# Patient Record
Sex: Female | Born: 1945 | Race: White | Hispanic: No | Smoking: Former smoker
Health system: Southern US, Community
[De-identification: ages and names within clinical notes are randomized; demographics above are authoritative.]

## PROBLEM LIST (undated history)

## (undated) DIAGNOSIS — R112 Nausea with vomiting, unspecified: Secondary | ICD-10-CM

## (undated) DIAGNOSIS — K5790 Diverticulosis of intestine, part unspecified, without perforation or abscess without bleeding: Secondary | ICD-10-CM

## (undated) DIAGNOSIS — W19XXXA Unspecified fall, initial encounter: Secondary | ICD-10-CM

## (undated) DIAGNOSIS — F32A Depression, unspecified: Secondary | ICD-10-CM

## (undated) DIAGNOSIS — N281 Cyst of kidney, acquired: Secondary | ICD-10-CM

## (undated) DIAGNOSIS — R911 Solitary pulmonary nodule: Secondary | ICD-10-CM

## (undated) DIAGNOSIS — R0600 Dyspnea, unspecified: Secondary | ICD-10-CM

## (undated) DIAGNOSIS — R296 Repeated falls: Secondary | ICD-10-CM

## (undated) DIAGNOSIS — Z8719 Personal history of other diseases of the digestive system: Secondary | ICD-10-CM

## (undated) DIAGNOSIS — M5137 Other intervertebral disc degeneration, lumbosacral region: Secondary | ICD-10-CM

## (undated) DIAGNOSIS — Z87442 Personal history of urinary calculi: Secondary | ICD-10-CM

## (undated) DIAGNOSIS — K219 Gastro-esophageal reflux disease without esophagitis: Secondary | ICD-10-CM

## (undated) DIAGNOSIS — I4719 Other supraventricular tachycardia: Secondary | ICD-10-CM

## (undated) DIAGNOSIS — G4733 Obstructive sleep apnea (adult) (pediatric): Secondary | ICD-10-CM

## (undated) DIAGNOSIS — M51379 Other intervertebral disc degeneration, lumbosacral region without mention of lumbar back pain or lower extremity pain: Secondary | ICD-10-CM

## (undated) DIAGNOSIS — I82409 Acute embolism and thrombosis of unspecified deep veins of unspecified lower extremity: Secondary | ICD-10-CM

## (undated) DIAGNOSIS — Z87412 Personal history of vulvar dysplasia: Secondary | ICD-10-CM

## (undated) DIAGNOSIS — Z8619 Personal history of other infectious and parasitic diseases: Secondary | ICD-10-CM

## (undated) DIAGNOSIS — C801 Malignant (primary) neoplasm, unspecified: Secondary | ICD-10-CM

## (undated) DIAGNOSIS — E559 Vitamin D deficiency, unspecified: Secondary | ICD-10-CM

## (undated) DIAGNOSIS — F329 Major depressive disorder, single episode, unspecified: Secondary | ICD-10-CM

## (undated) DIAGNOSIS — Z9889 Other specified postprocedural states: Secondary | ICD-10-CM

## (undated) DIAGNOSIS — I499 Cardiac arrhythmia, unspecified: Secondary | ICD-10-CM

## (undated) DIAGNOSIS — R569 Unspecified convulsions: Secondary | ICD-10-CM

## (undated) DIAGNOSIS — Z8669 Personal history of other diseases of the nervous system and sense organs: Secondary | ICD-10-CM

## (undated) DIAGNOSIS — I471 Supraventricular tachycardia: Secondary | ICD-10-CM

## (undated) DIAGNOSIS — K589 Irritable bowel syndrome without diarrhea: Secondary | ICD-10-CM

## (undated) DIAGNOSIS — M1990 Unspecified osteoarthritis, unspecified site: Secondary | ICD-10-CM

## (undated) DIAGNOSIS — Z8711 Personal history of peptic ulcer disease: Secondary | ICD-10-CM

## (undated) DIAGNOSIS — Q851 Tuberous sclerosis: Secondary | ICD-10-CM

## (undated) DIAGNOSIS — I10 Essential (primary) hypertension: Secondary | ICD-10-CM

## (undated) DIAGNOSIS — I491 Atrial premature depolarization: Secondary | ICD-10-CM

## (undated) HISTORY — DX: Malignant (primary) neoplasm, unspecified: C80.1

## (undated) HISTORY — DX: Personal history of urinary calculi: Z87.442

## (undated) HISTORY — DX: Supraventricular tachycardia: I47.1

## (undated) HISTORY — DX: Tuberous sclerosis: Q85.1

## (undated) HISTORY — PX: UPPER GASTROINTESTINAL ENDOSCOPY: SHX188

## (undated) HISTORY — DX: Other supraventricular tachycardia: I47.19

## (undated) HISTORY — PX: DILATION AND CURETTAGE OF UTERUS: SHX78

## (undated) HISTORY — DX: Gastro-esophageal reflux disease without esophagitis: K21.9

## (undated) HISTORY — PX: HEMORRHOID SURGERY: SHX153

## (undated) HISTORY — PX: COLONOSCOPY: SHX174

## (undated) HISTORY — DX: Diverticulosis of intestine, part unspecified, without perforation or abscess without bleeding: K57.90

## (undated) HISTORY — PX: CATARACT EXTRACTION W/ INTRAOCULAR LENS  IMPLANT, BILATERAL: SHX1307

## (undated) HISTORY — DX: Atrial premature depolarization: I49.1

## (undated) HISTORY — PX: GYNECOLOGIC CRYOSURGERY: SHX857

## (undated) HISTORY — PX: AUGMENTATION MAMMAPLASTY: SUR837

## (undated) HISTORY — DX: Unspecified osteoarthritis, unspecified site: M19.90

---
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
## 1973-06-03 HISTORY — PX: VAGINAL HYSTERECTOMY: SUR661

## 1983-06-04 HISTORY — PX: LUMBAR DISC SURGERY: SHX700

## 1989-06-03 HISTORY — PX: BLADDER SURGERY: SHX569

## 1997-10-17 ENCOUNTER — Other Ambulatory Visit: Admission: RE | Admit: 1997-10-17 | Discharge: 1997-10-17 | Payer: Self-pay | Admitting: Gynecology

## 1997-11-02 ENCOUNTER — Other Ambulatory Visit: Admission: RE | Admit: 1997-11-02 | Discharge: 1997-11-02 | Payer: Self-pay | Admitting: Gynecology

## 1997-11-02 ENCOUNTER — Emergency Department (HOSPITAL_COMMUNITY): Admission: EM | Admit: 1997-11-02 | Discharge: 1997-11-02 | Payer: Self-pay | Admitting: Emergency Medicine

## 1997-11-08 ENCOUNTER — Ambulatory Visit (HOSPITAL_COMMUNITY): Admission: RE | Admit: 1997-11-08 | Discharge: 1997-11-08 | Payer: Self-pay | Admitting: Internal Medicine

## 1997-12-22 ENCOUNTER — Emergency Department (HOSPITAL_COMMUNITY): Admission: EM | Admit: 1997-12-22 | Discharge: 1997-12-22 | Payer: Self-pay | Admitting: Emergency Medicine

## 1998-04-18 ENCOUNTER — Ambulatory Visit (HOSPITAL_COMMUNITY): Admission: RE | Admit: 1998-04-18 | Discharge: 1998-04-18 | Payer: Self-pay

## 1998-09-15 ENCOUNTER — Encounter: Payer: Self-pay | Admitting: Gynecology

## 1998-09-15 ENCOUNTER — Ambulatory Visit (HOSPITAL_COMMUNITY): Admission: RE | Admit: 1998-09-15 | Discharge: 1998-09-15 | Payer: Self-pay | Admitting: Gynecology

## 1998-10-02 ENCOUNTER — Encounter: Payer: Self-pay | Admitting: Gynecology

## 1998-10-02 ENCOUNTER — Ambulatory Visit (HOSPITAL_COMMUNITY): Admission: RE | Admit: 1998-10-02 | Discharge: 1998-10-02 | Payer: Self-pay | Admitting: Gynecology

## 1999-07-09 ENCOUNTER — Encounter: Payer: Self-pay | Admitting: Emergency Medicine

## 1999-07-09 ENCOUNTER — Inpatient Hospital Stay (HOSPITAL_COMMUNITY): Admission: EM | Admit: 1999-07-09 | Discharge: 1999-07-10 | Payer: Self-pay | Admitting: Emergency Medicine

## 1999-07-10 ENCOUNTER — Encounter (HOSPITAL_BASED_OUTPATIENT_CLINIC_OR_DEPARTMENT_OTHER): Payer: Self-pay | Admitting: Internal Medicine

## 1999-10-15 ENCOUNTER — Ambulatory Visit (HOSPITAL_COMMUNITY): Admission: RE | Admit: 1999-10-15 | Discharge: 1999-10-15 | Payer: Self-pay | Admitting: Internal Medicine

## 1999-10-15 ENCOUNTER — Encounter (HOSPITAL_BASED_OUTPATIENT_CLINIC_OR_DEPARTMENT_OTHER): Payer: Self-pay | Admitting: Internal Medicine

## 2000-12-30 ENCOUNTER — Ambulatory Visit (HOSPITAL_COMMUNITY): Admission: RE | Admit: 2000-12-30 | Discharge: 2000-12-30 | Payer: Self-pay | Admitting: Gynecology

## 2000-12-30 ENCOUNTER — Encounter: Payer: Self-pay | Admitting: Gynecology

## 2001-04-22 ENCOUNTER — Ambulatory Visit (HOSPITAL_COMMUNITY): Admission: RE | Admit: 2001-04-22 | Discharge: 2001-04-22 | Payer: Self-pay | Admitting: Neurology

## 2001-04-22 ENCOUNTER — Encounter: Payer: Self-pay | Admitting: Neurology

## 2001-06-17 ENCOUNTER — Ambulatory Visit (HOSPITAL_COMMUNITY): Admission: RE | Admit: 2001-06-17 | Discharge: 2001-06-17 | Payer: Self-pay | Admitting: Cardiology

## 2001-06-17 ENCOUNTER — Encounter: Payer: Self-pay | Admitting: Cardiology

## 2001-06-26 ENCOUNTER — Ambulatory Visit (HOSPITAL_COMMUNITY): Admission: RE | Admit: 2001-06-26 | Discharge: 2001-06-26 | Payer: Self-pay | Admitting: Cardiology

## 2001-06-26 HISTORY — PX: CARDIAC CATHETERIZATION: SHX172

## 2001-08-13 ENCOUNTER — Other Ambulatory Visit: Admission: RE | Admit: 2001-08-13 | Discharge: 2001-08-13 | Payer: Self-pay | Admitting: Gynecology

## 2001-11-03 ENCOUNTER — Encounter: Payer: Self-pay | Admitting: Internal Medicine

## 2001-11-03 ENCOUNTER — Encounter: Admission: RE | Admit: 2001-11-03 | Discharge: 2001-11-03 | Payer: Self-pay | Admitting: Internal Medicine

## 2002-09-27 ENCOUNTER — Other Ambulatory Visit: Admission: RE | Admit: 2002-09-27 | Discharge: 2002-09-27 | Payer: Self-pay | Admitting: Internal Medicine

## 2003-05-11 ENCOUNTER — Encounter: Admission: RE | Admit: 2003-05-11 | Discharge: 2003-05-11 | Payer: Self-pay | Admitting: Internal Medicine

## 2003-07-19 ENCOUNTER — Ambulatory Visit (HOSPITAL_COMMUNITY): Admission: RE | Admit: 2003-07-19 | Discharge: 2003-07-19 | Payer: Self-pay | Admitting: Gastroenterology

## 2003-08-18 ENCOUNTER — Encounter: Admission: RE | Admit: 2003-08-18 | Discharge: 2003-08-18 | Payer: Self-pay | Admitting: Gastroenterology

## 2003-09-02 ENCOUNTER — Encounter: Admission: RE | Admit: 2003-09-02 | Discharge: 2003-09-02 | Payer: Self-pay | Admitting: Gastroenterology

## 2003-09-05 ENCOUNTER — Encounter (INDEPENDENT_AMBULATORY_CARE_PROVIDER_SITE_OTHER): Payer: Self-pay | Admitting: *Deleted

## 2003-09-05 ENCOUNTER — Ambulatory Visit (HOSPITAL_COMMUNITY): Admission: RE | Admit: 2003-09-05 | Discharge: 2003-09-05 | Payer: Self-pay | Admitting: Gastroenterology

## 2003-11-07 ENCOUNTER — Encounter (INDEPENDENT_AMBULATORY_CARE_PROVIDER_SITE_OTHER): Payer: Self-pay | Admitting: *Deleted

## 2003-11-07 ENCOUNTER — Ambulatory Visit (HOSPITAL_COMMUNITY): Admission: RE | Admit: 2003-11-07 | Discharge: 2003-11-07 | Payer: Self-pay | Admitting: Gastroenterology

## 2003-11-18 ENCOUNTER — Observation Stay (HOSPITAL_COMMUNITY): Admission: RE | Admit: 2003-11-18 | Discharge: 2003-11-19 | Payer: Self-pay | Admitting: Surgery

## 2003-11-18 ENCOUNTER — Encounter (INDEPENDENT_AMBULATORY_CARE_PROVIDER_SITE_OTHER): Payer: Self-pay | Admitting: Specialist

## 2003-11-18 HISTORY — PX: CHOLECYSTECTOMY: SHX55

## 2003-12-13 ENCOUNTER — Other Ambulatory Visit: Admission: RE | Admit: 2003-12-13 | Discharge: 2003-12-13 | Payer: Self-pay | Admitting: Gynecology

## 2003-12-18 ENCOUNTER — Inpatient Hospital Stay (HOSPITAL_COMMUNITY): Admission: EM | Admit: 2003-12-18 | Discharge: 2003-12-21 | Payer: Self-pay | Admitting: Emergency Medicine

## 2003-12-18 HISTORY — PX: OTHER SURGICAL HISTORY: SHX169

## 2004-07-24 ENCOUNTER — Encounter: Admission: RE | Admit: 2004-07-24 | Discharge: 2004-07-24 | Payer: Self-pay | Admitting: Gynecology

## 2007-08-25 ENCOUNTER — Ambulatory Visit: Payer: Self-pay | Admitting: Cardiovascular Disease

## 2007-09-07 ENCOUNTER — Ambulatory Visit: Payer: Self-pay

## 2007-09-07 ENCOUNTER — Ambulatory Visit: Payer: Self-pay | Admitting: Internal Medicine

## 2007-09-07 ENCOUNTER — Encounter: Payer: Self-pay | Admitting: Cardiovascular Disease

## 2008-05-16 ENCOUNTER — Ambulatory Visit: Payer: Self-pay | Admitting: Gynecology

## 2008-05-16 ENCOUNTER — Encounter: Payer: Self-pay | Admitting: Gynecology

## 2008-05-16 ENCOUNTER — Other Ambulatory Visit: Admission: RE | Admit: 2008-05-16 | Discharge: 2008-05-16 | Payer: Self-pay | Admitting: Gynecology

## 2008-05-25 ENCOUNTER — Ambulatory Visit: Payer: Self-pay | Admitting: Gynecology

## 2008-06-07 ENCOUNTER — Ambulatory Visit: Payer: Self-pay | Admitting: Gynecology

## 2008-08-25 ENCOUNTER — Ambulatory Visit: Payer: Self-pay | Admitting: Gynecology

## 2008-10-28 ENCOUNTER — Emergency Department (HOSPITAL_COMMUNITY): Admission: EM | Admit: 2008-10-28 | Discharge: 2008-10-28 | Payer: Self-pay | Admitting: Emergency Medicine

## 2009-03-02 ENCOUNTER — Ambulatory Visit: Payer: Self-pay | Admitting: Gynecology

## 2009-03-07 ENCOUNTER — Ambulatory Visit: Payer: Self-pay | Admitting: Gynecology

## 2009-03-13 ENCOUNTER — Ambulatory Visit: Payer: Self-pay | Admitting: Gynecology

## 2009-03-17 ENCOUNTER — Ambulatory Visit: Payer: Self-pay | Admitting: Gynecology

## 2009-03-30 ENCOUNTER — Ambulatory Visit: Payer: Self-pay | Admitting: Gynecology

## 2009-03-30 ENCOUNTER — Other Ambulatory Visit: Admission: RE | Admit: 2009-03-30 | Discharge: 2009-03-30 | Payer: Self-pay | Admitting: Gynecology

## 2009-03-30 ENCOUNTER — Encounter: Payer: Self-pay | Admitting: Gynecology

## 2009-04-05 ENCOUNTER — Ambulatory Visit: Payer: Self-pay | Admitting: Gynecology

## 2009-04-06 ENCOUNTER — Encounter: Payer: Self-pay | Admitting: Gynecology

## 2009-04-06 ENCOUNTER — Ambulatory Visit (HOSPITAL_BASED_OUTPATIENT_CLINIC_OR_DEPARTMENT_OTHER): Admission: RE | Admit: 2009-04-06 | Discharge: 2009-04-06 | Payer: Self-pay | Admitting: Gynecology

## 2009-04-06 ENCOUNTER — Ambulatory Visit: Payer: Self-pay | Admitting: Gynecology

## 2009-04-06 HISTORY — PX: OTHER SURGICAL HISTORY: SHX169

## 2009-04-13 ENCOUNTER — Ambulatory Visit: Payer: Self-pay | Admitting: Gynecology

## 2009-05-15 ENCOUNTER — Ambulatory Visit: Payer: Self-pay | Admitting: Gynecology

## 2009-05-23 ENCOUNTER — Ambulatory Visit: Payer: Self-pay | Admitting: Gynecology

## 2009-06-12 ENCOUNTER — Ambulatory Visit: Payer: Self-pay | Admitting: Gynecology

## 2009-08-23 ENCOUNTER — Ambulatory Visit: Payer: Self-pay | Admitting: Gynecology

## 2009-08-30 ENCOUNTER — Ambulatory Visit: Payer: Self-pay | Admitting: Gynecology

## 2009-12-12 ENCOUNTER — Ambulatory Visit: Payer: Self-pay | Admitting: Gynecology

## 2009-12-21 ENCOUNTER — Ambulatory Visit: Payer: Self-pay | Admitting: Gynecology

## 2010-03-16 ENCOUNTER — Other Ambulatory Visit: Admission: RE | Admit: 2010-03-16 | Discharge: 2010-03-16 | Payer: Self-pay | Admitting: Gynecology

## 2010-03-16 ENCOUNTER — Ambulatory Visit: Payer: Self-pay | Admitting: Gynecology

## 2010-05-23 ENCOUNTER — Ambulatory Visit: Payer: Self-pay | Admitting: Gynecology

## 2010-07-05 ENCOUNTER — Ambulatory Visit (INDEPENDENT_AMBULATORY_CARE_PROVIDER_SITE_OTHER): Payer: Self-pay | Admitting: Gynecology

## 2010-07-05 DIAGNOSIS — R5383 Other fatigue: Secondary | ICD-10-CM

## 2010-07-05 DIAGNOSIS — N766 Ulceration of vulva: Secondary | ICD-10-CM

## 2010-07-05 DIAGNOSIS — R5381 Other malaise: Secondary | ICD-10-CM

## 2010-09-05 LAB — GLUCOSE, CAPILLARY: Glucose-Capillary: 123 mg/dL — ABNORMAL HIGH (ref 70–99)

## 2010-09-11 LAB — CBC
MCHC: 34.6 g/dL (ref 30.0–36.0)
Platelets: 191 10*3/uL (ref 150–400)
WBC: 7.9 10*3/uL (ref 4.0–10.5)

## 2010-09-11 LAB — DIFFERENTIAL
Basophils Relative: 0 % (ref 0–1)
Eosinophils Absolute: 0.3 10*3/uL (ref 0.0–0.7)
Lymphocytes Relative: 24 % (ref 12–46)
Lymphs Abs: 1.9 10*3/uL (ref 0.7–4.0)
Neutro Abs: 5.1 10*3/uL (ref 1.7–7.7)
Neutrophils Relative %: 65 % (ref 43–77)

## 2010-09-11 LAB — COMPREHENSIVE METABOLIC PANEL
ALT: 19 U/L (ref 0–35)
Albumin: 3.5 g/dL (ref 3.5–5.2)
Glucose, Bld: 91 mg/dL (ref 70–99)
Potassium: 4.4 mEq/L (ref 3.5–5.1)
Total Protein: 6.4 g/dL (ref 6.0–8.3)

## 2010-09-11 LAB — ACETAMINOPHEN LEVEL: Acetaminophen (Tylenol), Serum: 36.6 ug/mL — ABNORMAL HIGH (ref 10–30)

## 2010-09-11 LAB — POCT CARDIAC MARKERS

## 2010-10-16 NOTE — Assessment & Plan Note (Signed)
[facility]                            CARDIOLOGY OFFICE NOTE   NAME:[name], [name]                     MRN:          [id number]  DATE:08/25/2007                            DOB:          1945-07-25    Ms. [name] is a pleasant 65 year old patient referred by Dr. [name]  for chest pain and dyspnea.   The patient has had increasing chest pain and dyspnea over the last  month or so. She was recently seen in the ER. She ruled out for  myocardial infarction and had a normal chest x-ray.   Her pain is atypical.  It is constant.  It is a sharp pain near the  subxiphoid process. It can radiate around to the left breast. Her  dyspnea seems functional.  There has been no pleuritic component.  No  history of DVT, no pneumonia or fever.   She is concerned about her diagnosis of tuberosclerosis and possible  heart failure in the family.  She has not had any previous cardiac  problems or fluid overload. There has been no palpitations.   She feels that both her chest pain and dyspnea have been progressive.  There is nothing she can do to make them better.   There is no associated diaphoresis, syncope or palpitations.   The patient's review of systems is otherwise negative.   PAST MEDICAL HISTORY:  Remarkable for tuberosclerosis.  This seems to  have primarily involved external lesions, particularly in the hands.  Hysterectomy, bladder resection, lower back surgery in 1985.   She has some lower extremity edema and is on diuretics.   The patient's past medical history is otherwise remarkable for a history  of reflux, seasonal allergies and some chronic fatigue.   Mother is alive at age 19.  Father died at age 93 in a hunting accident.   The patient is allergic to SULFA. Her only medications include  alprazolam 0.05 mg 3 times a day and triamterene hydrochlorothiazide  37.5/25.   The patient is married with two children.  She had previously smoked  but  has not done so since 1998.  She thinks two cups of coffee a day.  She  does some walking but is fairly sedentary.  She does not drink.   PHYSICAL EXAMINATION:  Remarkable for a somewhat anxious white female in  no distress.  Her blood pressure is 130/80, pulse is 63 and regular, respiratory rate  14, afebrile.  HEENT:  Unremarkable.  Carotids are normal without bruit, no  lymphadenopathy, thyromegaly or JVP elevation.  LUNGS:  Clear, good diaphragmatic motion.  No wheezing.  S1, S2 with normal heart sounds. PMI normal.  ABDOMEN:  Protuberant.  There is mild left upper quadrant pain. No  rebound, no bruit, no AAA, no hepatosplenomegaly or hepatojugular  reflux. Distal pulses are intact with trace edema.  NEUROLOGIC:  Nonfocal.  SKIN:  Warm and dry.   EKG is normal.   IMPRESSION:  1. Atypical chest pain. Low likelihood of cardiac disease.  The      patient has had a previous right malleolar fracture. Will follow up  with an adenosine Myoview.  2. Epigastric type pain.  The patient has tuberosclerosis. I think it      may be important to do a chest and abdominal CT to make sure there      is no internal lesions that could be causing some of her pain.      Since her pain seems to spread from the epigastric area to the      chest, we will do both a noncontrast chest and abdominal CT.  3. Lower extremity edema currently stable, low-salt diet.  Continue      current dose of triamterene/hydrochlorothiazide.  4. Anxiety.  Continue alprazolam t.i.d. per Dr. [name].  5. Dyspnea appears functional. Lung exam is clear.  Chest x-ray in the      ER was normal.  CT the chest will rule out other interstitial lung      problems. A 2-D echocardiogram to assess for RV and LV function and      rule out pulmonary hypertension.   So long as the patient's echo and Myoview are normal, she will followup  with Dr. [name] for further follow-up of her tuberosclerosis.     Noralyn Pick. Eden Emms,  MD, [facility]  Electronically Signed    PCN/MedQ  DD: 08/25/2007  DT: 08/25/2007  Job #: [id number]   cc:   [name]. [name], M.D.

## 2010-10-19 NOTE — Cardiovascular Report (Signed)
[place]  Patient:    [name], [name] Visit Number: [id number] MRN: [id number]          Service Type: CAT Location: [place] 2856 01 Attending Physician:  Norman Clay Dictated by:   Darden Palmer., M.D. Proc. Date: 06/26/01 Admit Date:  06/26/2001   CC:         [name]. [name], M.D.   Cardiac Catheterization  HISTORY: The patient is a 65 year old female, who has had severe chest discomfort and exertional dyspnea with significant cardiac anxiety. She has a previous history of poor exercise tolerance and an abnormal treadmill test.  COMMENTS ABOUT PROCEDURE: The patient tolerated the procedure well without complications. Following the procedure, she had good hemostasis and peripheral pulses noted.  HEMODYNAMIC DATA: Aorta post contrast 127/67, LV post contrast 127/11-16.  ANGIOGRAPHIC DATA:  LEFT VENTRICULOGRAM: The left ventriculogram performed in the 30-degree RAO projection.  The aortic valve was normal.  The mitral valve was normal.  The left ventricle appears normal in size.  Estimated ejection fraction was 65-70%. Coronary arteries arise and distribute normally. There is no significant calcification noted.  Left main coronary artery: Normal.  Left anterior descending: Normal and gives rise to two diagonal and septal perforators and extends to the apex.  Intermediate branch: There is a moderate to large size vessel extending to the apex and appears normal.  Circumflex coronary artery: Dominant vessel giving rise to two major marginal vessels and ends in a posterior descending and posterolateral branch and is normal.  Right coronary artery: This is a small nondominant vessel supplying mainly right ventricular branches and appears normal.  IMPRESSION: 1. Normal left ventricular function with normal left ventricular end-diastolic    pressure. 2. Normal coronary arteries with a left dominant  circulation. Dictated by:   Darden Palmer., M.D. Attending Physician:  Norman Clay DD:  06/26/01 TD:  06/26/01 Job: [id number]

## 2010-10-19 NOTE — Op Note (Signed)
NAME:  [name], [name]                        ACCOUNT NO.:  [id number]   MEDICAL RECORD NO.:  [id number]                   PATIENT TYPE:  AMB   LOCATION:  DAY                                  FACILITY:  [facility]   PHYSICIAN:  Abigail Miyamoto, M.D.              DATE OF BIRTH:  [birth year]   DATE OF PROCEDURE:  11/18/2003  DATE OF DISCHARGE:                                 OPERATIVE REPORT   PREOPERATIVE DIAGNOSES:  Biliary dyskinesia.   POSTOPERATIVE DIAGNOSES:  Biliary dyskinesia.   PROCEDURE:  Laparoscopic cholecystectomy with intraoperative cholangiogram.   SURGEON:  Abigail Miyamoto, M.D.   ASSISTANT:  Sharlet Salina T. Hoxworth, M.D.   ANESTHESIA:  General endotracheal anesthesia.   ESTIMATED BLOOD LOSS:  Minimal.   FINDINGS:  The patient was found to have a normal cholangiogram. The  gallbladder was chronically scarred in appearance.   DESCRIPTION OF PROCEDURE:  The patient was brought to the operating room,  identified as [name]. She was placed supine on the operating table  and general anesthesia was induced. Her abdomen was then prepped and draped  in the usual sterile fashion.  Using a #15 blade, a small transverse  incision was made below the umbilicus through a previous scar.  The incision  was carried down through the fascia which was then opened with the scalpel,  a hemostat was then used to pass through the peritoneal cavity. A #0 Vicryl  pursestring suture was then placed around the fascial opening.  The Hasson  port was placed through the opening and insufflation of the abdomen was  begun. A 12 mm port was then placed in the patient's epigastrium and two 5  mm ports were placed in the patient's right flank under direct vision. The  gallbladder was then identified and retracted above the liver bed. Several  adhesions to the gallbladder were then taken down bluntly. The cystic duct  and cystic artery were then easily dissected out. A critical window was  achieved around the cystic duct. The artery was clipped twice proximally,  once distally and transected. The duct was then clipped once distally and  partly opened with the laparoscopic scissors. A cholangiocatheter was  inserted under direct vision in the right upper quadrant. The  cholangiocatheter was passed through this and placed into the opening in the  cystic duct. A cholangiogram was then performed under direct fluoroscopy.  Contrast was seen to flow easily into the common bile duct, the entire  biliary system and duodenum without evidence of obstruction or abnormality.  The Cholangiocath was then completely removed. The cystic duct was clipped  three times proximally and completely transected.  The gallbladder was then  slowly dissected free from the liver bed with the electrocautery. Once it  was free from the liver bed, it was removed through the incision at the  umbilicus. The liver bed was gain examined and hemostasis felt to be  achieved. The #0  Vicryl at the umbilicus was then tied in place closing the  fascia defect after the abdomen was irrigated with saline. All ports were  removed under direct vision and the abdomen was deflated.  All incisions  were anesthetized with 0.25% Marcaine and closed with  4-0 Monocryl subcuticular sutures. Steri-Strips, gauze and tape were then  applied. The patient tolerated the procedure well. All  sponge, needle and  instrument counts were correct at the end of the procedure. The patient was  then extubated in the operating room and taken in stable condition to the  recovery room.                                               Abigail Miyamoto, M.D.    DB/MEDQ  D:  11/18/2003  T:  11/18/2003  Job:  [id number]   cc:   Anselmo Rod, M.D.  [address]  [address]  [place]  [place] [address]  Fax: [phone]

## 2010-10-19 NOTE — Op Note (Signed)
NAME:  [name], [name]                        ACCOUNT NO.:  [id number]   MEDICAL RECORD NO.:  [id number]                   PATIENT TYPE:  AMB   LOCATION:  ENDO                                 FACILITY:  [facility]   PHYSICIAN:  Anselmo Rod, M.D.               DATE OF BIRTH:  12/17/1945   DATE OF PROCEDURE:  09/05/2003  DATE OF DISCHARGE:                                 OPERATIVE REPORT   PROCEDURE PERFORMED:  Esophagogastroduodenoscopy.   ENDOSCOPIST:  Charna Elizabeth, M.D.   INSTRUMENT USED:  Olympus video panendoscope.   INDICATIONS FOR PROCEDURE:  The patient is a 65 year old white female with a  history of tubular sclerosis with severe abdominal pain, nausea and  vomiting.  Rule out peptic ulcer disease, esophagitis, gastritis, etc.   PREPROCEDURE PREPARATION:  Informed consent was procured from the patient.  The patient was fasted for eight hours prior to the procedure.   PREPROCEDURE PHYSICAL:  The patient had stable vital signs.  Neck supple,  chest clear to auscultation.  S1, S2 regular.  Abdomen soft with normal  bowel sounds.   DESCRIPTION OF PROCEDURE:  The patient was placed in the left lateral  decubitus position and sedated with Demerol and Versed.  Once the patient  was adequately sedated and maintained on low-flow oxygen and continuous  cardiac monitoring, the Olympus video panendoscope was advanced through the  mouth piece over the tongue into the esophagus under direct vision.  The  entire esophagus appeared normal with no evidence of ring, stricture,  masses, esophagitis or Barrett's mucosa.  The scope was then advanced to the  stomach.  The entire gastric mucosa appeared normal.  Some nodules were seen  in the duodenal bulb.  These may represent lymphoid hyperplasia; however,  they did not seem typical of lymphoid hyperplasia and therefore biopsies  were done for pathology.  Small bowel distal to the bulb up to 60 cm  appeared normal.  There was no outlet  obstruction.  The patient tolerated  the procedure well without complication.  Retroflexion in the high cardia  revealed no abnormalities, no ulcers, erosions, masses or polyps were seen.   IMPRESSION:  Small nodular lesion in duodenal bulb biopsied for pathology.  Otherwise normal esophagogastroduodenoscopy.   RECOMMENDATIONS:  1. Await pathology results.  2. Outpatient followup in the next two weeks for further recommendations.                                               Anselmo Rod, M.D.    JNM/MEDQ  D:  09/05/2003  T:  09/06/2003  Job:  [id number]   cc:   [name]. [name], M.D.  [address]  [place]  [place] [address]  Fax: [phone]

## 2010-10-19 NOTE — Consult Note (Signed)
[place]  Patient:    [name]                   MRN: [id number] Proc. Date: 07/09/99 Adm. Date:  [id number] Disc. Date: [id number] Attending:  Garlan Fillers CC:         Dossie Arbour, M.D., Medicine                          Consultation Report  Thank you for asking me to see this 65 year old female for evaluation of chest discomfort.  She is a very difficult historian.  She has a long-standing history of fibromyalgia and anxiety, some depression.  She has had episodic chest discomfort over the years.  In 1991, she had chest discomfort that she was taken to the emergency room at [facility] with.  It was associated with vomiting and then severe diffuse anterior chest pain.  She later had some chest discomfort, which was progressive over the years and in 1998, had a treadmill test, for which she only went 4 minutes and had a millimeter of rapid ______ in ST segment depression. he was seen by Dr. Nicki Guadalajara and underwent cardiac catheterization at [facility] with findings of normal coronary arteries and normal left ventricular function.  She takes Vicodin for pain that she describes as fibromyalgia.  She as been obese and gets little in the way of exercise.  She states that she has had episodic dyspnea for a couple of days now.  She developed right arm pain, which hurt the entirety of her right arm throughout the evening last night.  She went to work this morning complaining of some vague right arm pain and while eating lunch, had the onset of anterior substernal chest discomfort that she describes as an elephant sitting on her chest associated with moderate nausea.  She went home and was brought here by her husband and was given a nitroglycerin, which she said relieved the pain.   EKG was normal during chest pain.  She complains of vague right arm at this time.  PAST MEDICAL HISTORY:  Medical:  No history of  hypertension or diabetes.  She thinks her cholesterol is low.  PAST SURGICAL HISTORY: 1. Hysterectomy. 2. Hemorrhoidectomy. 3. Back surgery x 2. 4. Bladder reconstruction.  ALLERGIES:  SULFA.  CURRENT MEDICATIONS: 1. Effexor. 2. Vicodin. 3. Some sort of a sleeping pill. 4. Prevacid p.r.n.  FAMILY HISTORY:  Her father died of suicide.  Mother reportedly had an myocardial infarction at age 35.  She has a history of heart disease on her mothers side of the family.  SOCIAL HISTORY:  She currently works in Clinical biochemist at [employer].  This is her  second marriage.  She has been married for 10 years to her current husband, who has had an amputation because of diabetes and smoking.  She smoked remotely in the past, but quit greater than five years ago.  Does not use alcohol to excess.  REVIEW OF SYSTEMS:  Obese for many years.  She has gained significant amounts of weight.  No ENT problems.  She complains of occasional dysphagia.  She has been  told that she has not had an ulcer in the past by Dr. Virginia Rochester.  She has occasional urinary incontinence.  She had epilepsy previously.  She has pain involving her  feet and her arms for which she will take Vicodin, which has been attributed to  fibromyalgia.  PHYSICAL EXAMINATION:  On examination, she is a pale, obese female who is currently in no acute distress.  VITAL SIGNS:  Blood pressure currently 130/80, pulse 70.  Skin:  Warm and dry.  ENT:  Normal. Pharynx is negative.  Neck:  Supple without masses.  No thyromegaly or carotid bruits.  Lungs:  Clear to auscultation and percussion.  Cardiovascular:  Normal S1, S2, no S3, no S4.  Abdomen:  Soft and nontender.  Extremities:  Femoral pulses are 2+ and pedal pulses are present and are 2+. There is no edema noted.  12-lead ECG is within normal limits.  Chest x-ray is normal.  Lab data shows normal PT and PTT.  Chemistry panel shows a sodium 137, potassium 4.1, glucose  118. CPK was normal with a normal MB and troponin was 0.04.   Hemoglobin 14.3, hematocrit 41.  IMPRESSION: 1. Prolonged episode of chest discomfort associated with nausea in a patient who had a normal cath two years ago.  Her EKG was normal during pain also.  I think it is unlikely that this was an ischemic event, although she was relieved with nitroglycerin.  Possibilities could include coronary spasm, but the fact that the EKG did not show ST elevation is against this.  I also think that esophageal spasm could be considered a possibility or possibly gallbladder disease.  2. Fibromyalgia.  3. Anxiety and depression.  4. Obesity.  5. History of tuberous sclerosis.  RECOMMENDATIONS:  At the present time, I do not know that she really needs further cardiac workup unless you would like to rule her out for an myocardial infarction with enzymes.  I would consider placing her on acid suppressive therapy and getting a GI evaluation on her. Consider chronic Prevacid or Prilosec therapy.  I appreciate seeing her with you. DD:  07/09/99 TD:  07/09/99 Job: [id number]

## 2010-10-19 NOTE — Op Note (Signed)
NAME:  [name], [name]                        ACCOUNT NO.:  [id number]   MEDICAL RECORD NO.:  [id number]                   PATIENT TYPE:  AMB   LOCATION:  ENDO                                 FACILITY:  [facility]   PHYSICIAN:  Anselmo Rod, M.D.               DATE OF BIRTH:  Aug 30, 1945   DATE OF PROCEDURE:  11/07/2003  DATE OF DISCHARGE:                                 OPERATIVE REPORT   PROCEDURE:  Screening colonoscopy.   ENDOSCOPIST:  Charna Elizabeth, M.D.   INSTRUMENT USED:  Olympus video colonoscope.   INDICATIONS FOR PROCEDURE:  65 year old white female with a history of  rectal bleeding and Paget's disease undergoing screening colonoscopy to rule  out colonic polyps, masses, etc.   PREPROCEDURE PREPARATION:  Informed consent was obtained from the patient.  The patient was fasted for eight hours prior to the procedure and prepped  with a bottle of magnesium citrate and a gallon of GoLYTELY the night prior  to the procedure.   PREPROCEDURE PHYSICAL:  Patient with stable vital signs.  Neck supple.  Chest clear to auscultation.  S1 and S2 regular.  Abdomen soft with normal  bowel sounds.   DESCRIPTION OF PROCEDURE:  The patient was placed in the left lateral  decubitus position, sedated with 60 mg of Demerol and 6 mg Versed in slow  incremental doses.  Once the patient was adequately sedated, maintained on  low flow oxygen and continuous cardiac monitoring, the Olympus video  colonoscope was advanced into the rectum to the cecum.  There were scattered  diverticula throughout the colon.  No masses or polyps were seen.  Small  internal hemorrhoids were appreciated on retroflexion of the rectum.  There  was some residual stool, small lesions could have been missed.   IMPRESSION:  1. Scattered diverticulosis.  2. Small internal hemorrhoids.  3. No masses or polyps seen.  4. Residual stool in the colon, small lesions could have been missed.   RECOMMENDATIONS:  1. Continue a  high fiber diet with liberal fluid intake.  2. Repeat colonoscopy in the next five years unless the patient develops any     abnormal symptoms in the interim.  3. Outpatient follow up in the next two weeks for further recommendations.                                              Anselmo Rod, M.D.   JNM/MEDQ  D:  11/07/2003  T:  11/07/2003  Job:  [id number]   cc:   Ivery Quale, M.D.
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
## 2010-10-19 NOTE — H&P (Signed)
[place]. [facility]  Patient:    [name]                   MRN: [id number] Adm. Date:  [id number] Disc. Date: [id number] Attending:  Garlan Fillers CC:         Darden Palmer., M.D.             Sabino Gasser, M.D.             Juan H. Lily Peer, M.D.             Dr. Johnell Comings, [place], [place]                         History and Physical  CHIEF COMPLAINT:  Chest pressure "like an elephant on my chest."  HISTORY OF PRESENT ILLNESS:  The patient is a 65 year old white female who is well known to me.  Last night she began to have mild shortness of breath, associated  with right shoulder pain.  Today while eating lunch she developed fairly rapid onset of substernal chest pain associated with nausea, but no vomiting.  The intensity of the chest pain was up to 9-1/2 out of 10 and it was associated with weakness and shortness of breath.  A nitroglycerin sublingual tablet given in the emergency room was associated with a decrease in this pain.  The pain lasted a total of several hours.  Currently she is comfortable and at her baseline.  PAST MEDICAL HISTORY:  Extensive and includes fibromyalgia, chronic fatigue, multiple arthralgias, irritable bowel syndrome, peptic ulcer disease with gastroesophageal reflux disease, atypical chest pain with an October 1998 cardiac catheterization showing normal coronary arteries and normal left ventricular systolic function, weight gain of approximately 100 pounds in the past 2 years,  with an April 2000 TSH level of 1.48 and 24-hour urine free cortisol levels within normal limits, chronic insomnia, depression, tuberous sclerosis with a history f childhood epilepsy.  CURRENT MEDICATIONS:  1. Prevacid 1 tab p.o. q.d.  2. Vicodin p.r.n.  3. Effexor XR 75 mg p.o. in a.m.  4. Ambien 10 mg p.o. q.h.s. p.r.n. sleep.  PAST SURGICAL HISTORY:  Significant for 1975 total vaginal hysterectomy; 1981, hemorrhoids; 1985,  bilateral breast implants (saline); 1984, lumbar spine diskectomy; 1992, bladder reconstruction.  ALLERGIES:  SULFA DRUGS.  FAMILY HISTORY:  Father died at age 81 of suicide associated with a chronic alcoholism.  Mother is age 73 years and has osteoarthritis, had coronary artery  disease in her early 42s, and has hypertension.  She has a half sister who has mental retardation.  There is no family history of colon cancer or breast cancer. There is a family history of diabetes mellitus in a grandfather and premature coronary artery disease in her mother.  SOCIAL HISTORY:  She has been married for 10 years and has two children (a son ge 30 years and a son age 55 years).  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  She was a cigarette smoker until 1995 and she admits to a small amount of alcohol consumption.  REVIEW OF SYSTEMS:  Significant for gradual weight gain of approximately 100 pounds in the last two years and chronic fatigue with chronic neck pain.  She denies recent fever.  Has had occasional headaches.  She denies constipation or diarrhea, but has had a few episodes of a small amount of bright red blood in  her stools.  She has had no recent seizures.  PHYSICAL EXAMINATION:  VITAL SIGNS:  Blood pressure 146/70, pulse 76, respiratory rate 20, temperature  97.9.  GENERAL:  She is an overweight white female in no apparent distress who is alert and oriented x 4.  HEENT:  Pupils are equal, round, and reactive to light and accommodation. Extraocular movements are intact.  Fundi were benign.  NECK:  Supple with a full range of motion.  There was no jugular venous distention. No carotid bruit.  CHEST:  Clear to auscultation.  HEART:  Regular rate and rhythm, S1 and S2, without murmur, gallop, or rub.  ABDOMEN:  Normal bowel sounds with no hepatosplenomegaly or tenderness.  EXTREMITIES:  Without cyanosis, clubbing, or  edema.  NEUROLOGIC:  She was alert and oriented x 4.  Cranial nerves II-XII are intact.  Sensory exam was within normal limits.  Motor strength was 5/5 throughout. Cerebellar testing was intact.  Gait was not assessed.  LABORATORY STUDIES:  White blood cell count 4.8, hemoglobin 14.3, hematocrit 41, platelet count 194.  Serum sodium 137, potassium 4.1, chloride 105, CO2 29, BUN 8, creatinine 0.7, glucose 118, CPK 71, CPK-MB 1.4.  EKG showed:  1. Normal sinus rhythm.  2. Low precordial R wave amplitude.  A chest x-ray (AP) showed no acute cardiopulmonary disease.  IMPRESSION:  1. Chest pain.  This is somewhat atypical for coronary artery disease.  It is ore     likely secondary to cholelithiasis or gastroesophageal reflux disease or     esophageal spasm.  I doubt that she has has Prinzmentals angina given her     recent normal cardiac catheterization.  2. Weight gain with an extensive work-up which was unremarkable.  It is likely     that the weight gain is of exogenous etiology.  3. Hematochezia likely secondary to internal hemorrhoids.  I doubt that she has a     rectal carcinoma.  An air contrast barium enema had been planned as an     outpatient to be followed by a flexible sigmoidoscopy.  PLAN:  1. Rule out myocardial infarction by serial cardiac isoenzymes while monitoring     her on a telemetry ward.  A low dose beta blocker treatment will be added to     her regimen.  2. Right upper quadrant ultrasound to rule out possible cholelithiasis.  3. Consider barium swallow as an outpatient.  I appreciate the evaluate today by Dr. Donnie Aho. DD:  07/09/99 TD:  07/09/99 Job: [id number]

## 2010-10-19 NOTE — Op Note (Signed)
NAME:  [name], [name]                        ACCOUNT NO.:  [id number]   MEDICAL RECORD NO.:  [id number]                   PATIENT TYPE:  INP   LOCATION:  0470                                 FACILITY:  [facility]   PHYSICIAN:  Kerrin Champagne, M.D.                DATE OF BIRTH:  August 28, 1945   DATE OF PROCEDURE:  12/18/2003  DATE OF DISCHARGE:                                 OPERATIVE REPORT   PREOPERATIVE DIAGNOSIS:  Right closed bimalleolar ankle fracture.   POSTOPERATIVE DIAGNOSIS:  Right closed bimalleolar ankle fracture.   PROCEDURE:  Open reduction and internal fixation of right bimalleolar ankle  fracture with a 3.5 lag screw and six-hole, one-third 3.5 semitubular plate  to the lateral malleolus and a single 4.0 lag screw with washer to the  medial malleolus.   SURGEON:  Kerrin Champagne, M.D.   ASSISTANT:  Maud Deed, P.A.-C.   ANESTHESIA:  Camie Patience, Hezzie Bump. Rose, M.D.   ESTIMATED BLOOD LOSS:  75 mL.   DRAINS:  Foley to straight drain.   BRIEF CLINICAL HISTORY:  The patient is a 65 year old female with past  history of right ankle fracture treated by Ollen Gross, M.D., several  years ago with long-leg cast.  This patient reports that today she was in  her back yard doing some yard work when she slipped, twisting her right  ankle and sustaining a right ankle injury.  X-rays demonstrated a right  bimalleolar ankle fracture with lateral displacement about 4-5 mm, widening  of the mortise evident.  She has a large thigh and quite conical, felt that  closed reduction would not be of much benefit here as a long-leg cast would  not be of much benefit.  The patient is brought to the operating room to  undergo open reduction and internal fixation of the right bimalleolar ankle  fracture.   INTRAOPERATIVE FINDINGS:  The patient had a fracture of the medial malleolus  at a level just below the plafond, allowed only for fixation with a single  4.0 lag screw with washer.  The  lateral malleolus fracture was an oblique  spiral fracture just at the level of the plafond extending proximally and  was fixed well with lag screw and semitubular plate.   DESCRIPTION OF PROCEDURE:  After adequate general anesthesia, a bump under  the right buttock to allow for internal rotation of the right leg, a  tourniquet about the right thigh, standard preoperative antibiotics of  Ancef, standard preop of the right lower extremity, toes to upper thigh with  Duraprep solution, draped in the usual manner.  The right foot wrapped with  two gloves.  The patient then had exsanguination of the right lower  extremity with Esmarch bandage, tourniquet inflated to 300 mmHg.  During the  procedure the tourniquet held fairly well, did have some degree of venous  bleeding laterally and medially.  The incision over the lateral  malleolus in  line with the fibula, superficial portion of the fibula, approximately 12-14  cm in length through the skin and subcutaneous layers down to bone over the  superficial fibula.  The incision over the medial malleolus a curvilinear  incision, flap base proximal and posterior, through the skin and subcu  layers, preserving the saphenous vein and nerve over the anterior aspect of  the incision.  The incision carried through the periosteum and the fracture  site identified, periosteum debrided from the edges of the fracture site.  The joint space opened and irrigated with copious amounts of irrigant  solution, removing any bony or cartilage debris.  Following this, then  attention then turned to the lateral malleolus, where the left foot and leg  internally rotated, seen to reduce the fracture quite nicely.  A reducing  tenaculum was used to hold the fracture site reduced and a metatarsal  retractor placed over the anterior medial aspect of the fibula so that a 3.5  drill bit could be used to drill the anterior cortex of the proximal  fracture fragment oblique at  approximately half the angulation between a  perpendicular to the fracture site and a transverse drill.  This crossed  across the fracture site, capturing both proximal and distal fracture  fragments in an AP plane.  Measured for, actually the 3.5 drill bit was  removed after drilling the superficial cortex.  A top hat sleeve was placed  and a 2.5 drill bit used to drill the deep cortex.  With this, then a depth  gauge was used to measure the depth and the appropriate size screw then  placed, lagging the fracture site in good position and alignment.  A six-  hole one-third semitubular plate fitted snugly against the lateral aspect of  the cortex, providing a slight spring to the distal part of the fibula and  reduction of the ankle mortise.  This was placed such that the midportion of  the six-hole plate was over the fracture site.  Drill holes placed  proximally and three cortical screws used to fix the proximal portion of the  plate to the proximal fragment.  The most distal screw hole was filled with  a posterior to anterior-placed fully-threaded cancellous screw and then the  second to the most distal and third to most distal screw holes were filled  with cortical screws, obtaining excellent purchase on the distal fracture  fragment.  Attention then turned to the medial malleolus, where the fracture  site was reduced and held nicely with a reducing tenaculum, intraoperative C-  arm fluoroscopy used to demonstrate well the plate laterally and screws in  good position, alignment, and reduction of the medial malleolus fracture  fragment.  A division of the deltoid in line with its fibers using  electrocautery so that the tip and distal aspect of the medial malleolus  identified.  A 2.5 drill bit then used to drill initial drill hole into the  distal fragment, crossing the proximal fracture fragment parallel to the  joint surface.  This was extended obliquely from anterior to posterior,  from distal to proximal.  Measured for depth, a 30 mm screw was chosen, tapping  performed using the 4.0 tap, and then a partially-threaded 4.0 cancellous  screw was placed with a washer, as the patient's bone was felt to be  slightly soft.  This fixed the medial malleolus fracture site in anatomic  position and alignment in the AP and lateral oblique planes.  There was no  room left anteriorly or posteriorly for either pin or further screw fixation  so that it is felt that the patient had complete internal fixation.  Permanent C-arm images in AP, lateral, oblique planes were obtained.  Irrigation performed of the medial and lateral incisions.  Lateral incision  closed in the deep area with interrupted 0 Vicryl sutures, more superficial  layers with interrupted 2-0 Vicryl suture, and the skin closed with small  stainless steel staples.  The medial malleolus incision was closed with  irrigation and then the periosteum approximated with interrupted 2-0 Vicryl  sutures, the subcutaneous layers approximated with interrupted 2-0 Vicryl  suture, and the skin closed with stainless steel staples.  Adaptic, 4 x 4's,  ABD pads fixed to the skin with sterile Webril.  A well-padded posterior  short-leg splint was applied, the ankle at 90 degrees.  The tourniquet was  released at the end of the case prior to closure as the patient was showing  swelling and was felt to have some amount of venous tourniquet effect.  Total tourniquet time was 46 minutes.  The patient was then reactivated,  extubated, and returned to the recovery room in satisfactory condition.  All  instrument and sponge counts were correct.                                               Kerrin Champagne, M.D.    Myra Rude  D:  12/18/2003  T:  12/19/2003  Job:  [id number]

## 2010-10-19 NOTE — Discharge Summary (Signed)
NAME:  [name], [name]                        ACCOUNT NO.:  [id number]   MEDICAL RECORD NO.:  [id number]                   PATIENT TYPE:  INP   LOCATION:  0470                                 FACILITY:  [facility]   PHYSICIAN:  Kerrin Champagne, M.D.                DATE OF BIRTH:  June 06, 1945   DATE OF ADMISSION:  12/18/2003  DATE OF DISCHARGE:  12/21/2003                                 DISCHARGE SUMMARY   ADMISSION DIAGNOSES:  1.  Right displaced bimalleolar ankle fracture.  2.  Fibromyalgia.  3.  History of seizure disorder.  4.  Gastroesophageal reflux disease.  5.  History of diverticulitis.  6.  History of kidney stones.  7.  Hypoglycemia.   DISCHARGE DIAGNOSES:  1.  Right displaced bimalleolar ankle fracture.  2.  Fibromyalgia.  3.  History of seizure disorder.  4.  Gastroesophageal reflux disease.  5.  History of diverticulitis.  6.  History of kidney stones.  7.  Hypoglycemia.  8.  Mild posthemorrhagic anemia.   PROCEDURE:  On December 18, 2003, patient underwent open reduction/internal  fixation of right bimalleolar ankle fracture by Dr. Otelia Sergeant, assisted by  Maud Deed, PA-C, under general anesthesia.   CONSULTATIONS:  None.   BRIEF HISTORY:  A 65 year old white female who slipped in her yard,  sustaining a right ankle injury.  She was brought to the emergency room at  [facility], and x-rays demonstrated a right bimalleolar ankle  fracture with lateral displacement and widening of the mortis.  It was felt  that she would require surgical intervention and was brought to the  operating room for the procedure stated above.   BRIEF HOSPITAL COURSE:  Patient tolerated the procedure without  complications.  Postoperatively, she did require morphine for pain control  and utilized a PCA pump.  She was gradually weaned to p.o. analgesics.  Patient was noted to have neurovascular and motor function intact of the  right lower extremity postoperatively.  The patient had  some difficulty with  beginning ambulation, and her Foley catheter remained intact for an  additional 24 hours.  On the second postoperative day, she continued to have  reports of over-sedation and some dizziness with physical therapy.  The PCA  pump was discontinued, and oral analgesics were utilized, which helped with  this to some degree.  She was still somewhat slow to progress with  ambulation.  It was felt that she would need some assistance at home;  therefore, arrangements were made for her to return to her family in  [place], [place] with home-health physical therapy there.  Durable  medical equipment needs were also made available.  Patient had some  tenderness along the rib cage in the right posterior side.  X-rays were  negative for chest x-ray.   Patient was afebrile, and vital signs were stable on December 21, 2003.  She was  ambulating slowly but  able to tolerate, utilizing a walker and did ambulate  as much as 90 feet with assistance by the physical therapist.  She was able  to be discharged on December 21, 2003 in stable condition.   PERTINENT LABORATORY VALUES:  Admission labs included CBC with hemoglobin  12.8, hematocrit 37.4.  Postoperatively, hemoglobin 12.1, hematocrit 35.1.  Coagulation studies on admission were within normal limits with the  exception of PTT of 23.  Chemistries studies on admission within normal  limits.  Postoperatively, potassium dropped to 2.8; however, returned to 3.4  with addition of supplementation to IV fluids.   EKG on admission showed normal EKG, normal sinus rhythm, with no significant  changes since last tracing, confirmed by Dr. Eden Emms.   Preop x-rays of the right ankle showed bimalleolar fracture and subluxation  of the ankle.   Chest x-ray on admission showed no active lung disease.  Postoperative x-ray  of the right ankle showed good appearance following open reduction/internal  fixation.   PLAN:  Patient was discharged to  her home.  Prescriptions were given for  Vicodin ES 1-2 every 4-6 hours as needed for pain, Robaxin 1 every 8 hours  as needed for spasm.  She was advised on strict nonweightbearing to the  right lower extremity and will utilize crutches or walker.  She is advised  to keep her splint clean and dry at all times.  She will resume her regular  diet.  Home health care will assist with physical therapy as well as  occupational therapy.  She will follow up with Dr. Otelia Sergeant two weeks from the  date of her surgery and was advised to call and arrange the appointment.  All questions were encouraged and answered at discharge.     Wende Neighbors, P.A.                    Kerrin Champagne, M.D.    SMV/MEDQ  D:  02/02/2004  T:  02/03/2004  Job:  [id number]

## 2010-11-21 ENCOUNTER — Ambulatory Visit (INDEPENDENT_AMBULATORY_CARE_PROVIDER_SITE_OTHER): Payer: Self-pay

## 2010-11-21 DIAGNOSIS — R197 Diarrhea, unspecified: Secondary | ICD-10-CM

## 2010-12-07 ENCOUNTER — Encounter: Payer: Self-pay | Admitting: Gastroenterology

## 2010-12-10 ENCOUNTER — Ambulatory Visit (AMBULATORY_SURGERY_CENTER): Payer: Self-pay | Admitting: Gastroenterology

## 2010-12-10 ENCOUNTER — Encounter: Payer: Self-pay | Admitting: Gastroenterology

## 2010-12-10 VITALS — BP 128/49 | HR 63 | Temp 98.0°F | Resp 23 | Ht 62.0 in | Wt 203.0 lb

## 2010-12-10 DIAGNOSIS — D126 Benign neoplasm of colon, unspecified: Secondary | ICD-10-CM

## 2010-12-10 DIAGNOSIS — K5730 Diverticulosis of large intestine without perforation or abscess without bleeding: Secondary | ICD-10-CM

## 2010-12-10 DIAGNOSIS — K621 Rectal polyp: Secondary | ICD-10-CM

## 2010-12-10 DIAGNOSIS — K589 Irritable bowel syndrome without diarrhea: Secondary | ICD-10-CM

## 2010-12-10 MED ORDER — SODIUM CHLORIDE 0.9 % IV SOLN: 500.0000 mL | INTRAVENOUS | Status: DC

## 2010-12-10 NOTE — Patient Instructions (Addendum)
Diverticulosis Diverticulosis is a common condition that develops when small pouches (diverticula) form in the wall of the colon. The risk of diverticulosis increases with age. It happens more often in people who eat a low-fiber diet. Most individuals with diverticulosis have no symptoms. Those individuals with symptoms usually experience belly (abdominal) pain, constipation, or loose stools (diarrhea). HOME CARE INSTRUCTIONS  Increase the amount of fiber in your diet as directed by your caregiver or dietician. This may reduce symptoms of diverticulosis.   Your caregiver may recommend taking a dietary fiber supplement.   Drink at least 6 to 8 glasses of water each day to prevent constipation.   Try not to strain when you have a bowel movement.   Your caregiver may recommend avoiding nuts and seeds to prevent complications, although this is still an uncertain benefit.   Only take over-the-counter or prescription medicines for pain, discomfort, or fever as directed by your caregiver.  FOODS HAVING HIGH FIBER CONTENT INCLUDE:  Fruits. Apple, peach, pear, tangerine, raisins, prunes.   Vegetables. Brussels sprouts, asparagus, broccoli, cabbage, carrot, cauliflower, romaine lettuce, spinach, summer squash, tomato, winter squash, zucchini.   Starchy Vegetables. Baked beans, kidney beans, lima beans, split peas, lentils, potatoes (with skin).   Grains. Whole wheat bread, brown rice, bran flake cereal, plain oatmeal, white rice, shredded wheat, bran muffins.  SEEK IMMEDIATE MEDICAL CARE IF:  You develop increasing pain or severe bloating.   You have an oral temperature above 100, not controlled by medicine.   You develop vomiting or bowel movements that are bloody or black.  Document Released: 02/15/2004 Document Re-Released: 11/07/2009 [facility] Patient Information 2011 [place], [place]  .Polyps, Colon  A polyp is extra tissue that grows inside your body. Colon polyps grow in the large  intestine. The large intestine, also called the colon, is part of your digestive system. It is a long, hollow tube at the end of your digestive tract where your body makes and stores stool. Most polyps are not dangerous. They are benign. This means they are not cancerous. But over time, some types of polyps can turn into cancer. Polyps that are smaller than a pea are usually not harmful. But larger polyps could someday become or may already be cancerous. To be safe, doctors remove all polyps and test them.  WHO GETS POLYPS? Anyone can get polyps, but certain people are more likely than others. You may have a greater chance of getting polyps if:  You are over 50.   You have had polyps before.   Someone in your family has had polyps.   Someone in your family has had cancer of the large intestine.   Find out if someone in your family has had polyps. You may also be more likely to get polyps if you:   Eat a lot of fatty foods   Smoke   Drink alcohol   Do not exercise  Eat too much  SYMPTOMS Most small polyps do not cause symptoms. People often do not know they have one until their caregiver finds it during a regular checkup or while testing them for something else. Some people do have symptoms like these:  Bleeding from the anus. You might notice blood on your underwear or on toilet paper after you have had a bowel movement.   Constipation or diarrhea that lasts more than a week.   Blood in the stool. Blood can make stool look black or it can show up as red streaks in the stool.  If  you have any of these symptoms, see your caregiver. HOW DOES THE DOCTOR TEST FOR POLYPS? The doctor can use four tests to check for polyps:  Digital rectal exam. The caregiver wears gloves and checks your rectum (the last part of the large intestine) to see if it feels normal. This test would find polyps only in the rectum. Your caregiver may need to do one of the other tests listed below to find polyps  higher up in the intestine.   Barium enema. The caregiver puts a liquid called barium into your rectum before taking x-rays of your large intestine. Barium makes your intestine look white in the pictures. Polyps are dark, so they are easy to see.   Sigmoidoscopy. With this test, the caregiver can see inside your large intestine. A thin flexible tube is placed into your rectum. The device is called a sigmoidoscope, which has a light and a tiny video camera in it. The caregiver uses the sigmoidoscope to look at the last third of your large intestine.   Colonoscopy. This test is like sigmoidoscopy, but the caregiver looks at all of the large intestine. It usually requires sedation. This is the most common method for finding and removing polyps.  TREATMENT  The caregiver will remove the polyp during sigmoidoscopy or colonoscopy. The polyp is then tested for cancer.   If you have had polyps, your caregiver may want you to get tested regularly in the future.  PREVENTION There is not one sure way to prevent polyps. You might be able to lower your risk of getting them if you:  Eat more fruits and vegetables and less fatty food.   Do not smoke.   Avoid alcohol.   Exercise every day.   Lose weight if you are overweight.   Eating more calcium and folate can also lower your risk of getting polyps. Some foods that are rich in calcium are milk, cheese, and broccoli. Some foods that are rich in folate are chickpeas, kidney beans, and spinach.   Aspirin might help prevent polyps. Studies are under way.  Document Released: 02/14/2004 Document Re-Released: 11/07/2009 [facility] Patient Information 2011 [place], [place].   FOLLOW DISCHARGE INSTRUCTIONS (BLUE & GREEN ) SHEET   INFORMATION ON POLYPS DIVERTICULOSIS AND HIGH FIBER DIET GIVEN.

## 2010-12-10 NOTE — Progress Notes (Signed)
IBS STUDY PATIENT

## 2010-12-11 ENCOUNTER — Telehealth: Payer: Self-pay | Admitting: *Deleted

## 2010-12-11 NOTE — Telephone Encounter (Signed)
No ID on voice mail.   No message left. 

## 2010-12-13 ENCOUNTER — Ambulatory Visit (INDEPENDENT_AMBULATORY_CARE_PROVIDER_SITE_OTHER): Payer: Self-pay

## 2010-12-13 DIAGNOSIS — R197 Diarrhea, unspecified: Secondary | ICD-10-CM

## 2010-12-25 ENCOUNTER — Ambulatory Visit: Payer: Self-pay

## 2011-01-08 ENCOUNTER — Ambulatory Visit: Payer: Self-pay

## 2011-01-08 ENCOUNTER — Ambulatory Visit (INDEPENDENT_AMBULATORY_CARE_PROVIDER_SITE_OTHER): Payer: Self-pay

## 2011-01-08 DIAGNOSIS — R197 Diarrhea, unspecified: Secondary | ICD-10-CM

## 2011-01-09 ENCOUNTER — Ambulatory Visit: Payer: Self-pay

## 2011-01-11 ENCOUNTER — Ambulatory Visit: Payer: Self-pay

## 2011-02-05 ENCOUNTER — Ambulatory Visit: Payer: Self-pay

## 2011-03-06 ENCOUNTER — Ambulatory Visit (INDEPENDENT_AMBULATORY_CARE_PROVIDER_SITE_OTHER): Payer: Self-pay

## 2011-03-06 ENCOUNTER — Ambulatory Visit: Payer: Self-pay

## 2011-03-06 DIAGNOSIS — R197 Diarrhea, unspecified: Secondary | ICD-10-CM

## 2011-05-21 ENCOUNTER — Ambulatory Visit: Payer: Self-pay

## 2011-06-05 ENCOUNTER — Ambulatory Visit: Payer: Self-pay

## 2011-06-12 ENCOUNTER — Ambulatory Visit: Payer: Self-pay

## 2011-06-18 ENCOUNTER — Ambulatory Visit: Payer: Self-pay

## 2011-10-08 ENCOUNTER — Ambulatory Visit: Payer: Self-pay

## 2012-03-25 ENCOUNTER — Encounter: Payer: Self-pay | Admitting: Obstetrics and Gynecology

## 2012-03-30 ENCOUNTER — Encounter: Payer: Self-pay | Admitting: Obstetrics and Gynecology

## 2012-07-06 DIAGNOSIS — M25569 Pain in unspecified knee: Secondary | ICD-10-CM | POA: Diagnosis not present

## 2012-07-06 DIAGNOSIS — R29898 Other symptoms and signs involving the musculoskeletal system: Secondary | ICD-10-CM | POA: Diagnosis not present

## 2012-07-06 DIAGNOSIS — M25559 Pain in unspecified hip: Secondary | ICD-10-CM | POA: Diagnosis not present

## 2012-07-06 DIAGNOSIS — I10 Essential (primary) hypertension: Secondary | ICD-10-CM | POA: Diagnosis not present

## 2012-07-13 DIAGNOSIS — M25569 Pain in unspecified knee: Secondary | ICD-10-CM | POA: Diagnosis not present

## 2012-07-13 DIAGNOSIS — M1710 Unilateral primary osteoarthritis, unspecified knee: Secondary | ICD-10-CM | POA: Diagnosis not present

## 2012-07-14 ENCOUNTER — Encounter: Payer: Self-pay | Admitting: Gynecology

## 2012-07-14 ENCOUNTER — Ambulatory Visit (INDEPENDENT_AMBULATORY_CARE_PROVIDER_SITE_OTHER): Payer: Medicare Other | Admitting: Gynecology

## 2012-07-14 ENCOUNTER — Other Ambulatory Visit: Payer: Self-pay | Admitting: Gynecology

## 2012-07-14 ENCOUNTER — Other Ambulatory Visit (HOSPITAL_COMMUNITY)
Admission: RE | Admit: 2012-07-14 | Discharge: 2012-07-14 | Disposition: A | Discharge status: Home / Self Care | Payer: Medicare Other | Source: Ambulatory Visit | Attending: Gynecology | Admitting: Gynecology

## 2012-07-14 VITALS — BP 140/88 | Ht 61.5 in | Wt 201.0 lb

## 2012-07-14 DIAGNOSIS — Z1272 Encounter for screening for malignant neoplasm of vagina: Secondary | ICD-10-CM | POA: Diagnosis not present

## 2012-07-14 DIAGNOSIS — Z1151 Encounter for screening for human papillomavirus (HPV): Secondary | ICD-10-CM | POA: Insufficient documentation

## 2012-07-14 DIAGNOSIS — Q851 Tuberous sclerosis: Secondary | ICD-10-CM | POA: Diagnosis not present

## 2012-07-14 DIAGNOSIS — E663 Overweight: Secondary | ICD-10-CM | POA: Diagnosis not present

## 2012-07-14 DIAGNOSIS — B029 Zoster without complications: Secondary | ICD-10-CM | POA: Diagnosis not present

## 2012-07-14 DIAGNOSIS — A630 Anogenital (venereal) warts: Secondary | ICD-10-CM | POA: Insufficient documentation

## 2012-07-14 DIAGNOSIS — N951 Menopausal and female climacteric states: Secondary | ICD-10-CM

## 2012-07-14 DIAGNOSIS — Z9882 Breast implant status: Secondary | ICD-10-CM

## 2012-07-14 DIAGNOSIS — E559 Vitamin D deficiency, unspecified: Secondary | ICD-10-CM | POA: Insufficient documentation

## 2012-07-14 DIAGNOSIS — Z1231 Encounter for screening mammogram for malignant neoplasm of breast: Secondary | ICD-10-CM

## 2012-07-14 DIAGNOSIS — Z124 Encounter for screening for malignant neoplasm of cervix: Secondary | ICD-10-CM | POA: Insufficient documentation

## 2012-07-14 DIAGNOSIS — Z8639 Personal history of other endocrine, nutritional and metabolic disease: Secondary | ICD-10-CM

## 2012-07-14 MED ORDER — VALACYCLOVIR HCL 1 G PO TABS: ORAL_TABLET | ORAL | Status: DC

## 2012-07-14 NOTE — Patient Instructions (Addendum)
Shingles Shingles is caused by the same virus that causes chickenpox (varicella zoster virus or VZV). Shingles often occurs many years or decades after having chickenpox. That is why it is more common in adults older than 50 years. The virus reactivates and breaks out as an infection in a nerve root. SYMPTOMS   The initial feeling (sensations) may be pain. This pain is usually described as:  Burning.  Stabbing.  Throbbing.  Tingling in the nerve root.  A red rash will follow in a couple days. The rash may occur in any area of the body and is usually on one side (unilateral) of the body in a band or belt-like pattern. The rash usually starts out as very small blisters (vesicles). They will dry up after 7 to 10 days. This is not usually a significant problem except for the pain it causes.  Long-lasting (chronic) pain is more likely in an elderly person. It can last months to years. This condition is called postherpetic neuralgia. Shingles can be an extremely severe infection in someone with AIDS, a weakened immune system, or with forms of leukemia. It can also be severe if you are taking transplant medicines or other medicines that weaken the immune system. TREATMENT  Your caregiver will often treat you with:  Antiviral drugs.  Anti-inflammatory drugs.  Pain medicines. Bed rest is very important in preventing the pain associated with herpes zoster (postherpetic neuralgia). Application of heat in the form of a hot water bottle or electric heating pad or gentle pressure with the hand is recommended to help with the pain or discomfort. PREVENTION  A varicella zoster vaccine is available to help protect against the virus. The Food and Drug Administration approved the varicella zoster vaccine for individuals 12 years of age and older. HOME CARE INSTRUCTIONS   Cool compresses to the area of rash may be helpful.  Only take over-the-counter or prescription medicines for pain, discomfort, or  fever as directed by your caregiver.  Avoid contact with:  Babies.  Pregnant women.  Children with eczema.  Elderly people with transplants.  People with chronic illnesses, such as leukemia and AIDS.  If the area involved is on your face, you may receive a referral for follow-up to a specialist. It is very important to keep all follow-up appointments. This will help avoid eye complications, chronic pain, or disability. SEEK IMMEDIATE MEDICAL CARE IF:   You develop any pain (headache) in the area of the face or eye. This must be followed carefully by your caregiver or ophthalmologist. An infection in part of your eye (cornea) can be very serious. It could lead to blindness.  You do not have pain relief from prescribed medicines.  Your redness or swelling spreads.  The area involved becomes very swollen and painful.  You have a fever.  You notice any red or painful lines extending away from the affected area toward your heart (lymphangitis).  Your condition is worsening or has changed. Document Released: 05/20/2005 Document Revised: 08/12/2011 Document Reviewed: 04/24/2009 [facility] Patient Information 2013 [place], [place].  Tetanus, Diphtheria, Pertussis (Tdap) Vaccine What You Need to Know WHY GET VACCINATED? Tetanus, diphtheria and pertussis can be very serious diseases, even for adolescents and adults. Tdap vaccine can protect [place] from these diseases. TETANUS (Lockjaw) causes painful muscle tightening and stiffness, usually all over the body.  It can lead to tightening of muscles in the head and neck so you can't open your mouth, swallow, or sometimes even breathe. Tetanus kills about 1 out  of 5 people who are infected. DIPHTHERIA can cause a thick coating to form in the back of the throat.  It can lead to breathing problems, paralysis, heart failure, and death. PERTUSSIS (Whooping Cough) causes severe coughing spells, which can cause difficulty breathing, vomiting and  disturbed sleep.  It can also lead to weight loss, incontinence, and rib fractures. Up to 2 in 100 adolescents and 5 in 100 adults with pertussis are hospitalized or have complications, which could include pneumonia and death. These diseases are caused by bacteria. Diphtheria and pertussis are spread from person to person through coughing or sneezing. Tetanus enters the body through cuts, scratches, or wounds. Before vaccines, the [place] saw as many as 200,000 cases a year of diphtheria and pertussis, and hundreds of cases of tetanus. Since vaccination began, tetanus and diphtheria have dropped by about 99% and pertussis by about 80%. TDAP VACCINE Tdap vaccine can protect adolescents and adults from tetanus, diphtheria, and pertussis. One dose of Tdap is routinely given at age 51 or 61. People who did not get Tdap at that age should get it as soon as possible. Tdap is especially important for health care professionals and anyone having close contact with a baby younger than 12 months. Pregnant women should get a dose of Tdap during every pregnancy, to protect the newborn from pertussis. Infants are most at risk for severe, life-threatening complications from pertussis. A similar vaccine, called Td, protects from tetanus and diphtheria, but not pertussis. A Td booster should be given every 10 years. Tdap may be given as one of these boosters if you have not already gotten a dose. Tdap may also be given after a severe cut or burn to prevent tetanus infection. Your doctor can give you more information. Tdap may safely be given at the same time as other vaccines. SOME PEOPLE SHOULD NOT GET THIS VACCINE  If you ever had a life-threatening allergic reaction after a dose of any tetanus, diphtheria, or pertussis containing vaccine, OR if you have a severe allergy to any part of this vaccine, you should not get Tdap. Tell your doctor if you have any severe allergies.  If you had a coma, or long or  multiple seizures within 7 days after a childhood dose of DTP or DTaP, you should not get Tdap, unless a cause other than the vaccine was found. You can still get Td.  Talk to your doctor if you:  have epilepsy or another nervous system problem,  had severe pain or swelling after any vaccine containing diphtheria, tetanus or pertussis,  ever had Guillain-Barr Syndrome (GBS),  aren't feeling well on the day the shot is scheduled. RISKS OF A VACCINE REACTION With any medicine, including vaccines, there is a chance of side effects. These are usually mild and go away on their own, but serious reactions are also possible. Brief fainting spells can follow a vaccination, leading to injuries from falling. Sitting or lying down for about 15 minutes can help prevent these. Tell your doctor if you feel dizzy or light-headed, or have vision changes or ringing in the ears. Mild problems following Tdap (Did not interfere with activities)  Pain where the shot was given (about 3 in 4 adolescents or 2 in 3 adults)  Redness or swelling where the shot was given (about 1 person in 5)  Mild fever of at least 100.62F (up to about 1 in 25 adolescents or 1 in 100 adults)  Headache (about 3 or 4 people in 10)  Tiredness (about 1 person in 3 or 4)  Nausea, vomiting, diarrhea, stomach ache (up to 1 in 4 adolescents or 1 in 10 adults)  Chills, body aches, sore joints, rash, swollen glands (uncommon) Moderate problems following Tdap (Interfered with activities, but did not require medical attention)  Pain where the shot was given (about 1 in 5 adolescents or 1 in 100 adults)  Redness or swelling where the shot was given (up to about 1 in 16 adolescents or 1 in 25 adults)  Fever over 102F (about 1 in 100 adolescents or 1 in 250 adults)  Headache (about 3 in 20 adolescents or 1 in 10 adults)  Nausea, vomiting, diarrhea, stomach ache (up to 1 or 3 people in 100)  Swelling of the entire arm where the shot  was given (up to about 3 in 100). Severe problems following Tdap (Unable to perform usual activities, required medical attention)  Swelling, severe pain, bleeding and redness in the arm where the shot was given (rare). A severe allergic reaction could occur after any vaccine (estimated less than 1 in a million doses). WHAT IF THERE IS A SERIOUS REACTION? What should I look for?  Look for anything that concerns you, such as signs of a severe allergic reaction, very high fever, or behavior changes. Signs of a severe allergic reaction can include hives, swelling of the face and throat, difficulty breathing, a fast heartbeat, dizziness, and weakness. These would start a few minutes to a few hours after the vaccination. What should I do?  If you think it is a severe allergic reaction or other emergency that can't wait, call 9-1-1 or get the person to the nearest hospital. Otherwise, call your doctor.  Afterward, the reaction should be reported to the "Vaccine Adverse Event Reporting System" (VAERS). Your doctor might file this report, or you can do it yourself through the VAERS web site at www.vaers.LAgents.no, or by calling 1-(618) 245-7764. VAERS is only for reporting reactions. They do not give medical advice.  THE NATIONAL VACCINE INJURY COMPENSATION PROGRAM The National Vaccine Injury Compensation Program (VICP) is a federal program that was created to compensate people who may have been injured by certain vaccines. Persons who believe they may have been injured by a vaccine can learn about the program and about filing a claim by calling 1-(351) 073-2622 or visiting the VICP website at SpiritualWord.at. HOW CAN I LEARN MORE?  Ask your doctor.  Call your local or state health department.  Contact the Centers for Disease Control and Prevention (CDC):  Call (289) 408-7033 or visit CDC's website at PicCapture.uy CDC Tdap Vaccine VIS (10/10/11) Document Released: 11/19/2011  Document Reviewed: 11/19/2011 [facility] Patient Information 2013 [place], [place].  Genital Warts Genital warts are a sexually transmitted infection. They may appear as small bumps on the tissues of the genital area. CAUSES  Genital warts are caused by a virus called human papillomavirus (HPV). HPV is the most common sexually transmitted disease (STD) and infection of the sex organs. This infection is spread by having unprotected sex with an infected person. It can be spread by vaginal, anal, and oral sex. Many people do not know they are infected. They may be infected for years without problems. However, even if they do not have problems, they can unknowingly pass the infection to their sexual partners. SYMPTOMS   Itching and irritation in the genital area.  Warts that bleed.  Painful sexual intercourse. DIAGNOSIS  Warts are usually recognized with the naked eye on the vagina, vulva, perineum, anus,  and rectum. Certain tests can also diagnose genital warts, such as:  A Pap test.  A tissue sample (biopsy) exam.  Colposcopy. A magnifying tool is used to examine the vagina and cervix. The HPV cells will change color when certain solutions are used. TREATMENT  Warts can be removed by:  Applying certain chemicals, such as cantharidin or podophyllin.  Liquid nitrogen freezing (cryotherapy).  Immunotherapy with candida or trichophyton injections.  Laser treatment.  Burning with an electrified probe (electrocautery).  Interferon injections.  Surgery. PREVENTION  HPV vaccination can help prevent HPV infections that cause genital warts and that cause cancer of the cervix. It is recommended that the vaccination be given to people between the ages 84 to 62 years old. The vaccine might not work as well or might not work at all if you already have HPV. It should not be given to pregnant women. HOME CARE INSTRUCTIONS   It is important to follow your caregiver's instructions. The warts will  not go away without treatment. Repeat treatments are often needed to get rid of warts. Even after it appears that the warts are gone, the normal tissue underneath often remains infected.  Do not try to treat genital warts with medicine used to treat hand warts. This type of medicine is strong and can burn the skin in the genital area, causing more damage.  Tell your past and current sexual partner(s) that you have genital warts. They may be infected also and need treatment.  Avoid sexual contact while being treated.  Do not touch or scratch the warts. The infection may spread to other parts of your body.  Women with genital warts should have a cervical cancer check (Pap test) at least once a year. This type of cancer is slow-growing and can be cured if found early. Chances of developing cervical cancer are increased with HPV.  Inform your obstetrician about your warts in the event of pregnancy. This virus can be passed to the baby's respiratory tract. Discuss this with your caregiver.  Use a condom during sexual intercourse. Following treatment, the use of condoms will help prevent reinfection.  Ask your caregiver about using over-the-counter anti-itch creams. SEEK MEDICAL CARE IF:   Your treated skin becomes red, swollen, or painful.  You have a fever.  You feel generally ill.  You feel little lumps in and around your genital area.  You are bleeding or have painful sexual intercourse. MAKE SURE YOU:   Understand these instructions.  Will watch your condition.  Will get help right away if you are not doing well or get worse. Document Released: 05/17/2000 Document Revised: 08/12/2011 Document Reviewed: 11/26/2010 [facility] Patient Information 2013 [place], [place].

## 2012-07-14 NOTE — Progress Notes (Signed)
Patient ID: [name], female   DOB: 10/31/1945, 67 y.o.   MRN: [id number] History:    67 y.o.  Who presented to the office today with several complaints. Patient has not been seen the office since 10/16/2009. Patient is postmenopausal with a history of a transvaginal hysterectomy back in 10/16/1973 for benign entity. 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 Her mother was also diagnosed and died with tuberous sclerosis  10/17/11 benign colon polyps Past history vitamin D deficiency  Last bone density study over 10 years ago last mammogram greater than 5 years ago. Patient's primary physician which she has not seen and quite some time is Dr. Ivery Quale.  Patient was complaining today of a shingles outbreak near her perirectal and buttock area. She was also complaining of outbreak of condyloma in her labia (left) and right groin area  Past medical history,surgical history, family history and social history were all reviewed and documented in the EPIC chart.  Gynecologic History No LMP recorded. Patient has had a hysterectomy. Contraception:  Last Pap: 10/16/09. Results were: normal Last mammogram: greater than 5 years ago. Results were: normal  Obstetric History OB History   Grav Para Term Preterm Abortions TAB SAB Ect Mult Living                   ROS: A ROS was performed and pertinent positives and negatives are included in the history.  GENERAL: No fevers or chills. HEENT: No change in vision, no earache, sore throat or sinus congestion. NECK: No pain or stiffness. CARDIOVASCULAR: No chest pain or pressure. No palpitations. PULMONARY: No shortness of breath, cough or wheeze. GASTROINTESTINAL: No abdominal pain, nausea, vomiting or diarrhea, melena or bright red  blood per rectum. GENITOURINARY: No urinary frequency, urgency, hesitancy or dysuria. MUSCULOSKELETAL: joint pains, no back pain, no recent trauma. DERMATOLOGIC: shingles outbreak perirectal area ENDOCRINE: No polyuria, polydipsia, no heat or cold intolerance. No recent change in weight. HEMATOLOGICAL: No anemia or easy bruising or bleeding. NEUROLOGIC: No headache, seizures, numbness, tingling or weakness. PSYCHIATRIC: No depression, no loss of interest in normal activity or change in sleep pattern.     Exam: chaperone present  BP 140/88  Ht 5' 1.5" (1.562 m)  Wt 201 lb (91.173 kg)  BMI 37.37 kg/m2  Body mass index is 37.37 kg/(m^2).  General appearance : Well developed well nourished female. No acute distress HEENT: Neck supple, trachea midline, no carotid bruits, no thyroidmegaly Lungs: Clear to auscultation, no rhonchi or wheezes, or rib retractions  Heart: Regular rate and rhythm, no murmurs or gallops Breast:Examined in sitting and supine position were symmetrical in appearance, no palpable masses or tenderness,  no skin retraction, no nipple inversion, no nipple discharge, no skin discoloration, no axillary or supraclavicular lymphadenopathy Abdomen: no palpable masses or tenderness, no rebound or guarding Extremities: no edema or skin discoloration or tenderness  Pelvic:  Bartholin, Urethra, Skene Glands: Within normal limits  Physical Exam  Genitourinary:                   Vagina: No gross lesions or discharge  Cervix: absent  Uterus Absent  Adnexa  Without masses or tenderness  Anus and perineum  normal   Rectovaginal  normal sphincter tone without palpated masses or tenderness, perirectal  herpetic like lesions red tender and dry typical of herpes zoster             Hemoccult cards provided     Assessment/Plan:  67 y.o. female with active outbreak of shingles. She will be placed on Valtrex 1 g by mouth 3 times a day for 7 days. She was given a requisition to  schedule her mammogram. She will also be scheduled for bone density study here in the office in a few weeks. 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 She was reminded do her monthly self breast examination. I have given her the name of one of my colleagues at [facility] neurology department for her to followup for tuberous sclerosis. She was also reminded for her to make an appointment to see Dr. Ivery Quale her internist for medical evaluation and lab testing.

## 2012-07-21 DIAGNOSIS — M25569 Pain in unspecified knee: Secondary | ICD-10-CM | POA: Diagnosis not present

## 2012-07-28 ENCOUNTER — Other Ambulatory Visit: Payer: Self-pay | Admitting: Gynecology

## 2012-07-28 DIAGNOSIS — Z780 Asymptomatic menopausal state: Secondary | ICD-10-CM

## 2012-08-10 ENCOUNTER — Ambulatory Visit: Payer: Medicare Other | Admitting: Gynecology

## 2012-08-10 ENCOUNTER — Ambulatory Visit (INDEPENDENT_AMBULATORY_CARE_PROVIDER_SITE_OTHER): Payer: Medicare Other | Admitting: Gynecology

## 2012-08-10 ENCOUNTER — Encounter: Payer: Self-pay | Admitting: Gynecology

## 2012-08-10 VITALS — BP 130/84

## 2012-08-10 DIAGNOSIS — N899 Noninflammatory disorder of vagina, unspecified: Secondary | ICD-10-CM | POA: Diagnosis not present

## 2012-08-10 DIAGNOSIS — N760 Acute vaginitis: Secondary | ICD-10-CM | POA: Diagnosis not present

## 2012-08-10 DIAGNOSIS — N9089 Other specified noninflammatory disorders of vulva and perineum: Secondary | ICD-10-CM | POA: Diagnosis not present

## 2012-08-10 DIAGNOSIS — N893 Dysplasia of vagina, unspecified: Secondary | ICD-10-CM | POA: Diagnosis not present

## 2012-08-10 DIAGNOSIS — N906 Unspecified hypertrophy of vulva: Secondary | ICD-10-CM | POA: Diagnosis not present

## 2012-08-10 DIAGNOSIS — N898 Other specified noninflammatory disorders of vagina: Secondary | ICD-10-CM

## 2012-08-10 DIAGNOSIS — N890 Mild vaginal dysplasia: Secondary | ICD-10-CM | POA: Insufficient documentation

## 2012-08-10 LAB — WET PREP FOR TRICH, YEAST, CLUE
Clue Cells Wet Prep HPF POC: NONE SEEN
WBC, Wet Prep HPF POC: NONE SEEN
Yeast Wet Prep HPF POC: NONE SEEN

## 2012-08-10 MED ORDER — OXYCODONE-ACETAMINOPHEN 5-500 MG PO CAPS: 1.0000 | ORAL_CAPSULE | ORAL | Status: DC | PRN

## 2012-08-10 NOTE — Patient Instructions (Addendum)
Will call you with results within 1 week with plan of action.

## 2012-08-10 NOTE — Progress Notes (Addendum)
Patient ID: [name], female   DOB: 07/07/1945, 67 y.o.   MRN: [id number]  Patient is a 67 year old today to present to the office for colposcopic evaluation and biopsy as a result of the following noted at time of her recent gynecological exam:  Right groin raised lesion fleshy in appearance  Leukoplakic lesion noted between the left labia minora in the left labia majora inferior portion  Leukoplakic raised area left labia majora medial side lower half  Leukoplakia raised area left labia majora lateral side lower half  Patient's Pap smear from office visit February 11 as follows: LOW GRADE SQUAMOUS INTRAEPITHELIAL LESION: VAIN-1/ HPV (LSIL). 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 Acetic acid was applied to the external genitalia in the right groin area. The areas outlined above were biopsied with a keypunch biopsy after 1% lidocaine was infiltrated at the base of each lesion. Silver nitrate was used for hemostasis. The specimens were properly identified and marked and submitted for histological evaluation. A detail colposcopic evaluation of the vagina and vaginal cuff was undertaken in the acetowhite area that was noted at the center of the vaginal cuff was biopsy with a Kevorkian forcep. This tissue was also submitted for histological evaluation. Monsel solution was used for hemostasis.  Assessment/plan: Vain 1 of the vaginal cuff on Pap smear. Also it appears that patient may have recurrence of vulvar condylomas. We'll wait for the results of the above biopsies and plan on surgical treatment such as with laser in an outpatient setting.patient  stated she had a brownish discharge in her panties for a few days it appears that she had a slight tear at the area of the fourchette as a result of her vaginal atrophy. The wet prep was otherwise negative today. We'll discuss later by applying vaginal estrogen to help with her atrophy once the biopsy comes back.

## 2012-08-11 ENCOUNTER — Ambulatory Visit
Admission: RE | Admit: 2012-08-11 | Discharge: 2012-08-11 | Disposition: A | Discharge status: Home / Self Care | Payer: Medicare Other | Source: Ambulatory Visit | Attending: Gynecology | Admitting: Gynecology

## 2012-08-11 ENCOUNTER — Ambulatory Visit (INDEPENDENT_AMBULATORY_CARE_PROVIDER_SITE_OTHER): Payer: Medicare Other

## 2012-08-11 DIAGNOSIS — Z1231 Encounter for screening mammogram for malignant neoplasm of breast: Secondary | ICD-10-CM

## 2012-08-11 DIAGNOSIS — M949 Disorder of cartilage, unspecified: Secondary | ICD-10-CM | POA: Diagnosis not present

## 2012-08-11 DIAGNOSIS — M899 Disorder of bone, unspecified: Secondary | ICD-10-CM

## 2012-08-11 DIAGNOSIS — Z780 Asymptomatic menopausal state: Secondary | ICD-10-CM

## 2012-08-11 DIAGNOSIS — M8580 Other specified disorders of bone density and structure, unspecified site: Secondary | ICD-10-CM

## 2012-08-11 DIAGNOSIS — Z9882 Breast implant status: Secondary | ICD-10-CM

## 2012-08-17 ENCOUNTER — Other Ambulatory Visit: Payer: Self-pay | Admitting: Anesthesiology

## 2012-08-17 ENCOUNTER — Ambulatory Visit: Payer: Self-pay | Admitting: Gynecology

## 2012-08-17 ENCOUNTER — Encounter: Payer: Self-pay | Admitting: Gynecology

## 2012-08-17 VITALS — BP 128/74

## 2012-08-17 DIAGNOSIS — A630 Anogenital (venereal) warts: Secondary | ICD-10-CM

## 2012-08-17 DIAGNOSIS — E559 Vitamin D deficiency, unspecified: Secondary | ICD-10-CM | POA: Diagnosis not present

## 2012-08-17 DIAGNOSIS — M8580 Other specified disorders of bone density and structure, unspecified site: Secondary | ICD-10-CM

## 2012-08-17 DIAGNOSIS — R635 Abnormal weight gain: Secondary | ICD-10-CM | POA: Diagnosis not present

## 2012-08-17 DIAGNOSIS — N893 Dysplasia of vagina, unspecified: Secondary | ICD-10-CM

## 2012-08-17 MED ORDER — HYDROCORTISONE VALERATE 0.2 % EX CREA: TOPICAL_CREAM | Freq: Two times a day (BID) | CUTANEOUS | Status: DC

## 2012-08-17 MED ORDER — IBANDRONATE SODIUM 150 MG PO TABS: 150.0000 mg | ORAL_TABLET | ORAL | Status: DC

## 2012-08-17 MED ORDER — OXYCODONE-ACETAMINOPHEN 5-500 MG PO CAPS: 1.0000 | ORAL_CAPSULE | ORAL | Status: DC | PRN

## 2012-08-17 NOTE — Progress Notes (Signed)
[name] is an 67 y.o. female. For preoperative consultation and to discuss the results of her recent vaginal and vulvar biopsies. Patient was seen for her annual gynecological examination 07/14/2012 and had not been seen the office for 3 years.Patient is postmenopausal with a history of a transvaginal hysterectomy back in 1973-10-14 for benign entity. 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 Her mother was also diagnosed and died with tuberous sclerosis  Oct 15, 2011 benign colon polyps  Past history vitamin D deficiency  Last bone density study over 10 years ago last mammogram greater than 5 years ago. Patient's primary physician which she has not seen and quite some time is Dr. Ivery Quale.  Patient patient several weeks ago had shingles outbreak near her perirectal and buttock area. She had also complained of outbreak of condyloma in her labia (left) and right groin area. Her evaluation on February 11 demonstrated the following:  Pap smear 07/14/2012:LOW GRADE SQUAMOUS INTRAEPITHELIAL LESION: VAIN-1/ HPV (LSIL).  Pelvic:  Bartholin, Urethra, Skene Glands: Within normal limits  Physical Exam  Genitourinary:     Her pathology report demonstrated the following:  1. Vagina, biopsy, cuff - INFLAMMATION AND SLIGHT SQUAMOUS ATYPIA. - SEE MICROSCOPIC DESCRIPTION 2. Labium, biopsy, left, crease between majora/minora - MILD SQUAMOUS HYPERPLASIA AND HYPERKERATOSIS. - SEE MICROSCOPIC DESCRIPTION 3. Labium, biopsy, right majora medial - MILD SQUAMOUS HYPERPLASIA AND HYPERKERATOSIS. - SEE MICROSCOPIC DESCRIPTION 4. Labium, biopsy, left majora lateral - MILD SQUAMOUS HYPERPLASIA AND HYPERKERATOSIS. - SEE MICROSCOPIC DESCRIPTION 5. Skin , right groin - SQUAMOUS  HYPERPLASIA AND HYPERKERATOSIS. - SEE MICROSCOPIC DESCRIPTION Microscopic Comment 1. There is squamous mucosa with inflammation and associated squamous atypia including rare koilocytes. Much of the squamous atypia has features consistent with inflammatory and reactive atypia. Multiple additional levels are examined and the findings are similar. A p16 (HPV) stain is performed and there are rare cells positive in the superficial epithelium which may represent focal human papillomavirus cytopathic effect. No fungi are identified with PAS stain. 2. -5. There is slight, variable squamous hyperplasia associated with hyperkeratosis and there are occasional foci with koilocytic change. 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 There are no dysplastic changes and there is no evidence of malignancy. (JDP:kh 08-13-12) Jimmy Picket M  Pertinent Gynecological History: Menses: post-menopausal Bleeding: Postmenopausal no bleeding Contraception: post menopausal status DES exposure: denies Blood transfusions: none Sexually transmitted diseases: condyloma acuminatum Previous GYN Procedures: Wide local excision of the fourchette for VIN III  Last mammogram: Normal Date: 2012/10/14 Last pap: See above Date: see above OB History: G0, P0   Menstrual History: Menarche age: 89  No LMP recorded. Patient has had a hysterectomy.    Past Medical History  Diagnosis Date  . Arthritis   . Ulcer   . Diverticulosis   . Hiatal hernia   . Allergy     SEASONAL  . Cancer     VULVA CACER  . Cataract     BILATERAL  . GERD (gastroesophageal reflux disease)   . Osteoporosis   . Tuberous sclerosis     Past Surgical History  Procedure Laterality Date  . Abdominal hysterectomy    . Cataracts      BILATERAL  . Dilation and curettage of uterus    .  Back surgery    . Bladder surgery    . Colonoscopy    . Polypectomy     . Upper gastrointestinal endoscopy    . Cholecystectomy      Family History  Problem Relation Age of Onset  . Heart disease Mother   . Tuberous sclerosis Mother     Social History:  reports that she has quit smoking. She has never used smokeless tobacco. She reports that she does not drink alcohol or use illicit drugs.  Allergies:  Allergies  Allergen Reactions  . Sulfa Antibiotics      (Not in a hospital admission)  REVIEW OF SYSTEMS: A ROS was performed and pertinent positives and negatives are included in the history.  GENERAL: No fevers or chills. HEENT: No change in vision, no earache, sore throat or sinus congestion. NECK: No pain or stiffness. CARDIOVASCULAR: No chest pain or pressure. No palpitations. PULMONARY: No shortness of breath, cough or wheeze. GASTROINTESTINAL: No abdominal pain, nausea, vomiting or diarrhea, melena or bright red blood per rectum. GENITOURINARY: No urinary frequency, urgency, hesitancy or dysuria. MUSCULOSKELETAL: No joint or muscle pain, no back pain, no recent trauma. DERMATOLOGIC: No rash, no itching, no lesions. ENDOCRINE: No polyuria, polydipsia, no heat or cold intolerance. No recent change in weight. HEMATOLOGICAL: No anemia or easy bruising or bleeding. NEUROLOGIC: No headache, seizures, numbness, tingling or weakness. PSYCHIATRIC: No depression, no loss of interest in normal activity or change in sleep pattern.     Blood pressure 128/74.  Physical Exam:  HEENT:unremarkable Neck:Supple, midline, no thyroid megaly, no carotid bruits Lungs:  Clear to auscultation no rhonchi's or wheezes Heart:Regular rate and rhythm, no murmurs or gallops Breast Exam:examined sitting supine position were symmetrical in appearance no skin discoloration or palpable masses or tenderness Abdomen:soft nontender no rebound or guarding Pelvic:BUS as described above Vagina:as described above Cervix:absent Uterus:absent Adnexa:no tenderness or  masses Extremities: No cords, no edema Rectal:unremarkable   Assessment/Plan: Patient with squamous atypia the vaginal cuff and mild hyperplasia of several areas of the external genitalia with areas suspicious for condyloma acuminatum. Patient would like to treat this as an outpatient via laser. The risk benefits and pros and cons were discussed as follows:                        Patient was counseled as to the risk of surgery to include the following:  1. Infection (prohylactic antibiotics will be administered)  2. DVT/Pulmonary Embolism (prophylactic pneumo compression stockings will be used)  3.Trauma to internal organs requiring additional surgical procedure to repair any injury to     Internal organs requiring perhaps additional hospitalization days.  4.Hemmorhage requiring transfusion and blood products which carry risks such as  anaphylactic reaction, hepatitis and AIDS  Patient had received literature information on the procedure scheduled and all her questions were answered and fully accepts all risk.  [facility] HMD5:34 PMTD@  [employer] [facility] 08/17/2012, 5:21 PM

## 2012-08-17 NOTE — Patient Instructions (Addendum)
Osteoporosis Throughout your life, your body breaks down old bone and replaces it with new bone. As you get older, your body does not replace bone as quickly as it breaks it down. By the age of 30 years, most people begin to gradually lose bone because of the imbalance between bone loss and replacement. Some people lose more bone than others. Bone loss beyond a specified normal degree is considered osteoporosis.  Osteoporosis affects the strength and durability of your bones. The inside of the ends of your bones and your flat bones, like the bones of your pelvis, look like honeycomb, filled with tiny open spaces. As bone loss occurs, your bones become less dense. This means that the open spaces inside your bones become bigger and the walls between these spaces become thinner. This makes your bones weaker. Bones of a person with osteoporosis can become so weak that they can break (fracture) during minor accidents, such as a simple fall. CAUSES  The following factors have been associated with the development of osteoporosis:  Smoking.  Drinking more than 2 alcoholic drinks several days per week.  Long-term use of certain medicines:  Corticosteroids.  Chemotherapy medicines.  Thyroid medicines.  Antiepileptic medicines.  Gonadal hormone suppression medicine.  Immunosuppression medicine.  Being underweight.  Lack of physical activity.  Lack of exposure to the sun. This can lead to vitamin D deficiency.  Certain medical conditions:  Certain inflammatory bowel diseases, such as Crohn's disease and ulcerative colitis.  Diabetes.  Hyperthyroidism.  Hyperparathyroidism. RISK FACTORS Anyone can develop osteoporosis. However, the following factors can increase your risk of developing osteoporosis:  Gender Women are at higher risk than men.  Age Being older than 50 years increases your risk.  Ethnicity White and Asian people have an increased risk.  Weight Being extremely  underweight can increase your risk of osteoporosis.  Family history of osteoporosis Having a family member who has developed osteoporosis can increase your risk. SYMPTOMS  Usually, people with osteoporosis have no symptoms.  DIAGNOSIS  Signs during a physical exam that may prompt your caregiver to suspect osteoporosis include:  Decreased height. This is usually caused by the compression of the bones that form your spine (vertebrae) because they have weakened and become fractured.  A curving or rounding of the upper back (kyphosis). To confirm signs of osteoporosis, your caregiver may request a procedure that uses 2 low-dose X-ray beams with different levels of energy to measure your bone mineral density (dual-energy X-ray absorptiometry [DXA]). Also, your caregiver may check your level of vitamin D. TREATMENT  The goal of osteoporosis treatment is to strengthen bones in order to decrease the risk of bone fractures. There are different types of medicines available to help achieve this goal. Some of these medicines work by slowing the processes of bone loss. Some medicines work by increasing bone density. Treatment also involves making sure that your levels of calcium and vitamin D are adequate. PREVENTION  There are things you can do to help prevent osteoporosis. Adequate intake of calcium and vitamin D can help you achieve optimal bone mineral density. Regular exercise can also help, especially resistance and high-impact activities. If you smoke, quitting smoking is an important part of osteoporosis prevention. MAKE SURE YOU:  Understand these instructions.  Will watch your condition.  Will get help right away if you are not doing well or get worse. Document Released: 02/27/2005 Document Revised: 08/12/2011 Document Reviewed: 05/04/2011 [facility] Patient Information 2013 [place], [place].  Ibandronate monthly tablets What is  this medicine? IBANDRONATE (i BAN droh nate) slows calcium loss  from bones. It is used to treat osteoporosis in women past the age of menopause. This medicine may be used for other purposes; ask your health care provider or pharmacist if you have questions. What should I tell my health care provider before I take this medicine? They need to know if you have any of these conditions: -dental disease -esophageal, stomach, or intestine problems, like acid reflux or GERD -kidney disease -low blood calcium -low vitamin D -problems sitting or standing for 60 minutes -trouble swallowing -an unusual or allergic reaction to ibandronate, other medicines, foods, dyes, or preservatives -pregnant or trying to get pregnant -breast-feeding How should I use this medicine? You must take this medicine exactly as directed or you will lower the amount of medicine you absorb into your body or you may cause yourself harm. Take your dose by mouth first thing in the morning, after you are up for the day. Do not eat or drink anything before you take this medicine. Swallow the tablet with a full glass (6 to 8 ounces) of plain water. Do not take this medicine with any other drink. Do not chew or crush the tablet. After taking this medicine, do not eat breakfast, drink, or take any other medicines or vitamins for at least 1 hour. Stand or sit up for at least 1 hour after taking this medicine. Do not lie down. Take this medicine on the same day every month. Do not take your medicine more often than directed. Talk to your pediatrician regarding the use of this medicine in children. Special care may be needed. Overdosage: If you think you have taken too much of this medicine contact a poison control center or emergency room at once. NOTE: This medicine is only for you. Do not share this medicine with others. What if I miss a dose? If you miss a dose and your next dose is more than 7 days away then take the missed dose on the next morning you remember. Then take your next dose on your  regular day of the month. If your next dose is due within the next 7 days then skip the missed dose. Do not take 2 tablets within 1 week of each other. Do not take double or extra doses. What may interact with this medicine? -aluminum hydroxide -antacids -aspirin -calcium supplements -drugs for inflammation like ibuprofen, naproxen, and others -iron supplements -magnesium supplements -vitamins with minerals This list may not describe all possible interactions. Give your health care provider a list of all the medicines, herbs, non-prescription drugs, or dietary supplements you use. Also tell them if you smoke, drink alcohol, or use illegal drugs. Some items may interact with your medicine. What should I watch for while using this medicine? Visit your doctor or health care professional for regular check ups. It may be some time before you see the benefit from this medicine. Do not stop taking your medicine unless your doctor tells you to. Your doctor may order blood tests and other tests to see how you are doing. You should make sure that you get enough calcium and vitamin D while you are taking this medicine. Discuss the foods you eat and the vitamins you take with your health care professional. Some people who take this medicine have severe bone, joint, and/or muscle pain. This medicine may also increase your risk for a broken thigh bone. Tell your doctor right away if you have pain in your upper  leg or groin. Tell your doctor if you have any pain that does not go away or that gets worse. What side effects may I notice from receiving this medicine? Side effects that you should report to your doctor or health care professional as soon as possible: -allergic reactions such as skin rash or itching, hives, swelling of the face, lips, throat or tongue -black or tarry stools -change in vision -chest pain -heartburn or stomach pain -jaw pain, especially after dental work -redness, blistering,  peeling, or loosening of the skin, including inside the mouth -trouble or pain when swallowing Side effects that usually do not require medical attention (report to your doctor or health care professional if they continue or are bothersome): -bone, muscle or joint pain -changes in taste -diarrhea or constipation -headache -nausea or vomiting -stomach gas or fullness This list may not describe all possible side effects. Call your doctor for medical advice about side effects. You may report side effects to FDA at 1-800-FDA-1088. Where should I keep my medicine? Keep out of the reach of children. Store at room temperature between 15 and 30 degrees C (59 and 86 degrees F). Throw away any unused medication after the expiration date. NOTE: This sheet is a summary. It may not cover all possible information. If you have questions about this medicine, talk to your doctor, pharmacist, or health care provider.  2013, [employer]. (11/16/2010 9:03:09 AM)

## 2012-08-18 LAB — HEMOGLOBIN A1C: Hgb A1c MFr Bld: 5.1 % (ref <5.7)

## 2012-08-19 ENCOUNTER — Other Ambulatory Visit: Payer: Self-pay | Admitting: Gynecology

## 2012-08-19 DIAGNOSIS — E559 Vitamin D deficiency, unspecified: Secondary | ICD-10-CM

## 2012-08-19 LAB — VITAMIN D 25 HYDROXY (VIT D DEFICIENCY, FRACTURES): Vit D, 25-Hydroxy: 20 ng/mL — ABNORMAL LOW (ref 30–89)

## 2012-08-19 LAB — PTH, INTACT AND CALCIUM: Calcium, Total (PTH): 9.3 mg/dL (ref 8.4–10.5)

## 2012-08-19 MED ORDER — VITAMIN D (ERGOCALCIFEROL) 1.25 MG (50000 UNIT) PO CAPS: ORAL_CAPSULE | ORAL | Status: DC

## 2012-08-20 ENCOUNTER — Other Ambulatory Visit: Payer: Self-pay | Admitting: Gynecology

## 2012-08-20 DIAGNOSIS — E559 Vitamin D deficiency, unspecified: Secondary | ICD-10-CM

## 2012-08-26 ENCOUNTER — Encounter (HOSPITAL_BASED_OUTPATIENT_CLINIC_OR_DEPARTMENT_OTHER): Payer: Self-pay | Admitting: *Deleted

## 2012-08-26 ENCOUNTER — Telehealth: Payer: Self-pay

## 2012-08-26 NOTE — Telephone Encounter (Signed)
Patient calling to ask if any pre-op appt for surgery at [facility].  I told her usually pre-op labs,etc are handled the morning of surgery.  I explained that occasionally based on health history that anesthesia may feel the need for additional labs, EKG or CXray, etc. That may need to be done prior to day of surgery. I told her the nurse from surgery center would call and let her know this if that was the case.

## 2012-08-27 ENCOUNTER — Encounter (HOSPITAL_BASED_OUTPATIENT_CLINIC_OR_DEPARTMENT_OTHER): Payer: Self-pay | Admitting: *Deleted

## 2012-08-28 ENCOUNTER — Encounter (HOSPITAL_BASED_OUTPATIENT_CLINIC_OR_DEPARTMENT_OTHER): Payer: Self-pay | Admitting: *Deleted

## 2012-08-28 NOTE — Progress Notes (Signed)
NPO AFTER MN. ARRIVES AT 0615. PT COMING FOR LAB WORK ON Monday , 08-31-2012 AT 1400. WILL TAKE DEXILANT AM OF SURG W/ SIP OF WATER.

## 2012-08-31 DIAGNOSIS — Z882 Allergy status to sulfonamides status: Secondary | ICD-10-CM | POA: Diagnosis not present

## 2012-08-31 DIAGNOSIS — M810 Age-related osteoporosis without current pathological fracture: Secondary | ICD-10-CM | POA: Diagnosis not present

## 2012-08-31 DIAGNOSIS — N898 Other specified noninflammatory disorders of vagina: Secondary | ICD-10-CM | POA: Diagnosis not present

## 2012-08-31 DIAGNOSIS — N904 Leukoplakia of vulva: Secondary | ICD-10-CM | POA: Diagnosis not present

## 2012-08-31 DIAGNOSIS — K5730 Diverticulosis of large intestine without perforation or abscess without bleeding: Secondary | ICD-10-CM | POA: Diagnosis not present

## 2012-08-31 DIAGNOSIS — Z90710 Acquired absence of both cervix and uterus: Secondary | ICD-10-CM | POA: Diagnosis not present

## 2012-08-31 DIAGNOSIS — K219 Gastro-esophageal reflux disease without esophagitis: Secondary | ICD-10-CM | POA: Diagnosis not present

## 2012-08-31 DIAGNOSIS — R6889 Other general symptoms and signs: Secondary | ICD-10-CM | POA: Diagnosis not present

## 2012-08-31 DIAGNOSIS — K6289 Other specified diseases of anus and rectum: Secondary | ICD-10-CM | POA: Diagnosis not present

## 2012-08-31 DIAGNOSIS — H04129 Dry eye syndrome of unspecified lacrimal gland: Secondary | ICD-10-CM | POA: Diagnosis not present

## 2012-08-31 DIAGNOSIS — Q851 Tuberous sclerosis: Secondary | ICD-10-CM | POA: Diagnosis not present

## 2012-08-31 DIAGNOSIS — M129 Arthropathy, unspecified: Secondary | ICD-10-CM | POA: Diagnosis not present

## 2012-08-31 DIAGNOSIS — N760 Acute vaginitis: Secondary | ICD-10-CM | POA: Diagnosis not present

## 2012-08-31 DIAGNOSIS — K449 Diaphragmatic hernia without obstruction or gangrene: Secondary | ICD-10-CM | POA: Diagnosis not present

## 2012-08-31 DIAGNOSIS — N9089 Other specified noninflammatory disorders of vulva and perineum: Secondary | ICD-10-CM | POA: Diagnosis not present

## 2012-08-31 DIAGNOSIS — Z87891 Personal history of nicotine dependence: Secondary | ICD-10-CM | POA: Diagnosis not present

## 2012-08-31 LAB — CBC
MCH: 31.8 pg (ref 26.0–34.0)
MCHC: 35 g/dL (ref 30.0–36.0)
Platelets: 208 10*3/uL (ref 150–400)

## 2012-08-31 NOTE — Anesthesia Preprocedure Evaluation (Addendum)
Anesthesia Evaluation  Patient identified by MRN, date of birth, ID band Patient awake    Reviewed: Allergy & Precautions, H&P , NPO status , Patient's Chart, lab work & pertinent test results  Airway Mallampati: II TM Distance: >3 FB Neck ROM: Full    Dental  (+) Teeth Intact, Chipped and Dental Advisory Given,    Pulmonary neg pulmonary ROS, sleep apnea ,  breath sounds clear to auscultation  Pulmonary exam normal       Cardiovascular negative cardio ROS  Rhythm:Regular Rate:Normal     Neuro/Psych Seizures -, Well Controlled,  negative psych ROS   GI/Hepatic Neg liver ROS, hiatal hernia, GERD-  Medicated,  Endo/Other  negative endocrine ROS  Renal/GU negative Renal ROS  negative genitourinary   Musculoskeletal negative musculoskeletal ROS (+)   Abdominal   Peds  Hematology negative hematology ROS (+)   Anesthesia Other Findings   Reproductive/Obstetrics                          Anesthesia Physical Anesthesia Plan  ASA: II  Anesthesia Plan: General   Post-op Pain Management:    Induction: Intravenous  Airway Management Planned: LMA  Additional Equipment:   Intra-op Plan:   Post-operative Plan: Extubation in OR  Informed Consent: I have reviewed the patients History and Physical, chart, labs and discussed the procedure including the risks, benefits and alternatives for the proposed anesthesia with the patient or authorized representative who has indicated his/her understanding and acceptance.   Dental advisory given  Plan Discussed with: CRNA  Anesthesia Plan Comments:         Anesthesia Quick Evaluation

## 2012-09-01 ENCOUNTER — Ambulatory Visit (HOSPITAL_BASED_OUTPATIENT_CLINIC_OR_DEPARTMENT_OTHER): Payer: Medicare Other | Admitting: Anesthesiology

## 2012-09-01 ENCOUNTER — Encounter (HOSPITAL_BASED_OUTPATIENT_CLINIC_OR_DEPARTMENT_OTHER): Payer: Self-pay | Admitting: *Deleted

## 2012-09-01 ENCOUNTER — Ambulatory Visit (HOSPITAL_BASED_OUTPATIENT_CLINIC_OR_DEPARTMENT_OTHER)
Admission: RE | Admit: 2012-09-01 | Discharge: 2012-09-01 | Disposition: A | Discharge status: Home / Self Care | Payer: Medicare Other | Source: Ambulatory Visit | Attending: Gynecology | Admitting: Gynecology

## 2012-09-01 ENCOUNTER — Encounter (HOSPITAL_BASED_OUTPATIENT_CLINIC_OR_DEPARTMENT_OTHER): Payer: Self-pay | Admitting: Anesthesiology

## 2012-09-01 ENCOUNTER — Encounter (HOSPITAL_BASED_OUTPATIENT_CLINIC_OR_DEPARTMENT_OTHER)
Admission: RE | Disposition: A | Discharge status: Home / Self Care | Payer: Self-pay | Source: Ambulatory Visit | Attending: Gynecology

## 2012-09-01 DIAGNOSIS — N760 Acute vaginitis: Secondary | ICD-10-CM | POA: Insufficient documentation

## 2012-09-01 DIAGNOSIS — Z87891 Personal history of nicotine dependence: Secondary | ICD-10-CM | POA: Insufficient documentation

## 2012-09-01 DIAGNOSIS — A630 Anogenital (venereal) warts: Secondary | ICD-10-CM

## 2012-09-01 DIAGNOSIS — K449 Diaphragmatic hernia without obstruction or gangrene: Secondary | ICD-10-CM | POA: Insufficient documentation

## 2012-09-01 DIAGNOSIS — Z9889 Other specified postprocedural states: Secondary | ICD-10-CM

## 2012-09-01 DIAGNOSIS — D280 Benign neoplasm of vulva: Secondary | ICD-10-CM

## 2012-09-01 DIAGNOSIS — N72 Inflammatory disease of cervix uteri: Secondary | ICD-10-CM | POA: Diagnosis not present

## 2012-09-01 DIAGNOSIS — R6889 Other general symptoms and signs: Secondary | ICD-10-CM | POA: Insufficient documentation

## 2012-09-01 DIAGNOSIS — Z882 Allergy status to sulfonamides status: Secondary | ICD-10-CM | POA: Insufficient documentation

## 2012-09-01 DIAGNOSIS — Q851 Tuberous sclerosis: Secondary | ICD-10-CM | POA: Insufficient documentation

## 2012-09-01 DIAGNOSIS — N893 Dysplasia of vagina, unspecified: Secondary | ICD-10-CM | POA: Diagnosis not present

## 2012-09-01 DIAGNOSIS — D128 Benign neoplasm of rectum: Secondary | ICD-10-CM | POA: Diagnosis not present

## 2012-09-01 DIAGNOSIS — N898 Other specified noninflammatory disorders of vagina: Secondary | ICD-10-CM | POA: Insufficient documentation

## 2012-09-01 DIAGNOSIS — W57XXXA Bitten or stung by nonvenomous insect and other nonvenomous arthropods, initial encounter: Secondary | ICD-10-CM | POA: Diagnosis not present

## 2012-09-01 DIAGNOSIS — D129 Benign neoplasm of anus and anal canal: Secondary | ICD-10-CM | POA: Diagnosis not present

## 2012-09-01 DIAGNOSIS — Z90710 Acquired absence of both cervix and uterus: Secondary | ICD-10-CM | POA: Insufficient documentation

## 2012-09-01 DIAGNOSIS — K219 Gastro-esophageal reflux disease without esophagitis: Secondary | ICD-10-CM | POA: Insufficient documentation

## 2012-09-01 DIAGNOSIS — M810 Age-related osteoporosis without current pathological fracture: Secondary | ICD-10-CM | POA: Insufficient documentation

## 2012-09-01 DIAGNOSIS — N904 Leukoplakia of vulva: Secondary | ICD-10-CM | POA: Diagnosis not present

## 2012-09-01 DIAGNOSIS — D281 Benign neoplasm of vagina: Secondary | ICD-10-CM | POA: Diagnosis not present

## 2012-09-01 DIAGNOSIS — K6289 Other specified diseases of anus and rectum: Secondary | ICD-10-CM | POA: Diagnosis not present

## 2012-09-01 DIAGNOSIS — M129 Arthropathy, unspecified: Secondary | ICD-10-CM | POA: Insufficient documentation

## 2012-09-01 DIAGNOSIS — N9089 Other specified noninflammatory disorders of vulva and perineum: Secondary | ICD-10-CM | POA: Insufficient documentation

## 2012-09-01 DIAGNOSIS — N7689 Other specified inflammation of vagina and vulva: Secondary | ICD-10-CM | POA: Diagnosis not present

## 2012-09-01 DIAGNOSIS — K5730 Diverticulosis of large intestine without perforation or abscess without bleeding: Secondary | ICD-10-CM | POA: Insufficient documentation

## 2012-09-01 HISTORY — PX: CO2 LASER APPLICATION: SHX5778

## 2012-09-01 HISTORY — DX: Personal history of other diseases of the nervous system and sense organs: Z86.69

## 2012-09-01 HISTORY — DX: Solitary pulmonary nodule: R91.1

## 2012-09-01 HISTORY — DX: Irritable bowel syndrome, unspecified: K58.9

## 2012-09-01 HISTORY — DX: Personal history of other diseases of the digestive system: Z87.19

## 2012-09-01 HISTORY — DX: Personal history of other infectious and parasitic diseases: Z86.19

## 2012-09-01 HISTORY — DX: Personal history of peptic ulcer disease: Z87.11

## 2012-09-01 LAB — URINALYSIS, ROUTINE W REFLEX MICROSCOPIC
Ketones, ur: NEGATIVE mg/dL
Nitrite: NEGATIVE
Protein, ur: NEGATIVE mg/dL

## 2012-09-01 SURGERY — CO2 LASER APPLICATION
Anesthesia: General | Site: Vagina | Wound class: Clean Contaminated

## 2012-09-01 MED ORDER — FENTANYL CITRATE 0.05 MG/ML IJ SOLN
INTRAMUSCULAR | Status: DC | PRN
  Administered 2012-09-01: 50 ug via INTRAVENOUS
  Administered 2012-09-01 (×5): 25 ug via INTRAVENOUS
  Administered 2012-09-01: 50 ug via INTRAVENOUS
  Administered 2012-09-01 (×3): 25 ug via INTRAVENOUS

## 2012-09-01 MED ORDER — DEXAMETHASONE SODIUM PHOSPHATE 4 MG/ML IJ SOLN
INTRAMUSCULAR | Status: DC | PRN
  Administered 2012-09-01: 10 mg via INTRAVENOUS

## 2012-09-01 MED ORDER — MIDAZOLAM HCL 5 MG/5ML IJ SOLN
INTRAMUSCULAR | Status: DC | PRN
  Administered 2012-09-01: 2 mg via INTRAVENOUS

## 2012-09-01 MED ORDER — PROMETHAZINE HCL 25 MG/ML IJ SOLN
6.2500 mg | INTRAMUSCULAR | Status: DC | PRN
  Filled 2012-09-01: qty 1

## 2012-09-01 MED ORDER — METOCLOPRAMIDE HCL 10 MG PO TABS: 10.0000 mg | ORAL_TABLET | Freq: Three times a day (TID) | ORAL | Status: DC

## 2012-09-01 MED ORDER — FENTANYL CITRATE 0.05 MG/ML IJ SOLN
25.0000 ug | INTRAMUSCULAR | Status: DC | PRN
  Filled 2012-09-01: qty 1

## 2012-09-01 MED ORDER — LACTATED RINGERS IV SOLN
INTRAVENOUS | Status: DC
  Administered 2012-09-01: 07:00:00 via INTRAVENOUS
  Filled 2012-09-01: qty 1000

## 2012-09-01 MED ORDER — LIDOCAINE HCL (CARDIAC) 20 MG/ML IV SOLN
INTRAVENOUS | Status: DC | PRN
  Administered 2012-09-01: 100 mg via INTRAVENOUS

## 2012-09-01 MED ORDER — ONDANSETRON HCL 4 MG/2ML IJ SOLN
INTRAMUSCULAR | Status: DC | PRN
  Administered 2012-09-01: 4 mg via INTRAVENOUS

## 2012-09-01 MED ORDER — ESTRADIOL 0.1 MG/GM VA CREA
TOPICAL_CREAM | VAGINAL | Status: DC | PRN
  Administered 2012-09-01: 1 via VAGINAL

## 2012-09-01 MED ORDER — LACTATED RINGERS IV SOLN
INTRAVENOUS | Status: DC | PRN
  Administered 2012-09-01 (×2): via INTRAVENOUS

## 2012-09-01 MED ORDER — PROPOFOL 10 MG/ML IV BOLUS
INTRAVENOUS | Status: DC | PRN
  Administered 2012-09-01: 200 mg via INTRAVENOUS

## 2012-09-01 MED ORDER — FERRIC SUBSULFATE SOLN
Status: DC | PRN
  Administered 2012-09-01: 1 via TOPICAL

## 2012-09-01 MED ORDER — ACETIC ACID 5 % SOLN
Status: DC | PRN
  Administered 2012-09-01: 1 via TOPICAL

## 2012-09-01 MED ORDER — KETOROLAC TROMETHAMINE 30 MG/ML IJ SOLN
INTRAMUSCULAR | Status: DC | PRN
  Administered 2012-09-01: 30 mg via INTRAVENOUS

## 2012-09-01 MED ORDER — STERILE WATER FOR IRRIGATION IR SOLN
Status: DC | PRN
  Administered 2012-09-01: 1000 mL

## 2012-09-01 MED ORDER — LACTATED RINGERS IV SOLN
INTRAVENOUS | Status: DC
  Filled 2012-09-01: qty 1000

## 2012-09-01 MED ORDER — LIDOCAINE HCL 2 % EX GEL: CUTANEOUS | Status: DC | PRN

## 2012-09-01 SURGICAL SUPPLY — 49 items
APPLICATOR COTTON TIP 6IN STRL (MISCELLANEOUS) ×2 IMPLANT
BLADE SURG 15 STRL LF DISP TIS (BLADE) ×1 IMPLANT
BLADE SURG 15 STRL SS (BLADE) ×2
CANISTER SUCTION 1200CC (MISCELLANEOUS) IMPLANT
CANISTER SUCTION 2500CC (MISCELLANEOUS) ×1 IMPLANT
CATH ROBINSON RED A/P 14FR (CATHETERS) IMPLANT
CLOTH BEACON ORANGE TIMEOUT ST (SAFETY) ×2 IMPLANT
COVER TABLE BACK 60X90 (DRAPES) ×2 IMPLANT
DEPRESSOR TONGUE BLADE STERILE (MISCELLANEOUS) ×2 IMPLANT
DRAPE LG THREE QUARTER DISP (DRAPES) ×2 IMPLANT
DRAPE UNDERBUTTOCKS STRL (DRAPE) ×2 IMPLANT
ELECT BALL LEEP 3MM BLK (ELECTRODE) ×1 IMPLANT
ELECT NDL TIP 2.8 STRL (NEEDLE) IMPLANT
ELECT NEEDLE TIP 2.8 STRL (NEEDLE) ×2 IMPLANT
ELECT REM PT RETURN 9FT ADLT (ELECTROSURGICAL) ×2
ELECTRODE REM PT RTRN 9FT ADLT (ELECTROSURGICAL) ×1 IMPLANT
GLOVE BIO SURGEON STRL SZ 6.5 (GLOVE) ×1 IMPLANT
GLOVE BIO SURGEON STRL SZ7 (GLOVE) ×2 IMPLANT
GLOVE ECLIPSE 6.5 STRL STRAW (GLOVE) ×1 IMPLANT
GLOVE ECLIPSE 7.5 STRL STRAW (GLOVE) ×2 IMPLANT
GLOVE INDICATOR 8.0 STRL GRN (GLOVE) ×2 IMPLANT
GOWN PREVENTION PLUS LG XLONG (DISPOSABLE) ×2 IMPLANT
GOWN STRL REIN XL XLG (GOWN DISPOSABLE) ×2 IMPLANT
LEGGING LITHOTOMY PAIR STRL (DRAPES) ×2 IMPLANT
NDL SAFETY ECLIPSE 18X1.5 (NEEDLE) IMPLANT
NEEDLE HYPO 18GX1.5 SHARP (NEEDLE) ×2
NS IRRIG 500ML POUR BTL (IV SOLUTION) IMPLANT
PACK BASIN DAY SURGERY FS (CUSTOM PROCEDURE TRAY) ×2 IMPLANT
PAD OB MATERNITY 4.3X12.25 (PERSONAL CARE ITEMS) ×2 IMPLANT
PAD PREP 24X48 CUFFED NSTRL (MISCELLANEOUS) ×2 IMPLANT
PENCIL BUTTON HOLSTER BLD 10FT (ELECTRODE) ×2 IMPLANT
SCOPETTES 8  STERILE (MISCELLANEOUS) ×5
SCOPETTES 8 STERILE (MISCELLANEOUS) IMPLANT
SUT VIC AB 2-0 PS2 27 (SUTURE) ×2 IMPLANT
SUT VIC AB 3-0 CT1 27 (SUTURE)
SUT VIC AB 3-0 CT1 27XBRD (SUTURE) IMPLANT
SUT VIC AB 3-0 FS2 27 (SUTURE) ×2 IMPLANT
SUT VIC AB 3-0 X1 27 (SUTURE) IMPLANT
SWAB CULTURE LIQ STUART DBL (MISCELLANEOUS) IMPLANT
SYR TB 1ML LL NO SAFETY (SYRINGE) IMPLANT
SYRINGE IRR TOOMEY STRL 70CC (SYRINGE) IMPLANT
TOWEL OR 17X24 6PK STRL BLUE (TOWEL DISPOSABLE) ×4 IMPLANT
TRAY DSU PREP LF (CUSTOM PROCEDURE TRAY) ×2 IMPLANT
TUBE ANAEROBIC SPECIMEN COL (MISCELLANEOUS) IMPLANT
TUBE CONNECTING 12X1/4 (SUCTIONS) ×1 IMPLANT
VACUUM HOSE 7/8X10 W/ WAND (MISCELLANEOUS) ×1 IMPLANT
VACUUM HOSE/TUBING 7/8INX6FT (MISCELLANEOUS) ×2 IMPLANT
WATER STERILE IRR 500ML POUR (IV SOLUTION) ×2 IMPLANT
YANKAUER SUCT BULB TIP NO VENT (SUCTIONS) IMPLANT

## 2012-09-01 NOTE — H&P (View-Only) (Signed)
[name] is an 67 y.o. female. For preoperative consultation and to discuss the results of her recent vaginal and vulvar biopsies. Patient was seen for her annual gynecological examination 07/14/2012 and had not been seen the office for 3 years.Patient is postmenopausal with a history of a transvaginal hysterectomy back in 1975 for benign entity. 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 Her mother was also diagnosed and died with tuberous sclerosis  2013 benign colon polyps  Past history vitamin D deficiency  Last bone density study over 10 years ago last mammogram greater than 5 years ago. Patient's primary physician which she has not seen and quite some time is Dr. Daniel Patterson.  Patient patient several weeks ago had shingles outbreak near her perirectal and buttock area. She had also complained of outbreak of condyloma in her labia (left) and right groin area. Her evaluation on February 11 demonstrated the following:  Pap smear 07/14/2012:LOW GRADE SQUAMOUS INTRAEPITHELIAL LESION: VAIN-1/ HPV (LSIL).  Pelvic:  Bartholin, Urethra, Skene Glands: Within normal limits  Physical Exam  Genitourinary:     Her pathology report demonstrated the following:  1. Vagina, biopsy, cuff - INFLAMMATION AND SLIGHT SQUAMOUS ATYPIA. - SEE MICROSCOPIC DESCRIPTION 2. Labium, biopsy, left, crease between majora/minora - MILD SQUAMOUS HYPERPLASIA AND HYPERKERATOSIS. - SEE MICROSCOPIC DESCRIPTION 3. Labium, biopsy, right majora medial - MILD SQUAMOUS HYPERPLASIA AND HYPERKERATOSIS. - SEE MICROSCOPIC DESCRIPTION 4. Labium, biopsy, left majora lateral - MILD SQUAMOUS HYPERPLASIA AND HYPERKERATOSIS. - SEE MICROSCOPIC DESCRIPTION 5. Skin , right groin - SQUAMOUS  HYPERPLASIA AND HYPERKERATOSIS. - SEE MICROSCOPIC DESCRIPTION Microscopic Comment 1. There is squamous mucosa with inflammation and associated squamous atypia including rare koilocytes. Much of the squamous atypia has features consistent with inflammatory and reactive atypia. Multiple additional levels are examined and the findings are similar. A p16 (HPV) stain is performed and there are rare cells positive in the superficial epithelium which may represent focal human papillomavirus cytopathic effect. No fungi are identified with PAS stain. 2. -5. There is slight, variable squamous hyperplasia associated with hyperkeratosis and there are occasional foci with koilocytic change. 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 There are no dysplastic changes and there is no evidence of malignancy. 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 Patient has had a hysterectomy.    Past Medical History  Diagnosis Date  . Arthritis   . Ulcer   . Diverticulosis   . Hiatal hernia   . Allergy     SEASONAL  . Cancer     VULVA CACER  . Cataract     BILATERAL  . GERD (gastroesophageal reflux disease)   . Osteoporosis   . Tuberous sclerosis     Past Surgical History  Procedure Laterality Date  . Abdominal hysterectomy    . Cataracts      BILATERAL  . Dilation and curettage of uterus    .   Back surgery    . Bladder surgery    . Colonoscopy    . Polypectomy     . Upper gastrointestinal endoscopy    . Cholecystectomy      Family History  Problem Relation Age of Onset  . Heart disease Mother   . Tuberous sclerosis Mother     Social History:  reports that she has quit smoking. She has never used smokeless tobacco. She reports that she does not drink alcohol or use illicit drugs.  Allergies:  Allergies  Allergen Reactions  . Sulfa Antibiotics      (Not in a hospital admission)  REVIEW OF SYSTEMS: A ROS was performed and pertinent positives and negatives are included in the history.  GENERAL: No fevers or chills. HEENT: No change in vision, no earache, sore throat or sinus congestion. NECK: No pain or stiffness. CARDIOVASCULAR: No chest pain or pressure. No palpitations. PULMONARY: No shortness of breath, cough or wheeze. GASTROINTESTINAL: No abdominal pain, nausea, vomiting or diarrhea, melena or bright red blood per rectum. GENITOURINARY: No urinary frequency, urgency, hesitancy or dysuria. MUSCULOSKELETAL: No joint or muscle pain, no back pain, no recent trauma. DERMATOLOGIC: No rash, no itching, no lesions. ENDOCRINE: No polyuria, polydipsia, no heat or cold intolerance. No recent change in weight. HEMATOLOGICAL: No anemia or easy bruising or bleeding. NEUROLOGIC: No headache, seizures, numbness, tingling or weakness. PSYCHIATRIC: No depression, no loss of interest in normal activity or change in sleep pattern.     Blood pressure 128/74.  Physical Exam:  HEENT:unremarkable Neck:Supple, midline, no thyroid megaly, no carotid bruits Lungs:  Clear to auscultation no rhonchi's or wheezes Heart:Regular rate and rhythm, no murmurs or gallops Breast Exam:examined sitting supine position were symmetrical in appearance no skin discoloration or palpable masses or tenderness Abdomen:soft nontender no rebound or guarding Pelvic:BUS as described above Vagina:as described above Cervix:absent Uterus:absent Adnexa:no tenderness or  masses Extremities: No cords, no edema Rectal:unremarkable   Assessment/Plan: Patient with squamous atypia the vaginal cuff and mild hyperplasia of several areas of the external genitalia with areas suspicious for condyloma acuminatum. Patient would like to treat this as an outpatient via laser. The risk benefits and pros and cons were discussed as follows:                        Patient was counseled as to the risk of surgery to include the following:  1. Infection (prohylactic antibiotics will be administered)  2. DVT/Pulmonary Embolism (prophylactic pneumo compression stockings will be used)  3.Trauma to internal organs requiring additional surgical procedure to repair any injury to     Internal organs requiring perhaps additional hospitalization days.  4.Hemmorhage requiring transfusion and blood products which carry risks such as  anaphylactic reaction, hepatitis and AIDS  Patient had received literature information on the procedure scheduled and all her questions were answered and fully accepts all risk.  Dereona Kolodny HMD5:34 PMTD@  Arya Boxley H 08/17/2012, 5:21 PM  

## 2012-09-01 NOTE — Transfer of Care (Signed)
Immediate Anesthesia Transfer of Care Note  Patient: [name]  Procedure(s) Performed: Procedure(s) (LRB): CO2 LASER APPLICATION (N/A)  Patient Location: PACU  Anesthesia Type: General  Level of Consciousness: awake, sedated, patient cooperative and responds to stimulation  Airway & Oxygen Therapy: Patient Spontanous Breathing and Patient connected to face mask oxygen  Post-op Assessment: Report given to PACU RN, Post -op Vital signs reviewed and stable and Patient moving all extremities  Post vital signs: Reviewed and stable  Complications: No apparent anesthesia complications

## 2012-09-01 NOTE — Anesthesia Postprocedure Evaluation (Signed)
Anesthesia Post Note  Patient: [name]  Procedure(s) Performed: Procedure(s) (LRB): CO2 LASER APPLICATION (N/A)  Anesthesia type: General  Patient location: PACU  Post pain: Pain level controlled  Post assessment: Post-op Vital signs reviewed  Last Vitals:  Filed Vitals:   09/01/12 0915  BP: 126/105  Pulse: 86  Temp:   Resp: 13    Post vital signs: Reviewed  Level of consciousness: sedated  Complications: No apparent anesthesia complications

## 2012-09-01 NOTE — Op Note (Signed)
09/01/2012  8:50 AM  PATIENT:  [name]  67 y.o. female  PRE-OPERATIVE DIAGNOSIS:  vaginal lesions, vulvar lesions  1. Vagina, biopsy, cuff - INFLAMMATION AND SLIGHT SQUAMOUS ATYPIA. - SEE MICROSCOPIC DESCRIPTION 2. Labium, biopsy, left, crease between majora/minora - MILD SQUAMOUS HYPERPLASIA AND HYPERKERATOSIS. - SEE MICROSCOPIC DESCRIPTION 3. Labium, biopsy, left  medial - MILD SQUAMOUS HYPERPLASIA AND HYPERKERATOSIS. - SEE MICROSCOPIC DESCRIPTION 4. Labium, biopsy, left majora lateral - MILD SQUAMOUS HYPERPLASIA AND HYPERKERATOSIS. - SEE MICROSCOPIC DESCRIPTION 5. Skin , right groin - SQUAMOUS HYPERPLASIA AND HYPERKERATOSIS. - SEE MICROSCOPIC DESCRIPTION Microscopic Comment 1. There is squamous mucosa with inflammation and associated squamous atypia including rare koilocytes. Much of the squamous atypia has features consistent with inflammatory and reactive atypia. Multiple additional levels are examined and the findings are similar. A p16 (HPV) stain is performed and there are rare cells positive in the superficial epithelium which may represent focal human papillomavirus cytopathic effect. No fungi are identified with PAS stain. 2. -5. There is slight, variable squamous hyperplasia associated with hyperkeratosis and there are occasional foci with koilocytic change. 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 There are no dysplastic changes and there is no evidence of malignancy  POST-OPERATIVE DIAGNOSIS:  vaginal cuff and vulvar hyperplasia changes highly suspicious for condyloma acuminatum. A right medial thigh tic was removed during prep. Perirectal leukoplakic lesion.  Perirectal leukoplakic area noted on colposcopic exam under general anesthesia  PROCEDURE: CO2 laser ablation of perirectal, left labia majora and minora and vaginal cuff and right groin hyperplastic  lesions and condyloma acuminatum. CO2 LASER APPLICATION  SURGEON:  Surgeon(s): Ok Edwards, MD  ANESTHESIA:   general  FINDINGS:Procedure(s): Slightly red erythematous lesion right vaginal cuff area biopsy obtained. Perirectal leukoplakic area at the 7:00 position noted on colposcopic exam was biopsied. CO2 laser ablation of right groin condylomatous lesion as well as ablation of lower pole of left labia majora and minora as described above. CO2 laser ablation of vaginal cuff as well as perirectal lesion noted after biopsy.   DESCRIPTION OF OPERATION: The patient was taken to the operating room where she underwent successful general endotracheal anesthesia. Timeout was accomplished identifying the patient in the appropriate procedure to be undertaken. Patient had just voided before being taken to the operating room. Her legs were then placed in high lithotomy position wet drapes were placed around the external genitalia. A titanium coated speculum was introduced into the vaginal vault a systematic inspection of the vagina demonstrated a slightly raised area at 3:00 position of the vaginal cuff (absent cervix with prior hysterectomy). A Kevorkian biopsy instrument was utilized to obtain a biopsy from this area that had previously been sampled. But a bigger sample was obtained this time. The CO2 laser attached to the colposcope was brought into view and was set at 6 W continuous mode and the vaginal cuff area was laser ablated to a depth of 3-4 mm. The rest of the vaginal tube did not demonstrate any lesions. Attention was then placed to the perirectal region whereby at the 7:00 position with the leukoplakic area which was biopsied as well and the area was laser ablated with the CO2 laser set at 80 W continuous mode to a depth of 3-4 mm. Attention was then placed the previously biopsied inferior portion of the Left labia majora and minora these areas were marked with a marking pen and the areas  previously biopsied were laser ablated with  the CO2 laser set at 8 W continuous mode for definite 3-4 mm. The rest of the external genitalia did not demonstrate any other lesions with the exception of the exophytic lesion the right groin area that appears pathology report at condylomatous changes. This lesion was ablated with the CO2 laser at 8 W continuous mode in a circumferential manner to a depth of 3-4 mm. Since patient is allergic to sulfa Silvadene was not place but instead Estrace cream was placed in the vaginal vault as well as the external genitalia and perirectal region there were ablated. The patient was extubated and transferred to recovery room stable vital signs no blood loss.  ESTIMATED BLOOD LOSS: none  Intake/Output Summary (Last 24 hours) at 09/01/12 0850 Last data filed at 09/01/12 0756  Gross per 24 hour  Intake   1000 ml  Output      0 ml  Net   1000 ml     BLOOD ADMINISTERED:none   LOCAL MEDICATIONS USED:  NONE  SPECIMEN:  Source of Specimen:  Perirectal biopsy 7:00 position. Vaginal cuff biopsy 3:00 position. Right medial thigh tic found during prepping.  DISPOSITION OF SPECIMEN:  PATHOLOGY  COUNTS:  YES  PLAN OF CARE: Transfer to PACU  [facility] HMD8:50 AMTD@

## 2012-09-01 NOTE — Interval H&P Note (Signed)
History and Physical Interval Note:  09/01/2012 7:17 AM  [name]  has presented today for surgery, with the diagnosis of vaginal lesions, vulvar lesions  The various methods of treatment have been discussed with the patient and family. After consideration of risks, benefits and other options for treatment, the patient has consented to  Procedure(s) with comments: CO2 LASER APPLICATION (N/A) - CPT 57065  CO2 laser of vaginal and vulvar lesions. as a surgical intervention .  The patient's history has been reviewed, patient examined, no change in status, stable for surgery.  I have reviewed the patient's chart and labs.  Questions were answered to the patient's satisfaction.     Ok Edwards

## 2012-09-01 NOTE — Interval H&P Note (Signed)
History and Physical Interval Note:  09/01/2012 7:16 AM  [name]  has presented today for surgery, with the diagnosis of vaginal lesions, vulvar lesions  The various methods of treatment have been discussed with the patient and family. After consideration of risks, benefits and other options for treatment, the patient has consented to  Procedure(s) with comments: CO2 LASER APPLICATION (N/A) - CPT 57065  CO2 laser of vaginal and vulvar lesions. as a surgical intervention .  The patient's history has been reviewed, patient examined, no change in status, stable for surgery.  I have reviewed the patient's chart and labs.  Questions were answered to the patient's satisfaction.     Ok Edwards

## 2012-09-01 NOTE — Anesthesia Procedure Notes (Signed)
Procedure Name: LMA Insertion Date/Time: 09/01/2012 7:41 AM Performed by: Jessica Priest Pre-anesthesia Checklist: Patient identified, Emergency Drugs available, Suction available and Patient being monitored Patient Re-evaluated:Patient Re-evaluated prior to inductionOxygen Delivery Method: Circle System Utilized Preoxygenation: Pre-oxygenation with 100% oxygen Intubation Type: IV induction Ventilation: Mask ventilation without difficulty LMA: LMA with gastric port inserted LMA Size: 4.0 Number of attempts: 1 Placement Confirmation: positive ETCO2 Tube secured with: Tape Dental Injury: Teeth and Oropharynx as per pre-operative assessment

## 2012-09-02 ENCOUNTER — Other Ambulatory Visit: Payer: Self-pay | Admitting: Gynecology

## 2012-09-02 ENCOUNTER — Other Ambulatory Visit: Payer: Self-pay

## 2012-09-02 ENCOUNTER — Encounter (HOSPITAL_BASED_OUTPATIENT_CLINIC_OR_DEPARTMENT_OTHER): Payer: Self-pay | Admitting: Gynecology

## 2012-09-02 ENCOUNTER — Telehealth: Payer: Self-pay

## 2012-09-02 LAB — URINE CULTURE

## 2012-09-02 MED ORDER — AZITHROMYCIN 250 MG PO TABS: ORAL_TABLET | ORAL | Status: DC

## 2012-09-02 MED ORDER — HYDROCHLOROTHIAZIDE 12.5 MG PO CAPS: 12.5000 mg | ORAL_CAPSULE | Freq: Every day | ORAL | Status: DC

## 2012-09-02 NOTE — Telephone Encounter (Signed)
Patient informed of all this. I had e-scribed her Rx to the [facility] on file but she asked me to send it to [facility].  I cancelled it at [facility] and phoned it in to new pharmacy. She will call in a week with her BP log.

## 2012-09-02 NOTE — Telephone Encounter (Signed)
Please call in HCTZ 12.5 MG and tell her that I want her to take one daily. Tell her to maintain a log of her BP's daily and send it to me in one week after having started the BP medicine.Stay on the medicine. Remind her to eat a banana daily

## 2012-09-02 NOTE — Telephone Encounter (Signed)
Patient said he BP was elevated yesterday in the OR and you commented on it.  She checked it at home throughout the day yesterday and it average 155/81.  She checked it this morning and it is 166/87.  She wanted me to check with you and see what you recommend. She thought you might want to put her on medication.

## 2012-09-03 ENCOUNTER — Telehealth: Payer: Self-pay

## 2012-09-03 NOTE — Telephone Encounter (Signed)
Patient informed. She said she felt better on two daily. She thinks she just got confused about dose yesterday. She plans to continue on 25 mg daily and will keep a BP log and check in with [place] in a week.

## 2012-09-03 NOTE — Telephone Encounter (Signed)
Please inform patient that I never told her to take 2 tablets. The recommendation is 12.5 mg 1 by mouth daily of the HCTZ. If she felt better taking 2 together which is equivalent to 25 mg daily and her blood pressure continued to remain normal were she's had that she can continue on that dose

## 2012-09-03 NOTE — Telephone Encounter (Signed)
Took two of the HCTZ 12.5 yesterday as directed. This am BP 132/79 heart rate was 89.  Took one HCTZ as directed this morning. This afternoon around half hour ago felt nauseated, dizzy and shakey and they checked and BP was 148/95 with a 99 heart beat.  Had a slight headache.  Came in and laid down and now feels almost back to normal. Not dizzy or shakey now.  Patient questioning if you want her to keep taking the HCTZ at one a day or does she maybe need to increase the dose. Felt pretty good  Yesterday with 2 daily. (patient can be reached on cell phone as she is not at home.)

## 2012-09-08 ENCOUNTER — Telehealth: Payer: Self-pay

## 2012-09-08 DIAGNOSIS — K449 Diaphragmatic hernia without obstruction or gangrene: Secondary | ICD-10-CM | POA: Diagnosis not present

## 2012-09-08 DIAGNOSIS — K219 Gastro-esophageal reflux disease without esophagitis: Secondary | ICD-10-CM | POA: Diagnosis not present

## 2012-09-08 DIAGNOSIS — K5730 Diverticulosis of large intestine without perforation or abscess without bleeding: Secondary | ICD-10-CM | POA: Diagnosis not present

## 2012-09-08 NOTE — Telephone Encounter (Signed)
You asked patient to keep a log of BP's and call you. She is taking 2 HCTZ tabs daily to equal 25 mg daily.  4/1- 155/81 4/2- 166/87 4/3-  130/92  157/96 4/4-  133/85  145/100 4/5- 139/85   161/93 4/6- 135/85   150/80 4/7- 130/85   176/102 4/8- 155/90   118/80  Patient mentioned to Toniann Fail when she dropped her readings off that she thinks her BP cuff might be too tight for her arm.

## 2012-09-08 NOTE — Telephone Encounter (Signed)
Tell her its still fluctuating and I would like for her to follow up with Dr. Jarold Motto her PCP because he may need to make an adjustment and ad another med for better control.

## 2012-09-09 NOTE — Telephone Encounter (Signed)
Pt informed with the below note. 

## 2012-09-15 ENCOUNTER — Ambulatory Visit (INDEPENDENT_AMBULATORY_CARE_PROVIDER_SITE_OTHER): Payer: Medicare Other | Admitting: Gynecology

## 2012-09-15 ENCOUNTER — Encounter: Payer: Self-pay | Admitting: Gynecology

## 2012-09-15 VITALS — BP 136/82

## 2012-09-15 DIAGNOSIS — Z09 Encounter for follow-up examination after completed treatment for conditions other than malignant neoplasm: Secondary | ICD-10-CM

## 2012-09-15 NOTE — Progress Notes (Signed)
Patient presented to the office for her two-week postop visit patient status post CO2 laser ablation of perirectal, left labia majora and minora and vaginal cuff and right groin hyperplastic lesions and condyloma acuminatum.  preop pathology report as follows: 1. Vagina, biopsy, cuff  - INFLAMMATION AND SLIGHT SQUAMOUS ATYPIA.  - SEE MICROSCOPIC DESCRIPTION  2. Labium, biopsy, left, crease between majora/minora  - MILD SQUAMOUS HYPERPLASIA AND HYPERKERATOSIS.  - SEE MICROSCOPIC DESCRIPTION  3. Labium, biopsy, left medial  - MILD SQUAMOUS HYPERPLASIA AND HYPERKERATOSIS.  - SEE MICROSCOPIC DESCRIPTION  4. Labium, biopsy, left majora lateral  - MILD SQUAMOUS HYPERPLASIA AND HYPERKERATOSIS.  - SEE MICROSCOPIC DESCRIPTION  5. Skin , right groin  - SQUAMOUS HYPERPLASIA AND HYPERKERATOSIS.   Findings at time of surgery: Slightly red erythematous lesion right vaginal cuff area biopsy obtained. Perirectal leukoplakic area at the 7:00 position noted on colposcopic exam was biopsied. CO2 laser ablation of right groin condylomatous lesion as well as ablation of lower pole of left labia majora and minora as described above. CO2 laser ablation of vaginal cuff as well as perirectal lesion noted after biopsy.  Pathology report as follows: Diagnosis 1. Skin , tick from right medial thigh - SUPERFICIAL FRAGMENTS OF SQUAMOUS MUCOSA WITH PARAKERATOSIS. - THERE IS NO EVIDENCE OF MALIGNANCY. 2. Vagina, biopsy, cuff - INFLAMED AND ULCERATED SQUAMOUS LINED MUCOSA. - THERE IS NO EVIDENCE OF MALIGNANCY. 3. Rectum, biopsy - POLYPOID SQUAMOUS LINED MUCOSA. - THERE IS NO EVIDENCE OF MALIGNANCY. - SEE COMMENT. Microscopic Comment 3. The findings may represent an acrochordon.  Patient is doing well some soreness and irritation as to be expected. The areas were healing nicely. She is is allergic to sulfa so we could not give her Silvadene cream. She is applying Westcort cream 0.2% at that time. She will  continue to do sitz baths or irrigation of the area before bedtime. During the day she may use 2% lidocaine gel when necessary. She'll return back to the office in one month for followup. Her blood pressure is doing well now that she's on Microzide 12.5 mg daily.

## 2012-09-21 ENCOUNTER — Telehealth: Payer: Self-pay | Admitting: *Deleted

## 2012-09-21 ENCOUNTER — Other Ambulatory Visit: Payer: Medicare Other

## 2012-09-21 DIAGNOSIS — E559 Vitamin D deficiency, unspecified: Secondary | ICD-10-CM | POA: Diagnosis not present

## 2012-09-21 NOTE — Telephone Encounter (Signed)
Please get her in to see Dr. Merla Riches or Dr. Marga Melnick for her HTN management. Thanks JF

## 2012-09-21 NOTE — Telephone Encounter (Signed)
(  Pt aware you are out of the office) This is a follow up from telephone encounter on 09/08/12 pt said that Dr. Jarold Motto office wont be able to see her until June regarding her HCTZ medication she is almost out of medication.   1.she is worried about the side effects from medication, c/o metal taste after taking medication, feels off balance when up moving around and even fell at work 1 time, shaky at times, tightness in her chest that comes & goes, memory problems as well.  2. She asked if you think okay for her to stop taking medication until June? Or would you be willing to give her another rx for another medication? Until she can see her PCP? Please advise

## 2012-09-21 NOTE — Telephone Encounter (Signed)
Have her stop the medication and please try to get her in with Dr. Merla Riches or one of his colleagues at [facility] or Dr. Marga Melnick

## 2012-09-22 ENCOUNTER — Other Ambulatory Visit: Payer: Self-pay | Admitting: Gynecology

## 2012-09-22 ENCOUNTER — Telehealth: Payer: Self-pay

## 2012-09-22 DIAGNOSIS — E559 Vitamin D deficiency, unspecified: Secondary | ICD-10-CM

## 2012-09-22 LAB — PTH, INTACT AND CALCIUM: Calcium, Total (PTH): 9.4 mg/dL (ref 8.4–10.5)

## 2012-09-22 MED ORDER — ERGOCALCIFEROL 1.25 MG (50000 UT) PO CAPS: ORAL_CAPSULE | ORAL | Status: DC

## 2012-09-22 NOTE — Telephone Encounter (Signed)
Appt May 2, 2:30pm with Dr. Clelia Croft at [facility].

## 2012-09-22 NOTE — Telephone Encounter (Signed)
Patient called to tell me that she rescheduled her appt with [facility] with Dr. Loreen Freud to Tuesday April  29 at 11:00am.

## 2012-09-22 NOTE — Telephone Encounter (Signed)
I called patient to let her know I scheduled her at Urgent Care or she could just walk-in today without appt she asked me about the doctor in [facility] that Dr Glenetta Hew Had rec.  That is Dr. Alwyn Ren however when I called his office he has closed his practice and is not taking new patients.  They made her an appt this Friday, April 25 with Dr.Yvonne Lowne to see her at 8:15.  I called patient and informed her and she said she cannot go on Fridays.  I provided her with the phone number so she can call and work appt with her schedule.  I called [facility] and cancelled that appt.  Patient was advised to d/c the medication that Dr. Glenetta Hew had prescribed.

## 2012-09-25 ENCOUNTER — Ambulatory Visit: Payer: Medicare Other | Admitting: Family Medicine

## 2012-09-26 ENCOUNTER — Emergency Department (HOSPITAL_BASED_OUTPATIENT_CLINIC_OR_DEPARTMENT_OTHER): Payer: Medicare Other

## 2012-09-26 ENCOUNTER — Emergency Department (HOSPITAL_BASED_OUTPATIENT_CLINIC_OR_DEPARTMENT_OTHER)
Admission: EM | Admit: 2012-09-26 | Discharge: 2012-09-26 | Disposition: A | Discharge status: Home / Self Care | Payer: Medicare Other | Attending: Emergency Medicine | Admitting: Emergency Medicine

## 2012-09-26 ENCOUNTER — Encounter (HOSPITAL_BASED_OUTPATIENT_CLINIC_OR_DEPARTMENT_OTHER): Payer: Self-pay | Admitting: *Deleted

## 2012-09-26 DIAGNOSIS — S92309A Fracture of unspecified metatarsal bone(s), unspecified foot, initial encounter for closed fracture: Secondary | ICD-10-CM | POA: Diagnosis not present

## 2012-09-26 DIAGNOSIS — Z87798 Personal history of other (corrected) congenital malformations: Secondary | ICD-10-CM | POA: Insufficient documentation

## 2012-09-26 DIAGNOSIS — Z79899 Other long term (current) drug therapy: Secondary | ICD-10-CM | POA: Insufficient documentation

## 2012-09-26 DIAGNOSIS — Z8619 Personal history of other infectious and parasitic diseases: Secondary | ICD-10-CM | POA: Insufficient documentation

## 2012-09-26 DIAGNOSIS — Z8669 Personal history of other diseases of the nervous system and sense organs: Secondary | ICD-10-CM | POA: Insufficient documentation

## 2012-09-26 DIAGNOSIS — Z8719 Personal history of other diseases of the digestive system: Secondary | ICD-10-CM | POA: Diagnosis not present

## 2012-09-26 DIAGNOSIS — Z8739 Personal history of other diseases of the musculoskeletal system and connective tissue: Secondary | ICD-10-CM | POA: Insufficient documentation

## 2012-09-26 DIAGNOSIS — Z8742 Personal history of other diseases of the female genital tract: Secondary | ICD-10-CM | POA: Insufficient documentation

## 2012-09-26 DIAGNOSIS — W010XXA Fall on same level from slipping, tripping and stumbling without subsequent striking against object, initial encounter: Secondary | ICD-10-CM | POA: Insufficient documentation

## 2012-09-26 DIAGNOSIS — K219 Gastro-esophageal reflux disease without esophagitis: Secondary | ICD-10-CM | POA: Insufficient documentation

## 2012-09-26 DIAGNOSIS — Z87891 Personal history of nicotine dependence: Secondary | ICD-10-CM | POA: Diagnosis not present

## 2012-09-26 DIAGNOSIS — Y9389 Activity, other specified: Secondary | ICD-10-CM | POA: Insufficient documentation

## 2012-09-26 DIAGNOSIS — S92301A Fracture of unspecified metatarsal bone(s), right foot, initial encounter for closed fracture: Secondary | ICD-10-CM

## 2012-09-26 DIAGNOSIS — Y929 Unspecified place or not applicable: Secondary | ICD-10-CM | POA: Insufficient documentation

## 2012-09-26 MED ORDER — HYDROCODONE-ACETAMINOPHEN 5-325 MG PO TABS: 2.0000 | ORAL_TABLET | ORAL | Status: DC | PRN

## 2012-09-26 NOTE — ED Provider Notes (Signed)
History  This chart was scribed for Samantha Octave, MD by Ardelia Mems, ED Scribe and Bennett Scrape, ED Scribe. This patient was seen in room MHT13/MHT13 and the patient's care was started at 3:56 PM.   CSN: [id number]  Arrival date & time 09/26/12  1435     Chief Complaint  Patient presents with  . Foot Injury    The history is provided by the patient. No language interpreter was used.   [name] is a 67 y.o. female who presents to the Emergency Department complaining of sudden worsening of ongoing right foot pain that began after a fall at work 2 weeks ago. She reports that the pain was mild initially until she slipped and fell in her yard today while getting the paper. She admits that the pain is worsened with ambulating and improved with rest. She denies head injury, neck pain and back pain as associated symptoms. She denies being on anticoagulants currently. She denies having a h/o DM.  Past Medical History  Diagnosis Date  . Arthritis   . Diverticulosis   . GERD (gastroesophageal reflux disease)   . Osteoporosis   . Tuberous sclerosis     EXTERNAL LESIONS--  MAINLY HANDS  . Seasonal allergies   . Vulvar dysplasia   . H/O hiatal hernia   . History of diverticulitis of colon   . Nodule of right lung     BENIGN---  CT  10-28-2008  . History of gastric ulcer   . Shingles     march 2014--  back  . History of epilepsy     childhood age 71 to 29  ---secondary to high calcium deposts of optic nerve---  none since age 64 with pregency  . OSA (obstructive sleep apnea)     mild-- no cpap  . IBS (irritable bowel syndrome)     Past Surgical History  Procedure Laterality Date  . Bladder surgery  1991    bladder reconstruction of torsion  . Colonoscopy    . Polypectomy    . Upper gastrointestinal endoscopy    . Cataract extraction w/ intraocular lens  implant, bilateral    . Cholecystectomy  11-18-2003  . Cardiac catheterization  06-26-2001    NORMAL CORONARY  ARTERIES  . Orif right bimalleolar ankle fx  12-18-2003  . 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 Lumbar disc surgery  1985  . Dilation and curettage of uterus  multiple -- last one 1975  . Vaginal hysterectomy  1975  . Hemorrhoid surgery  1970's  . Co2 laser application N/A 09/01/2012    Procedure: CO2 LASER APPLICATION;  Surgeon: Ok Edwards, MD;  Location: [facility];  Service: Gynecology;  Laterality: N/A;  CPT 57065  CO2 laser of vaginal and vulvar lesions.  . Back surgery      Family History  Problem Relation Age of Onset  . Heart disease Mother   . Tuberous sclerosis Mother     History  Substance Use Topics  . Smoking status: Former Smoker -- 2.50 packs/day for 20 years    Types: Cigarettes    Quit date: 08/28/1992  . Smokeless tobacco: Never Used  . Alcohol Use: No    No OB history provided.  Review of Systems  A complete 10 system review of systems was obtained and all systems are negative except as noted in the HPI and PMH.   Allergies  Percocet and Sulfa  antibiotics  Home Medications   Current Outpatient Rx  Name  Route  Sig  Dispense  Refill  . acyclovir (ZOVIRAX) 400 MG tablet   Oral   Take 400 mg by mouth as needed.          Marland Kitchen azithromycin (ZITHROMAX) 250 MG tablet      Take two tablets today and then one tablet daily for UTI.   6 tablet   0   . dexlansoprazole (DEXILANT) 60 MG capsule   Oral   Take 60 mg by mouth every morning.         . ergocalciferol (VITAMIN D2) 50000 UNITS capsule      Take one tablet every other week for 6 mos.   6 capsule   1   . hydrochlorothiazide (MICROZIDE) 12.5 MG capsule   Oral   Take 1 capsule (12.5 mg total) by mouth daily.   30 capsule   0   . hydrocortisone valerate cream (WESTCORT) 0.2 %   Topical   Apply 1 application topically 2 (two) times daily. To start after surgery, 09-01-2012         . ibandronate (BONIVA) 150 MG  tablet   Oral   Take 1 tablet (150 mg total) by mouth every 30 (thirty) days. Take in the morning with a full glass of water, on an empty stomach, and do not take anything else by mouth or lie down for the next 30 min.   1 tablet   11   . lidocaine (XYLOCAINE JELLY) 2 % jelly   Topical   Apply topically as needed.   30 mL   0   . metoCLOPramide (REGLAN) 10 MG tablet   Oral   Take 1 tablet (10 mg total) by mouth 3 (three) times daily with meals.   30 tablet   1   . oxyCODONE-acetaminophen (TYLOX) 5-500 MG per capsule   Oral   Take 1 capsule by mouth every 4 (four) hours as needed for pain.           Triage Vitals: BP 150/54  Pulse 100  Temp(Src) 98.1 F (36.7 C) (Oral)  Resp 18  Ht 5\' 2"  (1.575 m)  Wt 202 lb (91.627 kg)  BMI 36.94 kg/m2  SpO2 100%  Physical Exam  Nursing note and vitals reviewed. Constitutional: She is oriented to person, place, and time. She appears well-developed and well-nourished. No distress.  HENT:  Head: Normocephalic and atraumatic.  Eyes: EOM are normal.  Neck: Neck supple. No tracheal deviation present.  Cardiovascular: Normal rate.   Pulmonary/Chest: Effort normal. No respiratory distress.  Musculoskeletal: Normal range of motion.  DP and PT pulses normal Ankle, plantar and dorsiflexion intact No proximal fibular tenderness Tenderness at the base of the 5th metatarsal, achilles tendon intact  Neurological: She is alert and oriented to person, place, and time.  Skin: Skin is warm and dry.  Psychiatric: She has a normal mood and affect. Her behavior is normal.    ED Course  Procedures (including critical care time)  DIAGNOSTIC STUDIES: Oxygen Saturation is 100% on room air, normal by my interpretation.    COORDINATION OF CARE: 4:35 PM-Informed pt of radiology results. Discussed discharge plan which includes splint and crutches with pt and pt agreed to plan. Also advised pt to follow up with orthopedist and pt agreed.   Labs  Reviewed - No data to display  Dg Foot Complete Right  09/26/2012  *RADIOLOGY REPORT*  Clinical Data: Right foot injury and  pain.  RIGHT FOOT COMPLETE - 3+ VIEW  Comparison: 12/18/2003  Findings: A nondisplaced transverse fracture at the base of the fifth metatarsal is noted. There is no evidence of subluxation or dislocation. No other acute fractures are identified. Surgical hardware within the distal tibia and fibula are noted. The Lisfranc joints are intact.  IMPRESSION: Nondisplaced fracture at the base of the fifth metatarsal.   Original Report Authenticated By: Harmon Pier, M.D.      No diagnosis found.    MDM  Right foot injury x 2 weeks.  No weakness, numbness or tingling. Worse today after slipping on the grass.  X-ray shows nondisplaced base of the fifth metatarsal fracture. Neurovascular intact. Patient given splint, crutches, orthopedic followup.       I personally performed the services described in this documentation, which was scribed in my presence. The recorded information has been reviewed and is accurate.    Samantha Octave, MD 09/26/12 [phone]

## 2012-09-26 NOTE — ED Notes (Signed)
Pt c/o foot injury x 2 weeks ago

## 2012-09-28 DIAGNOSIS — S92309A Fracture of unspecified metatarsal bone(s), unspecified foot, initial encounter for closed fracture: Secondary | ICD-10-CM | POA: Diagnosis not present

## 2012-09-28 DIAGNOSIS — M25569 Pain in unspecified knee: Secondary | ICD-10-CM | POA: Diagnosis not present

## 2012-09-29 ENCOUNTER — Encounter: Payer: Self-pay | Admitting: Family Medicine

## 2012-09-29 ENCOUNTER — Ambulatory Visit (INDEPENDENT_AMBULATORY_CARE_PROVIDER_SITE_OTHER): Payer: Medicare Other | Admitting: Family Medicine

## 2012-09-29 VITALS — BP 124/78 | HR 96 | Temp 98.6°F | Wt 211.6 lb

## 2012-09-29 DIAGNOSIS — B029 Zoster without complications: Secondary | ICD-10-CM | POA: Diagnosis not present

## 2012-09-29 DIAGNOSIS — G4733 Obstructive sleep apnea (adult) (pediatric): Secondary | ICD-10-CM

## 2012-09-29 DIAGNOSIS — G4700 Insomnia, unspecified: Secondary | ICD-10-CM

## 2012-09-29 DIAGNOSIS — Z0000 Encounter for general adult medical examination without abnormal findings: Secondary | ICD-10-CM

## 2012-09-29 DIAGNOSIS — Q851 Tuberous sclerosis: Secondary | ICD-10-CM | POA: Diagnosis not present

## 2012-09-29 DIAGNOSIS — R030 Elevated blood-pressure reading, without diagnosis of hypertension: Secondary | ICD-10-CM

## 2012-09-29 MED ORDER — ZOSTER VACCINE LIVE 19400 UNT/0.65ML ~~LOC~~ SOLR: 0.6500 mL | Freq: Once | SUBCUTANEOUS | Status: DC

## 2012-09-29 MED ORDER — TRAZODONE HCL 50 MG PO TABS: 25.0000 mg | ORAL_TABLET | Freq: Every evening | ORAL | Status: DC | PRN

## 2012-09-29 MED ORDER — HYDROCODONE-ACETAMINOPHEN 5-325 MG PO TABS: 2.0000 | ORAL_TABLET | ORAL | Status: DC | PRN

## 2012-09-29 MED ORDER — VALACYCLOVIR HCL 1 G PO TABS: 1000.0000 mg | ORAL_TABLET | Freq: Three times a day (TID) | ORAL | Status: DC

## 2012-09-29 NOTE — Assessment & Plan Note (Signed)
Pt could not tolerate hctz Pt to watch salt in diet,  Diet and exercise Recheck 4-6 weeks

## 2012-09-29 NOTE — Patient Instructions (Addendum)
Tuberous Sclerosis Tuberous sclerosis is a rare genetic, neurological disorder. It is a multi-system disease. It can affect the brain, kidneys, heart, eyes, lungs, and other organs. Small benign tumors may grow on the face and eyes, as well as in the brain, kidneys, and other organs.  SYMPTOMS  Problems include:  Seizures. These most often begin in the first year of life.  Mental retardation.  Skin and eye lesions.  In some cases, neurobehavioral problems may also occur. Individuals with this disease may experience none or all of the symptoms with varying degrees of severity. DIAGNOSIS  Neuroimaging studies may be able to confirm the diagnosis. TREATMENT  There is no specific treatment for this disease. Treatment is symptomatic. It may include:  Anticonvulsant therapy for seizures.  Dermabrasion and laser removal techniques for the skin problems.  Drug therapy for neurobehavioral problems.  Treatment of high blood pressure caused by the kidney problems.  Surgery to remove growing tumors. PROGNOSIS  The prognosis varies depending on the severity of symptoms. There is no cure. Document Released: 05/10/2002 Document Revised: 08/12/2011 Document Reviewed: 05/20/2005 [facility] Patient Information 2013 [place], [place].

## 2012-09-29 NOTE — Assessment & Plan Note (Addendum)
Pt has appointment with neuro at [facility]\ Refill pain meds

## 2012-09-29 NOTE — Assessment & Plan Note (Signed)
rx for vaccine given to pt

## 2012-09-29 NOTE — Progress Notes (Signed)
  Subjective:    Patient ID: [name], female    DOB: 1945/08/08, 67 y.o.   MRN: [id number]  HPI Pt is here to establish.  She has hx of tubular sclerosis and have appointment with neuro.  She ws found to have elevated bp in gyn office and she was started on med but she could not tolerate it.   She also has a hx of recurrent shingles---she is requesting shingles vaccine.    Review of Systems As above    Objective:   Physical Exam BP 124/78  Pulse 96  Temp(Src) 98.6 F (37 C) (Oral)  Wt 211 lb 9.6 oz (95.981 kg)  BMI 38.69 kg/m2  SpO2 99% General appearance: alert, cooperative, appears stated age and no distress Neck: no adenopathy, supple, symmetrical, trachea midline and thyroid not enlarged, symmetric, no tenderness/mass/nodules Lungs: clear to auscultation bilaterally Heart: S1, S2 normal Extremities: extremities normal, atraumatic, no cyanosis or edema       Assessment & Plan:

## 2012-10-02 ENCOUNTER — Ambulatory Visit: Payer: Medicare Other | Admitting: Family Medicine

## 2012-10-02 ENCOUNTER — Telehealth: Payer: Self-pay | Admitting: Family Medicine

## 2012-10-02 NOTE — Telephone Encounter (Signed)
Pt needs another disability placard signed form - she lost the one she got on Monday- she needs someone to call her when ready she is going to send someone to pick it up

## 2012-10-02 NOTE — Telephone Encounter (Signed)
New placard form given to the patient.     KP

## 2012-10-12 ENCOUNTER — Ambulatory Visit (INDEPENDENT_AMBULATORY_CARE_PROVIDER_SITE_OTHER): Payer: Medicare Other | Admitting: Gynecology

## 2012-10-12 ENCOUNTER — Encounter: Payer: Self-pay | Admitting: Gynecology

## 2012-10-12 VITALS — BP 136/80

## 2012-10-12 DIAGNOSIS — Z09 Encounter for follow-up examination after completed treatment for conditions other than malignant neoplasm: Secondary | ICD-10-CM

## 2012-10-12 DIAGNOSIS — N952 Postmenopausal atrophic vaginitis: Secondary | ICD-10-CM

## 2012-10-12 DIAGNOSIS — S92309A Fracture of unspecified metatarsal bone(s), unspecified foot, initial encounter for closed fracture: Secondary | ICD-10-CM | POA: Diagnosis not present

## 2012-10-12 MED ORDER — NONFORMULARY OR COMPOUNDED ITEM: Status: DC

## 2012-10-12 NOTE — Progress Notes (Signed)
Patient presented to the office for her six-week postop appointment. Patient is status post CO2 laser ablation of perirectal, left labia majora and minora and vaginal cuff and right groin hyperplastic lesions and condyloma acuminatum.   preop pathology report as follows:  1. Vagina, biopsy, cuff  - INFLAMMATION AND SLIGHT SQUAMOUS ATYPIA.  - SEE MICROSCOPIC DESCRIPTION  2. Labium, biopsy, left, crease between majora/minora  - MILD SQUAMOUS HYPERPLASIA AND HYPERKERATOSIS.  - SEE MICROSCOPIC DESCRIPTION  3. Labium, biopsy, left medial  - MILD SQUAMOUS HYPERPLASIA AND HYPERKERATOSIS.  - SEE MICROSCOPIC DESCRIPTION  4. Labium, biopsy, left majora lateral  - MILD SQUAMOUS HYPERPLASIA AND HYPERKERATOSIS.  - SEE MICROSCOPIC DESCRIPTION  5. Skin , right groin  - SQUAMOUS HYPERPLASIA AND HYPERKERATOSIS.  Patient is doing well some slight brownish discharge in frequently. A small year in the left labia majora is almost completely healed otherwise all the areas of the external genitalia completely healed. There is a raw area in the vaginal cuff 3:00 position probably from the retractor at time of surgery . Silver nitrate was applied to this area.Because of her vaginal atrophy she will be placed on estradiol vaginal cream to apply twice a week. She'll return back to the office in 3 months for followup. For the left labia majora area that it has not completely healed she'll continue to do the sitz baths and apply Neosporin before bedtime.

## 2012-10-27 ENCOUNTER — Encounter: Payer: Self-pay | Admitting: Pulmonary Disease

## 2012-10-27 ENCOUNTER — Ambulatory Visit (INDEPENDENT_AMBULATORY_CARE_PROVIDER_SITE_OTHER): Payer: Medicare Other | Admitting: Pulmonary Disease

## 2012-10-27 VITALS — BP 122/80 | HR 64 | Temp 98.1°F | Ht 62.25 in | Wt 206.0 lb

## 2012-10-27 DIAGNOSIS — G478 Other sleep disorders: Secondary | ICD-10-CM | POA: Diagnosis not present

## 2012-10-27 NOTE — Patient Instructions (Addendum)
Will schedule you for a sleep study to see if sleep apnea or other sleep disorder may be contributing to your sleep issues. Will arrange followup once the results are available.

## 2012-10-27 NOTE — Progress Notes (Signed)
Subjective:    Patient ID: [name], female    DOB: Jun 26, 1945, 67 y.o.   MRN: [id number]  HPI  The patient is a 67 year old female who I've been asked to see for ongoing sleep issues.  The patient has had issues with sleep onset and maintenance since 1998, and tells me that she has been on sleeping medication since that time.  If she takes her sleeping pill, she will get to sleep quickly, but still has fairly frequent awakenings during the night to go to the bathroom.  She has been told that she is a loud snorer, but currently lives alone and no one has mentioned hearing an abnormal breathing pattern during sleep.  The patient does not feel rested in the mornings upon arising, but only notes occasional sleep pressure during the day with inactivity.  She denies any sleepiness in the evenings watching television, and has no sleepiness driving her Epworth score today is only 4.  She apparently had a sleep study in 1998 was told that she may have mild sleep apnea, but she has gained over 100 pounds since that time.  She denies any classic restless leg symptoms in the evenings, but tells me that her legs do hurt when she gets in bed in the evenings.  Movement sometimes makes her legs feel better.  She is unsure if she kicks during the night.   Questionnaire What time do you typically go to bed?( Between what hours) 9-10p 9-10p at 1553 on 10/27/12 by Nita Sells, CMA How long does it take you to fall asleep? with medication 20-30mins>>>without medication pt states she does not go to sleep with medication 20-30mins>>>without medication pt states she does not go to sleep at 1553 on 10/27/12 by Nita Sells, CMA How many times during the night do you wake up? 3 3 at 1553 on 10/27/12 by Nita Sells, CMA What time do you get out of bed to start your day? 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 (In pounds) 0 oz (0 kg) 0 oz (0 kg) at 1553 on 10/27/12 by Nita Sells, CMA Have you ever had a sleep study before? Yes Yes at 1553 on 10/27/12 by Nita Sells, CMA If yes, location of study? [place], [place] [address] pt states office no longer there. [place] [address] pt states office no longer there. at 1553 on 10/27/12 by Nita Sells, CMA If yes, date of study? [id number] 1995-1998 at 1553 on 10/27/12 by Nita Sells, CMA Do you currently use CPAP? No No at 1553 on 10/27/12 by Marjo Bicker Mabe, CMA Do you wear oxygen at any time? No No at 1553 on 10/27/12 by Marjo Bicker Mabe, CMA   Review of Systems  Constitutional: Positive for appetite change. Negative for fever and unexpected weight change.  HENT: Negative for ear pain, nosebleeds, congestion, sore throat, rhinorrhea, sneezing, trouble swallowing, dental problem, postnasal drip and sinus pressure.   Eyes: Negative for redness and itching.  Respiratory: Negative for cough, chest tightness, shortness of breath and wheezing.   Cardiovascular: Negative for palpitations and leg swelling.  Gastrointestinal: Negative for nausea and vomiting.       Acid heartburn  Genitourinary: Negative for dysuria.  Musculoskeletal: Negative for  joint swelling.  Skin: Negative for rash.  Neurological: Negative for headaches.  Hematological: Does not bruise/bleed easily.  Psychiatric/Behavioral: Negative for dysphoric mood. The patient is not nervous/anxious.        Objective:   Physical Exam Constitutional:  Obese female, no acute distress  HENT:  Nares patent without discharge  Oropharynx without exudate, palate and uvula are mildly elongated.   Eyes:  Perrla, eomi, no scleral icterus  Neck:  No JVD, no TMG  Cardiovascular:  Normal rate, regular rhythm, no rubs or gallops.  No murmurs         Intact distal pulses  Pulmonary :  Normal breath sounds, no stridor or respiratory distress   No rales, rhonchi, or wheezing  Abdominal:  Soft, nondistended, bowel sounds present.  No tenderness noted.   Musculoskeletal:  mild lower extremity edema noted.  Lymph Nodes:  No cervical lymphadenopathy noted  Skin:  No cyanosis noted  Neurologic:  Alert, but does appear tired, moves all 4 extremities without obvious deficit.         Assessment & Plan:

## 2012-10-27 NOTE — Assessment & Plan Note (Signed)
The patient has a history of possible obstructive sleep apnea in the past, and has gained 100 pounds since that time.  She is now having frequent awakenings at night, as well as nonrestorative sleep.  She certainly has a component of insomnia to all of this, but I am suspicious that sleep disordered breathing may also be contributing.  She also has leg discomfort at night when she goes to bed, and it is unclear whether this is musculoskeletal, possibly related to the periodic limb movement disorder.  I think she would benefit from a followup sleep study, and the patient is agreeable.  If she does not have sleep apnea or other sleep disorder that is disrupting her sleep, I would strongly encourage a referral to a behavioral specialist for cognitive behavioral therapy for treatment of her insomnia.

## 2012-11-02 ENCOUNTER — Telehealth: Payer: Self-pay | Admitting: Family Medicine

## 2012-11-02 DIAGNOSIS — S92309A Fracture of unspecified metatarsal bone(s), unspecified foot, initial encounter for closed fracture: Secondary | ICD-10-CM | POA: Diagnosis not present

## 2012-11-02 NOTE — Telephone Encounter (Signed)
Pt stated that she has tried two trazadone at night and it did not work. Please advise.

## 2012-11-02 NOTE — Telephone Encounter (Signed)
Try 1 1/2 traz at night

## 2012-11-02 NOTE — Telephone Encounter (Signed)
It is not safe to give other meds for sleep with hx sleep apnea-- Dr Shelle Iron did not mention increasing sleep med in ov--- 2 trazadone is as high as i/ll go and with hx sleep apnea and 100 lb weight gain --- bzd not a good option

## 2012-11-02 NOTE — Telephone Encounter (Signed)
Pt was in to see Dr. Shelle Iron on 10-27-2012. Per office note does not mention increasing sleep medication. See copied portion below:   The patient has a history of possible obstructive sleep apnea in the past, and has gained 100 pounds since that time. She is now having frequent awakenings at night, as well as nonrestorative sleep. She certainly has a component of insomnia to all of this, but I am suspicious that sleep disordered breathing may also be contributing. She also has leg discomfort at night when she goes to bed, and it is unclear whether this is musculoskeletal, possibly related to the periodic limb movement disorder. I think she would benefit from a followup sleep study, and the patient is agreeable. If she does not have sleep apnea or other sleep disorder that is disrupting her sleep, I would strongly encourage a referral to a behavioral specialist for cognitive behavioral therapy for treatment of her insomnia.      Please advise.

## 2012-11-02 NOTE — Telephone Encounter (Signed)
Left a detailed message informing pt.  

## 2012-11-02 NOTE — Telephone Encounter (Signed)
Patient is calling in regards to her current sleep medication. States that her current medication is not working for her at all and was told by Dr. Shelle Iron to call her primary doctor to discuss increasing her dosage before her sleep study.

## 2012-11-10 DIAGNOSIS — R413 Other amnesia: Secondary | ICD-10-CM | POA: Diagnosis not present

## 2012-11-10 DIAGNOSIS — Q851 Tuberous sclerosis: Secondary | ICD-10-CM | POA: Diagnosis not present

## 2012-11-10 DIAGNOSIS — R51 Headache: Secondary | ICD-10-CM | POA: Diagnosis not present

## 2012-11-16 ENCOUNTER — Encounter: Payer: Self-pay | Admitting: Lab

## 2012-11-16 DIAGNOSIS — Z79899 Other long term (current) drug therapy: Secondary | ICD-10-CM | POA: Diagnosis not present

## 2012-11-17 ENCOUNTER — Encounter: Payer: Self-pay | Admitting: Family Medicine

## 2012-11-17 ENCOUNTER — Ambulatory Visit (INDEPENDENT_AMBULATORY_CARE_PROVIDER_SITE_OTHER): Payer: Medicare Other | Admitting: Family Medicine

## 2012-11-17 VITALS — BP 122/70 | HR 77 | Temp 98.9°F | Ht 63.0 in | Wt 212.6 lb

## 2012-11-17 DIAGNOSIS — E538 Deficiency of other specified B group vitamins: Secondary | ICD-10-CM | POA: Diagnosis not present

## 2012-11-17 DIAGNOSIS — G4700 Insomnia, unspecified: Secondary | ICD-10-CM

## 2012-11-17 DIAGNOSIS — Q851 Tuberous sclerosis: Secondary | ICD-10-CM | POA: Diagnosis not present

## 2012-11-17 DIAGNOSIS — R413 Other amnesia: Secondary | ICD-10-CM | POA: Diagnosis not present

## 2012-11-17 DIAGNOSIS — Z0000 Encounter for general adult medical examination without abnormal findings: Secondary | ICD-10-CM

## 2012-11-17 DIAGNOSIS — Z23 Encounter for immunization: Secondary | ICD-10-CM | POA: Diagnosis not present

## 2012-11-17 DIAGNOSIS — Z136 Encounter for screening for cardiovascular disorders: Secondary | ICD-10-CM | POA: Diagnosis not present

## 2012-11-17 LAB — CBC WITH DIFFERENTIAL/PLATELET
Basophils Relative: 1.1 % (ref 0.0–3.0)
Eosinophils Absolute: 0.2 10*3/uL (ref 0.0–0.7)
Hemoglobin: 13.8 g/dL (ref 12.0–15.0)
Lymphs Abs: 1.3 10*3/uL (ref 0.7–4.0)
MCHC: 33.7 g/dL (ref 30.0–36.0)
MCV: 93.5 fl (ref 78.0–100.0)
Monocytes Absolute: 0.3 10*3/uL (ref 0.1–1.0)
Neutro Abs: 3 10*3/uL (ref 1.4–7.7)
RBC: 4.38 Mil/uL (ref 3.87–5.11)

## 2012-11-17 MED ORDER — CYANOCOBALAMIN 1000 MCG/ML IJ SOLN
1000.0000 ug | Freq: Once | INTRAMUSCULAR | Status: AC
  Administered 2012-11-17: 1000 ug via INTRAMUSCULAR

## 2012-11-17 MED ORDER — ZOSTER VACCINE LIVE 19400 UNT/0.65ML ~~LOC~~ SOLR: 0.6500 mL | Freq: Once | SUBCUTANEOUS | Status: DC

## 2012-11-17 MED ORDER — TRAZODONE HCL 50 MG PO TABS: 100.0000 mg | ORAL_TABLET | Freq: Every day | ORAL | Status: DC

## 2012-11-17 NOTE — Assessment & Plan Note (Signed)
Per neuro 

## 2012-11-17 NOTE — Progress Notes (Signed)
Subjective:    [name] is a 67 y.o. female who presents for a welcome to Medicare exam.   Cardiac risk factors: advanced age (older than 81 for men, 1 for women), obesity (BMI >= 30 kg/m2) and sedentary lifestyle.  Activities of Daily Living  In your present state of health, do you have any difficulty performing the following activities?:  Preparing food and eating?: No Bathing yourself: No Getting dressed: No Using the toilet:No Moving around from place to place: No In the past year have you fallen or had a near fall?:No  Current exercise habits: no exercise secondary to fractured metatarsal   Dietary issues discussed: none   Depression Screen (Note: if answer to either of the following is "Yes", then a more complete depression screening is indicated)  Q1: Over the past two weeks, have you felt down, depressed or hopeless?no Q2: Over the past two weeks, have you felt little interest or pleasure in doing things? no   The following portions of the patient's history were reviewed and updated as appropriate:  She  has a past medical history of Arthritis; Diverticulosis; GERD (gastroesophageal reflux disease); Osteoporosis; Tuberous sclerosis; Seasonal allergies; Vulvar dysplasia; H/O hiatal hernia; History of diverticulitis of colon; Nodule of right lung; History of gastric ulcer; Shingles; History of epilepsy; OSA (obstructive sleep apnea); IBS (irritable bowel syndrome); and Seizure. She  does not have any pertinent problems on file. She  has past surgical history that includes Bladder surgery (1991); Colonoscopy; Polypectomy; Upper gastrointestinal endoscopy; Cataract extraction w/ intraocular lens  implant, bilateral; Cholecystectomy (11-18-2003); Cardiac catheterization (06-26-2001); ORIF RIGHT BIMALLEOLAR ANKLE FX (12-18-2003); WIDE LOCAL EXCISION OF FOURCHETTE AND PERINEUM (04-06-2009); Lumbar disc surgery (1985); Dilation and curettage of uterus (multiple -- last one  1975); Vaginal hysterectomy (1975); Hemorrhoid surgery (1970's); Co2 laser application (N/A, 09/01/2012); and Back surgery. Her family history includes Heart disease in her mother and Tuberous sclerosis in her cousin, daughter, maternal aunts, maternal grandmother, mother, sister, and son. She  reports that she quit smoking about 20 years ago. Her smoking use included Cigarettes. She has a 50 pack-year smoking history. She has never used smokeless tobacco. She reports that  drinks alcohol. She reports that she does not use illicit drugs. She has a current medication list which includes the following prescription(s): dexlansoprazole, ergocalciferol, probiotic product, trazodone, and valacyclovir. Current Outpatient Prescriptions on File Prior to Visit  Medication Sig Dispense Refill  . dexlansoprazole (DEXILANT) 60 MG capsule Take 60 mg by mouth every morning.      . ergocalciferol (VITAMIN D2) 50000 UNITS capsule Take one tablet every other week for 6 mos.  6 capsule  1  . traZODone (DESYREL) 50 MG tablet Take 0.5-1 tablets (25-50 mg total) by mouth at bedtime as needed for sleep.  30 tablet  3  . valACYclovir (VALTREX) 1000 MG tablet Take 1 tablet (1,000 mg total) by mouth 3 (three) times daily.  21 tablet  0   No current facility-administered medications on file prior to visit.   She is allergic to percocet; zoloft; and sulfa antibiotics.. Review of Systems Review of Systems  Constitutional: Negative for activity change, appetite change and fatigue.  HENT: Negative for hearing loss, congestion, tinnitus and ear discharge.  dentist-no Eyes: Negative for visual disturbance (see optho q1y -- vision corrected to 20/20 with glasses).  Respiratory: Negative for cough, chest tightness and shortness of breath.   Cardiovascular: Negative for chest pain, palpitations and leg swelling.  Gastrointestinal: Negative for abdominal pain, diarrhea,  constipation and abdominal distention.  Genitourinary: Negative  for urgency, frequency, decreased urine volume and difficulty urinating.  Musculoskeletal: Negative for back pain, arthralgias and gait problem.  Skin: Negative for color change, pallor and rash.  Neurological: Negative for dizziness, light-headedness, numbness and headaches.  Hematological: Negative for adenopathy. Does not bruise/bleed easily.  Psychiatric/Behavioral: Negative for suicidal ideas, confusion, sleep disturbance, self-injury, dysphoric mood, decreased concentration and agitation.        Objective:     Vision by Snellen chart--opth Blood pressure 122/70, pulse 77, temperature 98.9 F (37.2 C), temperature source Oral, height 5\' 3"  (1.6 m), weight 212 lb 9.6 oz (96.435 kg), SpO2 98.00%. Body mass index is 37.67 kg/(m^2). BP 122/70  Pulse 77  Temp(Src) 98.9 F (37.2 C) (Oral)  Ht 5\' 3"  (1.6 m)  Wt 212 lb 9.6 oz (96.435 kg)  BMI 37.67 kg/m2  SpO2 98% General appearance: alert, cooperative, appears stated age and no distress Head: Normocephalic, without obvious abnormality, atraumatic Eyes: conjunctivae/corneas clear. PERRL, EOM's intact. Fundi benign. Ears: normal TM's and external ear canals both ears Nose: Nares normal. Septum midline. Mucosa normal. No drainage or sinus tenderness. Throat: lips, mucosa, and tongue normal; teeth and gums normal Neck: no adenopathy, no carotid bruit, no JVD, supple, symmetrical, trachea midline and thyroid not enlarged, symmetric, no tenderness/mass/nodules Back: symmetric, no curvature. ROM normal. No CVA tenderness. Lungs: clear to auscultation bilaterally Breasts: gyn Heart: regular rate and rhythm, S1, S2 normal, no murmur, click, rub or gallop Abdomen: soft, non-tender; bowel sounds normal; no masses,  no organomegaly Pelvic: deferred--gyn Extremities: extremities normal, atraumatic, no cyanosis or edema Pulses: 2+ and symmetric Skin: Skin color, texture, turgor normal. No rashes or lesions Lymph nodes: Cervical,  supraclavicular, and axillary nodes normal. Neurologic: Alert and oriented X 3, normal strength and tone. Normal symmetric reflexes. Normal coordination and gait    Assessment:     cpe      Plan:     During the course of the visit the patient was educated and counseled about appropriate screening and preventive services including:   Pneumococcal vaccine   Influenza vaccine  Td vaccine  Screening mammography  Screening Pap smear and pelvic exam   Bone densitometry screening  Colorectal cancer screening  Diabetes screening  Glaucoma screening  Advanced directives: has an advanced directive - a copy HAS NOT been provided.  Patient Instructions (the written plan) was given to the patient.

## 2012-11-17 NOTE — Patient Instructions (Addendum)
Preventive Care for Adults, Female A healthy lifestyle and preventive care can promote health and wellness. Preventive health guidelines for women include the following key practices.  A routine yearly physical is a good way to check with your caregiver about your health and preventive screening. It is a chance to share any concerns and updates on your health, and to receive a thorough exam.  Visit your dentist for a routine exam and preventive care every 6 months. Brush your teeth twice a day and floss once a day. Good oral hygiene prevents tooth decay and gum disease.  The frequency of eye exams is based on your age, health, family medical history, use of contact lenses, and other factors. Follow your caregiver's recommendations for frequency of eye exams.  Eat a healthy diet. Foods like vegetables, fruits, whole grains, low-fat dairy products, and lean protein foods contain the nutrients you need without too many calories. Decrease your intake of foods high in solid fats, added sugars, and salt. Eat the right amount of calories for you.Get information about a proper diet from your caregiver, if necessary.  Regular physical exercise is one of the most important things you can do for your health. Most adults should get at least 150 minutes of moderate-intensity exercise (any activity that increases your heart rate and causes you to sweat) each week. In addition, most adults need muscle-strengthening exercises on 2 or more days a week.  Maintain a healthy weight. The body mass index (BMI) is a screening tool to identify possible weight problems. It provides an estimate of body fat based on height and weight. Your caregiver can help determine your BMI, and can help you achieve or maintain a healthy weight.For adults 20 years and older:  A BMI below 18.5 is considered underweight.  A BMI of 18.5 to 24.9 is normal.  A BMI of 25 to 29.9 is considered overweight.  A BMI of 30 and above is  considered obese.  Maintain normal blood lipids and cholesterol levels by exercising and minimizing your intake of saturated fat. Eat a balanced diet with plenty of fruit and vegetables. Blood tests for lipids and cholesterol should begin at age 20 and be repeated every 5 years. If your lipid or cholesterol levels are high, you are over 50, or you are at high risk for heart disease, you may need your cholesterol levels checked more frequently.Ongoing high lipid and cholesterol levels should be treated with medicines if diet and exercise are not effective.  If you smoke, find out from your caregiver how to quit. If you do not use tobacco, do not start.  If you are pregnant, do not drink alcohol. If you are breastfeeding, be very cautious about drinking alcohol. If you are not pregnant and choose to drink alcohol, do not exceed 1 drink per day. One drink is considered to be 12 ounces (355 mL) of beer, 5 ounces (148 mL) of wine, or 1.5 ounces (44 mL) of liquor.  Avoid use of street drugs. Do not share needles with anyone. Ask for help if you need support or instructions about stopping the use of drugs.  High blood pressure causes heart disease and increases the risk of stroke. Your blood pressure should be checked at least every 1 to 2 years. Ongoing high blood pressure should be treated with medicines if weight loss and exercise are not effective.  If you are 55 to 65 years old, ask your caregiver if you should take aspirin to prevent strokes.  Diabetes   screening involves taking a blood sample to check your fasting blood sugar level. This should be done once every 3 years, after age 45, if you are within normal weight and without risk factors for diabetes. Testing should be considered at a younger age or be carried out more frequently if you are overweight and have at least 1 risk factor for diabetes.  Breast cancer screening is essential preventive care for women. You should practice "breast  self-awareness." This means understanding the normal appearance and feel of your breasts and may include breast self-examination. Any changes detected, no matter how small, should be reported to a caregiver. Women in their 20s and 30s should have a clinical breast exam (CBE) by a caregiver as part of a regular health exam every 1 to 3 years. After age 40, women should have a CBE every year. Starting at age 40, women should consider having a mammography (breast X-ray test) every year. Women who have a family history of breast cancer should talk to their caregiver about genetic screening. Women at a high risk of breast cancer should talk to their caregivers about having magnetic resonance imaging (MRI) and a mammography every year.  The Pap test is a screening test for cervical cancer. A Pap test can show cell changes on the cervix that might become cervical cancer if left untreated. A Pap test is a procedure in which cells are obtained and examined from the lower end of the uterus (cervix).  Women should have a Pap test starting at age 21.  Between ages 21 and 29, Pap tests should be repeated every 2 years.  Beginning at age 30, you should have a Pap test every 3 years as long as the past 3 Pap tests have been normal.  Some women have medical problems that increase the chance of getting cervical cancer. Talk to your caregiver about these problems. It is especially important to talk to your caregiver if a new problem develops soon after your last Pap test. In these cases, your caregiver may recommend more frequent screening and Pap tests.  The above recommendations are the same for women who have or have not gotten the vaccine for human papillomavirus (HPV).  If you had a hysterectomy for a problem that was not cancer or a condition that could lead to cancer, then you no longer need Pap tests. Even if you no longer need a Pap test, a regular exam is a good idea to make sure no other problems are  starting.  If you are between ages 65 and 70, and you have had normal Pap tests going back 10 years, you no longer need Pap tests. Even if you no longer need a Pap test, a regular exam is a good idea to make sure no other problems are starting.  If you have had past treatment for cervical cancer or a condition that could lead to cancer, you need Pap tests and screening for cancer for at least 20 years after your treatment.  If Pap tests have been discontinued, risk factors (such as a new sexual partner) need to be reassessed to determine if screening should be resumed.  The HPV test is an additional test that may be used for cervical cancer screening. The HPV test looks for the virus that can cause the cell changes on the cervix. The cells collected during the Pap test can be tested for HPV. The HPV test could be used to screen women aged 30 years and older, and should   be used in women of any age who have unclear Pap test results. After the age of 30, women should have HPV testing at the same frequency as a Pap test.  Colorectal cancer can be detected and often prevented. Most routine colorectal cancer screening begins at the age of 50 and continues through age 75. However, your caregiver may recommend screening at an earlier age if you have risk factors for colon cancer. On a yearly basis, your caregiver may provide home test kits to check for hidden blood in the stool. Use of a small camera at the end of a tube, to directly examine the colon (sigmoidoscopy or colonoscopy), can detect the earliest forms of colorectal cancer. Talk to your caregiver about this at age 50, when routine screening begins. Direct examination of the colon should be repeated every 5 to 10 years through age 75, unless early forms of pre-cancerous polyps or small growths are found.  Hepatitis C blood testing is recommended for all people born from 1945 through 1965 and any individual with known risks for hepatitis C.  Practice  safe sex. Use condoms and avoid high-risk sexual practices to reduce the spread of sexually transmitted infections (STIs). STIs include gonorrhea, chlamydia, syphilis, trichomonas, herpes, HPV, and human immunodeficiency virus (HIV). Herpes, HIV, and HPV are viral illnesses that have no cure. They can result in disability, cancer, and death. Sexually active women aged 25 and younger should be checked for chlamydia. Older women with new or multiple partners should also be tested for chlamydia. Testing for other STIs is recommended if you are sexually active and at increased risk.  Osteoporosis is a disease in which the bones lose minerals and strength with aging. This can result in serious bone fractures. The risk of osteoporosis can be identified using a bone density scan. Women ages 65 and over and women at risk for fractures or osteoporosis should discuss screening with their caregivers. Ask your caregiver whether you should take a calcium supplement or vitamin D to reduce the rate of osteoporosis.  Menopause can be associated with physical symptoms and risks. Hormone replacement therapy is available to decrease symptoms and risks. You should talk to your caregiver about whether hormone replacement therapy is right for you.  Use sunscreen with sun protection factor (SPF) of 30 or more. Apply sunscreen liberally and repeatedly throughout the day. You should seek shade when your shadow is shorter than you. Protect yourself by wearing long sleeves, pants, a wide-brimmed hat, and sunglasses year round, whenever you are outdoors.  Once a month, do a whole body skin exam, using a mirror to look at the skin on your back. Notify your caregiver of new moles, moles that have irregular borders, moles that are larger than a pencil eraser, or moles that have changed in shape or color.  Stay current with required immunizations.  Influenza. You need a dose every fall (or winter). The composition of the flu vaccine  changes each year, so being vaccinated once is not enough.  Pneumococcal polysaccharide. You need 1 to 2 doses if you smoke cigarettes or if you have certain chronic medical conditions. You need 1 dose at age 65 (or older) if you have never been vaccinated.  Tetanus, diphtheria, pertussis (Tdap, Td). Get 1 dose of Tdap vaccine if you are younger than age 65, are over 65 and have contact with an infant, are a healthcare worker, are pregnant, or simply want to be protected from whooping cough. After that, you need a Td   booster dose every 10 years. Consult your caregiver if you have not had at least 3 tetanus and diphtheria-containing shots sometime in your life or have a deep or dirty wound.  HPV. You need this vaccine if you are a woman age 26 or younger. The vaccine is given in 3 doses over 6 months.  Measles, mumps, rubella (MMR). You need at least 1 dose of MMR if you were born in 1957 or later. You may also need a second dose.  Meningococcal. If you are age 19 to 21 and a first-year college student living in a residence hall, or have one of several medical conditions, you need to get vaccinated against meningococcal disease. You may also need additional booster doses.  Zoster (shingles). If you are age 60 or older, you should get this vaccine.  Varicella (chickenpox). If you have never had chickenpox or you were vaccinated but received only 1 dose, talk to your caregiver to find out if you need this vaccine.  Hepatitis A. You need this vaccine if you have a specific risk factor for hepatitis A virus infection or you simply wish to be protected from this disease. The vaccine is usually given as 2 doses, 6 to 18 months apart.  Hepatitis B. You need this vaccine if you have a specific risk factor for hepatitis B virus infection or you simply wish to be protected from this disease. The vaccine is given in 3 doses, usually over 6 months. Preventive Services / Frequency Ages 19 to 39  Blood  pressure check.** / Every 1 to 2 years.  Lipid and cholesterol check.** / Every 5 years beginning at age 20.  Clinical breast exam.** / Every 3 years for women in their 20s and 30s.  Pap test.** / Every 2 years from ages 21 through 29. Every 3 years starting at age 30 through age 65 or 70 with a history of 3 consecutive normal Pap tests.  HPV screening.** / Every 3 years from ages 30 through ages 65 to 70 with a history of 3 consecutive normal Pap tests.  Hepatitis C blood test.** / For any individual with known risks for hepatitis C.  Skin self-exam. / Monthly.  Influenza immunization.** / Every year.  Pneumococcal polysaccharide immunization.** / 1 to 2 doses if you smoke cigarettes or if you have certain chronic medical conditions.  Tetanus, diphtheria, pertussis (Tdap, Td) immunization. / A one-time dose of Tdap vaccine. After that, you need a Td booster dose every 10 years.  HPV immunization. / 3 doses over 6 months, if you are 26 and younger.  Measles, mumps, rubella (MMR) immunization. / You need at least 1 dose of MMR if you were born in 1957 or later. You may also need a second dose.  Meningococcal immunization. / 1 dose if you are age 19 to 21 and a first-year college student living in a residence hall, or have one of several medical conditions, you need to get vaccinated against meningococcal disease. You may also need additional booster doses.  Varicella immunization.** / Consult your caregiver.  Hepatitis A immunization.** / Consult your caregiver. 2 doses, 6 to 18 months apart.  Hepatitis B immunization.** / Consult your caregiver. 3 doses usually over 6 months. Ages 40 to 64  Blood pressure check.** / Every 1 to 2 years.  Lipid and cholesterol check.** / Every 5 years beginning at age 20.  Clinical breast exam.** / Every year after age 40.  Mammogram.** / Every year beginning at age 40   and continuing for as long as you are in good health. Consult with your  caregiver.  Pap test.** / Every 3 years starting at age 30 through age 65 or 70 with a history of 3 consecutive normal Pap tests.  HPV screening.** / Every 3 years from ages 30 through ages 65 to 70 with a history of 3 consecutive normal Pap tests.  Fecal occult blood test (FOBT) of stool. / Every year beginning at age 50 and continuing until age 75. You may not need to do this test if you get a colonoscopy every 10 years.  Flexible sigmoidoscopy or colonoscopy.** / Every 5 years for a flexible sigmoidoscopy or every 10 years for a colonoscopy beginning at age 50 and continuing until age 75.  Hepatitis C blood test.** / For all people born from 1945 through 1965 and any individual with known risks for hepatitis C.  Skin self-exam. / Monthly.  Influenza immunization.** / Every year.  Pneumococcal polysaccharide immunization.** / 1 to 2 doses if you smoke cigarettes or if you have certain chronic medical conditions.  Tetanus, diphtheria, pertussis (Tdap, Td) immunization.** / A one-time dose of Tdap vaccine. After that, you need a Td booster dose every 10 years.  Measles, mumps, rubella (MMR) immunization. / You need at least 1 dose of MMR if you were born in 1957 or later. You may also need a second dose.  Varicella immunization.** / Consult your caregiver.  Meningococcal immunization.** / Consult your caregiver.  Hepatitis A immunization.** / Consult your caregiver. 2 doses, 6 to 18 months apart.  Hepatitis B immunization.** / Consult your caregiver. 3 doses, usually over 6 months. Ages 65 and over  Blood pressure check.** / Every 1 to 2 years.  Lipid and cholesterol check.** / Every 5 years beginning at age 20.  Clinical breast exam.** / Every year after age 40.  Mammogram.** / Every year beginning at age 40 and continuing for as long as you are in good health. Consult with your caregiver.  Pap test.** / Every 3 years starting at age 30 through age 65 or 70 with a 3  consecutive normal Pap tests. Testing can be stopped between 65 and 70 with 3 consecutive normal Pap tests and no abnormal Pap or HPV tests in the past 10 years.  HPV screening.** / Every 3 years from ages 30 through ages 65 or 70 with a history of 3 consecutive normal Pap tests. Testing can be stopped between 65 and 70 with 3 consecutive normal Pap tests and no abnormal Pap or HPV tests in the past 10 years.  Fecal occult blood test (FOBT) of stool. / Every year beginning at age 50 and continuing until age 75. You may not need to do this test if you get a colonoscopy every 10 years.  Flexible sigmoidoscopy or colonoscopy.** / Every 5 years for a flexible sigmoidoscopy or every 10 years for a colonoscopy beginning at age 50 and continuing until age 75.  Hepatitis C blood test.** / For all people born from 1945 through 1965 and any individual with known risks for hepatitis C.  Osteoporosis screening.** / A one-time screening for women ages 65 and over and women at risk for fractures or osteoporosis.  Skin self-exam. / Monthly.  Influenza immunization.** / Every year.  Pneumococcal polysaccharide immunization.** / 1 dose at age 65 (or older) if you have never been vaccinated.  Tetanus, diphtheria, pertussis (Tdap, Td) immunization. / A one-time dose of Tdap vaccine if you are over   65 and have contact with an infant, are a healthcare worker, or simply want to be protected from whooping cough. After that, you need a Td booster dose every 10 years.  Varicella immunization.** / Consult your caregiver.  Meningococcal immunization.** / Consult your caregiver.  Hepatitis A immunization.** / Consult your caregiver. 2 doses, 6 to 18 months apart.  Hepatitis B immunization.** / Check with your caregiver. 3 doses, usually over 6 months. ** Family history and personal history of risk and conditions may change your caregiver's recommendations. Document Released: 07/16/2001 Document Revised: 08/12/2011  Document Reviewed: 10/15/2010 [employer] Patient Information 2014 [employer].  

## 2012-11-18 ENCOUNTER — Ambulatory Visit: Payer: Medicare Other | Admitting: *Deleted

## 2012-11-18 DIAGNOSIS — E538 Deficiency of other specified B group vitamins: Secondary | ICD-10-CM

## 2012-11-18 LAB — HEPATIC FUNCTION PANEL
Albumin: 3.9 g/dL (ref 3.5–5.2)
Total Protein: 6.7 g/dL (ref 6.0–8.3)

## 2012-11-18 LAB — BASIC METABOLIC PANEL
CO2: 27 mEq/L (ref 19–32)
Chloride: 105 mEq/L (ref 96–112)
Creatinine, Ser: 0.7 mg/dL (ref 0.4–1.2)
Sodium: 138 mEq/L (ref 135–145)

## 2012-11-18 MED ORDER — CYANOCOBALAMIN 1000 MCG/ML IJ SOLN
1000.0000 ug | Freq: Once | INTRAMUSCULAR | Status: AC
  Administered 2012-11-18: 1000 ug via INTRAMUSCULAR

## 2012-11-19 ENCOUNTER — Ambulatory Visit (INDEPENDENT_AMBULATORY_CARE_PROVIDER_SITE_OTHER): Payer: Medicare Other | Admitting: *Deleted

## 2012-11-19 ENCOUNTER — Ambulatory Visit (HOSPITAL_BASED_OUTPATIENT_CLINIC_OR_DEPARTMENT_OTHER): Payer: Medicare Other | Attending: Pulmonary Disease | Admitting: Radiology

## 2012-11-19 VITALS — Ht 62.0 in | Wt 206.0 lb

## 2012-11-19 DIAGNOSIS — E538 Deficiency of other specified B group vitamins: Secondary | ICD-10-CM | POA: Diagnosis not present

## 2012-11-19 DIAGNOSIS — G4710 Hypersomnia, unspecified: Secondary | ICD-10-CM | POA: Insufficient documentation

## 2012-11-19 DIAGNOSIS — G478 Other sleep disorders: Secondary | ICD-10-CM

## 2012-11-19 DIAGNOSIS — G4730 Sleep apnea, unspecified: Secondary | ICD-10-CM | POA: Insufficient documentation

## 2012-11-19 MED ORDER — CYANOCOBALAMIN 1000 MCG/ML IJ SOLN
1000.0000 ug | Freq: Once | INTRAMUSCULAR | Status: AC
  Administered 2012-11-19: 1000 ug via INTRAMUSCULAR

## 2012-11-20 ENCOUNTER — Ambulatory Visit (INDEPENDENT_AMBULATORY_CARE_PROVIDER_SITE_OTHER): Payer: Medicare Other | Admitting: *Deleted

## 2012-11-20 DIAGNOSIS — E538 Deficiency of other specified B group vitamins: Secondary | ICD-10-CM

## 2012-11-20 MED ORDER — CYANOCOBALAMIN 1000 MCG/ML IJ SOLN
1000.0000 ug | Freq: Once | INTRAMUSCULAR | Status: AC
  Administered 2012-11-20: 1000 ug via INTRAMUSCULAR

## 2012-11-21 DIAGNOSIS — Q851 Tuberous sclerosis: Secondary | ICD-10-CM | POA: Diagnosis not present

## 2012-11-21 DIAGNOSIS — R413 Other amnesia: Secondary | ICD-10-CM | POA: Diagnosis not present

## 2012-11-21 DIAGNOSIS — R51 Headache: Secondary | ICD-10-CM | POA: Diagnosis not present

## 2012-11-23 ENCOUNTER — Ambulatory Visit (INDEPENDENT_AMBULATORY_CARE_PROVIDER_SITE_OTHER): Payer: Medicare Other | Admitting: *Deleted

## 2012-11-23 ENCOUNTER — Telehealth: Payer: Self-pay | Admitting: *Deleted

## 2012-11-23 DIAGNOSIS — E538 Deficiency of other specified B group vitamins: Secondary | ICD-10-CM | POA: Diagnosis not present

## 2012-11-23 DIAGNOSIS — S92309A Fracture of unspecified metatarsal bone(s), unspecified foot, initial encounter for closed fracture: Secondary | ICD-10-CM | POA: Diagnosis not present

## 2012-11-23 DIAGNOSIS — K589 Irritable bowel syndrome without diarrhea: Secondary | ICD-10-CM

## 2012-11-23 MED ORDER — CYANOCOBALAMIN 1000 MCG/ML IJ SOLN
1000.0000 ug | Freq: Once | INTRAMUSCULAR | Status: AC
  Administered 2012-11-23: 1000 ug via INTRAMUSCULAR

## 2012-11-23 MED ORDER — DICYCLOMINE HCL 20 MG PO TABS: 20.0000 mg | ORAL_TABLET | Freq: Four times a day (QID) | ORAL | Status: DC | PRN

## 2012-11-23 NOTE — Telephone Encounter (Signed)
Rx for dicyclomine sent to [facility] drug

## 2012-11-23 NOTE — Telephone Encounter (Signed)
Pt in office for B-12 injection, states she has been having a lot of problems with her IBS. Patient inquiring if something can be called in for her. SHe is currently taking a probiotic without relief. Can you call something in or does she need an appt?

## 2012-11-23 NOTE — Telephone Encounter (Signed)
This medication is not covered by her plan will cost $89.00 out of pocket, can it be changed to dicyclomine? GI referral entered.

## 2012-11-23 NOTE — Telephone Encounter (Signed)
Yes ---dicyclomine 20 mg 1 po qid prn #40

## 2012-11-23 NOTE — Telephone Encounter (Signed)
levsin sl 0.125 mg 1 po q4h prn abd pain / cramping---- and refer to GI

## 2012-11-25 ENCOUNTER — Telehealth: Payer: Self-pay | Admitting: Family Medicine

## 2012-11-25 DIAGNOSIS — G4700 Insomnia, unspecified: Secondary | ICD-10-CM

## 2012-11-25 MED ORDER — TRAZODONE HCL 50 MG PO TABS: 100.0000 mg | ORAL_TABLET | Freq: Every day | ORAL | Status: DC

## 2012-11-25 NOTE — Telephone Encounter (Signed)
Patient is calling requesting to speak with [name] about her sleep medication. Did not want to give any further information.

## 2012-11-25 NOTE — Telephone Encounter (Signed)
Refill for trazadone sent to [facility] drug, per providers written instructions from visit on 11/17/12. Pts pharmacy did not receive other earlier this month. Spoke with pt she is aware.

## 2012-11-26 ENCOUNTER — Telehealth: Payer: Self-pay | Admitting: Family Medicine

## 2012-11-26 NOTE — Telephone Encounter (Signed)
In reference to Gastroenterology referral, I called patient to inform her of her appointment for IBS, patient stated her GI is Dr. Loreta Ave, and that Dr. Loreta Ave is aware of all her issues going on at the moment.  Patient states she doesn't need GI at this time.  Appointment cancelled.

## 2012-11-27 DIAGNOSIS — G4710 Hypersomnia, unspecified: Secondary | ICD-10-CM | POA: Diagnosis not present

## 2012-11-27 DIAGNOSIS — G4730 Sleep apnea, unspecified: Secondary | ICD-10-CM

## 2012-11-27 NOTE — Procedures (Signed)
NAME:  [name], [name] NO.:  [id number]  MEDICAL RECORD NO.:  [id number]          PATIENT TYPE:  OUT  LOCATION:  SLEEP CENTER                 FACILITY[facility]  [facility]  PHYSICIAN:  Barbaraann Share, MD,FCCPDATE OF BIRTH:  June [birth year] 1947  DATE OF STUDY:  11/19/2012                           NOCTURNAL POLYSOMNOGRAM  REFERRING PHYSICIAN:  Barbaraann Share, MD,FCCP  INDICATION FOR THE STUDY:  Hypersomnia with sleep apnea.  EPWORTH SLEEPINESS SCORE:  3.  SLEEP ARCHITECTURE:  The patient had a total sleep time of 337 minutes with no slow-wave sleep and only 47 minutes of REM.  Sleep onset latency was normal at 22 minutes and REM onset was prolonged at 183 minutes. Sleep efficiency was mildly reduced at 87%.  RESPIRATORY DATA:  The patient was found to have 7 apneas and 3 obstructive hypopneas, giving her an apnea-hypopnea index of only 2 events per hour.  The events occurred in all body positions and there was mild snoring throughout.  The events were more common during REM.  OXYGEN DATA:  There was O2 desaturation as low as 91% with the patient's obstructive events.  CARDIAC DATA:  No clinically significant arrhythmias were noted.  MOVEMENTS/PARASOMNIA:  The patient had no significant leg jerks or other abnormal behavior seen.  IMPRESSIONS/RECOMMENDATIONS: 1. Small numbers of obstructive events, which do not meet the AHI     criteria for the obstructive sleep apnea syndrome.  The patient     should be encouraged to work aggressively on weight loss. 2. There were no other findings to suggest a sleep disorder causing     her complaint of fragmented and non-restorative sleep.  Again,     clinical correlation is suggested.     Barbaraann Share, MD,FCCP Diplomate, American Board of Sleep Medicine    [facility]/MEDQ  D:  11/27/2012 08:47:30  T:  11/27/2012 22:33:45  Job:  [id number]

## 2012-11-30 ENCOUNTER — Telehealth: Payer: Self-pay | Admitting: Pulmonary Disease

## 2012-11-30 ENCOUNTER — Ambulatory Visit (INDEPENDENT_AMBULATORY_CARE_PROVIDER_SITE_OTHER): Payer: Medicare Other

## 2012-11-30 DIAGNOSIS — E538 Deficiency of other specified B group vitamins: Secondary | ICD-10-CM | POA: Diagnosis not present

## 2012-11-30 MED ORDER — CYANOCOBALAMIN 1000 MCG/ML IJ SOLN
1000.0000 ug | Freq: Once | INTRAMUSCULAR | Status: AC
  Administered 2012-11-30: 1000 ug via INTRAMUSCULAR

## 2012-11-30 NOTE — Telephone Encounter (Signed)
Called and reviewed the pt's sleep study with her.  Does not have osa, leg jerks, arrhythmias, or other neurologic issue during the night. She does have chronic insomnia, and I would recommend CBT with a behavioral specialist.   Will send a note to her primary md.

## 2012-12-01 NOTE — Telephone Encounter (Signed)
Dr clance recommends a behvioral therapist for help with sleep----  Is pt agreeable to this?  If yes we can give her Dr Levie Heritage number at [facility]---  We can also send her list if she wants to choose someone different.

## 2012-12-02 NOTE — Telephone Encounter (Signed)
msg left to call the office     KP 

## 2012-12-07 ENCOUNTER — Ambulatory Visit (INDEPENDENT_AMBULATORY_CARE_PROVIDER_SITE_OTHER): Payer: Medicare Other | Admitting: *Deleted

## 2012-12-07 DIAGNOSIS — E538 Deficiency of other specified B group vitamins: Secondary | ICD-10-CM | POA: Diagnosis not present

## 2012-12-07 MED ORDER — CYANOCOBALAMIN 1000 MCG/ML IJ SOLN
1000.0000 ug | Freq: Once | INTRAMUSCULAR | Status: AC
  Administered 2012-12-07: 1000 ug via INTRAMUSCULAR

## 2012-12-07 NOTE — Telephone Encounter (Signed)
Patient in the office for B-12 injection and was inquiring to call placed to her home by Selena Batten. I advised pt that we were attempting to contact her in regards to her recent sleep study. I advised that an evaluation by Behavioral Med was recommended, pt is agreeable to this, contact information provided.   Patient also asked me to look at area on lower neck that appeared this morning. The area was slightly swollen (approx quarter sized), no redness, no tenderness no palpable mass. Does not interfere with swallowing or breathing. Advsied to placed warm compress on area and to call for appt if area becomes larger, is tender and interferes with swallowing.

## 2012-12-14 ENCOUNTER — Ambulatory Visit (INDEPENDENT_AMBULATORY_CARE_PROVIDER_SITE_OTHER): Payer: Medicare Other | Admitting: *Deleted

## 2012-12-14 DIAGNOSIS — E538 Deficiency of other specified B group vitamins: Secondary | ICD-10-CM | POA: Diagnosis not present

## 2012-12-14 MED ORDER — CYANOCOBALAMIN 1000 MCG/ML IJ SOLN
1000.0000 ug | Freq: Once | INTRAMUSCULAR | Status: AC
  Administered 2012-12-14: 1000 ug via INTRAMUSCULAR

## 2012-12-21 ENCOUNTER — Ambulatory Visit (INDEPENDENT_AMBULATORY_CARE_PROVIDER_SITE_OTHER): Payer: Medicare Other | Admitting: *Deleted

## 2012-12-21 DIAGNOSIS — E538 Deficiency of other specified B group vitamins: Secondary | ICD-10-CM

## 2012-12-21 MED ORDER — CYANOCOBALAMIN 1000 MCG/ML IJ SOLN
1000.0000 ug | Freq: Once | INTRAMUSCULAR | Status: AC
  Administered 2012-12-21: 1000 ug via INTRAMUSCULAR

## 2012-12-22 ENCOUNTER — Encounter: Payer: Self-pay | Admitting: Family Medicine

## 2012-12-22 ENCOUNTER — Ambulatory Visit (INDEPENDENT_AMBULATORY_CARE_PROVIDER_SITE_OTHER): Payer: Medicare Other | Admitting: Family Medicine

## 2012-12-22 VITALS — BP 118/64 | HR 63 | Temp 99.0°F | Wt 206.6 lb

## 2012-12-22 DIAGNOSIS — K589 Irritable bowel syndrome without diarrhea: Secondary | ICD-10-CM | POA: Diagnosis not present

## 2012-12-22 DIAGNOSIS — Q851 Tuberous sclerosis: Secondary | ICD-10-CM

## 2012-12-22 MED ORDER — DICYCLOMINE HCL 20 MG PO TABS: ORAL_TABLET | ORAL | Status: DC

## 2012-12-22 MED ORDER — HYDROCODONE-ACETAMINOPHEN 5-325 MG PO TABS: 1.0000 | ORAL_TABLET | Freq: Four times a day (QID) | ORAL | Status: DC | PRN

## 2012-12-22 NOTE — Progress Notes (Signed)
  Subjective:    Patient ID: [name], female    DOB: 24-Sep-1945, 67 y.o.   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  Pt saw neurosurgeon and she has lesions on brain but there is nothing to be done.   He told her she needed pain meds.  Pt prefers not to have any pain meds but she can not sleep because of the pain.  She is also have trouble with IBS but is waiting for a call from Dr Kenna Gilbert office.    Review of Systems As above    Objective:   Physical Exam BP 118/64  Pulse 63  Temp(Src) 99 F (37.2 C) (Oral)  Wt 206 lb 9.6 oz (93.713 kg)  BMI 37.78 kg/m2  SpO2 98% General appearance: alert, cooperative, appears stated age and no distress Neck: no adenopathy, supple, symmetrical, trachea midline and thyroid not enlarged, symmetric, no tenderness/mass/nodules Lungs: clear to auscultation bilaterally Heart: S1, S2 normal Abdomen: soft, non-tender; bowel sounds normal; no masses,  no organomegaly Extremities: extremities normal, atraumatic, no cyanosis or edema Skin: + lumps under skin on arms and legs, NT       Assessment & Plan:

## 2012-12-22 NOTE — Patient Instructions (Signed)
Tuberous Sclerosis Tuberous sclerosis is a rare genetic, neurological disorder. It is a multi-system disease. It can affect the brain, kidneys, heart, eyes, lungs, and other organs. Small benign tumors may grow on the face and eyes, as well as in the brain, kidneys, and other organs.  SYMPTOMS  Problems include:  Seizures. These most often begin in the first year of life.  Mental retardation.  Skin and eye lesions.  In some cases, neurobehavioral problems may also occur. Individuals with this disease may experience none or all of the symptoms with varying degrees of severity. DIAGNOSIS  Neuroimaging studies may be able to confirm the diagnosis. TREATMENT  There is no specific treatment for this disease. Treatment is symptomatic. It may include:  Anticonvulsant therapy for seizures.  Dermabrasion and laser removal techniques for the skin problems.  Drug therapy for neurobehavioral problems.  Treatment of high blood pressure caused by the kidney problems.  Surgery to remove growing tumors. PROGNOSIS  The prognosis varies depending on the severity of symptoms. There is no cure. Document Released: 05/10/2002 Document Revised: 08/12/2011 Document Reviewed: 05/20/2005 [facility] Patient Information 2014 [place], [place].

## 2012-12-22 NOTE — Assessment & Plan Note (Signed)
Per neurosugeon Pain meds per meds and orders Consider surgeon for lumps

## 2012-12-22 NOTE — Assessment & Plan Note (Signed)
Pending GI  Increase bentyl 40 mg

## 2012-12-28 ENCOUNTER — Ambulatory Visit: Payer: Medicare Other

## 2013-01-01 ENCOUNTER — Ambulatory Visit: Payer: Medicare Other | Admitting: Gastroenterology

## 2013-01-04 ENCOUNTER — Ambulatory Visit (INDEPENDENT_AMBULATORY_CARE_PROVIDER_SITE_OTHER): Payer: Medicare Other

## 2013-01-04 ENCOUNTER — Telehealth: Payer: Self-pay

## 2013-01-04 DIAGNOSIS — Q851 Tuberous sclerosis: Secondary | ICD-10-CM

## 2013-01-04 DIAGNOSIS — E538 Deficiency of other specified B group vitamins: Secondary | ICD-10-CM | POA: Diagnosis not present

## 2013-01-04 MED ORDER — CYANOCOBALAMIN 1000 MCG/ML IJ SOLN
1000.0000 ug | Freq: Once | INTRAMUSCULAR | Status: AC
  Administered 2013-01-04: 1000 ug via INTRAMUSCULAR

## 2013-01-04 NOTE — Telephone Encounter (Signed)
Patient would like a refill on Hydrocodone 5/325. Sees Dr Laury Axon. Last refill 12/22/12 #30 Last OV 12/22/12 Takes 2 nightly for Tuberous Sclerosis. Advise please.

## 2013-01-04 NOTE — Telephone Encounter (Signed)
#  30 as of 8/5

## 2013-01-05 MED ORDER — HYDROCODONE-ACETAMINOPHEN 5-325 MG PO TABS: 1.0000 | ORAL_TABLET | Freq: Four times a day (QID) | ORAL | Status: DC | PRN

## 2013-01-05 NOTE — Telephone Encounter (Signed)
Rx written, will be signed and faxed today.  Advised patient to check with pharmacy later today.

## 2013-01-08 ENCOUNTER — Encounter: Payer: Self-pay | Admitting: Family Medicine

## 2013-01-11 ENCOUNTER — Ambulatory Visit (INDEPENDENT_AMBULATORY_CARE_PROVIDER_SITE_OTHER): Payer: Medicare Other

## 2013-01-11 DIAGNOSIS — E538 Deficiency of other specified B group vitamins: Secondary | ICD-10-CM | POA: Diagnosis not present

## 2013-01-11 MED ORDER — CYANOCOBALAMIN 1000 MCG/ML IJ SOLN
1000.0000 ug | Freq: Once | INTRAMUSCULAR | Status: AC
  Administered 2013-01-11: 1000 ug via INTRAMUSCULAR

## 2013-01-12 ENCOUNTER — Encounter: Payer: Self-pay | Admitting: Gynecology

## 2013-01-12 ENCOUNTER — Ambulatory Visit (INDEPENDENT_AMBULATORY_CARE_PROVIDER_SITE_OTHER): Payer: Medicare Other | Admitting: Gynecology

## 2013-01-12 VITALS — BP 124/82

## 2013-01-12 DIAGNOSIS — Z8639 Personal history of other endocrine, nutritional and metabolic disease: Secondary | ICD-10-CM

## 2013-01-12 DIAGNOSIS — N9089 Other specified noninflammatory disorders of vulva and perineum: Secondary | ICD-10-CM | POA: Diagnosis not present

## 2013-01-12 DIAGNOSIS — M899 Disorder of bone, unspecified: Secondary | ICD-10-CM

## 2013-01-12 DIAGNOSIS — R197 Diarrhea, unspecified: Secondary | ICD-10-CM | POA: Diagnosis not present

## 2013-01-12 DIAGNOSIS — N952 Postmenopausal atrophic vaginitis: Secondary | ICD-10-CM

## 2013-01-12 DIAGNOSIS — R152 Fecal urgency: Secondary | ICD-10-CM | POA: Diagnosis not present

## 2013-01-12 DIAGNOSIS — R32 Unspecified urinary incontinence: Secondary | ICD-10-CM | POA: Diagnosis not present

## 2013-01-12 DIAGNOSIS — K219 Gastro-esophageal reflux disease without esophagitis: Secondary | ICD-10-CM | POA: Diagnosis not present

## 2013-01-12 DIAGNOSIS — Z86010 Personal history of colonic polyps: Secondary | ICD-10-CM | POA: Diagnosis not present

## 2013-01-12 DIAGNOSIS — E559 Vitamin D deficiency, unspecified: Secondary | ICD-10-CM | POA: Diagnosis not present

## 2013-01-12 DIAGNOSIS — M8580 Other specified disorders of bone density and structure, unspecified site: Secondary | ICD-10-CM

## 2013-01-12 MED ORDER — ALENDRONATE SODIUM 70 MG PO TABS: 70.0000 mg | ORAL_TABLET | ORAL | Status: DC

## 2013-01-12 MED ORDER — NONFORMULARY OR COMPOUNDED ITEM: Status: DC

## 2013-01-12 NOTE — Patient Instructions (Addendum)
Osteoporosis Throughout your life, your body breaks down old bone and replaces it with new bone. As you get older, your body does not replace bone as quickly as it breaks it down. By the age of 30 years, most people begin to gradually lose bone because of the imbalance between bone loss and replacement. Some people lose more bone than others. Bone loss beyond a specified normal degree is considered osteoporosis.  Osteoporosis affects the strength and durability of your bones. The inside of the ends of your bones and your flat bones, like the bones of your pelvis, look like honeycomb, filled with tiny open spaces. As bone loss occurs, your bones become less dense. This means that the open spaces inside your bones become bigger and the walls between these spaces become thinner. This makes your bones weaker. Bones of a person with osteoporosis can become so weak that they can break (fracture) during minor accidents, such as a simple fall. CAUSES  The following factors have been associated with the development of osteoporosis:  Smoking.  Drinking more than 2 alcoholic drinks several days per week.  Long-term use of certain medicines:  Corticosteroids.  Chemotherapy medicines.  Thyroid medicines.  Antiepileptic medicines.  Gonadal hormone suppression medicine.  Immunosuppression medicine.  Being underweight.  Lack of physical activity.  Lack of exposure to the sun. This can lead to vitamin D deficiency.  Certain medical conditions:  Certain inflammatory bowel diseases, such as Crohn's disease and ulcerative colitis.  Diabetes.  Hyperthyroidism.  Hyperparathyroidism. RISK FACTORS Anyone can develop osteoporosis. However, the following factors can increase your risk of developing osteoporosis:  Gender Women are at higher risk than men.  Age Being older than 50 years increases your risk.  Ethnicity White and Asian people have an increased risk.  Weight Being extremely  underweight can increase your risk of osteoporosis.  Family history of osteoporosis Having a family member who has developed osteoporosis can increase your risk. SYMPTOMS  Usually, people with osteoporosis have no symptoms.  DIAGNOSIS  Signs during a physical exam that may prompt your caregiver to suspect osteoporosis include:  Decreased height. This is usually caused by the compression of the bones that form your spine (vertebrae) because they have weakened and become fractured.  A curving or rounding of the upper back (kyphosis). To confirm signs of osteoporosis, your caregiver may request a procedure that uses 2 low-dose X-ray beams with different levels of energy to measure your bone mineral density (dual-energy X-ray absorptiometry [DXA]). Also, your caregiver may check your level of vitamin D. TREATMENT  The goal of osteoporosis treatment is to strengthen bones in order to decrease the risk of bone fractures. There are different types of medicines available to help achieve this goal. Some of these medicines work by slowing the processes of bone loss. Some medicines work by increasing bone density. Treatment also involves making sure that your levels of calcium and vitamin D are adequate. PREVENTION  There are things you can do to help prevent osteoporosis. Adequate intake of calcium and vitamin D can help you achieve optimal bone mineral density. Regular exercise can also help, especially resistance and weight-bearing activities. If you smoke, quitting smoking is an important part of osteoporosis prevention. MAKE SURE YOU:  Understand these instructions.  Will watch your condition.  Will get help right away if you are not doing well or get worse. Document Released: 02/27/2005 Document Revised: 05/06/2012 Document Reviewed: 05/04/2011 [facility] Patient Information 2014 [place], [place]. Alendronate tablets What is this medicine?  ALENDRONATE (a LEN droe nate) slows calcium loss from  bones. It helps to make normal healthy bone and to slow bone loss in people with Paget's disease and osteoporosis. It may be used in others at risk for bone loss. This medicine may be used for other purposes; ask your health care provider or pharmacist if you have questions. What should I tell my health care provider before I take this medicine? They need to know if you have any of these conditions: -dental disease -esophagus, stomach, or intestine problems, like acid reflux or GERD -kidney disease -low blood calcium -low vitamin D -problems sitting or standing 30 minutes -trouble swallowing -an unusual or allergic reaction to alendronate, other medicines, foods, dyes, or preservatives -pregnant or trying to get pregnant -breast-feeding How should I use this medicine? You must take this medicine exactly as directed or you will lower the amount of the medicine you absorb into your body or you may cause yourself harm. Take this medicine by mouth first thing in the morning, after you are up for the day. Do not eat or drink anything before you take your medicine. Swallow the tablet with a full glass (6 to 8 fluid ounces) of plain water. Do not take this medicine with any other drink. Do not chew or crush the tablet. After taking this medicine, do not eat breakfast, drink, or take any medicines or vitamins for at least 30 minutes. Sit or stand up for at least 30 minutes after you take this medicine; do not lie down. Do not take your medicine more often than directed. Talk to your pediatrician regarding the use of this medicine in children. Special care may be needed. Overdosage: If you think you have taken too much of this medicine contact a poison control center or emergency room at once. NOTE: This medicine is only for you. Do not share this medicine with others. What if I miss a dose? If you miss a dose, do not take it later in the day. Continue your normal schedule starting the next morning. Do  not take double or extra doses. What may interact with this medicine? -aluminum hydroxide -antacids -aspirin -calcium supplements -drugs for inflammation like ibuprofen, naproxen, and others -iron supplements -magnesium supplements -vitamins with minerals This list may not describe all possible interactions. Give your health care provider a list of all the medicines, herbs, non-prescription drugs, or dietary supplements you use. Also tell them if you smoke, drink alcohol, or use illegal drugs. Some items may interact with your medicine. What should I watch for while using this medicine? Visit your doctor or health care professional for regular checks ups. It may be some time before you see benefit from this medicine. Do not stop taking your medicine except on your doctor's advice. Your doctor or health care professional may order blood tests and other tests to see how you are doing. You should make sure you get enough calcium and vitamin D while you are taking this medicine, unless your doctor tells you not to. Discuss the foods you eat and the vitamins you take with your health care professional. Some people who take this medicine have severe bone, joint, and/or muscle pain. This medicine may also increase your risk for a broken thigh bone. Tell your doctor right away if you have pain in your upper leg or groin. Tell your doctor if you have any pain that does not go away or that gets worse. This medicine can make you more sensitive to the  sun. If you get a rash while taking this medicine, sunlight may cause the rash to get worse. Keep out of the sun. If you cannot avoid being in the sun, wear protective clothing and use sunscreen. Do not use sun lamps or tanning beds/booths. What side effects may I notice from receiving this medicine? Side effects that you should report to your doctor or health care professional as soon as possible: -allergic reactions like skin rash, itching or hives, swelling of  the face, lips, or tongue -black or tarry stools -bone, muscle or joint pain -changes in vision -chest pain -heartburn or stomach pain -jaw pain, especially after dental work -pain or trouble when swallowing -redness, blistering, peeling or loosening of the skin, including inside the mouth Side effects that usually do not require medical attention (report to your doctor or health care professional if they continue or are bothersome): -changes in taste -diarrhea or constipation -eye pain or itching -headache -nausea or vomiting -stomach gas or fullness This list may not describe all possible side effects. Call your doctor for medical advice about side effects. You may report side effects to FDA at 1-800-FDA-1088. Where should I keep my medicine? Keep out of the reach of children. Store at room temperature of 15 and 30 degrees C (59 and 86 degrees F). Throw away any unused medicine after the expiration date. NOTE: This sheet is a summary. It may not cover all possible information. If you have questions about this medicine, talk to your doctor, pharmacist, or health care provider.  2012, Elsevier/Gold Standard. (11/16/2010 8:56:09 AM)

## 2013-01-12 NOTE — Progress Notes (Signed)
Patient presented to the office today for 3 weeks followup.patient with history of tuberous sclerosis being followed by a neurologist at [facility]. Patient was seen in the office on 10/12/2012 for 6 week postop appointment as a result of her CO2 laser ablation of perirectal, left labia majora and minora and vaginal cuff and right groin hyperplastic lesions and condyloma acuminatum.  Pap smear February 2014: Satisfactory for evaluation. 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  preop pathology report as follows:  1. Vagina, biopsy, cuff  - INFLAMMATION AND SLIGHT SQUAMOUS ATYPIA.  - SEE MICROSCOPIC DESCRIPTION  2. Labium, biopsy, left, crease between majora/minora  - MILD SQUAMOUS HYPERPLASIA AND HYPERKERATOSIS.  - SEE MICROSCOPIC DESCRIPTION  3. Labium, biopsy, left medial  - MILD SQUAMOUS HYPERPLASIA AND HYPERKERATOSIS.  - SEE MICROSCOPIC DESCRIPTION  4. Labium, biopsy, left majora lateral  - MILD SQUAMOUS HYPERPLASIA AND HYPERKERATOSIS.  - SEE MICROSCOPIC DESCRIPTION  5. Skin , right groin  - SQUAMOUS HYPERPLASIA AND HYPERKERATOSIS.  She was started on estradiol vaginal cream to apply twice a week but did not start because she thought it was too expensive. She was asked to return to the office for followup today. Patient also has history of vitamin D deficiency and osteopenia as well as urinary incontinence.  Exam: Bartholin urethra Skene glands within normal limits all areas completely healed. Vagina with atrophic changes.  Review of bone density study done here in the office on 08/11/2012 her lowest T. Score was at the right femoral neck with a value of -2.3. Frax analysis demonstrated that her ten-year fracture risk exceeded the threshold. Her ten-year fracture risk of the hip is 3.3% and her overall osteoporotic risk was 18%. We discussed the recommendation that in addition to calcium and vitamin D that she needs to be placed on some form of  anti-resorptive agent. We discussed starting her on Fosamax 70 mg q. Weekly. The risks benefits and pros and cons of the medication were discussed to include potential risk of osteonecrosis of the jaw or spontaneous subtrochanteric fracture of the hip. Patient fully understands and accepts. She will be kept on for longer than 6 years. We'll repeat her bone density study one year after starting treatment. She is scheduled to return back to the office in February for full annual exam. We will be checking her vitamin D level today. She was given a prescription once again for vaginal estradiol to apply twice a week. We'll repeat her Pap smear when she returns next year for her annual exam.

## 2013-01-18 DIAGNOSIS — R152 Fecal urgency: Secondary | ICD-10-CM | POA: Diagnosis not present

## 2013-01-18 DIAGNOSIS — Z86010 Personal history of colonic polyps: Secondary | ICD-10-CM | POA: Diagnosis not present

## 2013-01-18 DIAGNOSIS — K219 Gastro-esophageal reflux disease without esophagitis: Secondary | ICD-10-CM | POA: Diagnosis not present

## 2013-01-18 DIAGNOSIS — R197 Diarrhea, unspecified: Secondary | ICD-10-CM | POA: Diagnosis not present

## 2013-01-21 ENCOUNTER — Telehealth: Payer: Self-pay | Admitting: Family Medicine

## 2013-01-21 DIAGNOSIS — K589 Irritable bowel syndrome without diarrhea: Secondary | ICD-10-CM

## 2013-01-21 NOTE — Telephone Encounter (Signed)
Was GI appointment scheduled?  I was only givint her that until GI appointment

## 2013-01-21 NOTE — Telephone Encounter (Signed)
Spoke with patient and she said the GI doctor  (Dr.Mann) completed a panel and will not fill the Rx until the results come back. Please advise      KP

## 2013-01-21 NOTE — Telephone Encounter (Signed)
Patient called in requesting a refill for the bentyl wanted to know if this can be refilled or was it for only one time?  Please Advise     Ag cma

## 2013-01-21 NOTE — Telephone Encounter (Signed)
Give #[phone]

## 2013-01-21 NOTE — Telephone Encounter (Signed)
Patient called requesting new rx for dicyclomine. She uses [facility].

## 2013-01-22 MED ORDER — DICYCLOMINE HCL 20 MG PO TABS: ORAL_TABLET | ORAL | Status: DC

## 2013-01-25 DIAGNOSIS — S92309A Fracture of unspecified metatarsal bone(s), unspecified foot, initial encounter for closed fracture: Secondary | ICD-10-CM | POA: Diagnosis not present

## 2013-01-25 DIAGNOSIS — M7750 Other enthesopathy of unspecified foot: Secondary | ICD-10-CM | POA: Diagnosis not present

## 2013-02-02 ENCOUNTER — Other Ambulatory Visit: Payer: Self-pay | Admitting: Orthopaedic Surgery

## 2013-02-02 ENCOUNTER — Ambulatory Visit
Admission: RE | Admit: 2013-02-02 | Discharge: 2013-02-02 | Disposition: A | Discharge status: Home / Self Care | Payer: Medicare Other | Source: Ambulatory Visit | Attending: Orthopaedic Surgery | Admitting: Orthopaedic Surgery

## 2013-02-02 DIAGNOSIS — S92309A Fracture of unspecified metatarsal bone(s), unspecified foot, initial encounter for closed fracture: Secondary | ICD-10-CM | POA: Diagnosis not present

## 2013-02-02 DIAGNOSIS — M79671 Pain in right foot: Secondary | ICD-10-CM

## 2013-02-08 ENCOUNTER — Telehealth: Payer: Self-pay | Admitting: Family Medicine

## 2013-02-08 ENCOUNTER — Encounter: Payer: Self-pay | Admitting: Family Medicine

## 2013-02-08 ENCOUNTER — Ambulatory Visit (INDEPENDENT_AMBULATORY_CARE_PROVIDER_SITE_OTHER): Payer: Medicare Other | Admitting: *Deleted

## 2013-02-08 DIAGNOSIS — E538 Deficiency of other specified B group vitamins: Secondary | ICD-10-CM

## 2013-02-08 DIAGNOSIS — S92309A Fracture of unspecified metatarsal bone(s), unspecified foot, initial encounter for closed fracture: Secondary | ICD-10-CM | POA: Diagnosis not present

## 2013-02-08 MED ORDER — CYANOCOBALAMIN 1000 MCG/ML IJ SOLN
1000.0000 ug | Freq: Once | INTRAMUSCULAR | Status: AC
  Administered 2013-02-08: 1000 ug via INTRAMUSCULAR

## 2013-02-08 MED ORDER — CYANOCOBALAMIN 1000 MCG/ML IJ SOLN: 1000.0000 ug | Freq: Once | INTRAMUSCULAR | Status: DC

## 2013-02-08 NOTE — Telephone Encounter (Signed)
Called pt and LMOVM to return call.  °

## 2013-02-08 NOTE — Telephone Encounter (Signed)
Pt f/u w/ Dois Davenport regarding RX sent on 9-8, Vit B Intramuscular.  Pt does not know how to give IM shots.  PLEASE F/U W/ PT FOR ANOTHER RX.

## 2013-02-09 NOTE — Telephone Encounter (Signed)
Spoke with pt and advised to bring the vial she had at home to visit next month. We will not charge for med, only injection in October

## 2013-02-10 ENCOUNTER — Other Ambulatory Visit: Payer: Self-pay | Admitting: Family Medicine

## 2013-02-10 ENCOUNTER — Telehealth: Payer: Self-pay | Admitting: Family Medicine

## 2013-02-10 DIAGNOSIS — Q851 Tuberous sclerosis: Secondary | ICD-10-CM

## 2013-02-10 NOTE — Telephone Encounter (Signed)
Patient is calling in reference to finding someone at [facility] to see her for her Tubular Sclerosis. States that she would like to speak with Selena Batten in regards to what they need faxed over to them.  Fax#: [phone] Attn: Toniann Fail

## 2013-02-10 NOTE — Telephone Encounter (Signed)
Is that the last name of the DR at [facility]?

## 2013-02-10 NOTE — Telephone Encounter (Signed)
Spoke with patient and she found a provider named Toniann Fail at [facility]. She would like for you to wirte something stating that she has the tubular sclerosis and they are multiplying and they are painful. Wants you to let them know you feel she needs a specialist. She said none of the Ortho doc's will see her around here.       KP

## 2013-02-10 NOTE — Telephone Encounter (Signed)
We can put referral in for her

## 2013-02-11 NOTE — Telephone Encounter (Addendum)
They will not see her without the letter per patient, she said the letter will advise them of the urgency and how bad she is getting and also get her to the correct specialist.    KP

## 2013-02-12 NOTE — Telephone Encounter (Signed)
Brittany is working on this.

## 2013-02-16 ENCOUNTER — Ambulatory Visit: Payer: Medicare Other

## 2013-02-16 NOTE — Telephone Encounter (Signed)
Please advise      KP 

## 2013-02-16 NOTE — Telephone Encounter (Signed)
Pt's records have been faxed to [facility]. They will review the notes and determine which provider should see her. They will contact her to schedule appt. Pt made aware of status.

## 2013-02-16 NOTE — Telephone Encounter (Signed)
Patient is calling to check the status of this. Please advise.

## 2013-02-22 DIAGNOSIS — S92309A Fracture of unspecified metatarsal bone(s), unspecified foot, initial encounter for closed fracture: Secondary | ICD-10-CM | POA: Diagnosis not present

## 2013-02-22 DIAGNOSIS — M79609 Pain in unspecified limb: Secondary | ICD-10-CM | POA: Diagnosis not present

## 2013-02-22 DIAGNOSIS — M1990 Unspecified osteoarthritis, unspecified site: Secondary | ICD-10-CM | POA: Diagnosis not present

## 2013-02-22 DIAGNOSIS — M25579 Pain in unspecified ankle and joints of unspecified foot: Secondary | ICD-10-CM | POA: Diagnosis not present

## 2013-02-22 DIAGNOSIS — IMO0002 Reserved for concepts with insufficient information to code with codable children: Secondary | ICD-10-CM | POA: Diagnosis not present

## 2013-02-22 DIAGNOSIS — S8990XA Unspecified injury of unspecified lower leg, initial encounter: Secondary | ICD-10-CM | POA: Diagnosis not present

## 2013-02-22 DIAGNOSIS — M19079 Primary osteoarthritis, unspecified ankle and foot: Secondary | ICD-10-CM | POA: Diagnosis not present

## 2013-03-01 ENCOUNTER — Other Ambulatory Visit: Payer: Self-pay | Admitting: *Deleted

## 2013-03-01 DIAGNOSIS — M1990 Unspecified osteoarthritis, unspecified site: Secondary | ICD-10-CM

## 2013-03-15 ENCOUNTER — Ambulatory Visit
Admission: RE | Admit: 2013-03-15 | Discharge: 2013-03-15 | Disposition: A | Discharge status: Home / Self Care | Payer: Medicare Other | Source: Ambulatory Visit | Attending: *Deleted | Admitting: *Deleted

## 2013-03-15 DIAGNOSIS — M19079 Primary osteoarthritis, unspecified ankle and foot: Secondary | ICD-10-CM | POA: Diagnosis not present

## 2013-03-15 DIAGNOSIS — S92309A Fracture of unspecified metatarsal bone(s), unspecified foot, initial encounter for closed fracture: Secondary | ICD-10-CM | POA: Diagnosis not present

## 2013-03-15 DIAGNOSIS — M76829 Posterior tibial tendinitis, unspecified leg: Secondary | ICD-10-CM | POA: Diagnosis not present

## 2013-03-15 DIAGNOSIS — M1990 Unspecified osteoarthritis, unspecified site: Secondary | ICD-10-CM

## 2013-03-16 ENCOUNTER — Ambulatory Visit (INDEPENDENT_AMBULATORY_CARE_PROVIDER_SITE_OTHER): Payer: Medicare Other | Admitting: *Deleted

## 2013-03-16 DIAGNOSIS — E538 Deficiency of other specified B group vitamins: Secondary | ICD-10-CM | POA: Diagnosis not present

## 2013-03-16 MED ORDER — CYANOCOBALAMIN 1000 MCG/ML IJ SOLN
1000.0000 ug | Freq: Once | INTRAMUSCULAR | Status: AC
  Administered 2013-03-16: 1000 ug via INTRAMUSCULAR

## 2013-04-05 ENCOUNTER — Ambulatory Visit (INDEPENDENT_AMBULATORY_CARE_PROVIDER_SITE_OTHER): Payer: Medicare Other | Admitting: *Deleted

## 2013-04-05 DIAGNOSIS — E538 Deficiency of other specified B group vitamins: Secondary | ICD-10-CM | POA: Diagnosis not present

## 2013-04-05 MED ORDER — CYANOCOBALAMIN 1000 MCG/ML IJ SOLN
1000.0000 ug | Freq: Once | INTRAMUSCULAR | Status: AC
  Administered 2013-04-05: 1000 ug via INTRAMUSCULAR

## 2013-04-13 ENCOUNTER — Ambulatory Visit: Payer: Medicare Other

## 2013-04-19 ENCOUNTER — Ambulatory Visit: Payer: Medicare Other | Admitting: Family Medicine

## 2013-04-26 ENCOUNTER — Ambulatory Visit: Payer: Medicare Other | Admitting: Gynecology

## 2013-05-03 ENCOUNTER — Ambulatory Visit (INDEPENDENT_AMBULATORY_CARE_PROVIDER_SITE_OTHER): Payer: Medicare Other | Admitting: *Deleted

## 2013-05-03 DIAGNOSIS — E538 Deficiency of other specified B group vitamins: Secondary | ICD-10-CM | POA: Diagnosis not present

## 2013-05-03 MED ORDER — CYANOCOBALAMIN 1000 MCG/ML IJ SOLN
1000.0000 ug | Freq: Once | INTRAMUSCULAR | Status: AC
  Administered 2013-05-03: 1000 ug via INTRAMUSCULAR

## 2013-05-18 DIAGNOSIS — E538 Deficiency of other specified B group vitamins: Secondary | ICD-10-CM | POA: Diagnosis not present

## 2013-05-18 DIAGNOSIS — M545 Low back pain, unspecified: Secondary | ICD-10-CM | POA: Diagnosis not present

## 2013-05-18 DIAGNOSIS — R413 Other amnesia: Secondary | ICD-10-CM | POA: Diagnosis not present

## 2013-05-18 DIAGNOSIS — M79609 Pain in unspecified limb: Secondary | ICD-10-CM | POA: Diagnosis not present

## 2013-05-18 DIAGNOSIS — Q851 Tuberous sclerosis: Secondary | ICD-10-CM | POA: Diagnosis not present

## 2013-06-01 DIAGNOSIS — M5126 Other intervertebral disc displacement, lumbar region: Secondary | ICD-10-CM | POA: Diagnosis not present

## 2013-06-01 DIAGNOSIS — M545 Low back pain, unspecified: Secondary | ICD-10-CM | POA: Diagnosis not present

## 2013-06-07 ENCOUNTER — Ambulatory Visit (INDEPENDENT_AMBULATORY_CARE_PROVIDER_SITE_OTHER): Payer: Medicare Other | Admitting: *Deleted

## 2013-06-07 DIAGNOSIS — E538 Deficiency of other specified B group vitamins: Secondary | ICD-10-CM

## 2013-06-07 MED ORDER — CYANOCOBALAMIN 1000 MCG/ML IJ SOLN
1000.0000 ug | Freq: Once | INTRAMUSCULAR | Status: AC
  Administered 2013-06-07: 1000 ug via INTRAMUSCULAR

## 2013-06-15 ENCOUNTER — Telehealth: Payer: Self-pay | Admitting: Family Medicine

## 2013-06-15 NOTE — Telephone Encounter (Signed)
Patient states that she has had a stomach bug for 1 week now but does not have a way to get into the office. She is requesting to discuss this with Maudie Mercury. Please advise.

## 2013-06-15 NOTE — Telephone Encounter (Signed)
Spoke with patient and she stated she had the stomach bug off and on for the last week with abdominal discomfort, gas, nausea, diarrhea, aches,and a dry cough. the dry cough has been ongoing for one year. The pain is on the left side of the abdominal and it is a 6/10, denied fever, denied blood in the stool, she ate an egg yesterday for lunch, no much of an appetite but she has been drinking fluids, she was unable to urinate yesterday but has been able to do so today. Evaluation scheduled for tomorrow.      KP

## 2013-06-16 ENCOUNTER — Encounter: Payer: Self-pay | Admitting: Family Medicine

## 2013-06-16 ENCOUNTER — Ambulatory Visit (INDEPENDENT_AMBULATORY_CARE_PROVIDER_SITE_OTHER): Payer: Medicare Other | Admitting: Family Medicine

## 2013-06-16 VITALS — BP 114/82 | HR 67 | Temp 98.7°F | Wt 206.0 lb

## 2013-06-16 DIAGNOSIS — R1032 Left lower quadrant pain: Secondary | ICD-10-CM | POA: Diagnosis not present

## 2013-06-16 DIAGNOSIS — E669 Obesity, unspecified: Secondary | ICD-10-CM | POA: Insufficient documentation

## 2013-06-16 DIAGNOSIS — R109 Unspecified abdominal pain: Secondary | ICD-10-CM

## 2013-06-16 DIAGNOSIS — R82998 Other abnormal findings in urine: Secondary | ICD-10-CM

## 2013-06-16 DIAGNOSIS — R197 Diarrhea, unspecified: Secondary | ICD-10-CM

## 2013-06-16 LAB — CBC WITH DIFFERENTIAL/PLATELET
BASOS PCT: 0.6 % (ref 0.0–3.0)
Basophils Absolute: 0 10*3/uL (ref 0.0–0.1)
EOS ABS: 0.2 10*3/uL (ref 0.0–0.7)
Eosinophils Relative: 2.3 % (ref 0.0–5.0)
HCT: 43.5 % (ref 36.0–46.0)
Hemoglobin: 14.7 g/dL (ref 12.0–15.0)
Lymphocytes Relative: 20.7 % (ref 12.0–46.0)
Lymphs Abs: 1.4 10*3/uL (ref 0.7–4.0)
MCHC: 33.9 g/dL (ref 30.0–36.0)
MCV: 89.8 fl (ref 78.0–100.0)
MONO ABS: 0.4 10*3/uL (ref 0.1–1.0)
Monocytes Relative: 5.5 % (ref 3.0–12.0)
NEUTROS PCT: 70.9 % (ref 43.0–77.0)
Neutro Abs: 4.7 10*3/uL (ref 1.4–7.7)
Platelets: 175 10*3/uL (ref 150.0–400.0)
RBC: 4.84 Mil/uL (ref 3.87–5.11)
RDW: 13.5 % (ref 11.5–14.6)
WBC: 6.7 10*3/uL (ref 4.5–10.5)

## 2013-06-16 LAB — BASIC METABOLIC PANEL
BUN: 16 mg/dL (ref 6–23)
CO2: 22 meq/L (ref 19–32)
CREATININE: 0.8 mg/dL (ref 0.4–1.2)
Calcium: 9 mg/dL (ref 8.4–10.5)
Chloride: 105 mEq/L (ref 96–112)
GFR: 82.2 mL/min (ref >60.00)
Glucose, Bld: 95 mg/dL (ref 70–99)
Potassium: 3.5 mEq/L (ref 3.5–5.1)
Sodium: 137 mEq/L (ref 135–145)

## 2013-06-16 LAB — HEPATIC FUNCTION PANEL
ALT: 17 U/L (ref 0–35)
AST: 17 U/L (ref 0–37)
Albumin: 4 g/dL (ref 3.5–5.2)
Alkaline Phosphatase: 95 U/L (ref 39–117)
Bilirubin, Direct: 0 mg/dL (ref 0.0–0.3)
TOTAL PROTEIN: 7.4 g/dL (ref 6.0–8.3)
Total Bilirubin: 0.5 mg/dL (ref 0.3–1.2)

## 2013-06-16 LAB — POCT URINALYSIS DIPSTICK
Bilirubin, UA: NEGATIVE
Glucose, UA: NEGATIVE
Ketones, UA: NEGATIVE
Nitrite, UA: NEGATIVE
PROTEIN UA: NEGATIVE
RBC UA: NEGATIVE
UROBILINOGEN UA: 0.2
pH, UA: 5

## 2013-06-16 LAB — AMYLASE: AMYLASE: 47 U/L (ref 27–131)

## 2013-06-16 LAB — LIPASE: LIPASE: 25 U/L (ref 11.0–59.0)

## 2013-06-16 LAB — TSH: TSH: 1.88 u[IU]/mL (ref 0.35–5.50)

## 2013-06-16 NOTE — Progress Notes (Signed)
Pre visit review using our clinic review tool, if applicable. No additional management support is needed unless otherwise documented below in the visit note. 

## 2013-06-16 NOTE — Progress Notes (Signed)
  Subjective:     [name] is a 68 y.o. female who presents for evaluation of abdominal pain. Onset was a few days ago. Symptoms have been gradually worsening. The pain is described as colicky, cramping and sharp, and is 7/10 in intensity. Pain is located in the LLQ without radiation.  Aggravating factors: movement.  Alleviating factors: none. Associated symptoms: anorexia. The patient denies anorexia, arthralagias, belching, chills, constipation, dysuria, fever, flatus, frequency, headache, hematochezia, hematuria, melena, myalgias, nausea, sweats and vomiting.  The patient's history has been marked as reviewed and updated as appropriate.  Review of Systems Pertinent items are noted in HPI.     Objective:    BP 114/82  Pulse 67  Temp(Src) 98.7 F (37.1 C) (Oral)  Wt 206 lb (93.441 kg)  SpO2 98% General appearance: alert, cooperative, appears stated age and no distress Abdomen: abnormal findings:  moderate tenderness in the LLQ    Assessment:    Abdominal pain,unknown cause .    Plan:    The diagnosis was discussed with the patient and evaluation and treatment plans outlined. See orders for lab and imaging studies. Further follow-up plans will be based on outcome of lab/imaging studies; see orders.

## 2013-06-16 NOTE — Patient Instructions (Signed)
Abdominal Pain, Adult °Many things can cause abdominal pain. Usually, abdominal pain is not caused by a disease and will improve without treatment. It can often be observed and treated at home. Your health care provider will do a physical exam and possibly order blood tests and X-rays to help determine the seriousness of your pain. However, in many cases, more time must pass before a clear cause of the pain can be found. Before that point, your health care provider may not know if you need more testing or further treatment. °HOME CARE INSTRUCTIONS  °Monitor your abdominal pain for any changes. The following actions may help to alleviate any discomfort you are experiencing: °· Only take over-the-counter or prescription medicines as directed by your health care provider. °· Do not take laxatives unless directed to do so by your health care provider. °· Try a clear liquid diet (broth, tea, or water) as directed by your health care provider. Slowly move to a bland diet as tolerated. °SEEK MEDICAL CARE IF: °· You have unexplained abdominal pain. °· You have abdominal pain associated with nausea or diarrhea. °· You have pain when you urinate or have a bowel movement. °· You experience abdominal pain that wakes you in the night. °· You have abdominal pain that is worsened or improved by eating food. °· You have abdominal pain that is worsened with eating fatty foods. °SEEK IMMEDIATE MEDICAL CARE IF:  °· Your pain does not go away within 2 hours. °· You have a fever. °· You keep throwing up (vomiting). °· Your pain is felt only in portions of the abdomen, such as the right side or the left lower portion of the abdomen. °· You pass bloody or black tarry stools. °MAKE SURE YOU: °· Understand these instructions.   °· Will watch your condition.   °· Will get help right away if you are not doing well or get worse.   °Document Released: 02/27/2005 Document Revised: 03/10/2013 Document Reviewed: 01/27/2013 °[employer]® Patient  Information ©2014 [employer]. ° °

## 2013-06-18 LAB — URINE CULTURE
Colony Count: NO GROWTH
Organism ID, Bacteria: NO GROWTH

## 2013-06-21 ENCOUNTER — Ambulatory Visit (HOSPITAL_BASED_OUTPATIENT_CLINIC_OR_DEPARTMENT_OTHER)
Admission: RE | Admit: 2013-06-21 | Discharge: 2013-06-21 | Disposition: A | Discharge status: Home / Self Care | Payer: Medicare Other | Source: Ambulatory Visit | Attending: Family Medicine | Admitting: Family Medicine

## 2013-06-21 DIAGNOSIS — K5730 Diverticulosis of large intestine without perforation or abscess without bleeding: Secondary | ICD-10-CM | POA: Diagnosis not present

## 2013-06-21 DIAGNOSIS — R1032 Left lower quadrant pain: Secondary | ICD-10-CM | POA: Diagnosis not present

## 2013-06-21 DIAGNOSIS — N281 Cyst of kidney, acquired: Secondary | ICD-10-CM | POA: Diagnosis not present

## 2013-06-21 DIAGNOSIS — N200 Calculus of kidney: Secondary | ICD-10-CM | POA: Diagnosis not present

## 2013-06-21 DIAGNOSIS — R197 Diarrhea, unspecified: Secondary | ICD-10-CM | POA: Insufficient documentation

## 2013-06-21 DIAGNOSIS — M899 Disorder of bone, unspecified: Secondary | ICD-10-CM | POA: Diagnosis not present

## 2013-06-21 DIAGNOSIS — M949 Disorder of cartilage, unspecified: Secondary | ICD-10-CM

## 2013-06-21 MED ORDER — IOHEXOL 300 MG/ML  SOLN
100.0000 mL | Freq: Once | INTRAMUSCULAR | Status: AC | PRN
  Administered 2013-06-21: 100 mL via INTRAVENOUS

## 2013-06-22 ENCOUNTER — Telehealth: Payer: Self-pay | Admitting: *Deleted

## 2013-06-22 NOTE — Telephone Encounter (Signed)
Patient was in the office inquiring of CT abdomen results. Advised that it must first be reviewed by the physician and then we would call her with any recommendations.

## 2013-06-23 ENCOUNTER — Telehealth: Payer: Self-pay

## 2013-06-23 DIAGNOSIS — R1032 Left lower quadrant pain: Secondary | ICD-10-CM | POA: Diagnosis not present

## 2013-06-23 DIAGNOSIS — R197 Diarrhea, unspecified: Secondary | ICD-10-CM | POA: Diagnosis not present

## 2013-06-23 MED ORDER — METRONIDAZOLE 500 MG PO TABS: 500.0000 mg | ORAL_TABLET | Freq: Three times a day (TID) | ORAL | Status: DC

## 2013-06-23 MED ORDER — CIPROFLOXACIN HCL 500 MG PO TABS: 500.0000 mg | ORAL_TABLET | Freq: Two times a day (BID) | ORAL | Status: DC

## 2013-06-23 NOTE — Telephone Encounter (Signed)
Message copied by Ewing Schlein on Wed Jun 23, 2013  3:44 PM ------      Message from: Rosalita Chessman      Created: Wed Jun 23, 2013 10:11 AM       ? Mild diverticulitis---  cipro 500 mg 1 po bid for 7 days      Flagyl 500 mg 1 po q8h for 7 days            She does have a stone in R kidney-- nonobstructing            Refer to GI if symptoms do not improve ------

## 2013-06-23 NOTE — Telephone Encounter (Signed)
Patient has been made aware and Medications have been sent to the pharmacy.      KP

## 2013-06-24 ENCOUNTER — Telehealth: Payer: Self-pay

## 2013-06-24 LAB — CLOSTRIDIUM DIFFICILE EIA: CDIFTX: NEGATIVE

## 2013-06-24 NOTE — Telephone Encounter (Signed)
Call from patient and she stated she has a rash on the lower half of the body that developed on Tues after ingesting the contrast dye on Mon for her CT. Per Dr.Tabori patient is to take Benadryl today and follow up in the office tomorrow for an evaluation. Patient agreed and apt scheduled. Contrast added as an allergy.     KP

## 2013-06-25 ENCOUNTER — Encounter: Payer: Self-pay | Admitting: Family Medicine

## 2013-06-25 ENCOUNTER — Ambulatory Visit (INDEPENDENT_AMBULATORY_CARE_PROVIDER_SITE_OTHER): Payer: Medicare Other | Admitting: Family Medicine

## 2013-06-25 VITALS — BP 124/78 | HR 78 | Temp 98.2°F | Resp 16 | Wt 206.4 lb

## 2013-06-25 DIAGNOSIS — R21 Rash and other nonspecific skin eruption: Secondary | ICD-10-CM

## 2013-06-25 MED ORDER — TRIAMCINOLONE ACETONIDE 0.1 % EX OINT: 1.0000 "application " | TOPICAL_OINTMENT | Freq: Two times a day (BID) | CUTANEOUS | Status: DC

## 2013-06-25 MED ORDER — HYDROXYZINE HCL 25 MG PO TABS: 25.0000 mg | ORAL_TABLET | Freq: Three times a day (TID) | ORAL | Status: DC | PRN

## 2013-06-25 NOTE — Assessment & Plan Note (Signed)
New.  May or may not be related to contrast but timing is suspicious.  Contrast added to med list- pt will need to pre-medicate prior to next required imaging studies.  Start Triamcinolone ointment twice daily and hydroxyzine prn.  Reviewed supportive care and red flags that should prompt return.  Pt expressed understanding and is in agreement w/ plan.

## 2013-06-25 NOTE — Patient Instructions (Signed)
Follow up as needed Use the ointment twice daily for the itching Take the Hydroxyzine as needed for itching- may cause drowsiness Start the Cipro as directed by Dr Etter Sjogren Call with any questions or concerns Hang in there!!

## 2013-06-25 NOTE — Progress Notes (Signed)
Pre visit review using our clinic review tool, if applicable. No additional management support is needed unless otherwise documented below in the visit note. 

## 2013-06-25 NOTE — Progress Notes (Signed)
   Subjective:    Patient ID: [name], female    DOB: 10-Apr-1946, 68 y.o.   MRN: [id number]  HPI Rash- pt reports sxs started Tuesday w/ itching.  Had CT scan w/ contrast on Monday.  Very faint rash on trunk.  Nothing on arms, legs, hands, feet.  Did not take benadryl as directed in phone note.  sxs improved w/ warm shower.   Review of Systems For ROS see HPI     Objective:   Physical Exam  Vitals reviewed. Constitutional: She appears well-developed and well-nourished. No distress.  Skin: Skin is warm and dry. Rash (faint macular rash on trunk) noted.          Assessment & Plan:

## 2013-06-27 LAB — STOOL CULTURE

## 2013-06-28 DIAGNOSIS — M1610 Unilateral primary osteoarthritis, unspecified hip: Secondary | ICD-10-CM | POA: Diagnosis not present

## 2013-06-28 DIAGNOSIS — M169 Osteoarthritis of hip, unspecified: Secondary | ICD-10-CM | POA: Diagnosis not present

## 2013-06-28 DIAGNOSIS — M25559 Pain in unspecified hip: Secondary | ICD-10-CM | POA: Diagnosis not present

## 2013-06-29 ENCOUNTER — Telehealth: Payer: Self-pay | Admitting: *Deleted

## 2013-06-29 DIAGNOSIS — K5792 Diverticulitis of intestine, part unspecified, without perforation or abscess without bleeding: Secondary | ICD-10-CM

## 2013-06-29 DIAGNOSIS — R159 Full incontinence of feces: Secondary | ICD-10-CM

## 2013-06-29 NOTE — Telephone Encounter (Signed)
Patient called and stated that she is having problems with her diverticulitis. Patient states that she is having a bowel movement without knowing that she is having one. Patient states that she is not sure if this is coming from the the antibiotic that was prescribed. Patient would like to know what she needs to do because this very strange to her. Please advise. SW

## 2013-06-29 NOTE — Telephone Encounter (Signed)
Message copied by Dominque Marlin P on Wed Jun 23, 2013  3:44 PM ------      Message from: LOWNE, YVONNE R      Created: Wed Jun 23, 2013 10:11 AM       ? Mild diverticulitis---  cipro 500 mg 1 po bid for 7 days      Flagyl 500 mg 1 po q8h for 7 days            She does have a stone in R kidney-- nonobstructing            Refer to GI if symptoms do not improve ------ 

## 2013-06-29 NOTE — Telephone Encounter (Signed)
Ref put in.      KP 

## 2013-07-06 ENCOUNTER — Ambulatory Visit: Payer: Medicare Other

## 2013-07-06 ENCOUNTER — Ambulatory Visit (INDEPENDENT_AMBULATORY_CARE_PROVIDER_SITE_OTHER): Payer: Medicare Other | Admitting: *Deleted

## 2013-07-06 DIAGNOSIS — R159 Full incontinence of feces: Secondary | ICD-10-CM | POA: Diagnosis not present

## 2013-07-06 DIAGNOSIS — K5730 Diverticulosis of large intestine without perforation or abscess without bleeding: Secondary | ICD-10-CM | POA: Diagnosis not present

## 2013-07-06 DIAGNOSIS — K219 Gastro-esophageal reflux disease without esophagitis: Secondary | ICD-10-CM | POA: Diagnosis not present

## 2013-07-06 DIAGNOSIS — E538 Deficiency of other specified B group vitamins: Secondary | ICD-10-CM

## 2013-07-06 DIAGNOSIS — K589 Irritable bowel syndrome without diarrhea: Secondary | ICD-10-CM | POA: Diagnosis not present

## 2013-07-06 MED ORDER — CYANOCOBALAMIN 1000 MCG/ML IJ SOLN
1000.0000 ug | Freq: Once | INTRAMUSCULAR | Status: AC
  Administered 2013-07-06: 1000 ug via INTRAMUSCULAR

## 2013-07-19 ENCOUNTER — Encounter: Payer: Medicare Other | Admitting: Gynecology

## 2013-07-23 ENCOUNTER — Emergency Department (HOSPITAL_BASED_OUTPATIENT_CLINIC_OR_DEPARTMENT_OTHER): Payer: Worker's Compensation

## 2013-07-23 ENCOUNTER — Encounter (HOSPITAL_BASED_OUTPATIENT_CLINIC_OR_DEPARTMENT_OTHER): Payer: Self-pay | Admitting: Emergency Medicine

## 2013-07-23 ENCOUNTER — Emergency Department (HOSPITAL_BASED_OUTPATIENT_CLINIC_OR_DEPARTMENT_OTHER)
Admission: EM | Admit: 2013-07-23 | Discharge: 2013-07-23 | Disposition: A | Discharge status: Home / Self Care | Payer: Worker's Compensation | Attending: Emergency Medicine | Admitting: Emergency Medicine

## 2013-07-23 DIAGNOSIS — Z8544 Personal history of malignant neoplasm of other female genital organs: Secondary | ICD-10-CM | POA: Insufficient documentation

## 2013-07-23 DIAGNOSIS — Z87891 Personal history of nicotine dependence: Secondary | ICD-10-CM | POA: Insufficient documentation

## 2013-07-23 DIAGNOSIS — Z23 Encounter for immunization: Secondary | ICD-10-CM | POA: Insufficient documentation

## 2013-07-23 DIAGNOSIS — W1809XA Striking against other object with subsequent fall, initial encounter: Secondary | ICD-10-CM | POA: Insufficient documentation

## 2013-07-23 DIAGNOSIS — R51 Headache: Secondary | ICD-10-CM | POA: Diagnosis not present

## 2013-07-23 DIAGNOSIS — K219 Gastro-esophageal reflux disease without esophagitis: Secondary | ICD-10-CM | POA: Insufficient documentation

## 2013-07-23 DIAGNOSIS — Z87798 Personal history of other (corrected) congenital malformations: Secondary | ICD-10-CM | POA: Insufficient documentation

## 2013-07-23 DIAGNOSIS — Z8669 Personal history of other diseases of the nervous system and sense organs: Secondary | ICD-10-CM | POA: Insufficient documentation

## 2013-07-23 DIAGNOSIS — M129 Arthropathy, unspecified: Secondary | ICD-10-CM | POA: Insufficient documentation

## 2013-07-23 DIAGNOSIS — M810 Age-related osteoporosis without current pathological fracture: Secondary | ICD-10-CM | POA: Insufficient documentation

## 2013-07-23 DIAGNOSIS — Z79899 Other long term (current) drug therapy: Secondary | ICD-10-CM | POA: Insufficient documentation

## 2013-07-23 DIAGNOSIS — W010XXA Fall on same level from slipping, tripping and stumbling without subsequent striking against object, initial encounter: Secondary | ICD-10-CM | POA: Insufficient documentation

## 2013-07-23 DIAGNOSIS — K589 Irritable bowel syndrome without diarrhea: Secondary | ICD-10-CM | POA: Insufficient documentation

## 2013-07-23 DIAGNOSIS — Y9389 Activity, other specified: Secondary | ICD-10-CM | POA: Insufficient documentation

## 2013-07-23 DIAGNOSIS — IMO0002 Reserved for concepts with insufficient information to code with codable children: Secondary | ICD-10-CM | POA: Diagnosis not present

## 2013-07-23 DIAGNOSIS — Z8711 Personal history of peptic ulcer disease: Secondary | ICD-10-CM | POA: Insufficient documentation

## 2013-07-23 DIAGNOSIS — S199XXA Unspecified injury of neck, initial encounter: Secondary | ICD-10-CM | POA: Diagnosis not present

## 2013-07-23 DIAGNOSIS — Z8742 Personal history of other diseases of the female genital tract: Secondary | ICD-10-CM | POA: Insufficient documentation

## 2013-07-23 DIAGNOSIS — Z8619 Personal history of other infectious and parasitic diseases: Secondary | ICD-10-CM | POA: Insufficient documentation

## 2013-07-23 DIAGNOSIS — S0993XA Unspecified injury of face, initial encounter: Secondary | ICD-10-CM | POA: Diagnosis not present

## 2013-07-23 DIAGNOSIS — W19XXXA Unspecified fall, initial encounter: Secondary | ICD-10-CM

## 2013-07-23 DIAGNOSIS — Z9889 Other specified postprocedural states: Secondary | ICD-10-CM | POA: Insufficient documentation

## 2013-07-23 DIAGNOSIS — M542 Cervicalgia: Secondary | ICD-10-CM | POA: Diagnosis not present

## 2013-07-23 DIAGNOSIS — M545 Low back pain, unspecified: Secondary | ICD-10-CM | POA: Diagnosis not present

## 2013-07-23 DIAGNOSIS — Y9289 Other specified places as the place of occurrence of the external cause: Secondary | ICD-10-CM | POA: Insufficient documentation

## 2013-07-23 DIAGNOSIS — Z792 Long term (current) use of antibiotics: Secondary | ICD-10-CM | POA: Insufficient documentation

## 2013-07-23 DIAGNOSIS — S0101XA Laceration without foreign body of scalp, initial encounter: Secondary | ICD-10-CM

## 2013-07-23 DIAGNOSIS — S0100XA Unspecified open wound of scalp, initial encounter: Secondary | ICD-10-CM | POA: Insufficient documentation

## 2013-07-23 DIAGNOSIS — S0990XA Unspecified injury of head, initial encounter: Secondary | ICD-10-CM | POA: Diagnosis not present

## 2013-07-23 MED ORDER — HYDROCODONE-ACETAMINOPHEN 5-325 MG PO TABS
1.0000 | ORAL_TABLET | Freq: Once | ORAL | Status: AC
  Administered 2013-07-23: 1 via ORAL
  Filled 2013-07-23: qty 1

## 2013-07-23 MED ORDER — TETANUS-DIPHTH-ACELL PERTUSSIS 5-2.5-18.5 LF-MCG/0.5 IM SUSP
0.5000 mL | Freq: Once | INTRAMUSCULAR | Status: AC
  Administered 2013-07-23: 0.5 mL via INTRAMUSCULAR
  Filled 2013-07-23: qty 0.5

## 2013-07-23 NOTE — ED Notes (Signed)
Estill Bamberg called back-denies need for UDS or paperwork.  States that they know what happened to patient.

## 2013-07-23 NOTE — Discharge Instructions (Signed)
Head Injury, Adult Follow up with your doctor in 1 week for suture removal. Return to the ED if you develop worsening headache, confusion, vomiting or any other concerns. You have received a head injury. It does not appear serious at this time. Headaches and vomiting are common following head injury. It should be easy to awaken from sleeping. Sometimes it is necessary for you to stay in the emergency department for a while for observation. Sometimes admission to the hospital may be needed. After injuries such as yours, most problems occur within the first 24 hours, but side effects may occur up to 7 10 days after the injury. It is important for you to carefully monitor your condition and contact your health care provider or seek immediate medical care if there is a change in your condition. WHAT ARE THE TYPES OF HEAD INJURIES? Head injuries can be as minor as a bump. Some head injuries can be more severe. More severe head injuries include:  A jarring injury to the brain (concussion).  A bruise of the brain (contusion). This mean there is bleeding in the brain that can cause swelling.  A cracked skull (skull fracture).  Bleeding in the brain that collects, clots, and forms a bump (hematoma). WHAT CAUSES A HEAD INJURY? A serious head injury is most likely to happen to someone who is in a car wreck and is not wearing a seat belt. Other causes of major head injuries include bicycle or motorcycle accidents, sports injuries, and falls. HOW ARE HEAD INJURIES DIAGNOSED? A complete history of the event leading to the injury and your current symptoms will be helpful in diagnosing head injuries. Many times, pictures of the brain, such as CT or MRI are needed to see the extent of the injury. Often, an overnight hospital stay is necessary for observation.  WHEN SHOULD I SEEK IMMEDIATE MEDICAL CARE?  You should get help right away if:  You have confusion or drowsiness.  You feel sick to your stomach  (nauseous) or have continued, forceful vomiting.  You have dizziness or unsteadiness that is getting worse.  You have severe, continued headaches not relieved by medicine. Only take over-the-counter or prescription medicines for pain, fever, or discomfort as directed by your health care provider.  You do not have normal function of the arms or legs or are unable to walk.  You notice changes in the black spots in the center of the colored part of your eye (pupil).  You have a clear or bloody fluid coming from your nose or ears.  You have a loss of vision. During the next 24 hours after the injury, you must stay with someone who can watch you for the warning signs. This person should contact local emergency services (911 in the [place] if you have seizures, you become unconscious, or you are unable to wake up. HOW CAN I PREVENT A HEAD INJURY IN THE FUTURE? The most important factor for preventing major head injuries is avoiding motor vehicle accidents. To minimize the potential for damage to your head, it is crucial to wear seat belts while riding in motor vehicles. Wearing helmets while bike riding and playing collision sports (like football) is also helpful. Also, avoiding dangerous activities around the house will further help reduce your risk of head injury.  WHEN CAN I RETURN TO NORMAL ACTIVITIES AND ATHLETICS? You should be reevaluated by your health care provider before returning to these activities. If you have any of the following symptoms, you should not return  to activities or contact sports until 1 week after the symptoms have stopped:  Persistent headache.  Dizziness or vertigo.  Poor attention and concentration.  Confusion.  Memory problems.  Nausea or vomiting.  Fatigue or tire easily.  Irritability.  Intolerant of bright lights or loud noises.  Anxiety or depression.  Disturbed sleep. MAKE SURE YOU:   Understand these instructions.  Will watch your  condition.  Will get help right away if you are not doing well or get worse. Document Released: 05/20/2005 Document Revised: 03/10/2013 Document Reviewed: 01/25/2013 [facility] Patient Information 2014 [place].  Laceration Care, Adult A laceration is a cut or lesion that goes through all layers of the skin and into the tissue just beneath the skin. TREATMENT  Some lacerations may not require closure. Some lacerations may not be able to be closed due to an increased risk of infection. It is important to see your caregiver as soon as possible after an injury to minimize the risk of infection and maximize the opportunity for successful closure. If closure is appropriate, pain medicines may be given, if needed. The wound will be cleaned to help prevent infection. Your caregiver will use stitches (sutures), staples, wound glue (adhesive), or skin adhesive strips to repair the laceration. These tools bring the skin edges together to allow for faster healing and a better cosmetic outcome. However, all wounds will heal with a scar. Once the wound has healed, scarring can be minimized by covering the wound with sunscreen during the day for 1 full year. HOME CARE INSTRUCTIONS  For sutures or staples:  Keep the wound clean and dry.  If you were given a bandage (dressing), you should change it at least once a day. Also, change the dressing if it becomes wet or dirty, or as directed by your caregiver.  Wash the wound with soap and water 2 times a day. Rinse the wound off with water to remove all soap. Pat the wound dry with a clean towel.  After cleaning, apply a thin layer of the antibiotic ointment as recommended by your caregiver. This will help prevent infection and keep the dressing from sticking.  You may shower as usual after the first 24 hours. Do not soak the wound in water until the sutures are removed.  Only take over-the-counter or prescription medicines for pain, discomfort, or fever as  directed by your caregiver.  Get your sutures or staples removed as directed by your caregiver. For skin adhesive strips:  Keep the wound clean and dry.  Do not get the skin adhesive strips wet. You may bathe carefully, using caution to keep the wound dry.  If the wound gets wet, pat it dry with a clean towel.  Skin adhesive strips will fall off on their own. You may trim the strips as the wound heals. Do not remove skin adhesive strips that are still stuck to the wound. They will fall off in time. For wound adhesive:  You may briefly wet your wound in the shower or bath. Do not soak or scrub the wound. Do not swim. Avoid periods of heavy perspiration until the skin adhesive has fallen off on its own. After showering or bathing, gently pat the wound dry with a clean towel.  Do not apply liquid medicine, cream medicine, or ointment medicine to your wound while the skin adhesive is in place. This may loosen the film before your wound is healed.  If a dressing is placed over the wound, be careful not to  apply tape directly over the skin adhesive. This may cause the adhesive to be pulled off before the wound is healed.  Avoid prolonged exposure to sunlight or tanning lamps while the skin adhesive is in place. Exposure to ultraviolet light in the first year will darken the scar.  The skin adhesive will usually remain in place for 5 to 10 days, then naturally fall off the skin. Do not pick at the adhesive film. You may need a tetanus shot if:  You cannot remember when you had your last tetanus shot.  You have never had a tetanus shot. If you get a tetanus shot, your arm may swell, get red, and feel warm to the touch. This is common and not a problem. If you need a tetanus shot and you choose not to have one, there is a rare chance of getting tetanus. Sickness from tetanus can be serious. SEEK MEDICAL CARE IF:   You have redness, swelling, or increasing pain in the wound.  You see a red  line that goes away from the wound.  You have yellowish-white fluid (pus) coming from the wound.  You have a fever.  You notice a bad smell coming from the wound or dressing.  Your wound breaks open before or after sutures have been removed.  You notice something coming out of the wound such as wood or glass.  Your wound is on your hand or foot and you cannot move a finger or toe. SEEK IMMEDIATE MEDICAL CARE IF:   Your pain is not controlled with prescribed medicine.  You have severe swelling around the wound causing pain and numbness or a change in color in your arm, hand, leg, or foot.  Your wound splits open and starts bleeding.  You have worsening numbness, weakness, or loss of function of any joint around or beyond the wound.  You develop painful lumps near the wound or on the skin anywhere on your body. MAKE SURE YOU:   Understand these instructions.  Will watch your condition.  Will get help right away if you are not doing well or get worse. Document Released: 05/20/2005 Document Revised: 08/12/2011 Document Reviewed: 11/13/2010 [facility] Patient Information 2014 [place], [place].

## 2013-07-23 NOTE — ED Provider Notes (Signed)
CSN: [id number]     Arrival date & time 07/23/13  1552 History   First MD Initiated Contact with Patient 07/23/13 1604     Chief Complaint  Patient presents with  . Fall     (Consider location/radiation/quality/duration/timing/severity/associated sxs/prior Treatment) HPI Comments: Patient presents with head and back pain after mechanical fall. She states she slipped on the ice and fell backward striking the back of her head. She does not think she lost consciousness. She has a laceration to the back of her head. Bleeding is controlled. She denies any nausea or vomiting or vision change. She she complains of pain in her low back and has had previous back surgery. No focal weakness, numbness or tingling. No incontinence. She takes Aleve but no blood thinners. She denies any chest pain or abdominal pain.   The history is provided by the patient.    Past Medical History  Diagnosis Date  . Arthritis   . Diverticulosis   . GERD (gastroesophageal reflux disease)   . Osteoporosis   . Tuberous sclerosis     EXTERNAL LESIONS--  MAINLY HANDS  . Seasonal allergies   . Vulvar dysplasia   . H/O hiatal hernia   . History of diverticulitis of colon   . Nodule of right lung     BENIGN---  CT  10-28-2008  . History of gastric ulcer   . Shingles     march 2014--  back  . 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 OSA (obstructive sleep apnea)     mild-- no cpap  . IBS (irritable bowel syndrome)   . Seizure    Past Surgical History  Procedure Laterality Date  . Bladder surgery  1991    bladder reconstruction of torsion  . Colonoscopy    . Polypectomy    . Upper gastrointestinal endoscopy    . Cataract extraction w/ intraocular lens  implant, bilateral    . Cholecystectomy  11-18-2003  . Cardiac catheterization  06-26-2001    NORMAL CORONARY ARTERIES  . Orif right bimalleolar ankle fx   12-18-2003  . 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 Lumbar disc surgery  1985  . Dilation and curettage of uterus  multiple -- last one 1975  . Vaginal hysterectomy  1975  . Hemorrhoid surgery  1970's  . Co2 laser application N/A [id number]    Procedure: CO2 LASER APPLICATION;  Surgeon: Terrance Mass, MD;  Location: [facility];  Service: Gynecology;  Laterality: N/A;  CPT 57065  CO2 laser of vaginal and vulvar lesions.  . Back surgery     Family History  Problem Relation Age of Onset  . Heart disease Mother   . Tuberous sclerosis Mother   . Tuberous sclerosis Sister   . Tuberous sclerosis Daughter   . Tuberous sclerosis Son   . Tuberous sclerosis Maternal Aunt   . Tuberous sclerosis Maternal Grandmother   . Tuberous sclerosis Maternal Aunt   . Tuberous sclerosis Maternal Aunt   . Tuberous sclerosis Cousin    History  Substance Use Topics  . Smoking status: Former Smoker -- 2.50 packs/day for 20 years    Types: Cigarettes    Quit date: 08/28/1992  . Smokeless tobacco: Never Used  . Alcohol Use: Yes     Comment: glass of  wine occassional   OB History   Grav Para Term Preterm Abortions TAB SAB Ect Mult Living                 Review of Systems  Constitutional: Negative for fever, activity change and appetite change.  HENT: Negative for congestion and rhinorrhea.   Respiratory: Negative for cough, chest tightness and shortness of breath.   Cardiovascular: Negative for chest pain.  Gastrointestinal: Negative for nausea, vomiting and abdominal pain.  Genitourinary: Negative for dysuria and hematuria.  Musculoskeletal: Positive for back pain. Negative for neck pain.  Skin: Negative for rash.  Neurological: Positive for headaches. Negative for dizziness.  A complete 10 system review of systems was obtained and all systems are negative except as noted in the HPI and PMH.      Allergies  Ciprofloxacin;  Percocet; Zoloft; Contrast media; and Sulfa antibiotics  Home Medications   Current Outpatient Rx  Name  Route  Sig  Dispense  Refill  . cyanocobalamin (,VITAMIN B-12,) 1000 MCG/ML injection   Intramuscular   Inject 1 mL (1,000 mcg total) into the muscle once.   1 mL   0   . dicyclomine (BENTYL) 20 MG tablet      2 po q6h prn   120 tablet   0   . ergocalciferol (VITAMIN D2) 50000 UNITS capsule      Take one tablet every other week for 6 mos.   6 capsule   1   . omeprazole (PRILOSEC) 40 MG capsule   Oral   Take 40 mg by mouth daily.         . Probiotic Product (PROBIOTIC & ACIDOPHILUS EX ST PO)   Oral   Take 1 tablet by mouth daily.         Marland Kitchen alendronate (FOSAMAX) 70 MG tablet   Oral   Take 1 tablet (70 mg total) by mouth every 7 (seven) days. Take with a full glass of water on an empty stomach.   4 tablet   11   . cholestyramine (QUESTRAN) 4 G packet   Oral   Take 1 packet by mouth 3 (three) times daily with meals.         Marland Kitchen HYDROcodone-acetaminophen (NORCO/VICODIN) 5-325 MG per tablet   Oral   Take 1 tablet by mouth every 6 (six) hours as needed for pain.   30 tablet   0   . hydrOXYzine (ATARAX/VISTARIL) 25 MG tablet   Oral   Take 1 tablet (25 mg total) by mouth 3 (three) times daily as needed.   45 tablet   0   . metroNIDAZOLE (FLAGYL) 500 MG tablet   Oral   Take 1 tablet (500 mg total) by mouth every 8 (eight) hours.   21 tablet   0   . NONFORMULARY OR COMPOUNDED ITEM      Estradiol .02% 1 ML Prefilled Applicator Sig: apply vaginally twice a week #90 Day Supply with 4 refills   1 each   4   . traZODone (DESYREL) 50 MG tablet   Oral   Take 2 tablets (100 mg total) by mouth at bedtime.   60 tablet   3   . triamcinolone ointment (KENALOG) 0.1 %   Topical   Apply 1 application topically 2 (two) times daily.   90 g   1   . valACYclovir (VALTREX) 1000 MG tablet   Oral   Take 1 tablet (1,000 mg total) by mouth 3 (three) times  daily.  21 tablet   0    BP 187/58  Pulse 78  Temp(Src) 98.1 F (36.7 C) (Oral)  Resp 20  Ht 5\' 2"  (1.575 m)  Wt 206 lb (93.441 kg)  BMI 37.67 kg/m2  SpO2 100% Physical Exam  Constitutional: She is oriented to person, place, and time. She appears well-developed and well-nourished. No distress.  HENT:  Head: Normocephalic and atraumatic.  Mouth/Throat: Oropharynx is clear and moist. No oropharyngeal exudate.  2 cm laceration to posterior scalp. Bleeding controlled  Eyes: Conjunctivae and EOM are normal. Pupils are equal, round, and reactive to light.  Neck: Normal range of motion. Neck supple.  No C spine tenderness  Cardiovascular: Normal rate, regular rhythm and normal heart sounds.   No murmur heard. Pulmonary/Chest: Effort normal and breath sounds normal. No respiratory distress.  Abdominal: Soft. There is no tenderness. There is no rebound and no guarding.  Musculoskeletal: Normal range of motion. She exhibits tenderness. She exhibits no edema.  TTP lumbar spine without stepoff or deformity  Neurological: She is alert and oriented to person, place, and time. No cranial nerve deficit. She exhibits normal muscle tone. Coordination normal.  CN 2-12 intact, no ataxia on finger to nose, no nystagmus, 5/5 strength throughout, no pronator drift, Romberg negative, normal gait.   Skin: Skin is warm.    ED Course  LACERATION REPAIR Date/Time: 07/23/2013 5:46 PM Performed by: Ezequiel Essex Authorized by: Ezequiel Essex Consent: Verbal consent obtained. Risks and benefits: risks, benefits and alternatives were discussed Consent given by: patient Patient understanding: patient states understanding of the procedure being performed Patient consent: the patient's understanding of the procedure matches consent given Procedure consent: procedure consent matches procedure scheduled Patient identity confirmed: verbally with patient and provided demographic data Time out:  Immediately prior to procedure a "time out" was called to verify the correct patient, procedure, equipment, support staff and site/side marked as required. Body area: head/neck Location details: scalp Laceration length: 2 cm Tendon involvement: none Nerve involvement: none Vascular damage: no Anesthesia: local infiltration Local anesthetic: lidocaine 1% with epinephrine Anesthetic total: 4 ml Patient sedated: no Preparation: Patient was prepped and draped in the usual sterile fashion. Irrigation solution: saline Irrigation method: syringe Amount of cleaning: standard Debridement: none Degree of undermining: none Skin closure: staples Number of sutures: 4 Technique: simple Approximation: close Approximation difficulty: simple Dressing: 4x4 sterile gauze Patient tolerance: Patient tolerated the procedure well with no immediate complications.   (including critical care time) Labs Review Labs Reviewed - No data to display Imaging Review Dg Lumbar Spine Complete  07/23/2013   CLINICAL DATA:  [place] on ice with low back pain  EXAM: LUMBAR SPINE - COMPLETE 4+ VIEW  COMPARISON:  CT abdomen pelvis of 06/21/2013  FINDINGS: The lumbar vertebrae are in normal alignment. Degenerative disc disease is again noted at L4-5 with some loss of disc space and sclerosis with spurring. No compression deformity is seen. There is degenerative change in the facet joints of L4-5 and L5-S1. Sclerotic appearing bone lesions have previously been described and appear stable.  IMPRESSION: No acute abnormality.  Degenerative disc disease primarily at L4-5.   Electronically Signed   By: Ivar Drape M.D.   On: 07/23/2013 16:58   Ct Head Wo Contrast  07/23/2013   CLINICAL DATA:  Headache and neck pain status post fall.  EXAM: CT HEAD WITHOUT CONTRAST  CT CERVICAL SPINE WITHOUT CONTRAST  TECHNIQUE: Multidetector CT imaging of the head and cervical spine was performed following the standard  protocol without intravenous  contrast. Multiplanar CT image reconstructions of the cervical spine were also generated.  COMPARISON:  None.  FINDINGS: CT HEAD FINDINGS  There is mild age appropriate diffuse cerebral and cerebellar atrophy. The ventricles are normal in size and position. There are calcifications within the right aspect of the questioned left aspect of the corpus callosum as well as at the gray-white junction in the right frontal lobe adjacent to the posterior horn of the right lateral ventricle and in the left occipital lobe. No calcifications are demonstrated within the cerebellum or brainstem. There is no evidence of an acute intracranial hemorrhage nor of an evolving ischemic infarction. Subtle decreased density in the deep white matter of both cerebral hemispheres are consistent with chronic small vessel ischemic type change.  At bone window settings the observed portions of the paranasal sinuses and mastoid air cells are clear. There is no evidence of an acute skull fracture. There is heterogeneous mineralization of the calvarium with numerous sclerotic lesions demonstrated.  CT CERVICAL SPINE FINDINGS  There is reversal of the normal cervical lordosis which may reflect muscle spasm. There are numerous sclerotic foci within the cervical vertebral bodies and posterior elements. No compromise of the bony spinal canal is demonstrated. The prevertebral soft tissues appear normal. There is mild disc space narrowing at C5-6 and at C6-7. The odontoid is intact. The bony ring at each cervical level appears intact. The pulmonary apices are clear.  IMPRESSION: 1. There is no evidence of an acute intracranial hemorrhage nor of an evolving ischemic infarction. There are findings consistent with chronic small vessel ischemic type change. 2. Coarse calcifications in the brain likely reflect the sequelae of the patient's known tuberous sclerosis. No suspicious masses or areas of edema are demonstrated. 3. There is no evidence of an acute  skull fracture. There is heterogeneous mineralization of the calvarium. 4. There is no evidence of an acute cervical spine fracture nor dislocation. There is reversal of the normal cervical lordosis consistent with muscle spasm. 5. Widespread sclerotic foci within the vertebral bodies and posterior elements are present but similar findings were noted on sagittal images of the thoracic spine on a chest CT scan of Oct 28, 2008.   Electronically Signed   By: David  Martinique   On: 07/23/2013 17:26   Ct Cervical Spine Wo Contrast  07/23/2013   CLINICAL DATA:  Headache and neck pain status post fall.  EXAM: CT HEAD WITHOUT CONTRAST  CT CERVICAL SPINE WITHOUT CONTRAST  TECHNIQUE: Multidetector CT imaging of the head and cervical spine was performed following the standard protocol without intravenous contrast. Multiplanar CT image reconstructions of the cervical spine were also generated.  COMPARISON:  None.  FINDINGS: CT HEAD FINDINGS  There is mild age appropriate diffuse cerebral and cerebellar atrophy. The ventricles are normal in size and position. There are calcifications within the right aspect of the questioned left aspect of the corpus callosum as well as at the gray-white junction in the right frontal lobe adjacent to the posterior horn of the right lateral ventricle and in the left occipital lobe. No calcifications are demonstrated within the cerebellum or brainstem. There is no evidence of an acute intracranial hemorrhage nor of an evolving ischemic infarction. Subtle decreased density in the deep white matter of both cerebral hemispheres are consistent with chronic small vessel ischemic type change.  At bone window settings the observed portions of the paranasal sinuses and mastoid air cells are clear. There is no evidence of an acute  skull fracture. There is heterogeneous mineralization of the calvarium with numerous sclerotic lesions demonstrated.  CT CERVICAL SPINE FINDINGS  There is reversal of the normal  cervical lordosis which may reflect muscle spasm. There are numerous sclerotic foci within the cervical vertebral bodies and posterior elements. No compromise of the bony spinal canal is demonstrated. The prevertebral soft tissues appear normal. There is mild disc space narrowing at C5-6 and at C6-7. The odontoid is intact. The bony ring at each cervical level appears intact. The pulmonary apices are clear.  IMPRESSION: 1. There is no evidence of an acute intracranial hemorrhage nor of an evolving ischemic infarction. There are findings consistent with chronic small vessel ischemic type change. 2. Coarse calcifications in the brain likely reflect the sequelae of the patient's known tuberous sclerosis. No suspicious masses or areas of edema are demonstrated. 3. There is no evidence of an acute skull fracture. There is heterogeneous mineralization of the calvarium. 4. There is no evidence of an acute cervical spine fracture nor dislocation. There is reversal of the normal cervical lordosis consistent with muscle spasm. 5. Widespread sclerotic foci within the vertebral bodies and posterior elements are present but similar findings were noted on sagittal images of the thoracic spine on a chest CT scan of Oct 28, 2008.   Electronically Signed   By: David  Martinique   On: 07/23/2013 17:26    EKG Interpretation   None       MDM   Final diagnoses:  Fall  Scalp laceration    Mechanical fall with head and back pain. No loss of consciousness. Neurologically intact.  Imaging is negative for acute traumatic injury. No evidence of skull fracture or hemorrhage. Chronic changes of tuberous sclerosis seen.  Tetanus updated. Wound repaired as above. Patient is able to ambulate and tolerate PO. She will follow up in 1 week for suture removal. Return precautions discussed including worsening headache, confusion, vomiting, or any other concerns.     Ezequiel Essex, MD 07/23/13 [facility] Emerald

## 2013-07-23 NOTE — ED Notes (Signed)
Pt states that she fell on ice in parking lot. Laceration to the posterior head.  C/o tailbone pain.  Pt states that she did not lose consciousness.

## 2013-07-23 NOTE — ED Notes (Signed)
Spoke with Mikki Santee, pts supervisor.  Denies need for UDS or [employer] comp paperwork.  He provided Samuella Cota phone number [phone] is his boss.  Attempted to contact [place] without success.

## 2013-07-26 ENCOUNTER — Encounter: Payer: Medicare Other | Admitting: Gynecology

## 2013-07-26 ENCOUNTER — Telehealth: Payer: Self-pay | Admitting: *Deleted

## 2013-07-26 NOTE — Telephone Encounter (Signed)
Spoke with patient who stated this accident happened at work and is now being handled as a Ecologist. I advised the patient to call them and make sure it was okay for he to be seen in our office. Patient stated she would do that and call us back if sh could be seen here.

## 2013-07-26 NOTE — Telephone Encounter (Addendum)
Patient called and stated that she fell Friday at her job and suffered a head injury. Patient was seen at [facility] on 0220/2015. She states that she is still feeling bad and wanted to discuss it with Katharine Look or Maudie Mercury. Please advise.

## 2013-08-03 ENCOUNTER — Ambulatory Visit: Payer: Medicare Other

## 2013-08-04 ENCOUNTER — Ambulatory Visit (INDEPENDENT_AMBULATORY_CARE_PROVIDER_SITE_OTHER): Payer: Medicare Other

## 2013-08-04 DIAGNOSIS — E538 Deficiency of other specified B group vitamins: Secondary | ICD-10-CM | POA: Diagnosis not present

## 2013-08-04 MED ORDER — CYANOCOBALAMIN 1000 MCG/ML IJ SOLN
1000.0000 ug | Freq: Once | INTRAMUSCULAR | Status: AC
  Administered 2013-08-04: 1000 ug via INTRAMUSCULAR

## 2013-08-04 NOTE — Progress Notes (Signed)
Patient ID: [name], female   DOB: 1945/08/28, 68 y.o.   MRN: [id number] Pre visit review using our clinic review tool, if applicable. No additional management support is needed unless otherwise documented below in the visit note.

## 2013-08-16 DIAGNOSIS — M545 Low back pain, unspecified: Secondary | ICD-10-CM | POA: Diagnosis not present

## 2013-08-16 DIAGNOSIS — IMO0002 Reserved for concepts with insufficient information to code with codable children: Secondary | ICD-10-CM | POA: Diagnosis not present

## 2013-08-16 DIAGNOSIS — M5137 Other intervertebral disc degeneration, lumbosacral region: Secondary | ICD-10-CM | POA: Diagnosis not present

## 2013-08-17 DIAGNOSIS — K219 Gastro-esophageal reflux disease without esophagitis: Secondary | ICD-10-CM | POA: Diagnosis not present

## 2013-08-17 DIAGNOSIS — Z86010 Personal history of colonic polyps: Secondary | ICD-10-CM | POA: Diagnosis not present

## 2013-08-17 DIAGNOSIS — R141 Gas pain: Secondary | ICD-10-CM | POA: Diagnosis not present

## 2013-08-17 DIAGNOSIS — K589 Irritable bowel syndrome without diarrhea: Secondary | ICD-10-CM | POA: Diagnosis not present

## 2013-08-23 DIAGNOSIS — Z8279 Family history of other congenital malformations, deformations and chromosomal abnormalities: Secondary | ICD-10-CM | POA: Diagnosis not present

## 2013-08-23 DIAGNOSIS — IMO0002 Reserved for concepts with insufficient information to code with codable children: Secondary | ICD-10-CM | POA: Diagnosis not present

## 2013-08-23 DIAGNOSIS — Q851 Tuberous sclerosis: Secondary | ICD-10-CM | POA: Diagnosis not present

## 2013-08-23 DIAGNOSIS — Z87891 Personal history of nicotine dependence: Secondary | ICD-10-CM | POA: Diagnosis not present

## 2013-08-23 DIAGNOSIS — Z881 Allergy status to other antibiotic agents status: Secondary | ICD-10-CM | POA: Diagnosis not present

## 2013-08-30 ENCOUNTER — Telehealth: Payer: Self-pay

## 2013-08-30 NOTE — Telephone Encounter (Signed)
Patient walked into the office requesting a work note for 07/16/13 and 08/20/13 she called out of work and did not come in to been seen for her illness on either day. She is wanting a work not for both days to save her job. Per Dr.Lowne the patient will need to be seen. Apt has been scheduled for tomorrow at 3:15.     KP

## 2013-08-31 ENCOUNTER — Ambulatory Visit (INDEPENDENT_AMBULATORY_CARE_PROVIDER_SITE_OTHER): Payer: Medicare Other | Admitting: Family Medicine

## 2013-08-31 ENCOUNTER — Encounter: Payer: Self-pay | Admitting: Family Medicine

## 2013-08-31 VITALS — BP 120/76 | HR 89 | Temp 98.5°F | Wt 206.0 lb

## 2013-08-31 DIAGNOSIS — J069 Acute upper respiratory infection, unspecified: Secondary | ICD-10-CM | POA: Diagnosis not present

## 2013-08-31 DIAGNOSIS — E669 Obesity, unspecified: Secondary | ICD-10-CM | POA: Diagnosis not present

## 2013-08-31 DIAGNOSIS — E538 Deficiency of other specified B group vitamins: Secondary | ICD-10-CM

## 2013-08-31 MED ORDER — CYANOCOBALAMIN 1000 MCG/ML IJ SOLN
1000.0000 ug | Freq: Once | INTRAMUSCULAR | Status: AC
  Administered 2013-08-31: 1000 ug via INTRAMUSCULAR

## 2013-08-31 MED ORDER — NALTREXONE-BUPROPION HCL ER 8-90 MG PO TB12: ORAL_TABLET | ORAL | Status: DC

## 2013-08-31 NOTE — Patient Instructions (Signed)
Obesity Obesity is defined as having too much total body fat and a body mass index (BMI) of 30 or more. BMI is an estimate of body fat and is calculated from your height and weight. Obesity happens when you consume more calories than you can burn by exercising or performing daily physical tasks. Prolonged obesity can cause major illnesses or emergencies, such as:   A stroke.  Heart disease.  Diabetes.  Cancer.  Arthritis.  High blood pressure (hypertension).  High cholesterol.  Sleep apnea.  Erectile dysfunction.  Infertility problems. CAUSES   Regularly eating unhealthy foods.  Physical inactivity.  Certain disorders, such as an underactive thyroid (hypothyroidism), Cushing's syndrome, and polycystic ovarian syndrome.  Certain medicines, such as steroids, some depression medicines, and antipsychotics.  Genetics.  Lack of sleep. DIAGNOSIS  A caregiver can diagnose obesity after calculating your BMI. Obesity will be diagnosed if your BMI is 30 or higher.  There are other methods of measuring obesity levels. Some other methods include measuring your skin fold thickness, your waist circumference, and comparing your hip circumference to your waist circumference. TREATMENT  A healthy treatment program includes some or all of the following:  Long-term dietary changes.  Exercise and physical activity.  Behavioral and lifestyle changes.  Medicine only under the supervision of your caregiver. Medicines may help, but only if they are used with diet and exercise programs. An unhealthy treatment program includes:  Fasting.  Fad diets.  Supplements and drugs. These choices do not succeed in long-term weight control.  HOME CARE INSTRUCTIONS   Exercise and perform physical activity as directed by your caregiver. To increase physical activity, try the following:  Use stairs instead of elevators.  Park farther away from store entrances.  Garden, bike, or walk instead of  watching television or using the computer.  Eat healthy, low-calorie foods and drinks on a regular basis. Eat more fruits and vegetables. Use low-calorie cookbooks or take healthy cooking classes.  Limit fast food, sweets, and processed snack foods.  Eat smaller portions.  Keep a daily journal of everything you eat. There are many free websites to help you with this. It may be helpful to measure your foods so you can determine if you are eating the correct portion sizes.  Avoid drinking alcohol. Drink more water and drinks without calories.  Take vitamins and supplements only as recommended by your caregiver.  Weight-loss support groups, Registered Dieticians, counselors, and stress reduction education can also be very helpful. SEEK IMMEDIATE MEDICAL CARE IF:  You have chest pain or tightness.  You have trouble breathing or feel short of breath.  You have weakness or leg numbness.  You feel confused or have trouble talking.  You have sudden changes in your vision. MAKE SURE YOU:  Understand these instructions.  Will watch your condition.  Will get help right away if you are not doing well or get worse. Document Released: 06/27/2004 Document Revised: 11/19/2011 Document Reviewed: 06/26/2011 [employer] Patient Information 2014 [employer].  

## 2013-08-31 NOTE — Progress Notes (Signed)
Pre visit review using our clinic review tool, if applicable. No additional management support is needed unless otherwise documented below in the visit note. 

## 2013-08-31 NOTE — Progress Notes (Signed)
Patient ID: [name], female   DOB: [birth year], 68 y.o.   MRN: [id number]   Subjective:    Patient ID: [name], female    DOB: 02-Apr-1946, 68 y.o.   MRN: [id number] HPI Pt here for b12 shot and to discuss weight loss.  She is trying to walk when the weather is nice. She also c/o uri for last several days.   No other complaints.  No fever, cp or sob.         Objective:    BP 120/76  Pulse 89  Temp(Src) 98.5 F (36.9 C) (Oral)  Wt 206 lb (93.441 kg)  SpO2 97% General appearance: alert, cooperative and appears stated age Ears: normal TM's and external ear canals both ears Nose: Nares normal. Septum midline. Mucosa normal. No drainage or sinus tenderness. Throat: lips, mucosa, and tongue normal; teeth and gums normal Neck: no adenopathy, supple, symmetrical, trachea midline and thyroid not enlarged, symmetric, no tenderness/mass/nodules Lungs: clear to auscultation bilaterally Heart: S1, S2 normal         Assessment &2 Plan:  1. Obesity, unspecified  contrave per orders Diet and exercise  2. Acute upper respiratory infections of unspecified site Antihistamine and steroid nasal spray rto prn or call  3. B12 deficiency Due for labs - cyanocobalamin ((VITAMIN B-12)) injection 1,000 mcg; Inject 1 mL (1,000 mcg total) into the muscle once.

## 2013-09-06 ENCOUNTER — Ambulatory Visit: Payer: Self-pay

## 2013-09-14 ENCOUNTER — Ambulatory Visit (INDEPENDENT_AMBULATORY_CARE_PROVIDER_SITE_OTHER): Payer: Medicare Other | Admitting: Family Medicine

## 2013-09-14 ENCOUNTER — Encounter: Payer: Self-pay | Admitting: Family Medicine

## 2013-09-14 VITALS — BP 126/72 | HR 86 | Temp 97.8°F | Wt 201.0 lb

## 2013-09-14 DIAGNOSIS — K5900 Constipation, unspecified: Secondary | ICD-10-CM

## 2013-09-14 DIAGNOSIS — R109 Unspecified abdominal pain: Secondary | ICD-10-CM

## 2013-09-14 DIAGNOSIS — R300 Dysuria: Secondary | ICD-10-CM | POA: Diagnosis not present

## 2013-09-14 DIAGNOSIS — Q851 Tuberous sclerosis: Secondary | ICD-10-CM | POA: Diagnosis not present

## 2013-09-14 LAB — CBC WITH DIFFERENTIAL/PLATELET
BASOS PCT: 0.6 % (ref 0.0–3.0)
Basophils Absolute: 0 10*3/uL (ref 0.0–0.1)
Eosinophils Absolute: 0.1 10*3/uL (ref 0.0–0.7)
Eosinophils Relative: 0.9 % (ref 0.0–5.0)
HCT: 43 % (ref 36.0–46.0)
Hemoglobin: 14.8 g/dL (ref 12.0–15.0)
Lymphocytes Relative: 14.5 % (ref 12.0–46.0)
Lymphs Abs: 1 10*3/uL (ref 0.7–4.0)
MCHC: 34.5 g/dL (ref 30.0–36.0)
MCV: 90.3 fl (ref 78.0–100.0)
Monocytes Absolute: 0.5 10*3/uL (ref 0.1–1.0)
Monocytes Relative: 7.3 % (ref 3.0–12.0)
NEUTROS ABS: 5.2 10*3/uL (ref 1.4–7.7)
Neutrophils Relative %: 76.7 % (ref 43.0–77.0)
Platelets: 197 10*3/uL (ref 150.0–400.0)
RBC: 4.76 Mil/uL (ref 3.87–5.11)
RDW: 13.9 % (ref 11.5–14.6)
WBC: 6.8 10*3/uL (ref 4.5–10.5)

## 2013-09-14 LAB — BASIC METABOLIC PANEL
BUN: 14 mg/dL (ref 6–23)
CHLORIDE: 100 meq/L (ref 96–112)
CO2: 22 meq/L (ref 19–32)
Calcium: 9.5 mg/dL (ref 8.4–10.5)
Creatinine, Ser: 0.7 mg/dL (ref 0.4–1.2)
GFR: 96.89 mL/min (ref >60.00)
Glucose, Bld: 84 mg/dL (ref 70–99)
Potassium: 3.5 mEq/L (ref 3.5–5.1)
SODIUM: 134 meq/L — AB (ref 135–145)

## 2013-09-14 LAB — POCT URINALYSIS DIPSTICK
Bilirubin, UA: NEGATIVE
Glucose, UA: NEGATIVE
Ketones, UA: NEGATIVE
Nitrite, UA: NEGATIVE
PH UA: 6
PROTEIN UA: NEGATIVE
SPEC GRAV UA: 1.01
Urobilinogen, UA: 0.2

## 2013-09-14 LAB — TSH: TSH: 1.91 u[IU]/mL (ref 0.35–5.50)

## 2013-09-14 LAB — HEPATIC FUNCTION PANEL
ALBUMIN: 3.7 g/dL (ref 3.5–5.2)
ALT: 29 U/L (ref 0–35)
AST: 18 U/L (ref 0–37)
Alkaline Phosphatase: 90 U/L (ref 39–117)
Bilirubin, Direct: 0.1 mg/dL (ref 0.0–0.3)
TOTAL PROTEIN: 7.8 g/dL (ref 6.0–8.3)
Total Bilirubin: 0.9 mg/dL (ref 0.3–1.2)

## 2013-09-14 MED ORDER — HYDROCODONE-ACETAMINOPHEN 5-325 MG PO TABS: 1.0000 | ORAL_TABLET | Freq: Four times a day (QID) | ORAL | Status: DC | PRN

## 2013-09-14 MED ORDER — NITROFURANTOIN MONOHYD MACRO 100 MG PO CAPS: 100.0000 mg | ORAL_CAPSULE | Freq: Two times a day (BID) | ORAL | Status: DC

## 2013-09-14 NOTE — Progress Notes (Signed)
Pre visit review using our clinic review tool, if applicable. No additional management support is needed unless otherwise documented below in the visit note. 

## 2013-09-14 NOTE — Progress Notes (Signed)
  Subjective:    [name] is a 68 y.o. female who complains of burning with urination, constipation, frequency, inability to void, pain in the lower abdomen and suprapubic pressure. She has had symptoms for 1 week. Patient also complains of stomach ache. Patient denies back pain, congestion, cough, fever, headache, rhinitis, sorethroat and vaginal discharge. Patient does not have a history of recurrent UTI. Patient does not have a history of pyelonephritis.   The following portions of the patient's history were reviewed and updated as appropriate: allergies, current medications, past family history, past medical history, past social history, past surgical history and problem list.  Review of Systems Pertinent items are noted in HPI.    Objective:    BP 126/72  Pulse 86  Temp(Src) 97.8 F (36.6 C) (Oral)  Wt 201 lb (91.173 kg)  SpO2 98% General appearance: alert, cooperative, appears stated age and no distress Throat: lips, mucosa, and tongue normal; teeth and gums normal Neck: no adenopathy, supple, symmetrical, trachea midline and thyroid not enlarged, symmetric, no tenderness/mass/nodules Lungs: clear to auscultation bilaterally Heart: S1, S2 normal Abdomen: abnormal findings:  moderate tenderness in the lower abdomen  Laboratory:  Urine dipstick: trace for hemoglobin and trace for leukocyte esterase.   Micro exam: not done.    Assessment:    dysuria and low abd pain  ----- uti vs stone   Plan:    Medications: nitrofurantoin. Maintain adequate hydration. Follow up if symptoms not improving, and as needed.  Strain urine Refill pain meds -- prn Consider CT/ [place] if symptoms do not improve

## 2013-09-14 NOTE — Patient Instructions (Signed)
Abdominal Pain, Adult °Many things can cause abdominal pain. Usually, abdominal pain is not caused by a disease and will improve without treatment. It can often be observed and treated at home. Your health care provider will do a physical exam and possibly order blood tests and X-rays to help determine the seriousness of your pain. However, in many cases, more time must pass before a clear cause of the pain can be found. Before that point, your health care provider may not know if you need more testing or further treatment. °HOME CARE INSTRUCTIONS  °Monitor your abdominal pain for any changes. The following actions may help to alleviate any discomfort you are experiencing: °· Only take over-the-counter or prescription medicines as directed by your health care provider. °· Do not take laxatives unless directed to do so by your health care provider. °· Try a clear liquid diet (broth, tea, or water) as directed by your health care provider. Slowly move to a bland diet as tolerated. °SEEK MEDICAL CARE IF: °· You have unexplained abdominal pain. °· You have abdominal pain associated with nausea or diarrhea. °· You have pain when you urinate or have a bowel movement. °· You experience abdominal pain that wakes you in the night. °· You have abdominal pain that is worsened or improved by eating food. °· You have abdominal pain that is worsened with eating fatty foods. °SEEK IMMEDIATE MEDICAL CARE IF:  °· Your pain does not go away within 2 hours. °· You have a fever. °· You keep throwing up (vomiting). °· Your pain is felt only in portions of the abdomen, such as the right side or the left lower portion of the abdomen. °· You pass bloody or black tarry stools. °MAKE SURE YOU: °· Understand these instructions.   °· Will watch your condition.   °· Will get help right away if you are not doing well or get worse.   °Document Released: 02/27/2005 Document Revised: 03/10/2013 Document Reviewed: 01/27/2013 °[employer]® Patient  Information ©2014 [employer]. ° °

## 2013-09-16 LAB — URINE CULTURE: Colony Count: 75000

## 2013-09-20 ENCOUNTER — Other Ambulatory Visit: Payer: Self-pay

## 2013-09-20 DIAGNOSIS — Z1231 Encounter for screening mammogram for malignant neoplasm of breast: Secondary | ICD-10-CM

## 2013-09-20 DIAGNOSIS — IMO0002 Reserved for concepts with insufficient information to code with codable children: Secondary | ICD-10-CM | POA: Diagnosis not present

## 2013-09-20 DIAGNOSIS — Q851 Tuberous sclerosis: Secondary | ICD-10-CM | POA: Diagnosis not present

## 2013-09-21 ENCOUNTER — Telehealth: Payer: Self-pay | Admitting: *Deleted

## 2013-09-21 ENCOUNTER — Encounter: Payer: Self-pay | Admitting: Family Medicine

## 2013-09-21 ENCOUNTER — Encounter: Payer: Self-pay | Admitting: Gynecology

## 2013-09-21 ENCOUNTER — Other Ambulatory Visit (HOSPITAL_COMMUNITY)
Admission: RE | Admit: 2013-09-21 | Discharge: 2013-09-21 | Disposition: A | Discharge status: Home / Self Care | Payer: Medicare Other | Source: Ambulatory Visit | Attending: Gynecology | Admitting: Gynecology

## 2013-09-21 ENCOUNTER — Ambulatory Visit (INDEPENDENT_AMBULATORY_CARE_PROVIDER_SITE_OTHER): Payer: Medicare Other | Admitting: Gynecology

## 2013-09-21 VITALS — BP 128/80 | Ht 61.5 in | Wt 208.0 lb

## 2013-09-21 DIAGNOSIS — N200 Calculus of kidney: Secondary | ICD-10-CM | POA: Insufficient documentation

## 2013-09-21 DIAGNOSIS — Z87412 Personal history of vulvar dysplasia: Secondary | ICD-10-CM

## 2013-09-21 DIAGNOSIS — M949 Disorder of cartilage, unspecified: Secondary | ICD-10-CM

## 2013-09-21 DIAGNOSIS — M8580 Other specified disorders of bone density and structure, unspecified site: Secondary | ICD-10-CM

## 2013-09-21 DIAGNOSIS — Z87410 Personal history of cervical dysplasia: Secondary | ICD-10-CM

## 2013-09-21 DIAGNOSIS — Z124 Encounter for screening for malignant neoplasm of cervix: Secondary | ICD-10-CM

## 2013-09-21 DIAGNOSIS — M899 Disorder of bone, unspecified: Secondary | ICD-10-CM | POA: Diagnosis not present

## 2013-09-21 DIAGNOSIS — N816 Rectocele: Secondary | ICD-10-CM

## 2013-09-21 DIAGNOSIS — Z1272 Encounter for screening for malignant neoplasm of vagina: Secondary | ICD-10-CM

## 2013-09-21 MED ORDER — VALACYCLOVIR HCL 1 G PO TABS: 1000.0000 mg | ORAL_TABLET | Freq: Three times a day (TID) | ORAL | Status: DC

## 2013-09-21 MED ORDER — ALENDRONATE SODIUM 70 MG PO TABS: 70.0000 mg | ORAL_TABLET | ORAL | Status: DC

## 2013-09-21 NOTE — Telephone Encounter (Signed)
To MD for review     KP 

## 2013-09-21 NOTE — Telephone Encounter (Signed)
Pt stopped by office this afternoon to let you know she passed a kidney stone last Thursday 09/16/2013.  Thank you.  bw

## 2013-09-21 NOTE — Telephone Encounter (Signed)
noted 

## 2013-09-21 NOTE — Patient Instructions (Signed)
Shingles Vaccine What You Need to Know WHAT IS SHINGLES?  Shingles is a painful skin rash, often with blisters. It is also called Herpes Zoster or just Zoster.  A shingles rash usually appears on one side of the face or body and lasts from 2 to 4 weeks. Its main symptom is pain, which can be quite severe. Other symptoms of shingles can include fever, headache, chills, and upset stomach. Very rarely, a shingles infection can lead to pneumonia, hearing problems, blindness, brain inflammation (encephalitis), or death.  For about 1 person in 5, severe pain can continue even after the rash clears up. This is called post-herpetic neuralgia.  Shingles is caused by the Varicella Zoster virus. This is the same virus that causes chickenpox. Only someone who has had a case of chickenpox or rarely, has gotten chickenpox vaccine, can get shingles. The virus stays in your body. It can reappear many years later to cause a case of shingles.  You cannot catch shingles from another person with shingles. However, a person who has never had chickenpox (or chickenpox vaccine) could get chickenpox from someone with shingles. This is not very common.  Shingles is far more common in people 50 and older than in younger people. It is also more common in people whose immune systems are weakened because of a disease such as cancer or drugs such as steroids or chemotherapy.  At least 1 million people get shingles per year in the [place]. SHINGLES VACCINE  A vaccine for shingles was licensed in 2006. In clinical trials, the vaccine reduced the risk of shingles by 50%. It can also reduce the pain in people who still get shingles after being vaccinated.  A single dose of shingles vaccine is recommended for adults 60 years of age and older. SOME PEOPLE SHOULD NOT GET SHINGLES VACCINE OR SHOULD WAIT A person should not get shingles vaccine if he or she:  Has ever had a life-threatening allergic reaction to gelatin, the  antibiotic neomycin, or any other component of shingles vaccine. Tell your caregiver if you have any severe allergies.  Has a weakened immune system because of current:  AIDS or another disease that affects the immune system.  Treatment with drugs that affect the immune system, such as prolonged use of high-dose steroids.  Cancer treatment, such as radiation or chemotherapy.  Cancer affecting the bone marrow or lymphatic system, such as leukemia or lymphoma.  Is pregnant, or might be pregnant. Women should not become pregnant until at least 4 weeks after getting shingles vaccine. Someone with a minor illness, such as a cold, may be vaccinated. Anyone with a moderate or severe acute illness should usually wait until he or she recovers before getting the vaccine. This includes anyone with a temperature of 101.3 F (38 C) or higher. WHAT ARE THE RISKS FROM SHINGLES VACCINE?  A vaccine, like any medicine, could possibly cause serious problems, such as severe allergic reactions. However, the risk of a vaccine causing serious harm, or death, is extremely small.  No serious problems have been identified with shingles vaccine. Mild Problems  Redness, soreness, swelling, or itching at the site of the injection (about 1 person in 3).  Headache (about 1 person in 70). Like all vaccines, shingles vaccine is being closely monitored for unusual or severe problems. WHAT IF THERE IS A MODERATE OR SEVERE REACTION? What should I look for? Any unusual condition, such as a severe allergic reaction or a high fever. If a severe allergic reaction   occurred, it would be within a few minutes to an hour after the shot. Signs of a serious allergic reaction can include difficulty breathing, weakness, hoarseness or wheezing, a fast heartbeat, hives, dizziness, paleness, or swelling of the throat. What should I do?  Call your caregiver, or get the person to a caregiver right away.  Tell the caregiver what  happened, the date and time it happened, and when the vaccination was given.  Ask the caregiver to report the reaction by filing a Vaccine Adverse Event Reporting System (VAERS) form. Or, you can file this report through the VAERS web site at www.vaers.SamedayNews.es or by calling 734-692-4620. VAERS does not provide medical advice. HOW CAN I LEARN MORE?  Ask your caregiver. He or she can give you the vaccine package insert or suggest other sources of information.  Contact the Centers for Disease Control and Prevention (CDC):  Call (707)436-0239 (1-800-CDC-INFO).  Visit the CDC website at http://hunter.com/ CDC Shingles Vaccine VIS (03/08/08) Document Released: 03/17/2006 Document Revised: 08/12/2011 Document Reviewed: 09/09/2012 [facility] Patient Information 2014 [place]. Alendronate weekly tablets What is this medicine? ALENDRONATE (a LEN droe nate) slows calcium loss from bones. It helps to make healthy bone and to slow bone loss in people with osteoporosis. It may be used to treat Paget's disease. This medicine may be used for other purposes; ask your health care provider or pharmacist if you have questions. COMMON BRAND NAME(S): Fosamax What should I tell my health care provider before I take this medicine? They need to know if you have any of these conditions: -esophagus, stomach, or intestine problems, like acid-reflux or GERD -dental disease -kidney disease -low blood calcium -low vitamin D -problems swallowing -problems sitting or standing for 30 minutes -an unusual or allergic reaction to alendronate, other medicines, foods, dyes, or preservatives -pregnant or trying to get pregnant -breast-feeding How should I use this medicine? You must take this medicine exactly as directed or you will lower the amount of medicine you absorb into your body or you may cause yourself harm. Take your dose by mouth first thing in the morning, after you are up for the day. Do not eat or  drink anything before you take this medicine. Swallow your medicine with a full glass (6 to 8 fluid ounces) of plain water. Do not take this tablet with any other drink. Do not chew or crush the tablet. After taking this medicine, do not eat breakfast, drink, or take any medicines or vitamins for at least 30 minutes. Stand or sit up for at least 30 minutes after you take this medicine; do not lie down. Take this medicine on the same day every week. Do not take your medicine more often than directed. Talk to your pediatrician regarding the use of this medicine in children. Special care may be needed. Overdosage: If you think you have taken too much of this medicine contact a poison control center or emergency room at once. NOTE: This medicine is only for you. Do not share this medicine with others. What if I miss a dose? If you miss a dose, take the dose on the morning after you remember. Then take your next dose on your regular day of the week. Never take 2 tablets on the same day. Do not take double or extra doses. What may interact with this medicine? -aluminum hydroxide -antacids -aspirin -calcium supplements -drugs for inflammation like ibuprofen, naproxen, and others -iron supplements -magnesium supplements -vitamins with minerals This list may not describe all possible  interactions. Give your health care provider a list of all the medicines, herbs, non-prescription drugs, or dietary supplements you use. Also tell them if you smoke, drink alcohol, or use illegal drugs. Some items may interact with your medicine. What should I watch for while using this medicine? Visit your doctor or health care professional for regular checks ups. It may be some time before you see benefit from this medicine. Do not stop taking your medication except on your doctor's advice. Your doctor or health care professional may order blood tests and other tests to see how you are doing. You should make sure you get  enough calcium and vitamin D while you are taking this medicine, unless your doctor tells you not to. Discuss the foods you eat and the vitamins you take with your health care professional. Some people who take this medicine have severe bone, joint, and/or muscle pain. This medicine may also increase your risk for a broken thigh bone. Tell your doctor right away if you have pain in your upper leg or groin. Tell your doctor if you have any pain that does not go away or that gets worse. This medicine can make you more sensitive to the sun. If you get a rash while taking this medicine, sunlight may cause the rash to get worse. Keep out of the sun. If you cannot avoid being in the sun, wear protective clothing and use sunscreen. Do not use sun lamps or tanning beds/booths. What side effects may I notice from receiving this medicine? Side effects that you should report to your doctor or health care professional as soon as possible: -allergic reactions like skin rash, itching or hives, swelling of the face, lips, or tongue -black or tarry stools -bone, muscle or joint pain -changes in vision -chest pain -heartburn or stomach pain -jaw pain, especially after dental work -pain or trouble when swallowing -redness, blistering, peeling or loosening of the skin, including inside the mouth Side effects that usually do not require medical attention (report to your doctor or health care professional if they continue or are bothersome): -changes in taste -diarrhea or constipation -eye pain or itching -headache -nausea or vomiting -stomach gas or fullness This list may not describe all possible side effects. Call your doctor for medical advice about side effects. You may report side effects to FDA at 1-800-FDA-1088. Where should I keep my medicine? Keep out of the reach of children. Store at room temperature of 15 and 30 degrees C (59 and 86 degrees F). Throw away any unused medicine after the expiration  date. NOTE: This sheet is a summary. It may not cover all possible information. If you have questions about this medicine, talk to your doctor, pharmacist, or health care provider.  2014, Elsevier/Gold Standard. (2011-04-04 09:02:42)

## 2013-09-21 NOTE — Progress Notes (Signed)
[place] 19-Dec-1945 [id number]   History:    68 y.o.  for annual gyn exam with a complaint of weight gain and memory loss. Her PCP has made arrangements for her to see a neurologist for further evaluation. Patient with history of transvaginal hysterectomy in 1975 for benign entity. 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 Her mother was also diagnosed and died with tuberous sclerosis and is being followed at [facility] in [facility].  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 Colposcopic directed biopsy demonstrated: Diagnosis 1. Vagina, biopsy, cuff - INFLAMMATION AND SLIGHT SQUAMOUS ATYPIA. - SEE MICROSCOPIC DESCRIPTION 2. Labium, biopsy, left, crease between majora/minora - MILD SQUAMOUS HYPERPLASIA AND HYPERKERATOSIS. - SEE MICROSCOPIC DESCRIPTION 3. Labium, biopsy, right majora medial - MILD SQUAMOUS HYPERPLASIA AND HYPERKERATOSIS. - SEE MICROSCOPIC DESCRIPTION 4. Labium, biopsy, left majora lateral - MILD SQUAMOUS HYPERPLASIA AND HYPERKERATOSIS. - SEE MICROSCOPIC DESCRIPTION 5. Skin , right groin  Microscopic Comment 1. There is squamous mucosa with inflammation and associated squamous atypia including rare koilocytes. Much of the squamous atypia has features consistent with inflammatory and reactive atypia. Multiple additional levels are examined and the findings are similar. A p16 (HPV) stain is performed and there are rare cells positive in the superficial epithelium which may represent focal human papillomavirus cytopathic effect. No  fungi are identified with PAS stain. 2. -5. There is slight, variable squamous hyperplasia associated with hyperkeratosis and there are occasional foci with koilocytic change. 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 There are no dysplastic changes and there is no evidence of malignancy. - SQUAMOUS HYPERPLASIA AND HYPERKERATOSIS. - SEE MICROSCOPIC DESCRIPTION  The patient subsequently underwent CO2 laser ablation of perirectal, left labia majora and Orien vaginal cuff and right groin hyperplastic lesions as well as the condyloma acuminatum.  She was also diagnosed as being osteopenic based on bone density study done in 08/11/2012 whereby her lowest T. Score was at the right femoral neck with a value of -2.3. Frax analysis demonstrated that her ten-year fracture risk exceeded the threshold. Her ten-year fracture risk of the hip is 3.3% and her overall osteoporotic risk was 18%. We discussed the recommendation that in addition to calcium and vitamin D that she needs to be placed on some form of anti-resorptive agent. We discussed starting her on Fosamax 70 mg q. Weekly. The risks benefits and pros and cons of the medication were discussed to include potential risk of osteonecrosis of the jaw or spontaneous subtrochanteric fracture of the hip. Patient fully understands and accepts. She has informed me that she has not picked up the prescription yet so prescription refill will be provided as well. She was also given a prescription of vaginal estrogen to apply twice a week and she did not start that either. She is here for her annual exam and Pap smear as well.  Past medical history,surgical history, family history and social history were all reviewed and documented in the EPIC chart.  Gynecologic History No LMP recorded. Patient has had a hysterectomy. Contraception: status post hysterectomy Last Pap:  See  above. Results were: See above Last mammogram: 2014. Results were: normal  Obstetric History OB History  No data available     ROS: A ROS was performed and pertinent positives and negatives are included in the history.  GENERAL: No fevers or chills. HEENT: No change in vision, no earache, sore throat or sinus congestion. NECK: No pain or stiffness. CARDIOVASCULAR: No chest pain or pressure. No palpitations. PULMONARY: No shortness of breath, cough or wheeze. GASTROINTESTINAL: No abdominal pain, nausea, vomiting or diarrhea, melena or bright red blood per rectum. GENITOURINARY: No urinary frequency, urgency, hesitancy or dysuria. MUSCULOSKELETAL: No joint or muscle pain, no back pain, no recent trauma. DERMATOLOGIC: No rash, no itching, no lesions. ENDOCRINE: No polyuria, polydipsia, no heat or cold intolerance. No recent change in weight. HEMATOLOGICAL: No anemia or easy bruising or bleeding. NEUROLOGIC: No headache, seizures, numbness, tingling or weakness. PSYCHIATRIC: No depression, no loss of interest in normal activity or change in sleep pattern.     Exam: chaperone present  BP 128/80  Ht 5' 1.5" (1.562 m)  Wt 208 lb (94.348 kg)  BMI 38.67 kg/m2  Body mass index is 38.67 kg/(m^2).  General appearance : Well developed well nourished female. No acute distress HEENT: Neck supple, trachea midline, no carotid bruits, no thyroidmegaly Lungs: Clear to auscultation, no rhonchi or wheezes, or rib retractions  Heart: Regular rate and rhythm, no murmurs or gallops Breast:Examined in sitting and supine position were symmetrical in appearance, no palpable masses or tenderness,  no skin retraction, no nipple inversion, no nipple discharge, no skin discoloration, no axillary or supraclavicular lymphadenopathy Abdomen: no palpable masses or tenderness, no rebound or guarding Extremities: no edema or skin discoloration or tenderness  Pelvic:  Bartholin, Urethra, Skene Glands: Within normal  limits             Vagina: No gross lesions or discharge, vaginal atrophy, first-degree rectocele  Cervix: Absent  Uterus  absent  Adnexa  Without masses or tenderness  Anus and perineum  normal   Rectovaginal  normal sphincter tone without palpated masses or tenderness             Hemoccult PCP provides     Assessment/Plan:  68 y.o. female with history of osteopenia but high-risk for fracture based on FRAX analysis did not start her Fosamax 70 mg q. weekly as was prescribed last year. Prescription was provided once again this year. Patient was reminded to apply the vaginal cream twice a week for vaginal atrophy. Pap smear was done today. See above history of present illness for past history of dysplasia. Her mammogram scheduled for the next few weeks. She was reminded of the importance of calcium and vitamin D in regular exercise for osteoporosis prevention. PCP has been doing her blood work. She will not need a bone density study until next year. Will monitor response to her Fosamax once she starts. She was given a requisition for her shingles vaccine. She was reminded of the importance of monthly breast exams. Magnification lens over the external genitalia and did not demonstrate any lesions perirectally or perineum Suzan Slick the area of the fourchette where she had wide local excision in the past for VIN.  Note: This dictation was prepared with  Dragon/digital dictation along withSmart phrase technology. Any transcriptional errors that result from this process are unintentional.   Terrance Mass MD, 12:01 PM 09/21/2013

## 2013-09-27 ENCOUNTER — Ambulatory Visit
Admission: RE | Admit: 2013-09-27 | Discharge: 2013-09-27 | Disposition: A | Discharge status: Home / Self Care | Payer: Medicare Other | Source: Ambulatory Visit

## 2013-09-27 ENCOUNTER — Other Ambulatory Visit: Payer: Self-pay

## 2013-09-27 DIAGNOSIS — Z1231 Encounter for screening mammogram for malignant neoplasm of breast: Secondary | ICD-10-CM | POA: Diagnosis not present

## 2013-09-27 LAB — HM MAMMOGRAPHY

## 2013-10-05 ENCOUNTER — Ambulatory Visit: Payer: Medicare Other

## 2013-10-12 ENCOUNTER — Ambulatory Visit (INDEPENDENT_AMBULATORY_CARE_PROVIDER_SITE_OTHER): Payer: Medicare Other | Admitting: *Deleted

## 2013-10-12 ENCOUNTER — Telehealth: Payer: Self-pay | Admitting: Family Medicine

## 2013-10-12 DIAGNOSIS — E538 Deficiency of other specified B group vitamins: Secondary | ICD-10-CM | POA: Diagnosis not present

## 2013-10-12 MED ORDER — CYANOCOBALAMIN 1000 MCG/ML IJ SOLN
1000.0000 ug | Freq: Once | INTRAMUSCULAR | Status: AC
  Administered 2013-10-12: 1000 ug via INTRAMUSCULAR

## 2013-10-12 NOTE — Telephone Encounter (Signed)
MSG left to call the office      KP 

## 2013-10-12 NOTE — Telephone Encounter (Signed)
Caller name:Hatley Domenico Relation to QQ:[id number] Call back number: [phone] Pharmacy:  Reason for call: Patient came in stating that she is having problems with her bladder not being able to void nor have a bowel movement.  Patient states that this has been going on since Sunday and that she probably should have went to the ER. Patient did not have time to stay for me to get a triage nurse but insisted for Kim to call her.  Also patient stated that she was on Nitrofurantoin but its not working for her..   Please advise.

## 2013-10-13 NOTE — Telephone Encounter (Signed)
Spoke with patient and [name] stated [name] could not void but [name] drank a beer and has been able to go, [name] also took a laxative on Monday and was able to make a BM on Tuesday. [name] said [name] wants to know if her bladder was dropping, I made her aware we would need to be evaluated by Dr.Lowne before we can say, [name] is scheduled for Friday at 4.    KP

## 2013-10-15 ENCOUNTER — Ambulatory Visit (INDEPENDENT_AMBULATORY_CARE_PROVIDER_SITE_OTHER): Payer: Medicare Other | Admitting: Family Medicine

## 2013-10-15 ENCOUNTER — Encounter: Payer: Self-pay | Admitting: Family Medicine

## 2013-10-15 VITALS — BP 120/70 | HR 128 | Temp 98.9°F | Wt 206.4 lb

## 2013-10-15 DIAGNOSIS — R109 Unspecified abdominal pain: Secondary | ICD-10-CM

## 2013-10-15 DIAGNOSIS — R3989 Other symptoms and signs involving the genitourinary system: Secondary | ICD-10-CM | POA: Diagnosis not present

## 2013-10-15 DIAGNOSIS — R39198 Other difficulties with micturition: Secondary | ICD-10-CM

## 2013-10-15 LAB — CBC WITH DIFFERENTIAL/PLATELET
Basophils Absolute: 0 10*3/uL (ref 0.0–0.1)
Basophils Relative: 0 % (ref 0–1)
EOS PCT: 1 % (ref 0–5)
Eosinophils Absolute: 0.1 10*3/uL (ref 0.0–0.7)
HCT: 39 % (ref 36.0–46.0)
Hemoglobin: 13.3 g/dL (ref 12.0–15.0)
LYMPHS PCT: 11 % — AB (ref 12–46)
Lymphs Abs: 1.5 10*3/uL (ref 0.7–4.0)
MCH: 30.2 pg (ref 26.0–34.0)
MCHC: 34.1 g/dL (ref 30.0–36.0)
MCV: 88.6 fL (ref 78.0–100.0)
Monocytes Absolute: 0.9 10*3/uL (ref 0.1–1.0)
Monocytes Relative: 7 % (ref 3–12)
NEUTROS ABS: 10.7 10*3/uL — AB (ref 1.7–7.7)
Neutrophils Relative %: 81 % — ABNORMAL HIGH (ref 43–77)
PLATELETS: 338 10*3/uL (ref 150–400)
RBC: 4.4 MIL/uL (ref 3.87–5.11)
RDW: 13.4 % (ref 11.5–15.5)
WBC: 13.2 10*3/uL — AB (ref 4.0–10.5)

## 2013-10-15 LAB — HEPATIC FUNCTION PANEL
ALT: 23 U/L (ref 0–35)
AST: 16 U/L (ref 0–37)
Albumin: 3.8 g/dL (ref 3.5–5.2)
Alkaline Phosphatase: 125 U/L — ABNORMAL HIGH (ref 39–117)
BILIRUBIN DIRECT: 0.2 mg/dL (ref 0.0–0.3)
BILIRUBIN INDIRECT: 0.6 mg/dL (ref 0.2–1.2)
TOTAL PROTEIN: 6.7 g/dL (ref 6.0–8.3)
Total Bilirubin: 0.8 mg/dL (ref 0.2–1.2)

## 2013-10-15 LAB — POCT URINALYSIS DIPSTICK
Bilirubin, UA: NEGATIVE
Glucose, UA: NEGATIVE
LEUKOCYTES UA: NEGATIVE
Nitrite, UA: NEGATIVE
PROTEIN UA: NEGATIVE
SPEC GRAV UA: 1.01
UROBILINOGEN UA: 0.2
pH, UA: 6

## 2013-10-15 LAB — BASIC METABOLIC PANEL
BUN: 10 mg/dL (ref 6–23)
CALCIUM: 9.3 mg/dL (ref 8.4–10.5)
CO2: 22 mEq/L (ref 19–32)
CREATININE: 0.56 mg/dL (ref 0.50–1.10)
Chloride: 100 mEq/L (ref 96–112)
Glucose, Bld: 98 mg/dL (ref 70–99)
Potassium: 3.5 mEq/L (ref 3.5–5.3)
Sodium: 135 mEq/L (ref 135–145)

## 2013-10-15 NOTE — Progress Notes (Signed)
Pre visit review using our clinic review tool, if applicable. No additional management support is needed unless otherwise documented below in the visit note. 

## 2013-10-15 NOTE — Patient Instructions (Signed)
Abdominal Pain, Adult °Many things can cause abdominal pain. Usually, abdominal pain is not caused by a disease and will improve without treatment. It can often be observed and treated at home. Your health care provider will do a physical exam and possibly order blood tests and X-rays to help determine the seriousness of your pain. However, in many cases, more time must pass before a clear cause of the pain can be found. Before that point, your health care provider may not know if you need more testing or further treatment. °HOME CARE INSTRUCTIONS  °Monitor your abdominal pain for any changes. The following actions may help to alleviate any discomfort you are experiencing: °· Only take over-the-counter or prescription medicines as directed by your health care provider. °· Do not take laxatives unless directed to do so by your health care provider. °· Try a clear liquid diet (broth, tea, or water) as directed by your health care provider. Slowly move to a bland diet as tolerated. °SEEK MEDICAL CARE IF: °· You have unexplained abdominal pain. °· You have abdominal pain associated with nausea or diarrhea. °· You have pain when you urinate or have a bowel movement. °· You experience abdominal pain that wakes you in the night. °· You have abdominal pain that is worsened or improved by eating food. °· You have abdominal pain that is worsened with eating fatty foods. °SEEK IMMEDIATE MEDICAL CARE IF:  °· Your pain does not go away within 2 hours. °· You have a fever. °· You keep throwing up (vomiting). °· Your pain is felt only in portions of the abdomen, such as the right side or the left lower portion of the abdomen. °· You pass bloody or black tarry stools. °MAKE SURE YOU: °· Understand these instructions.   °· Will watch your condition.   °· Will get help right away if you are not doing well or get worse.   °Document Released: 02/27/2005 Document Revised: 03/10/2013 Document Reviewed: 01/27/2013 °[employer]® Patient  Information ©2014 [employer]. ° °

## 2013-10-15 NOTE — Progress Notes (Signed)
Subjective:     [name] is a 68 y.o. female who presents for evaluation of abdominal pain. Onset was 6 days ago. Symptoms have been gradually worsening. The pain is described as sharp, and is 8/10 in intensity. Pain is located in the across lower abd  without radiation.  Aggravating factors: none.  Alleviating factors: none. Associated symptoms: chills, constipation and nausea. The patient denies anorexia, arthralagias, belching, diarrhea, dysuria, fever, flatus, frequency, headache, hematochezia, hematuria, melena, myalgias and vomiting.  Past Medical History  Diagnosis Date  . Arthritis   . Diverticulosis   . GERD (gastroesophageal reflux disease)   . Osteoporosis   . Tuberous sclerosis     EXTERNAL LESIONS--  MAINLY HANDS  . Seasonal allergies   . Vulvar dysplasia   . H/O hiatal hernia   . History of diverticulitis of colon   . Nodule of right lung     BENIGN---  CT  10-28-2008  . History of gastric ulcer   . Shingles     march 2014--  back  . History of epilepsy     childhood age 69 to 25  ---secondary to high calcium deposts of optic nerve---  none since age 65 with pregency  . OSA (obstructive sleep apnea)     mild-- no cpap  . IBS (irritable bowel syndrome)   . Seizure   . History of kidney stones    Patient Active Problem List   Diagnosis Date Noted  . History of cervical dysplasia 09/21/2013  . Rectocele, female 09/21/2013  . Kidney stone 09/21/2013  . Rash and nonspecific skin eruption 06/25/2013  . Obesity (BMI 30-39.9) 06/16/2013  . IBS (irritable bowel syndrome) 12/22/2012  . Other sleep disturbances 10/27/2012  . Elevated BP 09/29/2012  . Weight gain 08/17/2012  . Osteopenia 08/17/2012  . Vulvar lesion 08/10/2012  . VAIN I (vaginal intraepithelial neoplasia grade I) 08/10/2012  . 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 Condyloma acuminatum in female 07/14/2012  . Shingles 07/14/2012  . History of vitamin D deficiency 07/14/2012   Past Surgical  History  Procedure Laterality Date  . Bladder surgery  1991    bladder reconstruction of torsion  . Colonoscopy    . Polypectomy    . Upper gastrointestinal endoscopy    . Cataract extraction w/ intraocular lens  implant, bilateral    . Cholecystectomy  11-18-2003  . Cardiac catheterization  06-26-2001    NORMAL CORONARY ARTERIES  . Orif right bimalleolar ankle fx  12-18-2003  . 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 Lumbar disc surgery  1985  . Dilation and curettage of uterus  multiple -- last one 1975  . Vaginal hysterectomy  1975  . Hemorrhoid surgery  1970's  . Co2 laser application N/A 4/0/3474    Procedure: CO2 LASER APPLICATION;  Surgeon: Terrance Mass, MD;  Location: [facility];  Service: Gynecology;  Laterality: N/A;  CPT 57065  CO2 laser of vaginal and vulvar lesions.  . Back surgery    . Augmentation mammaplasty      SALINE  . Gynecologic cryosurgery     Family History  Problem Relation Age of Onset  . Heart disease Mother   . Tuberous sclerosis Mother   . Tuberous sclerosis Sister   . Tuberous sclerosis Daughter   . Tuberous sclerosis Son   . Tuberous sclerosis Maternal Aunt   . Tuberous sclerosis Maternal Grandmother   . Tuberous  sclerosis Maternal Aunt   . Tuberous sclerosis Maternal Aunt   . Tuberous sclerosis Cousin    History   Social History  . Marital Status: Divorced    Spouse Name: N/A    Number of Children: N/A  . Years of Education: N/A   Occupational History  . driver Other   Social History Main Topics  . Smoking status: Former Smoker -- 2.50 packs/day for 20 years    Types: Cigarettes    Quit date: 08/28/1992  . Smokeless tobacco: Never Used  . Alcohol Use: Yes     Comment: glass of wine occassional  . Drug Use: No  . Sexual Activity: Not Currently    Partners: Male   Other Topics Concern  . None   Social History Narrative   Exercise-no   Current  Outpatient Prescriptions  Medication Sig Dispense Refill  . alendronate (FOSAMAX) 70 MG tablet Take 1 tablet (70 mg total) by mouth once a week. Take with a full glass of water on an empty stomach.  30 tablet  11  . cyanocobalamin (,VITAMIN B-12,) 1000 MCG/ML injection Inject 1 mL (1,000 mcg total) into the muscle once.  1 mL  0  . ergocalciferol (VITAMIN D2) 50000 UNITS capsule Take one tablet every other week for 6 mos.  6 capsule  1  . HYDROcodone-acetaminophen (NORCO/VICODIN) 5-325 MG per tablet Take 1 tablet by mouth every 6 (six) hours as needed. For pain  30 tablet  0  . meloxicam (MOBIC) 15 MG tablet Take 15 mg by mouth daily.      . Naltrexone-Bupropion HCl ER (CONTRAVE) 8-90 MG TB12 1 po qam x1 week, then 1 po bid x 1 week then 2 po qam and 1 po qpm x 1 week then 2 po bid  60 tablet  2  . valACYclovir (VALTREX) 1000 MG tablet Take 1 tablet (1,000 mg total) by mouth 3 (three) times daily.  21 tablet  0  . omeprazole (PRILOSEC) 40 MG capsule Take 40 mg by mouth daily.       No current facility-administered medications for this visit.   Allergies: Ciprofloxacin; Percocet; Zoloft; Contrast media; and Sulfa antibiotics  Review of Systems Pertinent items are noted in HPI.     Objective:    BP 120/70  Pulse 128  Temp(Src) 98.9 F (37.2 C) (Oral)  Wt 206 lb 6.4 oz (93.622 kg)  SpO2 96% General appearance: alert, cooperative, appears stated age and moderate distress Neck: no adenopathy, no carotid bruit, no JVD, supple, symmetrical, trachea midline and thyroid not enlarged, symmetric, no tenderness/mass/nodules Lungs: clear to auscultation bilaterally Heart: S1, S2 normal Abdomen: abnormal findings:  moderate tenderness in the lower abdomen    Assessment:    Abdominal pain,----  ? Constipation but pain is worsening over the week .    Plan:    See orders for lab and imaging studies. Adhere to simple, bland diet. Further follow-up plans will be based on outcome of lab/imaging  studies; see orders. Follow up as needed.

## 2013-10-16 ENCOUNTER — Emergency Department (HOSPITAL_BASED_OUTPATIENT_CLINIC_OR_DEPARTMENT_OTHER)
Admission: EM | Admit: 2013-10-16 | Discharge: 2013-10-16 | Disposition: A | Discharge status: Home / Self Care | Payer: Medicare Other | Attending: Emergency Medicine | Admitting: Emergency Medicine

## 2013-10-16 ENCOUNTER — Encounter (HOSPITAL_BASED_OUTPATIENT_CLINIC_OR_DEPARTMENT_OTHER): Payer: Self-pay | Admitting: Emergency Medicine

## 2013-10-16 ENCOUNTER — Other Ambulatory Visit (HOSPITAL_BASED_OUTPATIENT_CLINIC_OR_DEPARTMENT_OTHER): Payer: Self-pay

## 2013-10-16 DIAGNOSIS — A499 Bacterial infection, unspecified: Secondary | ICD-10-CM | POA: Diagnosis not present

## 2013-10-16 DIAGNOSIS — Z79899 Other long term (current) drug therapy: Secondary | ICD-10-CM | POA: Insufficient documentation

## 2013-10-16 DIAGNOSIS — N390 Urinary tract infection, site not specified: Secondary | ICD-10-CM

## 2013-10-16 DIAGNOSIS — Z87891 Personal history of nicotine dependence: Secondary | ICD-10-CM | POA: Diagnosis not present

## 2013-10-16 DIAGNOSIS — M129 Arthropathy, unspecified: Secondary | ICD-10-CM | POA: Diagnosis not present

## 2013-10-16 DIAGNOSIS — Z8719 Personal history of other diseases of the digestive system: Secondary | ICD-10-CM | POA: Diagnosis not present

## 2013-10-16 DIAGNOSIS — G4733 Obstructive sleep apnea (adult) (pediatric): Secondary | ICD-10-CM | POA: Insufficient documentation

## 2013-10-16 DIAGNOSIS — K219 Gastro-esophageal reflux disease without esophagitis: Secondary | ICD-10-CM | POA: Insufficient documentation

## 2013-10-16 DIAGNOSIS — M810 Age-related osteoporosis without current pathological fracture: Secondary | ICD-10-CM | POA: Insufficient documentation

## 2013-10-16 DIAGNOSIS — B9689 Other specified bacterial agents as the cause of diseases classified elsewhere: Secondary | ICD-10-CM | POA: Diagnosis not present

## 2013-10-16 DIAGNOSIS — Z8669 Personal history of other diseases of the nervous system and sense organs: Secondary | ICD-10-CM | POA: Insufficient documentation

## 2013-10-16 DIAGNOSIS — N760 Acute vaginitis: Secondary | ICD-10-CM

## 2013-10-16 DIAGNOSIS — Z87442 Personal history of urinary calculi: Secondary | ICD-10-CM | POA: Insufficient documentation

## 2013-10-16 LAB — BASIC METABOLIC PANEL
BUN: 14 mg/dL (ref 6–23)
CALCIUM: 9.4 mg/dL (ref 8.4–10.5)
CO2: 25 mEq/L (ref 19–32)
CREATININE: 0.6 mg/dL (ref 0.50–1.10)
Chloride: 96 mEq/L (ref 96–112)
GFR calc Af Amer: >90 mL/min (ref >90)
Glucose, Bld: 108 mg/dL — ABNORMAL HIGH (ref 70–99)
Potassium: 3.1 mEq/L — ABNORMAL LOW (ref 3.7–5.3)
Sodium: 138 mEq/L (ref 137–147)

## 2013-10-16 LAB — URINALYSIS, ROUTINE W REFLEX MICROSCOPIC
Glucose, UA: NEGATIVE mg/dL
Ketones, ur: 15 mg/dL — AB
Nitrite: NEGATIVE
Protein, ur: 100 mg/dL — AB
Specific Gravity, Urine: 1.034 — ABNORMAL HIGH (ref 1.005–1.030)
Urobilinogen, UA: 1 mg/dL (ref 0.0–1.0)
pH: 5.5 (ref 5.0–8.0)

## 2013-10-16 LAB — CBC WITH DIFFERENTIAL/PLATELET
BASOS ABS: 0 10*3/uL (ref 0.0–0.1)
Basophils Relative: 0 % (ref 0–1)
EOS ABS: 0.1 10*3/uL (ref 0.0–0.7)
Eosinophils Relative: 1 % (ref 0–5)
HCT: 36.9 % (ref 36.0–46.0)
Hemoglobin: 12.8 g/dL (ref 12.0–15.0)
LYMPHS ABS: 1.9 10*3/uL (ref 0.7–4.0)
LYMPHS PCT: 13 % (ref 12–46)
MCH: 31.5 pg (ref 26.0–34.0)
MCHC: 34.7 g/dL (ref 30.0–36.0)
MCV: 90.9 fL (ref 78.0–100.0)
Monocytes Absolute: 1.1 10*3/uL — ABNORMAL HIGH (ref 0.1–1.0)
Monocytes Relative: 7 % (ref 3–12)
NEUTROS PCT: 79 % — AB (ref 43–77)
Neutro Abs: 11.5 10*3/uL — ABNORMAL HIGH (ref 1.7–7.7)
Platelets: 282 10*3/uL (ref 150–400)
RBC: 4.06 MIL/uL (ref 3.87–5.11)
RDW: 12.6 % (ref 11.5–15.5)
WBC: 14.6 10*3/uL — AB (ref 4.0–10.5)

## 2013-10-16 LAB — WET PREP, GENITAL
Clue Cells Wet Prep HPF POC: NONE SEEN
TRICH WET PREP: NONE SEEN
YEAST WET PREP: NONE SEEN

## 2013-10-16 LAB — URINE MICROSCOPIC-ADD ON

## 2013-10-16 MED ORDER — CEFTRIAXONE SODIUM 1 G IJ SOLR
1.0000 g | Freq: Once | INTRAMUSCULAR | Status: AC
  Administered 2013-10-16: 1 g via INTRAMUSCULAR
  Filled 2013-10-16: qty 10

## 2013-10-16 MED ORDER — CEPHALEXIN 500 MG PO CAPS
ORAL_CAPSULE | ORAL | Status: DC
Start: 2013-10-16 — End: 2013-11-15

## 2013-10-16 MED ORDER — METRONIDAZOLE 500 MG PO TABS: 500.0000 mg | ORAL_TABLET | Freq: Two times a day (BID) | ORAL | Status: DC

## 2013-10-16 MED ORDER — HYDROCODONE-ACETAMINOPHEN 5-325 MG PO TABS: 2.0000 | ORAL_TABLET | Freq: Four times a day (QID) | ORAL | Status: DC | PRN

## 2013-10-16 NOTE — ED Notes (Signed)
Pt reports difficulty with BM and urinating recently.  Sts that she had BM this morning.  Sts that she has had some discharge starting this afternoon. Denies pain, n/v.

## 2013-10-16 NOTE — ED Notes (Signed)
Patient here with ongoing problems with urinary hesitancy and constipation. Was scheduled to have CT abdomen this am and developed diarrhea so test was postponed. This afternoon developed greenish vaginal discharge and reports previous hysterectomy

## 2013-10-16 NOTE — ED Provider Notes (Signed)
CSN: [id number]     Arrival date & time 10/16/13  1907 History  This chart was scribed for Samantha Relic, MD by Marcha Dutton, ED Scribe. This patient was seen in room MH01/MH01 and the patient's care was started at 8:45 PM.    Chief Complaint  Patient presents with  . Vaginal Discharge    The history is provided by the patient. No language interpreter was used.    HPI Comments: [name] is a 68 y.o. female who presents to the Emergency Department complaining of green vaginal discharge that began tonight. Pt denies burning with urination. She states she's had a week of lower abdominal pain. She states that she has a feeling of difficulty emptying her bladder, frequency, and small amounts since onset of her abdominal pain. Pt states she had some constipation but last normal BM was this morning. Pt denies fever, vomiting, diarrhea, and diaphoresis.   Pt denies taking immunosuppressant medications. Pt states she has been taking mobic for her pain. She also has some chronic back pain for which she takes daily hydrocodone. She states she has had reconstructive bladder surgery and a hysterectomy.    Past Medical History  Diagnosis Date  . Arthritis   . Diverticulosis   . GERD (gastroesophageal reflux disease)   . Osteoporosis   . Tuberous sclerosis     EXTERNAL LESIONS--  MAINLY HANDS  . Seasonal allergies   . Vulvar dysplasia   . H/O hiatal hernia   . History of diverticulitis of colon   . Nodule of right lung     BENIGN---  CT  10-28-2008  . History of gastric ulcer   . Shingles     march 2014--  back  . History of epilepsy     childhood age 68 to 64  ---secondary to high calcium deposts of optic nerve---  none since age 53 with pregency  . OSA (obstructive sleep apnea)     mild-- no cpap  . IBS (irritable bowel syndrome)   . Seizure   . History of kidney stones     Past Surgical History  Procedure Laterality Date  . Bladder surgery  1991    bladder  reconstruction of torsion  . Colonoscopy    . Polypectomy    . Upper gastrointestinal endoscopy    . Cataract extraction w/ intraocular lens  implant, bilateral    . Cholecystectomy  11-18-2003  . Cardiac catheterization  06-26-2001    NORMAL CORONARY ARTERIES  . Orif right bimalleolar ankle fx  12-18-2003  . 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 Lumbar disc surgery  1985  . Dilation and curettage of uterus  multiple -- last one 1975  . Vaginal hysterectomy  1975  . Hemorrhoid surgery  1970's  . Co2 laser application N/A 09/03/3293    Procedure: CO2 LASER APPLICATION;  Surgeon: Terrance Mass, MD;  Location: [facility];  Service: Gynecology;  Laterality: N/A;  CPT 57065  CO2 laser of vaginal and vulvar lesions.  . Back surgery    . Augmentation mammaplasty      SALINE  . Gynecologic cryosurgery      Family History  Problem Relation Age of Onset  . Heart disease Mother   . Tuberous sclerosis Mother   . Tuberous sclerosis Sister   . Tuberous sclerosis Daughter   . Tuberous sclerosis Son   . Tuberous sclerosis Maternal Aunt   .  Tuberous sclerosis Maternal Grandmother   . Tuberous sclerosis Maternal Aunt   . Tuberous sclerosis Maternal Aunt   . Tuberous sclerosis Cousin     History  Substance Use Topics  . Smoking status: Former Smoker -- 2.50 packs/day for 20 years    Types: Cigarettes    Quit date: 08/28/1992  . Smokeless tobacco: Never Used  . Alcohol Use: Yes     Comment: glass of wine occassional    OB History   Grav Para Term Preterm Abortions TAB SAB Ect Mult Living                  Review of Systems 10 Systems reviewed and all are negative for acute change except as noted in the HPI.   Allergies  Ciprofloxacin; Percocet; Zoloft; Contrast media; and Sulfa antibiotics  Home Medications   Prior to Admission medications   Medication Sig Start Date End Date Taking? Authorizing Provider   alendronate (FOSAMAX) 70 MG tablet Take 1 tablet (70 mg total) by mouth once a week. Take with a full glass of water on an empty stomach. 09/21/13   Terrance Mass, MD  cyanocobalamin (,VITAMIN B-12,) 1000 MCG/ML injection Inject 1 mL (1,000 mcg total) into the muscle once. 02/08/13   Rosalita Chessman, DO  ergocalciferol (VITAMIN D2) 50000 UNITS capsule Take one tablet every other week for 6 mos. 09/22/12   Terrance Mass, MD  HYDROcodone-acetaminophen (NORCO/VICODIN) 5-325 MG per tablet Take 1 tablet by mouth every 6 (six) hours as needed. For pain 09/14/13   Rosalita Chessman, DO  meloxicam (MOBIC) 15 MG tablet Take 15 mg by mouth daily.    Historical Provider, MD  Naltrexone-Bupropion HCl ER (CONTRAVE) 8-90 MG TB12 1 po qam x1 week, then 1 po bid x 1 week then 2 po qam and 1 po qpm x 1 week then 2 po bid 08/31/13   Rosalita Chessman, DO  omeprazole (PRILOSEC) 40 MG capsule Take 40 mg by mouth daily.    Historical Provider, MD  valACYclovir (VALTREX) 1000 MG tablet Take 1 tablet (1,000 mg total) by mouth 3 (three) times daily. 09/21/13   Terrance Mass, MD    Triage Vitals: BP 142/94  Pulse 110  Temp(Src) 98.8 F (37.1 C) (Oral)  Resp 18  SpO2 97%   Physical Exam  Nursing note and vitals reviewed. Constitutional: She is oriented to person, place, and time. She appears well-developed and well-nourished. No distress.  Awake, alert, nontoxic appearance.  HENT:  Head: Normocephalic and atraumatic.  Eyes: Right eye exhibits no discharge. Left eye exhibits no discharge.  Neck: Normal range of motion. Neck supple.  Cardiovascular: Normal rate and regular rhythm.  Exam reveals no gallop and no friction rub.   No murmur heard. Pulmonary/Chest: Effort normal and breath sounds normal. No respiratory distress. She has no wheezes. She has no rales. She exhibits no tenderness.  Abdominal: Soft. Normal appearance and bowel sounds are normal. There is tenderness (mild suprapubic only). There is no rebound  and no CVA tenderness.  Musculoskeletal: She exhibits no tenderness.  Baseline ROM, no obvious new focal weakness.  Neurological: She is alert and oriented to person, place, and time.  Mental status and motor strength appears baseline for patient and situation.  Skin: Skin is warm and dry. No rash noted. She is not diaphoretic.  Psychiatric: She has a normal mood and affect. Her behavior is normal.    ED Course  Procedures (including critical care time)  DIAGNOSTIC STUDIES: Oxygen Saturation is 97% on RA, normal by my interpretation.     COORDINATION OF CARE: 8:56 PM- Pt advised of plan for treatment and pt agrees.   10:00 PM- Chaperone present for pelvic exam, moderate purulent discharge in vaginal vault with no lesions noted and non tender bimanual exam without palpable masses.   10:42 PM- Patient informed of clinical course, understand medical decision-making process, and agree with plan.    Labs Review Labs Reviewed  WET PREP, GENITAL - Abnormal; Notable for the following:    WBC, Wet Prep HPF POC MANY (*)    All other components within normal limits  URINALYSIS, ROUTINE W REFLEX MICROSCOPIC - Abnormal; Notable for the following:    Color, Urine AMBER (*)    APPearance TURBID (*)    Specific Gravity, Urine 1.034 (*)    Hgb urine dipstick MODERATE (*)    Bilirubin Urine SMALL (*)    Ketones, ur 15 (*)    Protein, ur 100 (*)    Leukocytes, UA LARGE (*)    All other components within normal limits  URINE MICROSCOPIC-ADD ON - Abnormal; Notable for the following:    Bacteria, UA MANY (*)    Casts HYALINE CASTS (*)    All other components within normal limits  BASIC METABOLIC PANEL - Abnormal; Notable for the following:    Potassium 3.1 (*)    Glucose, Bld 108 (*)    All other components within normal limits  CBC WITH DIFFERENTIAL - Abnormal; Notable for the following:    WBC 14.6 (*)    Neutrophils Relative % 79 (*)    Neutro Abs 11.5 (*)    Monocytes Absolute  1.1 (*)    All other components within normal limits  WOUND CULTURE  URINE CULTURE    Imaging Review No results found.    EKG Interpretation None      MDM  The patient appears reasonably screened and/or stabilized for discharge and I doubt any other medical condition or other [facility] requiring further screening, evaluation, or treatment in the ED at this time prior to discharge.   Final diagnoses:  UTI (lower urinary tract infection)  Vaginitis    I doubt any other EMC precluding discharge at this time including, but not necessarily limited to the following:sepsis.  I personally performed the services described in this documentation, which was scribed in my presence. The recorded information has been reviewed and is accurate.     Samantha Relic, MD 10/18/13 [phone]

## 2013-10-17 LAB — URINE CULTURE: Colony Count: 30000

## 2013-10-18 DIAGNOSIS — M5137 Other intervertebral disc degeneration, lumbosacral region: Secondary | ICD-10-CM | POA: Diagnosis not present

## 2013-10-18 DIAGNOSIS — M461 Sacroiliitis, not elsewhere classified: Secondary | ICD-10-CM | POA: Diagnosis not present

## 2013-10-18 DIAGNOSIS — IMO0002 Reserved for concepts with insufficient information to code with codable children: Secondary | ICD-10-CM | POA: Diagnosis not present

## 2013-10-18 LAB — URINE CULTURE

## 2013-10-19 ENCOUNTER — Encounter: Payer: Self-pay | Admitting: Gynecology

## 2013-10-19 ENCOUNTER — Ambulatory Visit (INDEPENDENT_AMBULATORY_CARE_PROVIDER_SITE_OTHER): Payer: Medicare Other | Admitting: Gynecology

## 2013-10-19 VITALS — BP 132/84

## 2013-10-19 DIAGNOSIS — N898 Other specified noninflammatory disorders of vagina: Secondary | ICD-10-CM

## 2013-10-19 DIAGNOSIS — N390 Urinary tract infection, site not specified: Secondary | ICD-10-CM

## 2013-10-19 LAB — WOUND CULTURE: Culture: NORMAL

## 2013-10-19 NOTE — Progress Notes (Signed)
   Patient presented to the office today as a followup from her emergency room visit 3 days ago. She had been complaining of a vaginal discharge along with burning with urination. She also been complaining of low abdominal discomfort for one week. Patient denied any fever, vomiting, diarrhea or any back pain.  She stated she had some greenish vaginal discharge for which a culture was done in the emergency room along with her wet prep. The wet prep demonstrated many white blood cells.  The vaginal wet prep demonstrated abundant white blood cells moderate gram-positive cocci Vaginal cultures normal vaginal flareup no staph aureus no group A strep, no group B strep.   Her urine culture and demonstrated Therefore  Patient with history of vaginal hysterectomy in 1975  She was asymptomatic today. She was prescribed Keflex 500 mg twice a day for 7 days and instructed to follow up with her gynecologist today. Patient stated that she had a bedside ultrasound which was reported to be normal. No official documentation seen in patient's chart.  Exam: Patient in no acute distress Abdomen: Soft nontender no rebound or guarding Pelvic: Bartholin urethra Skene was within normal limits Vagina: Vaginal cuff intact no lesions were seen Bimanual exam: No palpable mass or tenderness Rectal exam: Unremarkable  Assessment/plan: Patient treated for vaginal infection/UTI in the ED this past Saturday on Keflex 500 mg twice a day during well asymptomatic today etiology? Informed the patient to return back to the office of any recurrence of symptoms for reassessment. She was instructed to finish off her antibiotic.

## 2013-11-01 DIAGNOSIS — Q851 Tuberous sclerosis: Secondary | ICD-10-CM | POA: Diagnosis not present

## 2013-11-01 DIAGNOSIS — IMO0002 Reserved for concepts with insufficient information to code with codable children: Secondary | ICD-10-CM | POA: Diagnosis not present

## 2013-11-02 ENCOUNTER — Ambulatory Visit (INDEPENDENT_AMBULATORY_CARE_PROVIDER_SITE_OTHER): Payer: Medicare Other | Admitting: *Deleted

## 2013-11-02 DIAGNOSIS — E538 Deficiency of other specified B group vitamins: Secondary | ICD-10-CM

## 2013-11-02 MED ORDER — CYANOCOBALAMIN 1000 MCG/ML IJ SOLN
1000.0000 ug | Freq: Once | INTRAMUSCULAR | Status: AC
  Administered 2013-11-02: 1000 ug via INTRAMUSCULAR

## 2013-11-12 ENCOUNTER — Ambulatory Visit (INDEPENDENT_AMBULATORY_CARE_PROVIDER_SITE_OTHER): Payer: Medicare Other | Admitting: Physician Assistant

## 2013-11-12 ENCOUNTER — Telehealth: Payer: Self-pay | Admitting: *Deleted

## 2013-11-12 ENCOUNTER — Emergency Department (HOSPITAL_BASED_OUTPATIENT_CLINIC_OR_DEPARTMENT_OTHER): Payer: Medicare Other

## 2013-11-12 ENCOUNTER — Encounter (HOSPITAL_BASED_OUTPATIENT_CLINIC_OR_DEPARTMENT_OTHER): Payer: Self-pay | Admitting: Emergency Medicine

## 2013-11-12 ENCOUNTER — Emergency Department (HOSPITAL_BASED_OUTPATIENT_CLINIC_OR_DEPARTMENT_OTHER)
Admission: EM | Admit: 2013-11-12 | Discharge: 2013-11-12 | Disposition: A | Discharge status: Home / Self Care | Payer: Medicare Other | Attending: Emergency Medicine | Admitting: Emergency Medicine

## 2013-11-12 DIAGNOSIS — M129 Arthropathy, unspecified: Secondary | ICD-10-CM | POA: Insufficient documentation

## 2013-11-12 DIAGNOSIS — Z87891 Personal history of nicotine dependence: Secondary | ICD-10-CM | POA: Diagnosis not present

## 2013-11-12 DIAGNOSIS — K219 Gastro-esophageal reflux disease without esophagitis: Secondary | ICD-10-CM | POA: Insufficient documentation

## 2013-11-12 DIAGNOSIS — N201 Calculus of ureter: Secondary | ICD-10-CM | POA: Insufficient documentation

## 2013-11-12 DIAGNOSIS — N200 Calculus of kidney: Secondary | ICD-10-CM | POA: Diagnosis not present

## 2013-11-12 DIAGNOSIS — K5730 Diverticulosis of large intestine without perforation or abscess without bleeding: Secondary | ICD-10-CM | POA: Diagnosis not present

## 2013-11-12 DIAGNOSIS — Z79899 Other long term (current) drug therapy: Secondary | ICD-10-CM | POA: Diagnosis not present

## 2013-11-12 DIAGNOSIS — R509 Fever, unspecified: Secondary | ICD-10-CM | POA: Diagnosis not present

## 2013-11-12 DIAGNOSIS — Z8669 Personal history of other diseases of the nervous system and sense organs: Secondary | ICD-10-CM | POA: Diagnosis not present

## 2013-11-12 DIAGNOSIS — Z8619 Personal history of other infectious and parasitic diseases: Secondary | ICD-10-CM | POA: Diagnosis not present

## 2013-11-12 DIAGNOSIS — M810 Age-related osteoporosis without current pathological fracture: Secondary | ICD-10-CM | POA: Insufficient documentation

## 2013-11-12 DIAGNOSIS — R0602 Shortness of breath: Secondary | ICD-10-CM | POA: Insufficient documentation

## 2013-11-12 DIAGNOSIS — R34 Anuria and oliguria: Secondary | ICD-10-CM

## 2013-11-12 DIAGNOSIS — Z8742 Personal history of other diseases of the female genital tract: Secondary | ICD-10-CM | POA: Diagnosis not present

## 2013-11-12 DIAGNOSIS — Z791 Long term (current) use of non-steroidal anti-inflammatories (NSAID): Secondary | ICD-10-CM | POA: Diagnosis not present

## 2013-11-12 LAB — BASIC METABOLIC PANEL
BUN: 19 mg/dL (ref 6–23)
CALCIUM: 9.3 mg/dL (ref 8.4–10.5)
CHLORIDE: 105 meq/L (ref 96–112)
CO2: 19 mEq/L (ref 19–32)
Creatinine, Ser: 1.2 mg/dL — ABNORMAL HIGH (ref 0.50–1.10)
GFR calc Af Amer: 53 mL/min — ABNORMAL LOW (ref >90)
GFR calc non Af Amer: 46 mL/min — ABNORMAL LOW (ref >90)
Glucose, Bld: 96 mg/dL (ref 70–99)
Potassium: 3.3 mEq/L — ABNORMAL LOW (ref 3.7–5.3)
Sodium: 140 mEq/L (ref 137–147)

## 2013-11-12 LAB — URINALYSIS, ROUTINE W REFLEX MICROSCOPIC
Glucose, UA: NEGATIVE mg/dL
HGB URINE DIPSTICK: NEGATIVE
KETONES UR: 15 mg/dL — AB
Leukocytes, UA: NEGATIVE
Nitrite: NEGATIVE
PH: 5.5 (ref 5.0–8.0)
PROTEIN: NEGATIVE mg/dL
Specific Gravity, Urine: 1.035 — ABNORMAL HIGH (ref 1.005–1.030)
Urobilinogen, UA: 1 mg/dL (ref 0.0–1.0)

## 2013-11-12 LAB — CBC WITH DIFFERENTIAL/PLATELET
BASOS PCT: 0 % (ref 0–1)
Basophils Absolute: 0 10*3/uL (ref 0.0–0.1)
EOS PCT: 1 % (ref 0–5)
Eosinophils Absolute: 0.1 10*3/uL (ref 0.0–0.7)
HEMATOCRIT: 38 % (ref 36.0–46.0)
Hemoglobin: 13.3 g/dL (ref 12.0–15.0)
Lymphocytes Relative: 22 % (ref 12–46)
Lymphs Abs: 2.2 10*3/uL (ref 0.7–4.0)
MCH: 31.9 pg (ref 26.0–34.0)
MCHC: 35 g/dL (ref 30.0–36.0)
MCV: 91.1 fL (ref 78.0–100.0)
MONO ABS: 0.9 10*3/uL (ref 0.1–1.0)
MONOS PCT: 9 % (ref 3–12)
NEUTROS ABS: 6.8 10*3/uL (ref 1.7–7.7)
Neutrophils Relative %: 68 % (ref 43–77)
Platelets: 194 10*3/uL (ref 150–400)
RBC: 4.17 MIL/uL (ref 3.87–5.11)
RDW: 13.6 % (ref 11.5–15.5)
WBC: 10 10*3/uL (ref 4.0–10.5)

## 2013-11-12 MED ORDER — NAPROXEN 500 MG PO TABS: 500.0000 mg | ORAL_TABLET | Freq: Two times a day (BID) | ORAL | Status: DC

## 2013-11-12 MED ORDER — SODIUM CHLORIDE 0.9 % IV BOLUS (SEPSIS)
500.0000 mL | Freq: Once | INTRAVENOUS | Status: AC
  Administered 2013-11-12: 500 mL via INTRAVENOUS

## 2013-11-12 MED ORDER — SODIUM CHLORIDE 0.9 % IV SOLN: INTRAVENOUS | Status: DC

## 2013-11-12 MED ORDER — HYDROMORPHONE HCL PF 1 MG/ML IJ SOLN
1.0000 mg | Freq: Once | INTRAMUSCULAR | Status: AC
  Administered 2013-11-12: 1 mg via INTRAVENOUS
  Filled 2013-11-12: qty 1

## 2013-11-12 MED ORDER — HYDROCODONE-ACETAMINOPHEN 5-325 MG PO TABS: 1.0000 | ORAL_TABLET | Freq: Four times a day (QID) | ORAL | Status: DC | PRN

## 2013-11-12 MED ORDER — ONDANSETRON HCL 4 MG/2ML IJ SOLN
4.0000 mg | Freq: Once | INTRAMUSCULAR | Status: AC
  Administered 2013-11-12: 4 mg via INTRAVENOUS
  Filled 2013-11-12: qty 2

## 2013-11-12 MED ORDER — ONDANSETRON 4 MG PO TBDP: 4.0000 mg | ORAL_TABLET | Freq: Three times a day (TID) | ORAL | Status: DC | PRN

## 2013-11-12 NOTE — ED Notes (Signed)
Pt sent from [facility]D office for eval, c/o right lower back pain , unable to void since 7 am. Pt also c/o lower abd pain

## 2013-11-12 NOTE — Telephone Encounter (Signed)
Late Entry:  Spoke with pt who stated she was having back and leg pain. She thought it may be a kidney stone because she had not urinated in 3 hours. Patient stated that when she urinated it was not bloody but that it was a little uncomfortable. Patient stated her main concern was the leg pain she was having. She went on to report that she was recently seen in the ER for a vaginal abscess but she was having no discharge at this time.   Patient again stated that she wanted to be seen for the leg pain and possibly have Xrays. I advised that she could be seen at our [facility] office and if the provider felt it was necessary they could order imaging to be done on site. Patient was agreeable to see Elyn Aquas, PA for leg and back pain.

## 2013-11-12 NOTE — ED Notes (Signed)
MD at bedside. 

## 2013-11-12 NOTE — Progress Notes (Signed)
Patient presents to clinic today c/o severe right-sided low back pain with radiation into her RLE. Patient endorses numbness and tingling of her groin.  Also endorses she has been unable to urinate for 1.5-2 days.  Patient denies change to bowel habits.  Patient has been able to ambulate.  Patient with previous x-ray lumbar spine showing some mild loss of joint space of lumbar spine.  No MRI in system.  Patient called her OB/GYN office this morning, due to urinary symptoms, and was advised to call her PCP or proceed directly to the ER. Patient called her PCP's office Dr. Etter Sjogren and was placed on our schedule.  There is no triage note from the [place] office in the system at time of interview.  Past Medical History  Diagnosis Date  . Arthritis   . Diverticulosis   . GERD (gastroesophageal reflux disease)   . Osteoporosis   . Tuberous sclerosis     EXTERNAL LESIONS--  MAINLY HANDS  . Seasonal allergies   . Vulvar dysplasia   . H/O hiatal hernia   . History of diverticulitis of colon   . Nodule of right lung     BENIGN---  CT  10-28-2008  . History of gastric ulcer   . Shingles     march 2014--  back  . History of epilepsy     childhood age 82 to 33  ---secondary to high calcium deposts of optic nerve---  none since age 66 with pregency  . OSA (obstructive sleep apnea)     mild-- no cpap  . IBS (irritable bowel syndrome)   . Seizure   . History of kidney stones     Current Outpatient Prescriptions on File Prior to Visit  Medication Sig Dispense Refill  . alendronate (FOSAMAX) 70 MG tablet Take 1 tablet (70 mg total) by mouth once a week. Take with a full glass of water on an empty stomach.  30 tablet  11  . cephALEXin (KEFLEX) 500 MG capsule 2 caps po bid x 7 days  28 capsule  0  . cyanocobalamin (,VITAMIN B-12,) 1000 MCG/ML injection Inject 1 mL (1,000 mcg total) into the muscle once.  1 mL  0  . ergocalciferol (VITAMIN D2) 50000 UNITS capsule Take one tablet every other week for 6 mos.  6  capsule  1  . HYDROcodone-acetaminophen (NORCO) 5-325 MG per tablet Take 2 tablets by mouth every 6 (six) hours as needed for severe pain.  10 tablet  0  . HYDROcodone-acetaminophen (NORCO/VICODIN) 5-325 MG per tablet Take 1 tablet by mouth every 6 (six) hours as needed. For pain  30 tablet  0  . meloxicam (MOBIC) 15 MG tablet Take 15 mg by mouth daily.      . metroNIDAZOLE (FLAGYL) 500 MG tablet Take 1 tablet (500 mg total) by mouth 2 (two) times daily. One po bid x 7 days  14 tablet  0  . Naltrexone-Bupropion HCl ER (CONTRAVE) 8-90 MG TB12 1 po qam x1 week, then 1 po bid x 1 week then 2 po qam and 1 po qpm x 1 week then 2 po bid  60 tablet  2  . omeprazole (PRILOSEC) 40 MG capsule Take 40 mg by mouth daily.      . valACYclovir (VALTREX) 1000 MG tablet Take 1 tablet (1,000 mg total) by mouth 3 (three) times daily.  21 tablet  0   No current facility-administered medications on file prior to visit.    Allergies  Allergen Reactions  .  Ciprofloxacin   . Percocet [Oxycodone-Acetaminophen] Nausea And Vomiting  . Zoloft [Sertraline Hcl]     Pt can not take SSRI  . Contrast Media [Iodinated Diagnostic Agents] Rash  . Sulfa Antibiotics Rash    Family History  Problem Relation Age of Onset  . Heart disease Mother   . Tuberous sclerosis Mother   . Tuberous sclerosis Sister   . Tuberous sclerosis Daughter   . Tuberous sclerosis Son   . Tuberous sclerosis Maternal Aunt   . Tuberous sclerosis Maternal Grandmother   . Tuberous sclerosis Maternal Aunt   . Tuberous sclerosis Maternal Aunt   . Tuberous sclerosis Cousin     History   Social History  . Marital Status: Divorced    Spouse Name: N/A    Number of Children: N/A  . Years of Education: N/A   Occupational History  . driver Other   Social History Main Topics  . Smoking status: Former Smoker -- 2.50 packs/day for 20 years    Types: Cigarettes    Quit date: 08/28/1992  . Smokeless tobacco: Never Used  . Alcohol Use: Yes      Comment: glass of wine occassional  . Drug Use: No  . Sexual Activity: Not Currently    Partners: Male   Other Topics Concern  . Not on file   Social History Narrative   Exercise-no   Review of Systems - See HPI.  All other ROS are negative.  There were no vitals taken for this visit.  Physical Exam  Vitals reviewed. Constitutional: She is oriented to person, place, and time and well-developed, well-nourished, and in no distress.  HENT:  Head: Normocephalic and atraumatic.  Eyes: Conjunctivae are normal.  Cardiovascular: Normal rate, regular rhythm, normal heart sounds and intact distal pulses.   Pulmonary/Chest: Effort normal.  Musculoskeletal: She exhibits tenderness.  Neurological: She is alert and oriented to person, place, and time.  Skin: Skin is warm and dry.  Psychiatric: Affect normal.   Recent Results (from the past 2160 hour(s))  POCT URINALYSIS DIPSTICK     Status: Abnormal   Collection Time    09/14/13  9:24 AM      Result Value Ref Range   Color, UA Dark Yellow     Clarity, UA Clear     Glucose, UA Neg     Bilirubin, UA Neg     Ketones, UA Neg     Spec Grav, UA 1.010     Blood, UA trace     pH, UA 6.0     Protein, UA Neg     Urobilinogen, UA 0.2     Nitrite, UA Neg     Leukocytes, UA small (1+)    URINE CULTURE     Status: None   Collection Time    09/14/13 10:04 AM      Result Value Ref Range   Colony Count 75,000 COLONIES/ML     Organism ID, Bacteria Multiple bacterial morphotypes present, none     Organism ID, Bacteria predominant. Suggest appropriate recollection if      Organism ID, Bacteria clinically indicated.    BASIC METABOLIC PANEL     Status: Abnormal   Collection Time    09/14/13 10:04 AM      Result Value Ref Range   Sodium 134 (*) 135 - 145 mEq/L   Potassium 3.5  3.5 - 5.1 mEq/L   Chloride 100  96 - 112 mEq/L   CO2 22  19 - 32 mEq/L  Glucose, Bld 84  70 - 99 mg/dL   BUN 14  6 - 23 mg/dL   Creatinine, Ser 0.7  0.4 - 1.2 mg/dL    Calcium 9.5  8.4 - 10.5 mg/dL   GFR 96.89  >60.00 mL/min  CBC WITH DIFFERENTIAL     Status: None   Collection Time    09/14/13 10:04 AM      Result Value Ref Range   WBC 6.8  4.5 - 10.5 K/uL   RBC 4.76  3.87 - 5.11 Mil/uL   Hemoglobin 14.8  12.0 - 15.0 g/dL   HCT 43.0  36.0 - 46.0 %   MCV 90.3  78.0 - 100.0 fl   MCHC 34.5  30.0 - 36.0 g/dL   RDW 13.9  11.5 - 14.6 %   Platelets 197.0  150.0 - 400.0 K/uL   Neutrophils Relative % 76.7  43.0 - 77.0 %   Lymphocytes Relative 14.5  12.0 - 46.0 %   Monocytes Relative 7.3  3.0 - 12.0 %   Eosinophils Relative 0.9  0.0 - 5.0 %   Basophils Relative 0.6  0.0 - 3.0 %   Neutro Abs 5.2  1.4 - 7.7 K/uL   Lymphs Abs 1.0  0.7 - 4.0 K/uL   Monocytes Absolute 0.5  0.1 - 1.0 K/uL   Eosinophils Absolute 0.1  0.0 - 0.7 K/uL   Basophils Absolute 0.0  0.0 - 0.1 K/uL  HEPATIC FUNCTION PANEL     Status: None   Collection Time    09/14/13 10:04 AM      Result Value Ref Range   Total Bilirubin 0.9  0.3 - 1.2 mg/dL   Bilirubin, Direct 0.1  0.0 - 0.3 mg/dL   Alkaline Phosphatase 90  39 - 117 U/L   AST 18  0 - 37 U/L   ALT 29  0 - 35 U/L   Total Protein 7.8  6.0 - 8.3 g/dL   Albumin 3.7  3.5 - 5.2 g/dL  TSH     Status: None   Collection Time    09/14/13 10:04 AM      Result Value Ref Range   TSH 1.91  0.35 - 5.50 uIU/mL  POCT URINALYSIS DIPSTICK     Status: None   Collection Time    10/15/13  4:07 PM      Result Value Ref Range   Color, UA yellow     Clarity, UA clear     Glucose, UA neg     Bilirubin, UA neg     Ketones, UA moderate     Spec Grav, UA 1.010     Blood, UA trace     pH, UA 6.0     Protein, UA neg     Urobilinogen, UA 0.2     Nitrite, UA neg     Leukocytes, UA Negative    URINE CULTURE     Status: None   Collection Time    10/15/13  4:40 PM      Result Value Ref Range   Colony Count 30,000 COLONIES/ML     Organism ID, Bacteria Multiple bacterial morphotypes present, none     Organism ID, Bacteria predominant. Suggest  appropriate recollection if      Organism ID, Bacteria clinically indicated.    BASIC METABOLIC PANEL     Status: None   Collection Time    10/15/13  4:40 PM      Result Value Ref Range   Sodium 135  135 - 145 mEq/L   Potassium 3.5  3.5 - 5.3 mEq/L   Chloride 100  96 - 112 mEq/L   CO2 22  19 - 32 mEq/L   Glucose, Bld 98  70 - 99 mg/dL   BUN 10  6 - 23 mg/dL   Creat 0.56  0.50 - 1.10 mg/dL   Calcium 9.3  8.4 - 10.5 mg/dL  CBC WITH DIFFERENTIAL     Status: Abnormal   Collection Time    10/15/13  4:40 PM      Result Value Ref Range   WBC 13.2 (*) 4.0 - 10.5 K/uL   RBC 4.40  3.87 - 5.11 MIL/uL   Hemoglobin 13.3  12.0 - 15.0 g/dL   HCT 39.0  36.0 - 46.0 %   MCV 88.6  78.0 - 100.0 fL   MCH 30.2  26.0 - 34.0 pg   MCHC 34.1  30.0 - 36.0 g/dL   RDW 13.4  11.5 - 15.5 %   Platelets 338  150 - 400 K/uL   Neutrophils Relative % 81 (*) 43 - 77 %   Neutro Abs 10.7 (*) 1.7 - 7.7 K/uL   Lymphocytes Relative 11 (*) 12 - 46 %   Lymphs Abs 1.5  0.7 - 4.0 K/uL   Monocytes Relative 7  3 - 12 %   Monocytes Absolute 0.9  0.1 - 1.0 K/uL   Eosinophils Relative 1  0 - 5 %   Eosinophils Absolute 0.1  0.0 - 0.7 K/uL   Basophils Relative 0  0 - 1 %   Basophils Absolute 0.0  0.0 - 0.1 K/uL   Smear Review Criteria for review not met    HEPATIC FUNCTION PANEL     Status: Abnormal   Collection Time    10/15/13  4:40 PM      Result Value Ref Range   Total Bilirubin 0.8  0.2 - 1.2 mg/dL   Bilirubin, Direct 0.2  0.0 - 0.3 mg/dL   Indirect Bilirubin 0.6  0.2 - 1.2 mg/dL   Alkaline Phosphatase 125 (*) 39 - 117 U/L   AST 16  0 - 37 U/L   ALT 23  0 - 35 U/L   Total Protein 6.7  6.0 - 8.3 g/dL   Albumin 3.8  3.5 - 5.2 g/dL  URINALYSIS, ROUTINE W REFLEX MICROSCOPIC     Status: Abnormal   Collection Time    10/16/13  7:35 PM      Result Value Ref Range   Color, Urine AMBER (*) YELLOW   Comment: BIOCHEMICALS MAY BE AFFECTED BY COLOR   APPearance TURBID (*) CLEAR   Specific Gravity, Urine 1.034 (*) 1.005  - 1.030   pH 5.5  5.0 - 8.0   Glucose, UA NEGATIVE  NEGATIVE mg/dL   Hgb urine dipstick MODERATE (*) NEGATIVE   Bilirubin Urine SMALL (*) NEGATIVE   Ketones, ur 15 (*) NEGATIVE mg/dL   Protein, ur 100 (*) NEGATIVE mg/dL   Urobilinogen, UA 1.0  0.0 - 1.0 mg/dL   Nitrite NEGATIVE  NEGATIVE   Leukocytes, UA LARGE (*) NEGATIVE  URINE MICROSCOPIC-ADD ON     Status: Abnormal   Collection Time    10/16/13  7:35 PM      Result Value Ref Range   Squamous Epithelial / LPF RARE  RARE   WBC, UA TOO NUMEROUS TO COUNT  <3 WBC/hpf   RBC / HPF 21-50  <3 RBC/hpf   Bacteria, UA MANY (*) RARE  Casts HYALINE CASTS (*) NEGATIVE   Urine-Other MUCOUS PRESENT     Comment: LESS THAN 10 mL OF URINE SUBMITTED  URINE CULTURE     Status: None   Collection Time    10/16/13  7:35 PM      Result Value Ref Range   Specimen Description URINE, CLEAN CATCH     Special Requests NONE     Culture  Setup Time       Value: 10/17/2013 03:34     Performed at [facility] Count       Value: 20,OOO COLONIES/ML     Performed at [facility]   Culture       Value: Multiple bacterial morphotypes present, none predominant. Suggest appropriate recollection if clinically indicated.     Performed at [facility]   Report Status 10/18/2013 FINAL    BASIC METABOLIC PANEL     Status: Abnormal   Collection Time    10/16/13  8:59 PM      Result Value Ref Range   Sodium 138  137 - 147 mEq/L   Potassium 3.1 (*) 3.7 - 5.3 mEq/L   Chloride 96  96 - 112 mEq/L   CO2 25  19 - 32 mEq/L   Glucose, Bld 108 (*) 70 - 99 mg/dL   BUN 14  6 - 23 mg/dL   Creatinine, Ser 0.60  0.50 - 1.10 mg/dL   Calcium 9.4  8.4 - 10.5 mg/dL   GFR calc non Af Amer >90  >90 mL/min   GFR calc Af Amer >90  >90 mL/min   Comment: (NOTE)     The eGFR has been calculated using the CKD EPI equation.     This calculation has not been validated in all clinical situations.     eGFR's persistently <90 mL/min signify possible  Chronic Kidney     Disease.  CBC WITH DIFFERENTIAL     Status: Abnormal   Collection Time    10/16/13  8:59 PM      Result Value Ref Range   WBC 14.6 (*) 4.0 - 10.5 K/uL   RBC 4.06  3.87 - 5.11 MIL/uL   Hemoglobin 12.8  12.0 - 15.0 g/dL   HCT 36.9  36.0 - 46.0 %   MCV 90.9  78.0 - 100.0 fL   MCH 31.5  26.0 - 34.0 pg   MCHC 34.7  30.0 - 36.0 g/dL   RDW 12.6  11.5 - 15.5 %   Platelets 282  150 - 400 K/uL   Neutrophils Relative % 79 (*) 43 - 77 %   Neutro Abs 11.5 (*) 1.7 - 7.7 K/uL   Lymphocytes Relative 13  12 - 46 %   Lymphs Abs 1.9  0.7 - 4.0 K/uL   Monocytes Relative 7  3 - 12 %   Monocytes Absolute 1.1 (*) 0.1 - 1.0 K/uL   Eosinophils Relative 1  0 - 5 %   Eosinophils Absolute 0.1  0.0 - 0.7 K/uL   Basophils Relative 0  0 - 1 %   Basophils Absolute 0.0  0.0 - 0.1 K/uL  WET PREP, GENITAL     Status: Abnormal   Collection Time    10/16/13 10:03 PM      Result Value Ref Range   Yeast Wet Prep HPF POC NONE SEEN  NONE SEEN   Trich, Wet Prep NONE SEEN  NONE SEEN   Clue Cells Wet Prep HPF POC NONE SEEN  NONE SEEN   WBC, Wet Prep HPF POC MANY (*) NONE SEEN  WOUND CULTURE     Status: None   Collection Time    10/16/13 10:03 PM      Result Value Ref Range   Specimen Description VAGINA     Special Requests NONE     Gram Stain       Value: ABUNDANT WBC PRESENT,BOTH PMN AND MONONUCLEAR     NO SQUAMOUS EPITHELIAL CELLS SEEN     MODERATE GRAM POSITIVE COCCI     IN PAIRS IN CHAINS     Performed at [facility]   Culture       Value: NORMAL VAGINAL FLORA     Note: NO STAPHYLOCOCCUS AUREUS ISOLATED NO GROUP A STREP (S.PYOGENES) ISOLATED NO GROUP B STREP (S.AGALACTIAE) ISOLATED     Performed at [facility]   Report Status 10/19/2013 FINAL      Assessment/Plan: Anuria Patient endorses x 1.5-2 days.  With radicular low back pain and some saddle anesthesia.  Patient triaged downstairs to ER for more acute workup and stat imaging to r/o cauda equina.

## 2013-11-12 NOTE — Progress Notes (Signed)
Pre visit review using our clinic review tool, if applicable. No additional management support is needed unless otherwise documented below in the visit note/SLS  

## 2013-11-12 NOTE — Telephone Encounter (Signed)
Patient presented to office to see Elyn Aquas, PA-C for 4:00p appointment scheduled by Julienne Kass, RN at patient's PCP office [[facility] [place]]; prior to rooming patient I viewed pt's chart for prior information to assist with visit [done with patients that we have never seen prior] and read phone note from earlier today when pt called her GYN office:  Kelvin Cellar at 11/12/2013 12:42 PM    Status: Signed       Pt called c/o leg pain back pain, unable to void for 3 days now, due to schedules with provider uable to schedule OV today. I advised pt urgent care or ER or PCP best option.    Upon rooming patient, she reiterated this same information to myself and stated that she had spoken with Katharine Look at Dr. Nonda Lou office earlier and given her the same information and felt like she was not given proper patient care; she proceeded to tell me that she had had issues with feeling "like her PCP was not addressing her medical issues properly [i.e. Kidney stones not passed & no referral to Urology]" and she stated that she had been in [facility] ED last month [and asked me to look at the note with her] d/t having an internal "[vaginal] abscess that had burst" and she had not had a f/u and/or had this matter addressed with her PCP". I informed patient that there had been No Phone Note entered into patient's EMR today from her conversation with Julienne Kass at the [place] office, only an appointment placed for back & leg pain; and that I was only aware of her Voiding trouble from reading the note placed by her GYN office today. I apologized to the patient for any feelings of compromised care she may have been given prior, and that we would proceed forward today to do our very best to take care of her medical issues at hand. I informed pt that that I was going to address the provider with her issues, as I felt there was an urgency to them, as I felt that we may need to send her directly downstairs to the ED; pt agreed  and provider concurred after speaking with patient. Charge Nurse in ED was notified and pt was escorted to ED in wheelchair. Mr. Hassell Done phoned OM, Izora Gala, at [facility] regarding patient's concerns and was told that we would need to speak with Katharine Look about the matter [see phone note placed after this contact by Ms. Johnson]. Patient informed that she should f/u with her PCP after ED visit and that we were always available to her as well/SLS

## 2013-11-12 NOTE — Discharge Instructions (Signed)
Kidney Stones Kidney stones (urolithiasis) are solid masses that form inside your kidneys. The intense pain is caused by the stone moving through the kidney, ureter, bladder, and urethra (urinary tract). When the stone moves, the ureter starts to spasm around the stone. The stone is usually passed in your pee (urine).  HOME CARE  Drink enough fluids to keep your pee clear or pale yellow. This helps to get the stone out.  Strain all pee through the provided strainer. Do not pee without peeing through the strainer, not even once. If you pee the stone out, catch it in the strainer. The stone may be as small as a grain of salt. Take this to your doctor. This will help your doctor figure out what you can do to try to prevent more kidney stones.  Only take medicine as told by your doctor.  Follow up with your doctor as told.  Get follow-up X-rays as told by your doctor. GET HELP IF: You have pain that gets worse even if you have been taking pain medicine. GET HELP RIGHT AWAY IF:   Your pain does not get better with medicine.  You have a fever or shaking chills.  Your pain increases and gets worse over 18 hours.  You have new belly (abdominal) pain.  You feel faint or pass out.  You are unable to pee. MAKE SURE YOU:   Understand these instructions.  Will watch your condition.  Will get help right away if you are not doing well or get worse. Document Released: 11/06/2007 Document Revised: 01/20/2013 Document Reviewed: 10/21/2012 [facility] Patient Information 2014 [place], [place].  Chest pain medicine as needed. Take antinausea medicine as needed. Take the Naprosyn on a regular basis for the next few days it can help relax the ureter. Hydrate yourself well. They can come in to follow up with your regular doctor early next week. Also referral information provided to urology if you need a urology referral. Return for any newer worse symptoms worse pain fevers persistent vomiting inability  to take the medicine.

## 2013-11-12 NOTE — Assessment & Plan Note (Signed)
Patient endorses x 1.5-2 days.  With radicular low back pain and some saddle anesthesia.  Patient triaged downstairs to ER for more acute workup and stat imaging to r/o cauda equina.

## 2013-11-12 NOTE — ED Notes (Signed)
Pt sent by PA Elyn Aquas from [place] for low back pain and difficulty urinating. Would like pt ruled out for "cauda equina syndrome"

## 2013-11-12 NOTE — Telephone Encounter (Signed)
Pt called c/o leg pain back pain, unable to void for 3 days now, due to schedules with provider uable to schedule OV today. I advised pt urgent care or ER or PCP best option.

## 2013-11-12 NOTE — ED Notes (Signed)
Patient transported to CT 

## 2013-11-12 NOTE — Patient Instructions (Signed)
No AVS -- patient sent to ER.

## 2013-11-12 NOTE — ED Notes (Signed)
Minimal urine output in foley bag.

## 2013-11-12 NOTE — ED Provider Notes (Signed)
CSN: [id number]     Arrival date & time 11/12/13  1641 History   First MD Initiated Contact with Patient 11/12/13 1651     Chief Complaint  Patient presents with  . Back Pain     (Consider location/radiation/quality/duration/timing/severity/associated sxs/prior Treatment) Patient is a 68 y.o. female presenting with back pain. The history is provided by the patient.  Back Pain Associated symptoms: abdominal pain, dysuria and fever   Associated symptoms: no chest pain and no headaches    patient sent down from primary care office for evaluation of suprapubic abdominal pain and right flank pain and some right CVA pain. Patient stated that last evening she started the urgency to have to void frequently and was doing that frequently. Here today she skin the urgency to void and is having difficulty voiding. The pain has persisted since its onset last evening. Shared with nausea and vomiting patient has felt feverish but no documented fever. No blood in her urine. No bowel incontinence.  Patient has a history of of some lumbar back problems but has been reactivated followed very closely by spine specialist in [place] area. Gets injections to that area. That has been present for several weeks to months now nothing new or worse about that.  Patient describes the abdominal pain and flank pain is 8/10. Sharp and aching in nature and sometimes burning in nature.  Past Medical History  Diagnosis Date  . Arthritis   . Diverticulosis   . GERD (gastroesophageal reflux disease)   . Osteoporosis   . Tuberous sclerosis     EXTERNAL LESIONS--  MAINLY HANDS  . Seasonal allergies   . Vulvar dysplasia   . H/O hiatal hernia   . History of diverticulitis of colon   . Nodule of right lung     BENIGN---  CT  10-28-2008  . History of gastric ulcer   . Shingles     march 2014--  back  . History of epilepsy     childhood age 81 to 59  ---secondary to high calcium deposts of optic nerve---  none since  age 56 with pregency  . OSA (obstructive sleep apnea)     mild-- no cpap  . IBS (irritable bowel syndrome)   . Seizure   . History of kidney stones    Past Surgical History  Procedure Laterality Date  . Bladder surgery  1991    bladder reconstruction of torsion  . Colonoscopy    . Polypectomy    . Upper gastrointestinal endoscopy    . Cataract extraction w/ intraocular lens  implant, bilateral    . Cholecystectomy  11-18-2003  . Cardiac catheterization  06-26-2001    NORMAL CORONARY ARTERIES  . Orif right bimalleolar ankle fx  12-18-2003  . 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 Lumbar disc surgery  1985  . Dilation and curettage of uterus  multiple -- last one 1975  . Vaginal hysterectomy  1975  . Hemorrhoid surgery  1970's  . Co2 laser application N/A 08/03/6710    Procedure: CO2 LASER APPLICATION;  Surgeon: Terrance Mass, MD;  Location: [facility];  Service: Gynecology;  Laterality: N/A;  CPT 57065  CO2 laser of vaginal and vulvar lesions.  . Back surgery    . Augmentation mammaplasty      SALINE  . Gynecologic cryosurgery     Family History  Problem Relation Age of Onset  . Heart disease  Mother   . Tuberous sclerosis Mother   . Tuberous sclerosis Sister   . Tuberous sclerosis Daughter   . Tuberous sclerosis Son   . Tuberous sclerosis Maternal Aunt   . Tuberous sclerosis Maternal Grandmother   . Tuberous sclerosis Maternal Aunt   . Tuberous sclerosis Maternal Aunt   . Tuberous sclerosis Cousin    History  Substance Use Topics  . Smoking status: Former Smoker -- 2.50 packs/day for 20 years    Types: Cigarettes    Quit date: 08/28/1992  . Smokeless tobacco: Never Used  . Alcohol Use: Yes     Comment: glass of wine occassional   OB History   Grav Para Term Preterm Abortions TAB SAB Ect Mult Living                 Review of Systems  Constitutional: Positive for fever.  HENT: Negative for  congestion.   Eyes: Negative for visual disturbance.  Respiratory: Positive for shortness of breath.   Cardiovascular: Negative for chest pain.  Gastrointestinal: Positive for nausea, vomiting and abdominal pain.  Genitourinary: Positive for dysuria, urgency, flank pain and difficulty urinating. Negative for hematuria.  Musculoskeletal: Positive for back pain.  Skin: Negative for rash.  Neurological: Negative for headaches.  Hematological: Does not bruise/bleed easily.  Psychiatric/Behavioral: Negative for confusion.      Allergies  Ciprofloxacin; Percocet; Zoloft; Contrast media; and Sulfa antibiotics  Home Medications   Prior to Admission medications   Medication Sig Start Date End Date Taking? Authorizing Provider  alendronate (FOSAMAX) 70 MG tablet Take 1 tablet (70 mg total) by mouth once a week. Take with a full glass of water on an empty stomach. 09/21/13   Terrance Mass, MD  cephALEXin (KEFLEX) 500 MG capsule 2 caps po bid x 7 days 10/16/13   [name], MD  cyanocobalamin (,VITAMIN B-12,) 1000 MCG/ML injection Inject 1 mL (1,000 mcg total) into the muscle once. 02/08/13   Rosalita Chessman, DO  ergocalciferol (VITAMIN D2) 50000 UNITS capsule Take one tablet every other week for 6 mos. 09/22/12   Terrance Mass, MD  HYDROcodone-acetaminophen (NORCO) 5-325 MG per tablet Take 2 tablets by mouth every 6 (six) hours as needed for severe pain. 10/16/13   [name], MD  HYDROcodone-acetaminophen (NORCO/VICODIN) 5-325 MG per tablet Take 1 tablet by mouth every 6 (six) hours as needed. For pain 09/14/13   Rosalita Chessman, DO  HYDROcodone-acetaminophen (NORCO/VICODIN) 5-325 MG per tablet Take 1-2 tablets by mouth every 6 (six) hours as needed for moderate pain. 11/12/13   Fredia Sorrow, MD  meloxicam (MOBIC) 15 MG tablet Take 15 mg by mouth daily.    Historical Provider, MD  metroNIDAZOLE (FLAGYL) 500 MG tablet Take 1 tablet (500 mg total) by mouth 2 (two) times daily. One po bid x 7  days 10/16/13   [name], MD  Naltrexone-Bupropion HCl ER (CONTRAVE) 8-90 MG TB12 1 po qam x1 week, then 1 po bid x 1 week then 2 po qam and 1 po qpm x 1 week then 2 po bid 08/31/13   Rosalita Chessman, DO  naproxen (NAPROSYN) 500 MG tablet Take 1 tablet (500 mg total) by mouth 2 (two) times daily. 11/12/13   Fredia Sorrow, MD  omeprazole (PRILOSEC) 40 MG capsule Take 40 mg by mouth daily.    Historical Provider, MD  ondansetron (ZOFRAN ODT) 4 MG disintegrating tablet Take 1 tablet (4 mg total) by mouth every 8 (eight) hours  as needed for nausea or vomiting. 11/12/13   Fredia Sorrow, MD  valACYclovir (VALTREX) 1000 MG tablet Take 1 tablet (1,000 mg total) by mouth 3 (three) times daily. 09/21/13   Terrance Mass, MD   BP 149/58  Pulse 98  Temp(Src) 97.7 F (36.5 C)  Resp 16  SpO2 98% Physical Exam  Nursing note and vitals reviewed. Constitutional: She is oriented to person, place, and time. She appears well-developed and well-nourished. No distress.  HENT:  Head: Normocephalic and atraumatic.  Mouth/Throat: Oropharynx is clear and moist.  Eyes: Conjunctivae and EOM are normal. Pupils are equal, round, and reactive to light.  Neck: Normal range of motion.  Cardiovascular: Normal rate, regular rhythm and normal heart sounds.   No murmur heard. Pulmonary/Chest: Effort normal and breath sounds normal. No respiratory distress.  Abdominal: Soft. Bowel sounds are normal. There is no tenderness.  Musculoskeletal: Normal range of motion.  Neurological: She is alert and oriented to person, place, and time. No cranial nerve deficit. She exhibits normal muscle tone. Coordination normal.  Skin: Skin is warm. No rash noted.    ED Course  Procedures (including critical care time) Labs Review Labs Reviewed  URINALYSIS, ROUTINE W REFLEX MICROSCOPIC - Abnormal; Notable for the following:    Specific Gravity, Urine 1.035 (*)    Bilirubin Urine SMALL (*)    Ketones, ur 15 (*)    All other  components within normal limits  BASIC METABOLIC PANEL - Abnormal; Notable for the following:    Potassium 3.3 (*)    Creatinine, Ser 1.20 (*)    GFR calc non Af Amer 46 (*)    GFR calc Af Amer 53 (*)    All other components within normal limits  URINE CULTURE  CBC WITH DIFFERENTIAL    Imaging Review Ct Abdomen Pelvis Wo Contrast  11/12/2013   CLINICAL DATA:  Back pain.  EXAM: CT ABDOMEN AND PELVIS WITHOUT CONTRAST  TECHNIQUE: Multidetector CT imaging of the abdomen and pelvis was performed following the standard protocol without IV contrast.  COMPARISON:  Lumbar spine series 220 2015. CT abdomen 06/21/2013. Chest CT 10/28/2008.  FINDINGS: Liver normal. Spleen normal. Splenosis. Pancreas unremarkable. No biliary distention. Surgical clips gallbladder fossa.  Adrenals normal. Stable left renal cysts. 6 mm stone proximal right ureter with moderate right hydronephrosis. Foley catheter within the bladder. The bladder is nondistended.  No adenopathy.  Aortoiliac atherosclerotic vascular calcification.  Appendix normal. Diverticulosis. No evidence of diverticulitis. No bowel distention. No gastric distention. Small sliding hiatal hernia. No mesenteric mass. Tiny umbilical hernia with herniation of fat only.  Bilateral breast implants. Stable 3 mm nodule right lung base, no change from prior CT of 10/28/2008, thus benign. Heart size normal. Multiple sclerotic lesions are again noted throughout the spine and pelvis. Blastic metastatic disease could present in this fashion.  IMPRESSION: 1. 6 mm stone proximal right ureter with moderate right hydronephrosis. Foley catheter in bladder. Bladder nondistended.  2. Again noted are multiple sclerotic bony lesions throughout the lumbar spine and pelvis.   Electronically Signed   By: Marcello Moores  Register   On: 11/12/2013 18:27     EKG Interpretation None      MDM   Final diagnoses:  Kidney stone  Right ureteral stone    Workup and findings consistent with a  right ureteral stone proximal with some mild hydronephrosis. No evidence of infection. Will treat with pain medication and nausea medicine. Referral information provided to urology. However patient has been followed in  the past by her regular Dr. for her prior kidney stones. No evidence urinary tract infection.  Here in the emergency department patient had Foley catheter placed about maybe there is urinary retention. Get very little bit of urine out one CT scan was done explanation was clearly related to the right kidney stone as were most of the symptoms were.  Fredia Sorrow, MD 11/12/13 1921

## 2013-11-14 LAB — URINE CULTURE
Colony Count: NO GROWTH
Culture: NO GROWTH

## 2013-11-15 ENCOUNTER — Ambulatory Visit (INDEPENDENT_AMBULATORY_CARE_PROVIDER_SITE_OTHER): Payer: Medicare Other | Admitting: Family Medicine

## 2013-11-15 ENCOUNTER — Encounter: Payer: Self-pay | Admitting: Family Medicine

## 2013-11-15 VITALS — BP 122/74 | HR 90 | Temp 99.0°F | Wt 203.0 lb

## 2013-11-15 DIAGNOSIS — R82998 Other abnormal findings in urine: Secondary | ICD-10-CM | POA: Diagnosis not present

## 2013-11-15 DIAGNOSIS — N200 Calculus of kidney: Secondary | ICD-10-CM | POA: Diagnosis not present

## 2013-11-15 LAB — POCT URINALYSIS DIPSTICK
Bilirubin, UA: NEGATIVE
GLUCOSE UA: NEGATIVE
Ketones, UA: NEGATIVE
Nitrite, UA: NEGATIVE
Spec Grav, UA: 1.01
UROBILINOGEN UA: 0.2
pH, UA: 5

## 2013-11-15 MED ORDER — HYDROCODONE-ACETAMINOPHEN 5-325 MG PO TABS: 2.0000 | ORAL_TABLET | Freq: Four times a day (QID) | ORAL | Status: DC | PRN

## 2013-11-15 MED ORDER — CIPROFLOXACIN HCL 250 MG PO TABS: 250.0000 mg | ORAL_TABLET | Freq: Two times a day (BID) | ORAL | Status: DC

## 2013-11-15 NOTE — Progress Notes (Signed)
Pre visit review using our clinic review tool, if applicable. No additional management support is needed unless otherwise documented below in the visit note. 

## 2013-11-15 NOTE — Progress Notes (Signed)
   Subjective:    Patient ID: [name], female    DOB: [birth year], 68 y.o.   MRN: [id number]  HPI Pt here f/u kidney stone.  She still has not passed it and is still causing pain.     Review of Systems As above     Objective:   Physical Exam   BP 122/74  Pulse 90  Temp(Src) 99 F (37.2 C) (Oral)  Wt 203 lb (92.08 kg)  SpO2 97% General appearance: alert, cooperative, appears stated age and mild distress Abdomen: abnormal findings:  moderate tenderness in the right flank      Assessment & Plan:  1. Kidney stone Refill pain meds-- if pain con't with pain med- go to ER - POCT Urinalysis Dipstick - Urine Culture - Ambulatory referral to Urology - HYDROcodone-acetaminophen ([place]) 5-325 MG per tablet; Take 2 tablets by mouth every 6 (six) hours as needed for severe pain.  Dispense: 30 tablet; Refill: 0  2. Leukocytes in urine   - POCT Urinalysis Dipstick - Urine Culture - ciprofloxacin (CIPRO) 250 MG tablet; Take 1 tablet (250 mg total) by mouth 2 (two) times daily.  Dispense: 6 tablet; Refill: 0

## 2013-11-15 NOTE — Patient Instructions (Signed)
Kidney Stones  Kidney stones (urolithiasis) are deposits that form inside your kidneys. The intense pain is caused by the stone moving through the urinary tract. When the stone moves, the ureter goes into spasm around the stone. The stone is usually passed in the urine.   CAUSES   · A disorder that makes certain neck glands produce too much parathyroid hormone (primary hyperparathyroidism).  · A buildup of uric acid crystals, similar to gout in your joints.  · Narrowing (stricture) of the ureter.  · A kidney obstruction present at birth (congenital obstruction).  · Previous surgery on the kidney or ureters.  · Numerous kidney infections.  SYMPTOMS   · Feeling sick to your stomach (nauseous).  · Throwing up (vomiting).  · Blood in the urine (hematuria).  · Pain that usually spreads (radiates) to the groin.  · Frequency or urgency of urination.  DIAGNOSIS   · Taking a history and physical exam.  · Blood or urine tests.  · CT scan.  · Occasionally, an examination of the inside of the urinary bladder (cystoscopy) is performed.  TREATMENT   · Observation.  · Increasing your fluid intake.  · Extracorporeal shock wave lithotripsy This is a noninvasive procedure that uses shock waves to break up kidney stones.  · Surgery may be needed if you have severe pain or persistent obstruction. There are various surgical procedures. Most of the procedures are performed with the use of small instruments. Only small incisions are needed to accommodate these instruments, so recovery time is minimized.  The size, location, and chemical composition are all important variables that will determine the proper choice of action for you. Talk to your health care provider to better understand your situation so that you will minimize the risk of injury to yourself and your kidney.   HOME CARE INSTRUCTIONS   · Drink enough water and fluids to keep your urine clear or pale yellow. This will help you to pass the stone or stone fragments.  · Strain  all urine through the provided strainer. Keep all particulate matter and stones for your health care provider to see. The stone causing the pain may be as small as a grain of salt. It is very important to use the strainer each and every time you pass your urine. The collection of your stone will allow your health care provider to analyze it and verify that a stone has actually passed. The stone analysis will often identify what you can do to reduce the incidence of recurrences.  · Only take over-the-counter or prescription medicines for pain, discomfort, or fever as directed by your health care provider.  · Make a follow-up appointment with your health care provider as directed.  · Get follow-up X-rays if required. The absence of pain does not always mean that the stone has passed. It may have only stopped moving. If the urine remains completely obstructed, it can cause loss of kidney function or even complete destruction of the kidney. It is your responsibility to make sure X-rays and follow-ups are completed. Ultrasounds of the kidney can show blockages and the status of the kidney. Ultrasounds are not associated with any radiation and can be performed easily in a matter of minutes.  SEEK MEDICAL CARE IF:  · You experience pain that is progressive and unresponsive to any pain medicine you have been prescribed.  SEEK IMMEDIATE MEDICAL CARE IF:   · Pain cannot be controlled with the prescribed medicine.  · You have a fever   or shaking chills.  · The severity or intensity of pain increases over 18 hours and is not relieved by pain medicine.  · You develop a new onset of abdominal pain.  · You feel faint or pass out.  · You are unable to urinate.  MAKE SURE YOU:   · Understand these instructions.  · Will watch your condition.  · Will get help right away if you are not doing well or get worse.  Document Released: 05/20/2005 Document Revised: 01/20/2013 Document Reviewed: 10/21/2012  [employer]® Patient Information ©2014  [employer].

## 2013-11-16 DIAGNOSIS — R82998 Other abnormal findings in urine: Secondary | ICD-10-CM | POA: Diagnosis not present

## 2013-11-16 DIAGNOSIS — N200 Calculus of kidney: Secondary | ICD-10-CM | POA: Diagnosis not present

## 2013-11-18 ENCOUNTER — Other Ambulatory Visit: Payer: Self-pay | Admitting: Urology

## 2013-11-18 DIAGNOSIS — R109 Unspecified abdominal pain: Secondary | ICD-10-CM | POA: Diagnosis not present

## 2013-11-18 DIAGNOSIS — R110 Nausea: Secondary | ICD-10-CM | POA: Diagnosis not present

## 2013-11-18 DIAGNOSIS — N201 Calculus of ureter: Secondary | ICD-10-CM | POA: Diagnosis not present

## 2013-11-18 DIAGNOSIS — R82998 Other abnormal findings in urine: Secondary | ICD-10-CM | POA: Diagnosis not present

## 2013-11-18 LAB — URINE CULTURE

## 2013-11-19 ENCOUNTER — Encounter (HOSPITAL_BASED_OUTPATIENT_CLINIC_OR_DEPARTMENT_OTHER): Payer: Self-pay | Admitting: *Deleted

## 2013-11-19 NOTE — Progress Notes (Signed)
To [facility] at 1315-Istat,Ekg on arrival-will take omeprazole with small amt water and pain med as needed.Instructed Npo after Mn of solid food- small amt clear liquids only till 0530,then Npo.

## 2013-11-22 ENCOUNTER — Other Ambulatory Visit: Payer: Self-pay

## 2013-11-22 ENCOUNTER — Encounter (HOSPITAL_BASED_OUTPATIENT_CLINIC_OR_DEPARTMENT_OTHER): Payer: Medicare Other | Admitting: Anesthesiology

## 2013-11-22 ENCOUNTER — Encounter (HOSPITAL_BASED_OUTPATIENT_CLINIC_OR_DEPARTMENT_OTHER): Payer: Self-pay | Admitting: Anesthesiology

## 2013-11-22 ENCOUNTER — Ambulatory Visit (HOSPITAL_BASED_OUTPATIENT_CLINIC_OR_DEPARTMENT_OTHER)
Admission: RE | Admit: 2013-11-22 | Discharge: 2013-11-22 | Disposition: A | Discharge status: Home / Self Care | Payer: Medicare Other | Source: Ambulatory Visit | Attending: Urology | Admitting: Urology

## 2013-11-22 ENCOUNTER — Ambulatory Visit (HOSPITAL_BASED_OUTPATIENT_CLINIC_OR_DEPARTMENT_OTHER): Payer: Medicare Other | Admitting: Anesthesiology

## 2013-11-22 ENCOUNTER — Encounter (HOSPITAL_BASED_OUTPATIENT_CLINIC_OR_DEPARTMENT_OTHER)
Admission: RE | Disposition: A | Discharge status: Home / Self Care | Payer: Medicare Other | Source: Ambulatory Visit | Attending: Urology

## 2013-11-22 DIAGNOSIS — G4733 Obstructive sleep apnea (adult) (pediatric): Secondary | ICD-10-CM | POA: Insufficient documentation

## 2013-11-22 DIAGNOSIS — N135 Crossing vessel and stricture of ureter without hydronephrosis: Secondary | ICD-10-CM | POA: Diagnosis not present

## 2013-11-22 DIAGNOSIS — N200 Calculus of kidney: Secondary | ICD-10-CM

## 2013-11-22 DIAGNOSIS — K449 Diaphragmatic hernia without obstruction or gangrene: Secondary | ICD-10-CM | POA: Diagnosis not present

## 2013-11-22 DIAGNOSIS — Z87891 Personal history of nicotine dependence: Secondary | ICD-10-CM | POA: Diagnosis not present

## 2013-11-22 DIAGNOSIS — K219 Gastro-esophageal reflux disease without esophagitis: Secondary | ICD-10-CM | POA: Diagnosis not present

## 2013-11-22 DIAGNOSIS — N201 Calculus of ureter: Secondary | ICD-10-CM | POA: Diagnosis not present

## 2013-11-22 DIAGNOSIS — Z882 Allergy status to sulfonamides status: Secondary | ICD-10-CM | POA: Diagnosis not present

## 2013-11-22 HISTORY — PX: CYSTOSCOPY WITH RETROGRADE PYELOGRAM, URETEROSCOPY AND STENT PLACEMENT: SHX5789

## 2013-11-22 LAB — POCT I-STAT 4, (NA,K, GLUC, HGB,HCT)
Glucose, Bld: 92 mg/dL (ref 70–99)
HCT: 45 % (ref 36.0–46.0)
Hemoglobin: 15.3 g/dL — ABNORMAL HIGH (ref 12.0–15.0)
POTASSIUM: 3.8 meq/L (ref 3.7–5.3)
SODIUM: 140 meq/L (ref 137–147)

## 2013-11-22 SURGERY — CYSTOURETEROSCOPY, WITH RETROGRADE PYELOGRAM AND STENT INSERTION
Anesthesia: General | Site: Ureter | Laterality: Right

## 2013-11-22 MED ORDER — SENNOSIDES-DOCUSATE SODIUM 8.6-50 MG PO TABS: 1.0000 | ORAL_TABLET | Freq: Two times a day (BID) | ORAL | Status: DC

## 2013-11-22 MED ORDER — STERILE WATER FOR IRRIGATION IR SOLN
Status: DC | PRN
  Administered 2013-11-22: 500 mL

## 2013-11-22 MED ORDER — LIDOCAINE HCL 2 % EX GEL
CUTANEOUS | Status: DC | PRN
  Administered 2013-11-22: 1 via URETHRAL

## 2013-11-22 MED ORDER — MIDAZOLAM HCL 2 MG/2ML IJ SOLN
INTRAMUSCULAR | Status: AC
  Filled 2013-11-22: qty 2

## 2013-11-22 MED ORDER — LACTATED RINGERS IV SOLN
INTRAVENOUS | Status: DC
  Administered 2013-11-22 (×2): via INTRAVENOUS
  Filled 2013-11-22: qty 1000

## 2013-11-22 MED ORDER — BELLADONNA ALKALOIDS-OPIUM 16.2-60 MG RE SUPP
RECTAL | Status: AC
  Filled 2013-11-22: qty 1

## 2013-11-22 MED ORDER — OXYBUTYNIN CHLORIDE 5 MG PO TABS: 5.0000 mg | ORAL_TABLET | Freq: Four times a day (QID) | ORAL | Status: DC | PRN

## 2013-11-22 MED ORDER — LIDOCAINE HCL (CARDIAC) 20 MG/ML IV SOLN
INTRAVENOUS | Status: DC | PRN
  Administered 2013-11-22: 80 mg via INTRAVENOUS

## 2013-11-22 MED ORDER — CEFAZOLIN SODIUM-DEXTROSE 2-3 GM-% IV SOLR
2.0000 g | INTRAVENOUS | Status: AC
  Administered 2013-11-22: 2 g via INTRAVENOUS
  Filled 2013-11-22: qty 50

## 2013-11-22 MED ORDER — ACETAMINOPHEN 10 MG/ML IV SOLN
INTRAVENOUS | Status: DC | PRN
  Administered 2013-11-22: 1000 mg via INTRAVENOUS

## 2013-11-22 MED ORDER — PROPOFOL INFUSION 10 MG/ML OPTIME
INTRAVENOUS | Status: DC | PRN
  Administered 2013-11-22: 150 mL via INTRAVENOUS

## 2013-11-22 MED ORDER — ONDANSETRON HCL 4 MG/2ML IJ SOLN
INTRAMUSCULAR | Status: DC | PRN
  Administered 2013-11-22: 4 mg via INTRAVENOUS

## 2013-11-22 MED ORDER — PHENAZOPYRIDINE HCL 100 MG PO TABS
ORAL_TABLET | ORAL | Status: AC
  Filled 2013-11-22: qty 2

## 2013-11-22 MED ORDER — OXYBUTYNIN CHLORIDE 5 MG PO TABS
5.0000 mg | ORAL_TABLET | Freq: Once | ORAL | Status: AC
  Administered 2013-11-22: 5 mg via ORAL
  Filled 2013-11-22: qty 1

## 2013-11-22 MED ORDER — MEPERIDINE HCL 25 MG/ML IJ SOLN
6.2500 mg | INTRAMUSCULAR | Status: DC | PRN
  Filled 2013-11-22: qty 1

## 2013-11-22 MED ORDER — PHENAZOPYRIDINE HCL 200 MG PO TABS
200.0000 mg | ORAL_TABLET | Freq: Once | ORAL | Status: AC
  Administered 2013-11-22: 200 mg via ORAL
  Filled 2013-11-22: qty 1

## 2013-11-22 MED ORDER — DEXAMETHASONE SODIUM PHOSPHATE 10 MG/ML IJ SOLN
INTRAMUSCULAR | Status: DC | PRN
  Administered 2013-11-22: 10 mg via INTRAVENOUS

## 2013-11-22 MED ORDER — PROMETHAZINE HCL 25 MG/ML IJ SOLN
6.2500 mg | INTRAMUSCULAR | Status: DC | PRN
  Filled 2013-11-22: qty 1

## 2013-11-22 MED ORDER — SODIUM CHLORIDE 0.9 % IR SOLN
Status: DC | PRN
  Administered 2013-11-22: 3000 mL via INTRAVESICAL

## 2013-11-22 MED ORDER — LACTATED RINGERS IV SOLN
INTRAVENOUS | Status: DC
  Filled 2013-11-22: qty 1000

## 2013-11-22 MED ORDER — FENTANYL CITRATE 0.05 MG/ML IJ SOLN
INTRAMUSCULAR | Status: AC
  Filled 2013-11-22: qty 4

## 2013-11-22 MED ORDER — IOHEXOL 350 MG/ML SOLN
INTRAVENOUS | Status: DC | PRN
  Administered 2013-11-22: 6 mL via INTRAVENOUS

## 2013-11-22 MED ORDER — BELLADONNA ALKALOIDS-OPIUM 16.2-60 MG RE SUPP
RECTAL | Status: DC | PRN
  Administered 2013-11-22: 1 via RECTAL

## 2013-11-22 MED ORDER — OXYBUTYNIN CHLORIDE 5 MG PO TABS
ORAL_TABLET | ORAL | Status: AC
  Filled 2013-11-22: qty 1

## 2013-11-22 MED ORDER — SODIUM CHLORIDE 0.9 % IV SOLN: INTRAVENOUS | Status: DC | PRN

## 2013-11-22 MED ORDER — MIDAZOLAM HCL 5 MG/5ML IJ SOLN
INTRAMUSCULAR | Status: DC | PRN
  Administered 2013-11-22: 2 mg via INTRAVENOUS

## 2013-11-22 MED ORDER — CIPROFLOXACIN HCL 500 MG PO TABS: 500.0000 mg | ORAL_TABLET | Freq: Two times a day (BID) | ORAL | Status: DC

## 2013-11-22 MED ORDER — HYDROCODONE-ACETAMINOPHEN 5-325 MG PO TABS: 1.0000 | ORAL_TABLET | ORAL | Status: DC | PRN

## 2013-11-22 MED ORDER — FENTANYL CITRATE 0.05 MG/ML IJ SOLN
INTRAMUSCULAR | Status: DC | PRN
  Administered 2013-11-22 (×2): 50 ug via INTRAVENOUS

## 2013-11-22 MED ORDER — HYDROCODONE-ACETAMINOPHEN 5-325 MG PO TABS
1.0000 | ORAL_TABLET | Freq: Four times a day (QID) | ORAL | Status: DC | PRN
  Administered 2013-11-22: 1 via ORAL
  Filled 2013-11-22: qty 1

## 2013-11-22 MED ORDER — HYDROCODONE-ACETAMINOPHEN 5-325 MG PO TABS
ORAL_TABLET | ORAL | Status: AC
  Filled 2013-11-22: qty 1

## 2013-11-22 MED ORDER — PHENAZOPYRIDINE HCL 200 MG PO TABS: 200.0000 mg | ORAL_TABLET | Freq: Three times a day (TID) | ORAL | Status: DC | PRN

## 2013-11-22 MED ORDER — FENTANYL CITRATE 0.05 MG/ML IJ SOLN
25.0000 ug | INTRAMUSCULAR | Status: DC | PRN
  Filled 2013-11-22: qty 1

## 2013-11-22 SURGICAL SUPPLY — 19 items
BAG DRAIN URO-CYSTO SKYTR STRL (DRAIN) ×4 IMPLANT
BAG DRN UROCATH (DRAIN) ×2
CANISTER SUCT LVC 12 LTR MEDI- (MISCELLANEOUS) ×4 IMPLANT
CATH URET 5FR 28IN OPEN ENDED (CATHETERS) ×4 IMPLANT
CATH URET DUAL LUMEN 6-10FR 50 (CATHETERS) ×3 IMPLANT
CLOTH BEACON ORANGE TIMEOUT ST (SAFETY) ×4 IMPLANT
DRAPE CAMERA CLOSED 9X96 (DRAPES) ×4 IMPLANT
GLOVE BIO SURGEON STRL SZ 6 (GLOVE) ×3 IMPLANT
GLOVE BIO SURGEON STRL SZ7 (GLOVE) ×4 IMPLANT
GLOVE ECLIPSE 7.0 STRL STRAW (GLOVE) ×4 IMPLANT
GLOVE INDICATOR 6.5 STRL GRN (GLOVE) ×6 IMPLANT
GLOVE INDICATOR 7.5 STRL GRN (GLOVE) ×4 IMPLANT
GOWN STRL REUS W/TWL LRG LVL3 (GOWN DISPOSABLE) ×3 IMPLANT
GOWN STRL REUS W/TWL XL LVL3 (GOWN DISPOSABLE) ×3 IMPLANT
GUIDEWIRE STR DUAL SENSOR (WIRE) ×7 IMPLANT
IV NS IRRIG 3000ML ARTHROMATIC (IV SOLUTION) ×4 IMPLANT
PACK CYSTOSCOPY (CUSTOM PROCEDURE TRAY) ×4 IMPLANT
STENT POLARIS 5FRX24 (STENTS) ×3 IMPLANT
WATER STERILE IRR 500ML POUR (IV SOLUTION) ×3 IMPLANT

## 2013-11-22 NOTE — Discharge Instructions (Addendum)
DISCHARGE INSTRUCTIONS FOR KIDNEY STONES OR URETERAL STENT ° °MEDICATIONS:  ° °1.  Resume all your other meds from home. ° °ACTIVITY °1. No strenuous activity x 1week °2. No driving while on narcotic pain medications °3. Drink plenty of water °4. Continue to walk at home - you can still get blood clots when you are at home, so keep active, but don't over do it. °5. May return to work in 3 days. ° °BATHING °1. You can shower or take a bath. ° ° ° °SIGNS/SYMPTOMS TO CALL: °1. Please call us if you have a fever greater than 101.5, uncontrolled  °nausea/vomiting, uncontrolled pain, dizziness, unable to urinate, chest pain, shortness of breath, leg swelling, leg pain, redness around wound, drainage from wound, or any other concerns or questions. ° °You can reach us at [phone]. ° ° °Post Anesthesia Home Care Instructions ° °Activity: °Get plenty of rest for the remainder of the day. A responsible adult should stay with you for 24 hours following the procedure.  °For the next 24 hours, DO NOT: °-Drive a car °-Operate machinery °-Drink alcoholic beverages °-Take any medication unless instructed by your physician °-Make any legal decisions or sign important papers. ° °Meals: °Start with liquid foods such as gelatin or soup. Progress to regular foods as tolerated. Avoid greasy, spicy, heavy foods. If nausea and/or vomiting occur, drink only clear liquids until the nausea and/or vomiting subsides. Call your physician if vomiting continues. ° °Special Instructions/Symptoms: °Your throat may feel dry or sore from the anesthesia or the breathing tube placed in your throat during surgery. If this causes discomfort, gargle with warm salt water. The discomfort should disappear within 24 hours. ° ° °

## 2013-11-22 NOTE — Op Note (Signed)
Urology Operative Report  Date of Procedure: 11/22/13  Surgeon: Rolan Bucco, MD Assistant:  None  Preoperative Diagnosis: Right ureter stone. Postoperative Diagnosis: Right ureter stone. Right proximal ureter stenosis.  Procedure(s): Right ureteroscopy. Right ureter stent placement. Right retrograde pyelogram with interpretation. Cystoscopy.  Estimated blood loss: None  Specimen: None  Drains: None  Complications: None  Findings: Right proximal ureter stenosis. Filling defect proximal to right proximal ureter stenosis likely a ureter stone.  History of present illness: 68 year old female presented to clinic with right ureter stone. She elected to proceed with attempted right ureteroscopy today.   Procedure in detail: After informed consent was obtained, the patient was taken to the operating room. They were placed in the supine position. SCDs were turned on and in place. IV antibiotics were infused, and general anesthesia was induced. A timeout was performed in which the correct patient, surgical site, and procedure were identified and agreed upon by the team.  The patient was placed in a dorsolithotomy position, making sure to pad all pertinent neurovascular pressure points. A belladonna and opium suppository was placed into the rectum. The genitals were prepped and draped in the usual sterile fashion.  A rigid cystoscope was advanced through the urethra and into the bladder. The bladder was drained and then fully distended and evaluated in a systematic fashion to visualize the entire surface of the bladder. This was negative for tumors.  Attention was turned to the right ureter orifice. I obtained a right retrograde pyelogram by cannulating the right ureter orifice with a 5 French ureter catheter and injecting 8 cc of Omnipaque. This revealed a filling defect with some stenosis in the proximal ureter and some mild right-sided hydronephrosis. This side did not drain  well.  I then placed a sensor wire up the right ureter under fluoroscopy beyond the stone into the right renal pelvis. The bladder was drained and the cystoscope was removed. I then placed a second sensor wire through the dual-lumen ureter access sheath after this was placed into the distal ureter. The dual-lumen ureter access sheath was removed and one wire secured as a safety wire one that was used as a working wire.  I placed the flexible digital ureter scope over the wire under fluoroscopy into the mid ureter. The working wire was then removed and I was able to navigate to the proximal ureter where I encountered an area of stenosis. This is very tight and would not allow the ureter scope to navigate beyond this. I replaced a second sensor wire and withdrew the ureter scope. I visualized the entire surface of the ureter and there was no injury to the ureter mucosa.  The ureter access sheath would not go beyond the area of stenosis and therefore elected to place a stent and returned for a staged ureteroscopy.  The working wire and ureteroscope were removed leaving the safety wire. The cystoscope was threaded over the safety wire and a 5 x 24 Polaris stent was placed over the safety wire, through the cystoscope, and up the ureter under fluoroscopy. This was deployed with a good curl in the right renal pelvis and the loops was in the bladder. The bladder was drained, cystoscope was removed, and I placed 10 cc of lidocaine jelly into the urethra.  She was placed back in a supine position, anesthesia was reversed, and she was taken to the PACU in stable condition.  All counts were correct at the end of the case.  She'll be scheduled for staged right  ureteroscopy. She will start on ciprofloxacin a few days before the surgery.

## 2013-11-22 NOTE — Anesthesia Preprocedure Evaluation (Signed)
Anesthesia Evaluation  Patient identified by MRN, date of birth, ID band Patient awake    Reviewed: Allergy & Precautions, H&P , NPO status , Patient's Chart, lab work & pertinent test results  Airway Mallampati: II TM Distance: >3 FB Neck ROM: Full    Dental  (+) Teeth Intact, Chipped, Dental Advisory Given,    Pulmonary neg pulmonary ROS, sleep apnea , former smoker,  breath sounds clear to auscultation  Pulmonary exam normal       Cardiovascular negative cardio ROS  Rhythm:Regular Rate:Normal     Neuro/Psych Seizures -, Well Controlled,  negative psych ROS   GI/Hepatic Neg liver ROS, hiatal hernia, GERD-  Medicated,  Endo/Other  negative endocrine ROS  Renal/GU negative Renal ROS  negative genitourinary   Musculoskeletal negative musculoskeletal ROS (+)   Abdominal   Peds  Hematology negative hematology ROS (+)   Anesthesia Other Findings   Reproductive/Obstetrics                           Anesthesia Physical  Anesthesia Plan  ASA: II  Anesthesia Plan: General   Post-op Pain Management:    Induction: Intravenous  Airway Management Planned: Oral ETT and LMA  Additional Equipment:   Intra-op Plan:   Post-operative Plan: Extubation in OR  Informed Consent: I have reviewed the patients History and Physical, chart, labs and discussed the procedure including the risks, benefits and alternatives for the proposed anesthesia with the patient or authorized representative who has indicated his/her understanding and acceptance.   Dental advisory given  Plan Discussed with: CRNA  Anesthesia Plan Comments:         Anesthesia Quick Evaluation

## 2013-11-22 NOTE — Transfer of Care (Signed)
Immediate Anesthesia Transfer of Care Note  Patient: [name]  Procedure(s) Performed: Procedure(s): CYSTOSCOPY WITH RETROGRADE PYELOGRAM, URETEROSCOPY AND STENT PLACEMENT (Right)  Patient Location: PACU  Anesthesia Type:General  Level of Consciousness: awake, alert , oriented and patient cooperative  Airway & Oxygen Therapy: Patient Spontanous Breathing and Patient connected to nasal cannula oxygen  Post-op Assessment: Report given to PACU RN and Post -op Vital signs reviewed and stable  Post vital signs: Reviewed and stable  Complications: No apparent anesthesia complications

## 2013-11-22 NOTE — H&P (Signed)
Urology History and Physical Exam  CC: Right ureter stone.  HPI:  68 year old female presents today with a right ureter stone.  This is located in the proximal ureter.  It is 6 mm in size.  It is associated with flank pain and nausea.  We discussed risks, benefits, alternatives, and likelihood of achieving goals.  She presents today for cystoscopy, right ureteroscopy, laser lithotripsy, and right retrograde pyelogram as well as possible right ureter stent placement. Urine culture from 11/18/13 was negative for growth.  PMH: Past Medical History  Diagnosis Date  . Arthritis   . Diverticulosis   . GERD (gastroesophageal reflux disease)   . Osteoporosis   . Tuberous sclerosis     EXTERNAL LESIONS--  MAINLY HANDS  . Seasonal allergies   . Vulvar dysplasia   . H/O hiatal hernia   . History of diverticulitis of colon   . Nodule of right lung     BENIGN---  CT  10-28-2008  . History of gastric ulcer   . Shingles     march 2014--  back  . History of epilepsy     childhood age 40 to 35  ---secondary to high calcium deposts of optic nerve---  none since age 13 with pregency  . OSA (obstructive sleep apnea)     mild-- no cpap  . IBS (irritable bowel syndrome)   . History of kidney stones   . Seizure     40 years since last sz per pt    PSH: Past Surgical History  Procedure Laterality Date  . Bladder surgery  1991    bladder reconstruction of torsion  . Colonoscopy    . Polypectomy    . Upper gastrointestinal endoscopy    . Cataract extraction w/ intraocular lens  implant, bilateral    . Cholecystectomy  11-18-2003  . Cardiac catheterization  06-26-2001    NORMAL CORONARY ARTERIES  . Orif right bimalleolar ankle fx  12-18-2003  . 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 Lumbar disc surgery  1985  . Dilation and curettage of uterus  multiple -- last one 1975  . Vaginal hysterectomy  1975  . Hemorrhoid surgery  1970's  . Co2  laser application N/A 12/05/9447    Procedure: CO2 LASER APPLICATION;  Surgeon: Terrance Mass, MD;  Location: [facility];  Service: Gynecology;  Laterality: N/A;  CPT 57065  CO2 laser of vaginal and vulvar lesions.  . Back surgery    . Augmentation mammaplasty      SALINE  . Gynecologic cryosurgery      Allergies: Allergies  Allergen Reactions  . Percocet [Oxycodone-Acetaminophen] Nausea And Vomiting  . Zoloft [Sertraline Hcl]     Pt can not take SSRI  . Contrast Media [Iodinated Diagnostic Agents] Rash    States was oral contrast- not betadine based IV contrast  . Sulfa Antibiotics Rash    Medications: No prescriptions prior to admission     Social History: History   Social History  . Marital Status: Divorced    Spouse Name: N/A    Number of Children: N/A  . Years of Education: N/A   Occupational History  . driver Other   Social History Main Topics  . Smoking status: Former Smoker -- 2.50 packs/day for 20 years    Types: Cigarettes    Quit date: 08/28/1992  . Smokeless tobacco: Never Used  . Alcohol Use: Yes  Comment: glass of wine occassional  . Drug Use: No  . Sexual Activity: Not Currently    Partners: Male   Other Topics Concern  . Not on file   Social History Narrative   Exercise-no    Family History: Family History  Problem Relation Age of Onset  . Heart disease Mother   . Tuberous sclerosis Mother   . Tuberous sclerosis Sister   . Tuberous sclerosis Daughter   . Tuberous sclerosis Son   . Tuberous sclerosis Maternal Aunt   . Tuberous sclerosis Maternal Grandmother   . Tuberous sclerosis Maternal Aunt   . Tuberous sclerosis Maternal Aunt   . Tuberous sclerosis Cousin     Review of Systems: Positive: Right flank pain. Negative: Fever, SOB, or chest pain.  A further 10 point review of systems was negative except what is listed in the HPI.  Physical Exam: Filed Vitals:   11/22/13 1317  BP: 151/66  Pulse: 114   Temp: 98 F (36.7 C)  Resp: 16    General: No acute distress.  Awake. Head:  Normocephalic.  Atraumatic. ENT:  EOMI.  Mucous membranes moist Neck:  Supple.  No lymphadenopathy. CV:  S1 present. S2 present. Regular rate. Pulmonary: Equal effort bilaterally.  Clear to auscultation bilaterally. Abdomen: Soft.  Non- tender to palpation. Skin:  Normal turgor.  No visible rash. Extremity: No gross deformity of bilateral upper extremities.  No gross deformity of    bilateral lower extremities. Neurologic: Alert. Appropriate mood.    Studies:  No results found for this basename: HGB, WBC, PLT,  in the last 72 hours  No results found for this basename: NA, K, CL, CO2, BUN, CREATININE, CALCIUM, MAGNESIUM, GFRNONAA, GFRAA,  in the last 72 hours   No results found for this basename: PT, INR, APTT,  in the last 72 hours   No components found with this basename: ABG,     Assessment:  Right ureter stone.  Plan: To OR for cystoscopy, right ureteroscopy, laser lithotripsy, and right retrograde pyelogram, and right ureter stent placement.

## 2013-11-22 NOTE — Anesthesia Procedure Notes (Signed)
Procedure Name: LMA Insertion Date/Time: 11/22/2013 4:34 PM Performed by: Wanita Chamberlain Pre-anesthesia Checklist: Patient identified, Timeout performed, Emergency Drugs available, Suction available and Patient being monitored Patient Re-evaluated:Patient Re-evaluated prior to inductionOxygen Delivery Method: Circle system utilized Preoxygenation: Pre-oxygenation with 100% oxygen Intubation Type: IV induction Ventilation: Mask ventilation without difficulty LMA: LMA inserted LMA Size: 4.0 Number of attempts: 1 Placement Confirmation: breath sounds checked- equal and bilateral Tube secured with: Tape Dental Injury: Teeth and Oropharynx as per pre-operative assessment

## 2013-11-23 ENCOUNTER — Encounter (HOSPITAL_BASED_OUTPATIENT_CLINIC_OR_DEPARTMENT_OTHER): Payer: Self-pay | Admitting: Urology

## 2013-11-23 ENCOUNTER — Other Ambulatory Visit: Payer: Self-pay | Admitting: Urology

## 2013-11-23 NOTE — Anesthesia Postprocedure Evaluation (Signed)
  Anesthesia Post-op Note  Patient: [name]  Procedure(s) Performed: Procedure(s) (LRB): CYSTOSCOPY WITH RETROGRADE PYELOGRAM, URETEROSCOPY AND STENT PLACEMENT (Right)  Patient Location: PACU  Anesthesia Type: General  Level of Consciousness: awake and alert   Airway and Oxygen Therapy: Patient Spontanous Breathing  Post-op Pain: mild  Post-op Assessment: Post-op Vital signs reviewed, Patient's Cardiovascular Status Stable, Respiratory Function Stable, Patent Airway and No signs of Nausea or vomiting  Last Vitals:  Filed Vitals:   11/22/13 1846  BP: 142/77  Pulse: 100  Temp: 36.2 C  Resp: 18    Post-op Vital Signs: stable   Complications: No apparent anesthesia complications

## 2013-11-26 ENCOUNTER — Encounter (HOSPITAL_BASED_OUTPATIENT_CLINIC_OR_DEPARTMENT_OTHER): Payer: Self-pay | Admitting: *Deleted

## 2013-11-26 NOTE — Progress Notes (Signed)
NPO AFTER MN. ARRIVE AT 1000. CURRENT LAB RESULTS AND EKG IN CHART AND EPIC. WILL TAKE PRILOSEC AND DOS AND IF NEEDED TAKE PRN MEDS.

## 2013-11-30 ENCOUNTER — Encounter (HOSPITAL_BASED_OUTPATIENT_CLINIC_OR_DEPARTMENT_OTHER): Payer: Self-pay | Admitting: Urology

## 2013-12-01 ENCOUNTER — Ambulatory Visit (HOSPITAL_BASED_OUTPATIENT_CLINIC_OR_DEPARTMENT_OTHER): Payer: Medicare Other | Admitting: Anesthesiology

## 2013-12-01 ENCOUNTER — Encounter (HOSPITAL_BASED_OUTPATIENT_CLINIC_OR_DEPARTMENT_OTHER): Payer: Self-pay

## 2013-12-01 ENCOUNTER — Encounter (HOSPITAL_BASED_OUTPATIENT_CLINIC_OR_DEPARTMENT_OTHER): Payer: Medicare Other | Admitting: Anesthesiology

## 2013-12-01 ENCOUNTER — Encounter (HOSPITAL_BASED_OUTPATIENT_CLINIC_OR_DEPARTMENT_OTHER)
Admission: RE | Disposition: A | Discharge status: Home / Self Care | Payer: Self-pay | Source: Ambulatory Visit | Attending: Urology

## 2013-12-01 ENCOUNTER — Ambulatory Visit (HOSPITAL_BASED_OUTPATIENT_CLINIC_OR_DEPARTMENT_OTHER)
Admission: RE | Admit: 2013-12-01 | Discharge: 2013-12-01 | Disposition: A | Discharge status: Home / Self Care | Payer: Medicare Other | Source: Ambulatory Visit | Attending: Urology | Admitting: Urology

## 2013-12-01 DIAGNOSIS — Z87891 Personal history of nicotine dependence: Secondary | ICD-10-CM | POA: Diagnosis not present

## 2013-12-01 DIAGNOSIS — N200 Calculus of kidney: Secondary | ICD-10-CM | POA: Insufficient documentation

## 2013-12-01 DIAGNOSIS — M51379 Other intervertebral disc degeneration, lumbosacral region without mention of lumbar back pain or lower extremity pain: Secondary | ICD-10-CM | POA: Diagnosis not present

## 2013-12-01 DIAGNOSIS — N1330 Unspecified hydronephrosis: Secondary | ICD-10-CM | POA: Insufficient documentation

## 2013-12-01 DIAGNOSIS — N201 Calculus of ureter: Secondary | ICD-10-CM

## 2013-12-01 DIAGNOSIS — M5137 Other intervertebral disc degeneration, lumbosacral region: Secondary | ICD-10-CM | POA: Insufficient documentation

## 2013-12-01 DIAGNOSIS — G4730 Sleep apnea, unspecified: Secondary | ICD-10-CM | POA: Diagnosis not present

## 2013-12-01 DIAGNOSIS — N135 Crossing vessel and stricture of ureter without hydronephrosis: Secondary | ICD-10-CM | POA: Insufficient documentation

## 2013-12-01 DIAGNOSIS — K219 Gastro-esophageal reflux disease without esophagitis: Secondary | ICD-10-CM | POA: Insufficient documentation

## 2013-12-01 DIAGNOSIS — Z882 Allergy status to sulfonamides status: Secondary | ICD-10-CM | POA: Insufficient documentation

## 2013-12-01 DIAGNOSIS — Q6431 Congenital bladder neck obstruction: Secondary | ICD-10-CM | POA: Diagnosis not present

## 2013-12-01 HISTORY — PX: CYSTOSCOPY/RETROGRADE/URETEROSCOPY: SHX5316

## 2013-12-01 HISTORY — DX: Cyst of kidney, acquired: N28.1

## 2013-12-01 HISTORY — DX: Other intervertebral disc degeneration, lumbosacral region without mention of lumbar back pain or lower extremity pain: M51.379

## 2013-12-01 HISTORY — DX: Personal history of vulvar dysplasia: Z87.412

## 2013-12-01 HISTORY — DX: Obstructive sleep apnea (adult) (pediatric): G47.33

## 2013-12-01 HISTORY — PX: HOLMIUM LASER APPLICATION: SHX5852

## 2013-12-01 HISTORY — DX: Other intervertebral disc degeneration, lumbosacral region: M51.37

## 2013-12-01 SURGERY — CYSTOSCOPY/RETROGRADE/URETEROSCOPY
Anesthesia: General | Site: Ureter | Laterality: Right

## 2013-12-01 MED ORDER — IOHEXOL 350 MG/ML SOLN
INTRAVENOUS | Status: DC | PRN
Start: 2013-12-01 — End: 2013-12-01
  Administered 2013-12-01: 6 mL via INTRAVENOUS

## 2013-12-01 MED ORDER — FENTANYL CITRATE 0.05 MG/ML IJ SOLN
INTRAMUSCULAR | Status: AC
  Filled 2013-12-01: qty 2

## 2013-12-01 MED ORDER — FENTANYL CITRATE 0.05 MG/ML IJ SOLN
INTRAMUSCULAR | Status: DC | PRN
  Administered 2013-12-01 (×2): 50 ug via INTRAVENOUS

## 2013-12-01 MED ORDER — KETOROLAC TROMETHAMINE 30 MG/ML IJ SOLN
INTRAMUSCULAR | Status: DC | PRN
  Administered 2013-12-01: 30 mg via INTRAVENOUS

## 2013-12-01 MED ORDER — ONDANSETRON HCL 4 MG/2ML IJ SOLN
INTRAMUSCULAR | Status: DC | PRN
  Administered 2013-12-01: 4 mg via INTRAVENOUS

## 2013-12-01 MED ORDER — BELLADONNA ALKALOIDS-OPIUM 16.2-60 MG RE SUPP
RECTAL | Status: AC
  Filled 2013-12-01: qty 1

## 2013-12-01 MED ORDER — MIDAZOLAM HCL 5 MG/5ML IJ SOLN
INTRAMUSCULAR | Status: DC | PRN
  Administered 2013-12-01: 2 mg via INTRAVENOUS

## 2013-12-01 MED ORDER — CIPROFLOXACIN HCL 500 MG PO TABS: 500.0000 mg | ORAL_TABLET | Freq: Two times a day (BID) | ORAL | Status: DC

## 2013-12-01 MED ORDER — DEXAMETHASONE SODIUM PHOSPHATE 4 MG/ML IJ SOLN
INTRAMUSCULAR | Status: DC | PRN
  Administered 2013-12-01: 10 mg via INTRAVENOUS

## 2013-12-01 MED ORDER — STERILE WATER FOR IRRIGATION IR SOLN: Status: DC | PRN

## 2013-12-01 MED ORDER — BELLADONNA ALKALOIDS-OPIUM 16.2-60 MG RE SUPP
RECTAL | Status: DC | PRN
  Administered 2013-12-01: 1 via RECTAL

## 2013-12-01 MED ORDER — LIDOCAINE HCL 2 % EX GEL
CUTANEOUS | Status: DC | PRN
  Administered 2013-12-01: 1

## 2013-12-01 MED ORDER — PROPOFOL 10 MG/ML IV BOLUS
INTRAVENOUS | Status: DC | PRN
  Administered 2013-12-01: 200 mg via INTRAVENOUS

## 2013-12-01 MED ORDER — CEFAZOLIN SODIUM-DEXTROSE 2-3 GM-% IV SOLR
2.0000 g | INTRAVENOUS | Status: AC
  Administered 2013-12-01: 2 g via INTRAVENOUS
  Filled 2013-12-01: qty 50

## 2013-12-01 MED ORDER — SODIUM CHLORIDE 0.9 % IR SOLN
Status: DC | PRN
  Administered 2013-12-01: 6000 mL

## 2013-12-01 MED ORDER — LIDOCAINE HCL (CARDIAC) 20 MG/ML IV SOLN
INTRAVENOUS | Status: DC | PRN
  Administered 2013-12-01: 100 mg via INTRAVENOUS

## 2013-12-01 MED ORDER — LACTATED RINGERS IV SOLN
INTRAVENOUS | Status: DC
  Administered 2013-12-01: 10:00:00 via INTRAVENOUS
  Filled 2013-12-01: qty 1000

## 2013-12-01 MED ORDER — MIDAZOLAM HCL 2 MG/2ML IJ SOLN
INTRAMUSCULAR | Status: AC
  Filled 2013-12-01: qty 2

## 2013-12-01 MED ORDER — HYDROCODONE-ACETAMINOPHEN 5-325 MG PO TABS: 1.0000 | ORAL_TABLET | ORAL | Status: DC | PRN

## 2013-12-01 MED ORDER — ACETAMINOPHEN 10 MG/ML IV SOLN
INTRAVENOUS | Status: DC | PRN
  Administered 2013-12-01: 1000 mg via INTRAVENOUS

## 2013-12-01 SURGICAL SUPPLY — 48 items
BAG DRAIN URO-CYSTO SKYTR STRL (DRAIN) ×4 IMPLANT
BAG DRN UROCATH (DRAIN) ×2
BASKET LASER NITINOL 1.9FR (BASKET) IMPLANT
BASKET STNLS GEMINI 4WIRE 3FR (BASKET) IMPLANT
BASKET STONE 1.7 NGAGE (UROLOGICAL SUPPLIES) ×3 IMPLANT
BASKET ZERO TIP NITINOL 2.4FR (BASKET) IMPLANT
BSKT STON RTRVL 120 1.9FR (BASKET)
BSKT STON RTRVL GEM 120X11 3FR (BASKET)
BSKT STON RTRVL ZERO TP 2.4FR (BASKET)
CANISTER SUCT LVC 12 LTR MEDI- (MISCELLANEOUS) ×4 IMPLANT
CATH INTERMIT  6FR 70CM (CATHETERS) IMPLANT
CATH URET 5FR 28IN CONE TIP (BALLOONS)
CATH URET 5FR 28IN OPEN ENDED (CATHETERS) IMPLANT
CATH URET 5FR 70CM CONE TIP (BALLOONS) IMPLANT
CATH URET DUAL LUMEN 6-10FR 50 (CATHETERS) ×3 IMPLANT
CLOTH BEACON ORANGE TIMEOUT ST (SAFETY) ×4 IMPLANT
DRAPE CAMERA CLOSED 9X96 (DRAPES) ×4 IMPLANT
ELECT REM PT RETURN 9FT ADLT (ELECTROSURGICAL)
ELECTRODE REM PT RTRN 9FT ADLT (ELECTROSURGICAL) IMPLANT
FIBER LASER FLEXIVA 200 (UROLOGICAL SUPPLIES) ×3 IMPLANT
FIBER LASER FLEXIVA 365 (UROLOGICAL SUPPLIES) IMPLANT
GLOVE BIO SURGEON STRL SZ7 (GLOVE) ×4 IMPLANT
GLOVE BIOGEL M STER SZ 6 (GLOVE) ×3 IMPLANT
GLOVE BIOGEL PI IND STRL 6.5 (GLOVE) ×1 IMPLANT
GLOVE BIOGEL PI INDICATOR 6.5 (GLOVE) ×2
GLOVE ECLIPSE 7.0 STRL STRAW (GLOVE) ×4 IMPLANT
GLOVE INDICATOR 7.5 STRL GRN (GLOVE) ×4 IMPLANT
GOWN PREVENTION PLUS LG XLONG (DISPOSABLE) ×1 IMPLANT
GOWN STRL REIN XL XLG (GOWN DISPOSABLE) ×1 IMPLANT
GOWN STRL REUS W/TWL LRG LVL3 (GOWN DISPOSABLE) ×3 IMPLANT
GOWN STRL REUS W/TWL XL LVL3 (GOWN DISPOSABLE) ×3 IMPLANT
GUIDEWIRE 0.038 PTFE COATED (WIRE) ×4 IMPLANT
GUIDEWIRE ANG ZIPWIRE 038X150 (WIRE) IMPLANT
GUIDEWIRE STR DUAL SENSOR (WIRE) ×7 IMPLANT
IV NS IRRIG 3000ML ARTHROMATIC (IV SOLUTION) ×9 IMPLANT
KIT BALLIN UROMAX 15FX10 (LABEL) IMPLANT
KIT BALLN UROMAX 15FX4 (MISCELLANEOUS) IMPLANT
KIT BALLN UROMAX 26 75X4 (MISCELLANEOUS)
NS IRRIG 500ML POUR BTL (IV SOLUTION) IMPLANT
PACK CYSTOSCOPY (CUSTOM PROCEDURE TRAY) ×4 IMPLANT
SET HIGH PRES BAL DIL (LABEL)
SHEATH ACCESS URETERAL 38CM (SHEATH) ×3 IMPLANT
SHEATH ACCESS URETERAL 54CM (SHEATH) IMPLANT
SHEATH URET ACCESS 12FR/35CM (UROLOGICAL SUPPLIES) IMPLANT
SHEATH URET ACCESS 12FR/55CM (UROLOGICAL SUPPLIES) IMPLANT
STENT POLARIS 5FRX26 (STENTS) ×3 IMPLANT
SYRINGE IRR TOOMEY STRL 70CC (SYRINGE) IMPLANT
WATER STERILE IRR 500ML POUR (IV SOLUTION) ×3 IMPLANT

## 2013-12-01 NOTE — Anesthesia Procedure Notes (Signed)
Procedure Name: LMA Insertion Date/Time: 12/01/2013 10:50 AM Performed by: Bethena Roys T Pre-anesthesia Checklist: Patient identified, Emergency Drugs available, Suction available and Patient being monitored Patient Re-evaluated:Patient Re-evaluated prior to inductionOxygen Delivery Method: Circle System Utilized Preoxygenation: Pre-oxygenation with 100% oxygen Intubation Type: IV induction Ventilation: Mask ventilation without difficulty LMA: LMA inserted LMA Size: 5.0 Number of attempts: 1 Airway Equipment and Method: bite block Placement Confirmation: positive ETCO2 Dental Injury: Teeth and Oropharynx as per pre-operative assessment

## 2013-12-01 NOTE — Anesthesia Postprocedure Evaluation (Signed)
  Anesthesia Post-op Note  Patient: Emmaclaire M Beltre  Procedure(s) Performed: Procedure(s) (LRB): CYSTOSCOPY, RIGHT RETROGRADE/RIGHT DIGITAL URETEROSCOPY, RIGHT URETERAL STENT EXCHANGE (Right) HOLMIUM LASER APPLICATION (Right)  Patient Location: PACU  Anesthesia Type: General  Level of Consciousness: awake and alert   Airway and Oxygen Therapy: Patient Spontanous Breathing  Post-op Pain: mild  Post-op Assessment: Post-op Vital signs reviewed, Patient's Cardiovascular Status Stable, Respiratory Function Stable, Patent Airway and No signs of Nausea or vomiting  Last Vitals:  Filed Vitals:   12/01/13 1230  BP:   Pulse: 85  Temp:   Resp: 15    Post-op Vital Signs: stable   Complications: No apparent anesthesia complications

## 2013-12-01 NOTE — H&P (Signed)
Urology History and Physical Exam  CC: Right ureter stone.  HPI:  68 year old female presents today for a ureter stone.  Is located in the right ureter.  His proximal location.  It is 6 mm in size.  Associated with flank pain and nausea.  She went to the operating room on 11/22/13 and had a stent placed.  She presents today for cystoscopy, staged right ureteroscopy, laser lithotripsy, right retrograde program, and right ureter stent exchange.  We have discussed the risks, benefits, alternatives, and likelihood of achieving goals.  Urine culture  11/18/48 was negative for growth.  PMH: Past Medical History  Diagnosis Date  . Arthritis   . Diverticulosis   . GERD (gastroesophageal reflux disease)   . Osteoporosis   . Tuberous sclerosis     EXTERNAL LESIONS--  MAINLY HANDS (INTERNAL SCLEROTIC BONY LESIONS LUMBAR & PELVIS PER CT 11/2013)  . Seasonal allergies   . H/O hiatal hernia   . History of diverticulitis of colon   . Nodule of right lung     BENIGN--- AND STABLE PER  CT  11/2013  . History of gastric ulcer   . History of epilepsy     childhood age 69 to 9  ---secondary to high calcium deposts of optic nerve---  none since age 77 with pregency  . IBS (irritable bowel syndrome)   . History of kidney stones   . History of shingles     MARCH 2014--  BACK AREA  . History of vulvar dysplasia     S/P  LASER ABLATION 2014  . Right ureteral stone   . Ureteral stenosis, right   . DDD (degenerative disc disease), lumbosacral   . Renal cyst, left     STABLE PER CT 11/2013  . Mild obstructive sleep apnea     SLEEP STUDY  11-19-2012--  NO CPAP RX (DID NOT MEET CR[facility]IRIA)    PSH: Past Surgical History  Procedure Laterality Date  . Bladder surgery  1991    bladder reconstruction of torsion  . Colonoscopy    . Upper gastrointestinal endoscopy    . Cataract extraction w/ intraocular lens  implant, bilateral    . Cholecystectomy  11-18-2003  . Cardiac catheterization  06-26-2001   NORMAL CORONARY ARTERIES  . Orif right bimalleolar ankle fx  12-18-2003  . 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 Lumbar disc surgery  1985  . Vaginal hysterectomy  1975  . Hemorrhoid surgery  1970's  . Co2 laser application N/A 0/0/9381    Procedure: CO2 LASER APPLICATION;  Surgeon: Terrance Mass, MD;  Location: [facility];  Service: Gynecology;  Laterality: N/A;CO2 laser of vaginal and vulvar lesions  . Gynecologic cryosurgery    . Dilation and curettage of uterus  multiple -- last one 1975  . Augmentation mammaplasty      SALINE  . Cystoscopy with retrograde pyelogram, ureteroscopy and stent placement Right 11/22/2013    Procedure: CYSTOSCOPY WITH RETROGRADE PYELOGRAM, URETEROSCOPY AND STENT PLACEMENT;  Surgeon: Sharyn Creamer, MD;  Location: [facility];  Service: Urology;  Laterality: Right;    Allergies: Allergies  Allergen Reactions  . Percocet [Oxycodone-Acetaminophen] Nausea And Vomiting  . Zoloft [Sertraline Hcl]     Pt can not take SSRI  . Contrast Media [Iodinated Diagnostic Agents] Rash    States was oral contrast- not betadine based IV contrast  . Sulfa Antibiotics Rash    Medications:  No prescriptions prior to admission     Social History: History   Social History  . Marital Status: Widowed    Spouse Name: N/A    Number of Children: N/A  . Years of Education: N/A   Occupational History  . driver Other   Social History Main Topics  . Smoking status: Former Smoker -- 2.50 packs/day for 20 years    Types: Cigarettes    Quit date: 08/28/1992  . Smokeless tobacco: Never Used  . Alcohol Use: Yes     Comment: glass of wine occassional  . Drug Use: No  . Sexual Activity: Not Currently    Partners: Male   Other Topics Concern  . Not on file   Social History Narrative   Exercise-no    Family History: Family History  Problem Relation Age of Onset  . Heart  disease Mother   . Tuberous sclerosis Mother   . Tuberous sclerosis Sister   . Tuberous sclerosis Daughter   . Tuberous sclerosis Son   . Tuberous sclerosis Maternal Aunt   . Tuberous sclerosis Maternal Grandmother   . Tuberous sclerosis Maternal Aunt   . Tuberous sclerosis Maternal Aunt   . Tuberous sclerosis Cousin     Review of Systems: Positive: Gross hematuria (once). Negative: Fever, SOB, or chest pain.  A further 10 point review of systems was negative except what is listed in the HPI.  Physical Exam: Filed Vitals:   12/01/13 0942  BP: 142/82  Pulse: 80  Temp: 96.9 F (36.1 C)  Resp: 16    General: No acute distress.  Awake. Head:  Normocephalic.  Atraumatic. ENT:  EOMI.  Mucous membranes moist Neck:  Supple.  No lymphadenopathy. CV:  S1 present. S2 present. Regular rate. Pulmonary: Equal effort bilaterally.  Clear to auscultation bilaterally. Abdomen: Soft.  Non- tender to palpation. Skin:  Normal turgor.  No visible rash. Extremity: No gross deformity of bilateral upper extremities.  No gross deformity of    bilateral lower extremities. Neurologic: Alert. Appropriate mood.    Studies:  No results found for this basename: HGB, WBC, PLT,  in the last 72 hours  No results found for this basename: NA, K, CL, CO2, BUN, CREATININE, CALCIUM, MAGNESIUM, GFRNONAA, GFRAA,  in the last 72 hours   No results found for this basename: PT, INR, APTT,  in the last 72 hours   No components found with this basename: ABG,     Assessment:  Right ureter stone.  Plan: To OR  for cystoscopy, staged right ureteroscopy, laser lithotripsy, right retrograde program, and right ureter stent exchange.

## 2013-12-01 NOTE — Op Note (Addendum)
Urology Operative Report  Date of Procedure: 12/01/13  Surgeon: Rolan Bucco, MD Assistant:  None  Preoperative Diagnosis: Right ureter stone. Right ureter stenosis. Postoperative Diagnosis: Right ureter stone. Right nephrolithiasis. Right ureter stenosis (proximal & distal)  Procedure(s): Rightt ureteroscopy with laserlithotripsy, stone removal, and right ureter stent placement (5 x 26 polaris without tether). Right ureteroscopy with stone removal of right kidney stone. Right retrograde pyelogram with interpretation. Right ureter stent removal. Cystoscopy.  Estimated blood loss: Minimal  Specimen: Stones sent to [facility] for chemical analysis.  Drains: None  Complications: None  Findings: Right proximal ureter stone. Right nephrolithiasis. Multiple Randall's plaques. Proximal and distal ureter stenosis which was nonobstructing.   History of present illness: 68 year old female presents today for staged right ureteroscopy for a right proximal ureter stone.   Procedure in detail: After informed consent was obtained, the patient was taken to the operating room. They were placed in the supine position. SCDs were turned on and in place. IV antibiotics were infused, and general anesthesia was induced. A timeout was performed in which the correct patient, surgical site, and procedure were identified and agreed upon by the team.  The patient was placed in a dorsolithotomy position, making sure to pad all pertinent neurovascular pressure points. A belladonna and opium suppository was placed into the rectum. The genitals were prepped and draped in the usual sterile fashion.  A rigid cystoscope was advanced through the urethra and into the bladder. A stent grasper pulled the stent to the urethra meatus and a sensor wire was placed through the stent up into the right renal pelvis on fluoroscopy the stent was removed.  I obtained a right retrograde pyelogram by placing a dual-lumen ureter  access sheath over the sensor wire into the distal ureter and injecting 7 cc of Omnipaque. This showed a filling defect in the proximal ureter with some stenosis as well some stenosis in the distal ureter. There was some hydronephrosis which did not drain well. I placed a second sensor wire up into the right renal pelvis on fluoroscopy and removed the dual-lumen ureter access sheath.  Prior to removing the dual-lumen though I did pass it through the area of stenosis in the distal ureter. I then secured one wire safety wire and used the other wire as a working wire. Over the working wire under fluoroscopy I placed the obturator to a 12/14 ureter access sheath into the mid ureter. I then was able to place the operator and sheath together over the wire under fluoroscopy into the proximal ureter. This would not pass into the renal pelvis due to another area of stenosis. I removed the obturator and working wire.  I then placed a flexible digital ureter scope up the access sheath into the proximal ureter. There was some area of stenosis but this was easily navigated with the ureter scope. I then entered the renal pelvis and evaluated all the calyces in a systematic fashion. There was noted be multiple Randall's plaques. In the upper renal calyx there was noted to be a small stone. This was grasped with a basket and removed.   While placing the access sheath pushed the proximal ureter stone into the lower pole kidney. This was grasped with a basket and taken to the upper pole. Lithotripsy was carried out with a 200  holmium laser filament at 0.5 J and 20 Hz. Once the stone was broken into small pieces all the pieces were removed.  I then withdrew the ureter access sheath and the  ureter and visualized the entire surface of the ureter. There was no injury to the ureter. Given the stenosis in the proximal and distal sections of the ureter elected to replace a stent.  Rigid cystoscope was loaded over the safety wire  and a 5 x 26 Polaris stent without tether was placed over the wire through the cystoscope and under fluoroscopy up into the right ureter. This was deployed with a good curl in the right ureter and the loose within the bladder. The bladder was drained, the cystoscope was removed, and I placed 10 cc of lidocaine jelly into the urethra.  This completed the procedure, anesthesia was reversed, she was placed in a supine position, and she was taken to the PACU in a stable condition.  She will be given Cipro to start the day before her stent removal in clinic.  All counts were correct at the end of the case.

## 2013-12-01 NOTE — Anesthesia Preprocedure Evaluation (Addendum)
Anesthesia Evaluation  Patient identified by MRN, date of birth, ID band Patient awake    Reviewed: Allergy & Precautions, H&P , NPO status , Patient's Chart, lab work & pertinent test results  Airway Mallampati: III TM Distance: >3 FB Neck ROM: full    Dental no notable dental hx. (+) Teeth Intact, Dental Advisory Given   Pulmonary sleep apnea , former smoker,  breath sounds clear to auscultation  Pulmonary exam normal       Cardiovascular Exercise Tolerance: Good Rhythm:regular Rate:Normal     Neuro/Psych Seizures -, Well Controlled,  negative neurological ROS  negative psych ROS   GI/Hepatic negative GI ROS, Neg liver ROS, GERD-  Medicated and Controlled,  Endo/Other  negative endocrine ROS  Renal/GU negative Renal ROS  negative genitourinary   Musculoskeletal   Abdominal   Peds  Hematology negative hematology ROS (+)   Anesthesia Other Findings   Reproductive/Obstetrics negative OB ROS                          Anesthesia Physical Anesthesia Plan  ASA: III  Anesthesia Plan: General   Post-op Pain Management:    Induction:   Airway Management Planned: LMA  Additional Equipment:   Intra-op Plan:   Post-operative Plan:   Informed Consent: I have reviewed the patients History and Physical, chart, labs and discussed the procedure including the risks, benefits and alternatives for the proposed anesthesia with the patient or authorized representative who has indicated his/her understanding and acceptance.   Dental Advisory Given  Plan Discussed with: CRNA and Surgeon  Anesthesia Plan Comments:         Anesthesia Quick Evaluation

## 2013-12-01 NOTE — Transfer of Care (Signed)
Immediate Anesthesia Transfer of Care Note  Patient: [name]  Procedure(s) Performed: Procedure(s) with comments: CYSTOSCOPY, RIGHT RETROGRADE/RIGHT DIGITAL URETEROSCOPY, RIGHT URETERAL STENT EXCHANGE (Right) - STAGED RIGHT URETEROSCOPY RIGHT URETER DILATION   HOLMIUM LASER APPLICATION (Right)  Patient Location: PACU  Anesthesia Type:General  Level of Consciousness: awake and oriented  Airway & Oxygen Therapy: Patient Spontanous Breathing and Patient connected to nasal cannula oxygen  Post-op Assessment: Report given to PACU RN  Post vital signs: Reviewed and stable  Complications: No apparent anesthesia complications

## 2013-12-01 NOTE — Discharge Instructions (Signed)
DISCHARGE INSTRUCTIONS FOR KIDNEY STONES OR URETERAL STENT  MEDICATIONS:   1. Start antibiotic, cipro, the day before you return to clinic.  2. Resume all your other meds from home.  ACTIVITY 1. No strenuous activity x 1week 2. No driving while on narcotic pain medications 3. Drink plenty of water 4. Continue to walk at home - you can still get blood clots when you are at home, so keep active, but don't over do it. 5. May return to work in 3 days.  BATHING 1. You can shower or take a bath.   SIGNS/SYMPTOMS TO CALL: 1. Please call us if you have a fever greater than 101.5, uncontrolled  nausea/vomiting, uncontrolled pain, dizziness, unable to urinate, chest pain, shortness of breath, leg swelling, leg pain, redness around wound, drainage from wound, or any other concerns or questions.  You can reach [place] at [phone].   Post Anesthesia Home Care Instructions  Activity: Get plenty of rest for the remainder of the day. A responsible adult should stay with you for 24 hours following the procedure.  For the next 24 hours, DO NOT: -Drive a car -Paediatric nurse -Drink alcoholic beverages -Take any medication unless instructed by your physician -Make any legal decisions or sign important papers.  Meals: Start with liquid foods such as gelatin or soup. Progress to regular foods as tolerated. Avoid greasy, spicy, heavy foods. If nausea and/or vomiting occur, drink only clear liquids until the nausea and/or vomiting subsides. Call your physician if vomiting continues.  Special Instructions/Symptoms: Your throat may feel dry or sore from the anesthesia or the breathing tube placed in your throat during surgery. If this causes discomfort, gargle with warm salt water. The discomfort should disappear within 24 hours. [facility] [phone] Post Ureteroscopy With or Without Stent Instructions  Definitions:  Ureter: The duct that transports urine from the kidney  to the bladder. Stent:   A plastic hollow tube that is placed into the ureter, from the kidney to the                 bladder to prevent the ureter from swelling shut.  GENERAL INSTRUCTIONS:  Despite the fact that no skin incisions were used, the area around the ureter and bladder is raw and irritated. The stent is a foreign body which will further irritate the bladder wall. This irritation is manifested by increased frequency of urination, both day and night, and by an increase in the urge to urinate. In some, the urge to urinate is present almost always. Sometimes the urge is strong enough that you may not be able to stop yourself from urinating. The only real cure is to remove the stent and then give time for the bladder wall to heal which can't be done until the danger of the ureter swelling shut has passed, which varies.  You may see some blood in your urine while the stent is in place and a few days afterwards. Do not be alarmed, even if the urine was clear for a while. Get off your feet and drink lots of fluids until clearing occurs. If you start to pass clots or don't improve, call us.  DIET: You may return to your normal diet immediately. Because of the raw surface of your bladder, alcohol, spicy foods, acid type foods and drinks with caffeine may cause irritation or frequency and should be used in moderation. To keep your urine flowing freely and to avoid constipation, drink plenty of fluids during the day (  8-10 glasses ). Tip: Avoid cranberry juice because it is very acidic.  ACTIVITY: Your physical activity doesn't need to be restricted. However, if you are very active, you may see some blood in your urine. We suggest that you reduce your activity under these circumstances until the bleeding has stopped.  BOWELS: It is important to keep your bowels regular during the postoperative period. Straining with bowel movements can cause bleeding. A bowel movement every other day is reasonable.  Use a mild laxative if needed, such as Milk of Magnesia 2-3 tablespoons, or 2 Dulcolax tablets. Call if you continue to have problems. If you have been taking narcotics for pain, before, during or after your surgery, you may be constipated. Take a laxative if necessary.   MEDICATION: You should resume your pre-surgery medications unless told not to. In addition you will often be given an antibiotic to prevent infection. These should be taken as prescribed until the bottles are finished unless you are having an unusual reaction to one of the drugs.  PROBLEMS YOU SHOULD REPORT TO [place]:  Fevers over 100.5 Fahrenheit.  Heavy bleeding, or clots ( See above notes about blood in urine ).  Inability to urinate.  Drug reactions ( hives, rash, nausea, vomiting, diarrhea ).  Severe burning or pain with urination that is not improving.  FOLLOW-UP: You will need a follow-up appointment to monitor your progress. Call for this appointment at the number listed above. Usually the first appointment will be about three to fourteen days after your surgery.

## 2013-12-02 ENCOUNTER — Encounter (HOSPITAL_BASED_OUTPATIENT_CLINIC_OR_DEPARTMENT_OTHER): Payer: Self-pay | Admitting: Urology

## 2013-12-07 ENCOUNTER — Ambulatory Visit: Payer: Medicare Other

## 2013-12-07 ENCOUNTER — Ambulatory Visit (INDEPENDENT_AMBULATORY_CARE_PROVIDER_SITE_OTHER): Payer: Medicare Other

## 2013-12-07 DIAGNOSIS — E538 Deficiency of other specified B group vitamins: Secondary | ICD-10-CM | POA: Diagnosis not present

## 2013-12-07 DIAGNOSIS — N201 Calculus of ureter: Secondary | ICD-10-CM | POA: Diagnosis not present

## 2013-12-07 DIAGNOSIS — R82998 Other abnormal findings in urine: Secondary | ICD-10-CM | POA: Diagnosis not present

## 2013-12-07 DIAGNOSIS — N200 Calculus of kidney: Secondary | ICD-10-CM | POA: Diagnosis not present

## 2013-12-07 MED ORDER — CYANOCOBALAMIN 1000 MCG/ML IJ SOLN
1000.0000 ug | Freq: Once | INTRAMUSCULAR | Status: AC
  Administered 2013-12-07: 1000 ug via INTRAMUSCULAR

## 2013-12-14 DIAGNOSIS — M5137 Other intervertebral disc degeneration, lumbosacral region: Secondary | ICD-10-CM | POA: Diagnosis not present

## 2013-12-14 DIAGNOSIS — IMO0002 Reserved for concepts with insufficient information to code with codable children: Secondary | ICD-10-CM | POA: Diagnosis not present

## 2013-12-20 ENCOUNTER — Telehealth: Payer: Self-pay | Admitting: Family Medicine

## 2013-12-20 NOTE — Telephone Encounter (Signed)
LMOM @ (5:15pm) informing the pt if she is feeling bad and her pulse is continue to be high she may need to go to an Urgent Care or ER to be elevated. Asked the pt to RTC.//AB/CMA

## 2013-12-20 NOTE — Telephone Encounter (Signed)
Patient called a second time to check on the status of a call back. Please advise.

## 2013-12-20 NOTE — Telephone Encounter (Signed)
Caller name: Lucina  Call back [phone]   Reason for call:  Pt thinks she is having BP issues.  Reading 169/78 yesterday, 126/100 this morning.  Heart rate is 100.  Pt's feet is swollen.  Pt is out of town and just needs advice.

## 2013-12-21 ENCOUNTER — Ambulatory Visit (INDEPENDENT_AMBULATORY_CARE_PROVIDER_SITE_OTHER): Payer: Medicare Other | Admitting: Family Medicine

## 2013-12-21 ENCOUNTER — Encounter: Payer: Self-pay | Admitting: Family Medicine

## 2013-12-21 VITALS — BP 148/84 | HR 97 | Temp 98.0°F | Resp 16 | Wt 210.0 lb

## 2013-12-21 DIAGNOSIS — R609 Edema, unspecified: Secondary | ICD-10-CM | POA: Diagnosis not present

## 2013-12-21 DIAGNOSIS — IMO0001 Reserved for inherently not codable concepts without codable children: Secondary | ICD-10-CM

## 2013-12-21 DIAGNOSIS — I499 Cardiac arrhythmia, unspecified: Secondary | ICD-10-CM | POA: Diagnosis not present

## 2013-12-21 DIAGNOSIS — R030 Elevated blood-pressure reading, without diagnosis of hypertension: Secondary | ICD-10-CM

## 2013-12-21 LAB — BASIC METABOLIC PANEL
BUN: 13 mg/dL (ref 6–23)
CO2: 23 mEq/L (ref 19–32)
Calcium: 8.7 mg/dL (ref 8.4–10.5)
Chloride: 104 mEq/L (ref 96–112)
Creatinine, Ser: 0.8 mg/dL (ref 0.4–1.2)
GFR: 79.62 mL/min (ref >60.00)
GLUCOSE: 83 mg/dL (ref 70–99)
POTASSIUM: 3.1 meq/L — AB (ref 3.5–5.1)
Sodium: 138 mEq/L (ref 135–145)

## 2013-12-21 LAB — BRAIN NATRIURETIC PEPTIDE: Pro B Natriuretic peptide (BNP): 78 pg/mL (ref 0.0–100.0)

## 2013-12-21 LAB — TSH: TSH: 0.45 u[IU]/mL (ref 0.35–4.50)

## 2013-12-21 MED ORDER — FUROSEMIDE 20 MG PO TABS: 20.0000 mg | ORAL_TABLET | Freq: Every day | ORAL | Status: DC

## 2013-12-21 NOTE — Telephone Encounter (Signed)
Agree w/ needing to be seen- hopefully pt will make appt since she is out of town

## 2013-12-21 NOTE — Progress Notes (Signed)
   Subjective:    Patient ID: [name], female    DOB: 09/07/1945, 68 y.o.   MRN: [id number]  HPI Elevated BP- pt has hx of this previously.  Pt had BP checked on Sunday (169/78, 158/114) when her legs were noted to be swollen.  Yesterday was 170/95.  No CP, SOB above baseline.  + HA.  Some blurry vision in R eye since starting Neurontin.  + edema bilaterally- painful.  Drinking 'at least 8-10 glasses a day'.  Pt is eating out at least 4x/week.  Ate bacon this AM.     Review of Systems For ROS see HPI     Objective:   Physical Exam  Vitals reviewed. Constitutional: She is oriented to person, place, and time. She appears well-developed and well-nourished. No distress.  HENT:  Head: Normocephalic and atraumatic.  Neck: Normal range of motion. Neck supple.  Cardiovascular: Normal rate, normal heart sounds and intact distal pulses.   Irregular rhythm- normal interrupted by short run of either premature beats or change in tempo  Pulmonary/Chest: Effort normal and breath sounds normal. No respiratory distress. She has no wheezes. She has no rales.  Musculoskeletal: She exhibits edema (1-2+ pitting edema of LEs bilaterally) and tenderness.  Lymphadenopathy:    She has no cervical adenopathy.  Neurological: She is alert and oriented to person, place, and time.  Skin: Skin is warm and dry. No erythema.          Assessment & Plan:

## 2013-12-21 NOTE — Assessment & Plan Note (Signed)
New.  Noted during PE.  EKG w/o obvious Afib or flutter.  Pt is asymptomatic.  If she continues to have abnormal rhythm, may need cards referral for formal evaluation.  Pt expressed understanding and is in agreement w/ plan.

## 2013-12-21 NOTE — Progress Notes (Signed)
Pre visit review using our clinic review tool, if applicable. No additional management support is needed unless otherwise documented below in the visit note. 

## 2013-12-21 NOTE — Telephone Encounter (Signed)
C/O:  Pt currently out of town.  1 hour away.  Reports pain and severe swelling of legs and feet.  Right leg larger than left leg. Some redness noted otherwise normal color and temperature.  Denies difficulty walking.  States her feet look like Fiona's feet from [employer].  BP elevated.  This morning BP- 178/111 and 170/114.  Denies chest pain, shortness of breath, headache, and weakness.    Advise:  Needs to be seen today.  Refused to be seen at [facility] or ER.  An appointment became available with Dr. Birdie Riddle while on the phone with patient.  Appointment scheduled with Dr. Birdie Riddle today at 1100 am.

## 2013-12-21 NOTE — Telephone Encounter (Signed)
Pt is calling back, forwarding call to RN per advice

## 2013-12-21 NOTE — Assessment & Plan Note (Signed)
New to provider.  Pt has hx of this previously.  Due to concurrent edema, will start Lasix and monitor both BP and swelling.  Will follow closely as pt may need additional BP agent.

## 2013-12-21 NOTE — Patient Instructions (Signed)
Follow up in 1 week to recheck swelling and BP Try and limit your sodium intake in the form of processed meats (lunch meat, hotdogs, bacon, ham) and restaurant food Continue to drink plenty of fluids Elevate your legs as much as possible while you are resting Start the Lasix 1 tab daily to improve your fluid retention There was no obvious abnormality on your EKG- this is good news! We'll notify you of your lab results and determine if there's another reason for the swelling Call with any questions or concerns Hang in there!

## 2013-12-21 NOTE — Assessment & Plan Note (Signed)
New.  Pt's sxs are notable today and uncomfortable for her.  Has hx of taking HCTZ which according to the chart she 'did not tolerate' but there is no additional info given.  Will get labs to r/o underlying metabolic abnormality- including BNP to r/o CHF due to slightly abnormal cardiac rhythm.  Start Lasix 20mg  daily, reviewed importance of low Na diet, elevation, and adequate hydration.  Will follow closely.

## 2013-12-22 ENCOUNTER — Ambulatory Visit: Payer: Self-pay | Admitting: Physician Assistant

## 2013-12-27 ENCOUNTER — Ambulatory Visit (INDEPENDENT_AMBULATORY_CARE_PROVIDER_SITE_OTHER): Payer: Medicare Other | Admitting: Family Medicine

## 2013-12-27 ENCOUNTER — Encounter: Payer: Self-pay | Admitting: Family Medicine

## 2013-12-27 ENCOUNTER — Other Ambulatory Visit: Payer: Self-pay | Admitting: General Practice

## 2013-12-27 ENCOUNTER — Telehealth: Payer: Self-pay

## 2013-12-27 VITALS — BP 130/84 | HR 75 | Temp 98.0°F | Resp 16 | Wt 199.4 lb

## 2013-12-27 DIAGNOSIS — R609 Edema, unspecified: Secondary | ICD-10-CM | POA: Diagnosis not present

## 2013-12-27 DIAGNOSIS — E559 Vitamin D deficiency, unspecified: Secondary | ICD-10-CM | POA: Diagnosis not present

## 2013-12-27 MED ORDER — POTASSIUM CHLORIDE CRYS ER 20 MEQ PO TBCR: 20.0000 meq | EXTENDED_RELEASE_TABLET | Freq: Every day | ORAL | Status: DC

## 2013-12-27 MED ORDER — VITAMIN D (ERGOCALCIFEROL) 1.25 MG (50000 UNIT) PO CAPS: 50000.0000 [IU] | ORAL_CAPSULE | ORAL | Status: DC

## 2013-12-27 NOTE — Progress Notes (Signed)
Pre visit review using our clinic review tool, if applicable. No additional management support is needed unless otherwise documented below in the visit note. 

## 2013-12-27 NOTE — Patient Instructions (Signed)
Follow up w/ Dr Etter Sjogren in 1 month to recheck BP Continue the Lasix daily We'll notify you of your lab results START the potassium supplement daily Restart the Vit D weekly Call with any questions or concerns Hang in there!

## 2013-12-27 NOTE — Telephone Encounter (Signed)
Pt walked into clinic stating that someone called her regarding her lab results.  The following notes were noted in patient's chart regarding recent labs:  Notes Recorded by Kris Hartmann, CMA on 12/22/2013 at 3:48 PM Called pt and lmovm to return call.  Notes Recorded by Midge Minium, MD on 12/21/2013 at 7:18 PM K+ is already low and pt started Lasix today. Needs to start [place] 58meq daily and increase dietary intake in the form of leafy greens, citrus fruits, bananas Remainder of labs look good! No evidence of heart failure   The above information was reviewed with patient.   Pt stated understanding and agreed with plan.  Kdur 20 meq daily was e-scribed to [facility] on [address].  Pt is aware.

## 2013-12-27 NOTE — Progress Notes (Signed)
   Subjective:    Patient ID: [name], female    DOB: 1946-03-17, 68 y.o.   MRN: [id number]  HPI Edema- started Lasix at last visit.  Has lost 11 lbs of fluid.  Pt was hypokalemic at last visit but would not call back so did not start supplement yet.  Pt reports swelling is much less of an issue.  No SOB, no CP.  BP is stable today.  Vit D deficiency- pt's level was 27.7 at [facility].  Pt was previously on 50,000 units weekly.  Would like to restart.   Review of Systems For ROS see HPI     Objective:   Physical Exam  Constitutional: She is oriented to person, place, and time. She appears well-developed and well-nourished. No distress.  Cardiovascular: Normal rate, regular rhythm, normal heart sounds and intact distal pulses.   Pulmonary/Chest: Breath sounds normal. No respiratory distress. She has no wheezes. She has no rales.  Musculoskeletal: She exhibits edema (trace edema bilaterally) and tenderness (TTP over bilateral LEs consistent w/ fibromyalgia).  Neurological: She is alert and oriented to person, place, and time.          Assessment & Plan:

## 2013-12-28 ENCOUNTER — Telehealth: Payer: Self-pay | Admitting: Family Medicine

## 2013-12-28 NOTE — Telephone Encounter (Signed)
Patient was seen by Dr.Tabori, but it does not look like her Results are back.     KP

## 2013-12-28 NOTE — Assessment & Plan Note (Signed)
Improved since starting lasix.  Recheck K+ today as pt was low at last check but did not respond to our calls.  Plan is to continue lasix at this time and f/u w/ PCP.  Pt expressed understanding and is in agreement w/ plan.

## 2013-12-28 NOTE — Assessment & Plan Note (Signed)
New.  Noted on labs done at [facility].  Start 50,000 units weekly x12 weeks to replete.  Pt expressed understanding and is in agreement w/ plan.

## 2013-12-28 NOTE — Telephone Encounter (Signed)
Caller name: [name] Relation to XN:ATFT Call back [phone] Pharmacy:  Reason for call: pt would like her potassium results

## 2013-12-29 NOTE — Telephone Encounter (Signed)
No results available to review yet

## 2013-12-30 ENCOUNTER — Telehealth: Payer: Self-pay | Admitting: Family Medicine

## 2013-12-30 NOTE — Telephone Encounter (Addendum)
Pt states she starting to experience muscle cramping in hands, legs, arms, and ankles.  Cramping started yesterday at work and woke her up last night in her sleep.  Pt would like to know what she should do next.   Pt is taking 20 mg of Lasix and 20 MEQ of Potassium daily.  Last K+ level:  3.1 L  Pt was encouraged to increase her intake of of leafy greens, citrus fruits, and bananas.  Pt stated that she has been.  Please advise.

## 2013-12-30 NOTE — Telephone Encounter (Signed)
We need to check her K

## 2013-12-30 NOTE — Telephone Encounter (Signed)
Caller name: Jenny Reichmann  Call back number:[phone]   Reason for call:  Having medication concerns regarding lasix and potassium

## 2013-12-31 NOTE — Telephone Encounter (Signed)
No result in the system for BMP done on 12/27/13 will make Sheena aware.    KP

## 2014-01-04 ENCOUNTER — Telehealth: Payer: Self-pay | Admitting: Family Medicine

## 2014-01-04 ENCOUNTER — Ambulatory Visit (INDEPENDENT_AMBULATORY_CARE_PROVIDER_SITE_OTHER): Payer: Medicare Other | Admitting: *Deleted

## 2014-01-04 DIAGNOSIS — E538 Deficiency of other specified B group vitamins: Secondary | ICD-10-CM | POA: Diagnosis not present

## 2014-01-04 DIAGNOSIS — E876 Hypokalemia: Secondary | ICD-10-CM

## 2014-01-04 MED ORDER — CYANOCOBALAMIN 1000 MCG/ML IJ SOLN
1000.0000 ug | Freq: Once | INTRAMUSCULAR | Status: AC
  Administered 2014-01-04: 1000 ug via INTRAMUSCULAR

## 2014-01-04 NOTE — Telephone Encounter (Signed)
Please advise      KP 

## 2014-01-04 NOTE — Telephone Encounter (Signed)
Caller name: Miangel Relation to pt: Call back number:[phone] Pharmacy: [id number] [place], [place]   Reason for call: pt states her hands and feet are still cramping. She's taking 2 potassium pills per day and wants to know if that's okay and if she runs out of medication, will you authorize an early re-fill? The 2 pills are helping but not total relief.

## 2014-01-04 NOTE — Progress Notes (Signed)
Pt came in for her monthly Vit B12 injection.  Pt tolerated injection well.  Next injection in one month pt will schedule an appt.//AB/CMA

## 2014-01-04 NOTE — Telephone Encounter (Signed)
We need to recheck bmp----  This week   Dx  hypokalemia

## 2014-01-04 NOTE — Telephone Encounter (Signed)
patient aware and voiced understanding. Apt scheduled for Friday at 3 pm.,      KP

## 2014-01-05 NOTE — Progress Notes (Signed)
Kim had discussed this with lowne.

## 2014-01-07 ENCOUNTER — Other Ambulatory Visit: Payer: Self-pay

## 2014-01-07 ENCOUNTER — Other Ambulatory Visit: Payer: Medicare Other

## 2014-01-07 DIAGNOSIS — E876 Hypokalemia: Secondary | ICD-10-CM

## 2014-01-08 LAB — BASIC METABOLIC PANEL
BUN: 25 mg/dL — AB (ref 6–23)
CHLORIDE: 103 meq/L (ref 96–112)
CO2: 19 mEq/L (ref 19–32)
Calcium: 9.7 mg/dL (ref 8.4–10.5)
Creat: 0.98 mg/dL (ref 0.50–1.10)
GLUCOSE: 98 mg/dL (ref 70–99)
Potassium: 4 mEq/L (ref 3.5–5.3)
Sodium: 139 mEq/L (ref 135–145)

## 2014-01-11 DIAGNOSIS — IMO0002 Reserved for concepts with insufficient information to code with codable children: Secondary | ICD-10-CM | POA: Diagnosis not present

## 2014-01-24 DIAGNOSIS — R109 Unspecified abdominal pain: Secondary | ICD-10-CM | POA: Diagnosis not present

## 2014-01-24 DIAGNOSIS — Z882 Allergy status to sulfonamides status: Secondary | ICD-10-CM | POA: Diagnosis not present

## 2014-01-24 DIAGNOSIS — M129 Arthropathy, unspecified: Secondary | ICD-10-CM | POA: Diagnosis not present

## 2014-01-24 DIAGNOSIS — N390 Urinary tract infection, site not specified: Secondary | ICD-10-CM | POA: Diagnosis not present

## 2014-01-31 ENCOUNTER — Ambulatory Visit (INDEPENDENT_AMBULATORY_CARE_PROVIDER_SITE_OTHER): Payer: Medicare Other | Admitting: Family Medicine

## 2014-01-31 ENCOUNTER — Encounter: Payer: Self-pay | Admitting: Family Medicine

## 2014-01-31 VITALS — BP 122/74 | HR 78 | Temp 97.9°F | Wt 198.2 lb

## 2014-01-31 DIAGNOSIS — N200 Calculus of kidney: Secondary | ICD-10-CM | POA: Diagnosis not present

## 2014-01-31 DIAGNOSIS — R8290 Unspecified abnormal findings in urine: Secondary | ICD-10-CM

## 2014-01-31 DIAGNOSIS — R82998 Other abnormal findings in urine: Secondary | ICD-10-CM | POA: Diagnosis not present

## 2014-01-31 DIAGNOSIS — Z87440 Personal history of urinary (tract) infections: Secondary | ICD-10-CM

## 2014-01-31 DIAGNOSIS — R609 Edema, unspecified: Secondary | ICD-10-CM

## 2014-01-31 DIAGNOSIS — E538 Deficiency of other specified B group vitamins: Secondary | ICD-10-CM | POA: Diagnosis not present

## 2014-01-31 MED ORDER — HYDROCODONE-ACETAMINOPHEN 5-325 MG PO TABS: 1.0000 | ORAL_TABLET | ORAL | Status: DC | PRN

## 2014-01-31 MED ORDER — CYANOCOBALAMIN 1000 MCG/ML IJ SOLN
1000.0000 ug | Freq: Once | INTRAMUSCULAR | Status: AC
  Administered 2014-01-31: 1000 ug via INTRAMUSCULAR

## 2014-01-31 NOTE — Patient Instructions (Signed)

## 2014-01-31 NOTE — Progress Notes (Signed)
Pre visit review using our clinic review tool, if applicable. No additional management support is needed unless otherwise documented below in the visit note. 

## 2014-02-01 ENCOUNTER — Ambulatory Visit: Payer: Self-pay

## 2014-02-01 LAB — POCT URINALYSIS DIPSTICK
Bilirubin, UA: NEGATIVE
Glucose, UA: NEGATIVE
KETONES UA: NEGATIVE
Nitrite, UA: NEGATIVE
PH UA: 5
Urobilinogen, UA: 0.2

## 2014-02-01 LAB — URINE CULTURE: Colony Count: 5000

## 2014-02-01 LAB — VITAMIN B12: Vitamin B-12: 392 pg/mL (ref 211–911)

## 2014-02-01 NOTE — Progress Notes (Signed)
Subjective:    Patient ID: [name], female    DOB: [birth year], 68 y.o.   MRN: [id number]  HPI Pt here to f/u edema of legs and recheck B12.  She was seen in UC recenltly with uti and would like her urine rechecked.    No new complaints.    Review of Systems As above  Past Medical History  Diagnosis Date  . Arthritis   . Diverticulosis   . GERD (gastroesophageal reflux disease)   . Osteoporosis   . Tuberous sclerosis     EXTERNAL LESIONS--  MAINLY HANDS (INTERNAL SCLEROTIC BONY LESIONS LUMBAR & PELVIS PER CT 11/2013)  . Seasonal allergies   . H/O hiatal hernia   . History of diverticulitis of colon   . Nodule of right lung     BENIGN--- AND STABLE PER  CT  11/2013  . History of gastric ulcer   . History of epilepsy     childhood age 56 to 60  ---secondary to high calcium deposts of optic nerve---  none since age 46 with pregency  . IBS (irritable bowel syndrome)   . History of kidney stones   . History of shingles     MARCH 2014--  BACK AREA  . History of vulvar dysplasia     S/P  LASER ABLATION 2014  . Right ureteral stone   . Ureteral stenosis, right   . DDD (degenerative disc disease), lumbosacral   . Renal cyst, left     STABLE PER CT 11/2013  . Mild obstructive sleep apnea     SLEEP STUDY  11-19-2012--  NO CPAP RX (DID NOT MEET CRITIRIA)   Current Outpatient Prescriptions  Medication Sig Dispense Refill  . alendronate (FOSAMAX) 70 MG tablet Take 1 tablet (70 mg total) by mouth once a week. Take with a full glass of water on an empty stomach.  30 tablet  11  . cyanocobalamin (,VITAMIN B-12,) 1000 MCG/ML injection Inject 1 mL (1,000 mcg total) into the muscle once.  1 mL  0  . furosemide (LASIX) 20 MG tablet Take 1 tablet (20 mg total) by mouth daily.  30 tablet  3  . HYDROcodone-acetaminophen (NORCO) 5-325 MG per tablet Take 1-2 tablets by mouth every 4 (four) hours as needed for moderate pain or severe pain.  60 tablet  0  . meloxicam (MOBIC) 15 MG tablet  Take 15 mg by mouth daily.      . naproxen (NAPROSYN) 500 MG tablet Take 1 tablet (500 mg total) by mouth 2 (two) times daily.  14 tablet  0  . omeprazole (PRILOSEC) 40 MG capsule Take 40 mg by mouth daily.      . ondansetron (ZOFRAN ODT) 4 MG disintegrating tablet Take 1 tablet (4 mg total) by mouth every 8 (eight) hours as needed for nausea or vomiting.  10 tablet  1  . oxybutynin (DITROPAN) 5 MG tablet Take 1 tablet (5 mg total) by mouth every 6 (six) hours as needed for bladder spasms.  40 tablet  4  . phenazopyridine (PYRIDIUM) 200 MG tablet Take 1 tablet (200 mg total) by mouth 3 (three) times daily as needed for pain.  30 tablet  6  . potassium chloride SA (K-DUR,KLOR-CON) 20 MEQ tablet Take 1 tablet (20 mEq total) by mouth daily.  30 tablet  3  . senna-docusate (SENOKOT S) 8.6-50 MG per tablet Take 1 tablet by mouth 2 (two) times daily.  60 tablet  0  . valACYclovir (VALTREX)  1000 MG tablet Take 1 tablet (1,000 mg total) by mouth 3 (three) times daily.  21 tablet  0  . Vitamin D, Ergocalciferol, (DRISDOL) 50000 UNITS CAPS capsule Take 1 capsule (50,000 Units total) by mouth every 7 (seven) days.  12 capsule  1   No current facility-administered medications for this visit.   History   Social History  . Marital Status: Widowed    Spouse Name: N/A    Number of Children: N/A  . Years of Education: N/A   Occupational History  . driver Other   Social History Main Topics  . Smoking status: Former Smoker -- 2.50 packs/day for 20 years    Types: Cigarettes    Quit date: 08/28/1992  . Smokeless tobacco: Never Used  . Alcohol Use: Yes     Comment: glass of wine occassional  . Drug Use: No  . Sexual Activity: Not Currently    Partners: Male   Other Topics Concern  . Not on file   Social History Narrative   Exercise-no       Objective:   Physical Exam BP 122/74  Pulse 78  Temp(Src) 97.9 F (36.6 C) (Oral)  Wt 198 lb 3.1 oz (89.9 kg)  SpO2 96% General appearance: alert,  cooperative, appears stated age and no distress Lungs: clear to auscultation bilaterally Heart: S1, S2 normal Extremities: extremities normal, atraumatic, no cyanosis or edema       Assessment & Plan:  1. Kidney stone History of--- not current - HYDROcodone-acetaminophen (NORCO) 5-325 MG per tablet; Take 1-2 tablets by mouth every 4 (four) hours as needed for moderate pain or severe pain.  Dispense: 60 tablet; Refill: 0  2. History of UTI  - POCT urinalysis dipstick  3. B12 deficiency Check labs today - B12 - cyanocobalamin ((VITAMIN B-12)) injection 1,000 mcg; Inject 1 mL (1,000 mcg total) into the muscle once.  4. Edema Elevated legs, con't diuretic Much improved  5. Abnormal urine   - Urine Culture

## 2014-02-04 DIAGNOSIS — N949 Unspecified condition associated with female genital organs and menstrual cycle: Secondary | ICD-10-CM | POA: Diagnosis not present

## 2014-02-04 DIAGNOSIS — N200 Calculus of kidney: Secondary | ICD-10-CM | POA: Diagnosis not present

## 2014-02-04 DIAGNOSIS — R82998 Other abnormal findings in urine: Secondary | ICD-10-CM | POA: Diagnosis not present

## 2014-02-24 DIAGNOSIS — M79609 Pain in unspecified limb: Secondary | ICD-10-CM | POA: Diagnosis not present

## 2014-02-24 DIAGNOSIS — M5137 Other intervertebral disc degeneration, lumbosacral region: Secondary | ICD-10-CM | POA: Diagnosis not present

## 2014-02-24 DIAGNOSIS — M25559 Pain in unspecified hip: Secondary | ICD-10-CM | POA: Diagnosis not present

## 2014-02-25 ENCOUNTER — Other Ambulatory Visit: Payer: Self-pay

## 2014-02-25 MED ORDER — POTASSIUM CHLORIDE CRYS ER 20 MEQ PO TBCR: 20.0000 meq | EXTENDED_RELEASE_TABLET | Freq: Every day | ORAL | Status: DC

## 2014-02-27 DIAGNOSIS — N200 Calculus of kidney: Secondary | ICD-10-CM | POA: Diagnosis not present

## 2014-02-28 ENCOUNTER — Other Ambulatory Visit: Payer: Self-pay

## 2014-02-28 DIAGNOSIS — R609 Edema, unspecified: Secondary | ICD-10-CM

## 2014-02-28 DIAGNOSIS — R279 Unspecified lack of coordination: Secondary | ICD-10-CM | POA: Diagnosis not present

## 2014-02-28 DIAGNOSIS — M62838 Other muscle spasm: Secondary | ICD-10-CM | POA: Diagnosis not present

## 2014-02-28 DIAGNOSIS — N3946 Mixed incontinence: Secondary | ICD-10-CM | POA: Diagnosis not present

## 2014-02-28 DIAGNOSIS — M6281 Muscle weakness (generalized): Secondary | ICD-10-CM | POA: Diagnosis not present

## 2014-02-28 MED ORDER — FUROSEMIDE 20 MG PO TABS: 20.0000 mg | ORAL_TABLET | Freq: Every day | ORAL | Status: DC

## 2014-03-07 DIAGNOSIS — M62838 Other muscle spasm: Secondary | ICD-10-CM | POA: Diagnosis not present

## 2014-03-07 DIAGNOSIS — N200 Calculus of kidney: Secondary | ICD-10-CM | POA: Diagnosis not present

## 2014-03-07 DIAGNOSIS — R278 Other lack of coordination: Secondary | ICD-10-CM | POA: Diagnosis not present

## 2014-03-07 DIAGNOSIS — M6281 Muscle weakness (generalized): Secondary | ICD-10-CM | POA: Diagnosis not present

## 2014-03-07 DIAGNOSIS — N3946 Mixed incontinence: Secondary | ICD-10-CM | POA: Diagnosis not present

## 2014-03-08 ENCOUNTER — Ambulatory Visit: Payer: Self-pay

## 2014-03-14 DIAGNOSIS — N3946 Mixed incontinence: Secondary | ICD-10-CM | POA: Diagnosis not present

## 2014-03-14 DIAGNOSIS — M62838 Other muscle spasm: Secondary | ICD-10-CM | POA: Diagnosis not present

## 2014-03-14 DIAGNOSIS — M6281 Muscle weakness (generalized): Secondary | ICD-10-CM | POA: Diagnosis not present

## 2014-03-14 DIAGNOSIS — R278 Other lack of coordination: Secondary | ICD-10-CM | POA: Diagnosis not present

## 2014-03-15 ENCOUNTER — Ambulatory Visit (INDEPENDENT_AMBULATORY_CARE_PROVIDER_SITE_OTHER): Payer: Medicare Other

## 2014-03-15 DIAGNOSIS — N200 Calculus of kidney: Secondary | ICD-10-CM | POA: Diagnosis not present

## 2014-03-15 DIAGNOSIS — E559 Vitamin D deficiency, unspecified: Secondary | ICD-10-CM | POA: Diagnosis not present

## 2014-03-15 DIAGNOSIS — R102 Pelvic and perineal pain: Secondary | ICD-10-CM | POA: Diagnosis not present

## 2014-03-15 MED ORDER — CYANOCOBALAMIN 1000 MCG/ML IJ SOLN: 1000.0000 ug | Freq: Once | INTRAMUSCULAR | Status: DC

## 2014-03-28 DIAGNOSIS — N3946 Mixed incontinence: Secondary | ICD-10-CM | POA: Diagnosis not present

## 2014-03-28 DIAGNOSIS — R278 Other lack of coordination: Secondary | ICD-10-CM | POA: Diagnosis not present

## 2014-03-28 DIAGNOSIS — M6281 Muscle weakness (generalized): Secondary | ICD-10-CM | POA: Diagnosis not present

## 2014-03-28 DIAGNOSIS — M62838 Other muscle spasm: Secondary | ICD-10-CM | POA: Diagnosis not present

## 2014-04-04 ENCOUNTER — Ambulatory Visit (INDEPENDENT_AMBULATORY_CARE_PROVIDER_SITE_OTHER): Payer: Medicare Other

## 2014-04-04 DIAGNOSIS — E538 Deficiency of other specified B group vitamins: Secondary | ICD-10-CM

## 2014-04-04 DIAGNOSIS — R609 Edema, unspecified: Secondary | ICD-10-CM

## 2014-04-04 MED ORDER — POTASSIUM CHLORIDE CRYS ER 20 MEQ PO TBCR: 20.0000 meq | EXTENDED_RELEASE_TABLET | Freq: Every day | ORAL | Status: DC

## 2014-04-04 MED ORDER — FUROSEMIDE 20 MG PO TABS: 20.0000 mg | ORAL_TABLET | Freq: Every day | ORAL | Status: DC

## 2014-04-04 MED ORDER — CYANOCOBALAMIN 1000 MCG/ML IJ SOLN
1000.0000 ug | Freq: Once | INTRAMUSCULAR | Status: AC
  Administered 2014-04-04: 1000 ug via INTRAMUSCULAR

## 2014-04-04 NOTE — Progress Notes (Signed)
Pt tolerated injection well

## 2014-04-04 NOTE — Progress Notes (Signed)
Pre visit review using our clinic review tool, if applicable. No additional management support is needed unless otherwise documented below in the visit note. 

## 2014-04-12 DIAGNOSIS — M6281 Muscle weakness (generalized): Secondary | ICD-10-CM | POA: Diagnosis not present

## 2014-04-12 DIAGNOSIS — R278 Other lack of coordination: Secondary | ICD-10-CM | POA: Diagnosis not present

## 2014-04-12 DIAGNOSIS — N3946 Mixed incontinence: Secondary | ICD-10-CM | POA: Diagnosis not present

## 2014-04-12 DIAGNOSIS — M62838 Other muscle spasm: Secondary | ICD-10-CM | POA: Diagnosis not present

## 2014-04-18 ENCOUNTER — Ambulatory Visit (INDEPENDENT_AMBULATORY_CARE_PROVIDER_SITE_OTHER): Payer: Medicare Other | Admitting: Medical

## 2014-04-18 ENCOUNTER — Encounter: Payer: Self-pay | Admitting: Medical

## 2014-04-18 VITALS — BP 140/90 | HR 76 | Temp 97.7°F | Ht 61.75 in | Wt 196.4 lb

## 2014-04-18 DIAGNOSIS — K120 Recurrent oral aphthae: Secondary | ICD-10-CM | POA: Diagnosis not present

## 2014-04-18 DIAGNOSIS — J209 Acute bronchitis, unspecified: Secondary | ICD-10-CM | POA: Diagnosis not present

## 2014-04-18 MED ORDER — FLUTICASONE PROPIONATE 50 MCG/ACT NA SUSP: 2.0000 | Freq: Every day | NASAL | Status: DC

## 2014-04-18 MED ORDER — MAGIC MOUTHWASH: ORAL | Status: DC

## 2014-04-18 MED ORDER — BENZONATATE 100 MG PO CAPS: 100.0000 mg | ORAL_CAPSULE | Freq: Three times a day (TID) | ORAL | Status: DC | PRN

## 2014-04-18 MED ORDER — AZITHROMYCIN 250 MG PO TABS: ORAL_TABLET | ORAL | Status: DC

## 2014-04-18 NOTE — Progress Notes (Signed)
Subjective:    Patient ID: [name], female    DOB: August 03, 1945, 68 y.o.   MRN: [id number]  HPI   Pt had productive cough, and chills early on about 3 wks. Ago. Chills stopped 1 wk ago but last 2 wks still coughing up some mucous. Last week some rt sinus pressure and cough still persists. Pt has some sores inside of her mouth x 2 days. She is not sure if cam up after eating hot food last week.  Past Medical History  Diagnosis Date  . Arthritis   . Diverticulosis   . GERD (gastroesophageal reflux disease)   . Osteoporosis   . Tuberous sclerosis     EXTERNAL LESIONS--  MAINLY HANDS (INTERNAL SCLEROTIC BONY LESIONS LUMBAR & PELVIS PER CT 11/2013)  . Seasonal allergies   . H/O hiatal hernia   . History of diverticulitis of colon   . Nodule of right lung     BENIGN--- AND STABLE PER  CT  11/2013  . History of gastric ulcer   . History of epilepsy     childhood age 54 to 38  ---secondary to high calcium deposts of optic nerve---  none since age 48 with pregency  . IBS (irritable bowel syndrome)   . History of kidney stones   . History of shingles     MARCH 2014--  BACK AREA  . History of vulvar dysplasia     S/P  LASER ABLATION 2014  . Right ureteral stone   . Ureteral stenosis, right   . DDD (degenerative disc disease), lumbosacral   . Renal cyst, left     STABLE PER CT 11/2013  . Mild obstructive sleep apnea     SLEEP STUDY  11-19-2012--  NO CPAP RX (DID NOT MEET CRITIRIA)    History   Social History  . Marital Status: Widowed    Spouse Name: N/A    Number of Children: N/A  . Years of Education: N/A   Occupational History  . driver Other   Social History Main Topics  . Smoking status: Former Smoker -- 2.50 packs/day for 20 years    Types: Cigarettes    Quit date: 08/28/1992  . Smokeless tobacco: Never Used  . Alcohol Use: Yes     Comment: glass of wine occassional  . Drug Use: No  . Sexual Activity:    Partners: Male   Other Topics Concern  . Not on  file   Social History Narrative   Exercise-no    Past Surgical History  Procedure Laterality Date  . Bladder surgery  1991    bladder reconstruction of torsion  . Colonoscopy    . Upper gastrointestinal endoscopy    . Cataract extraction w/ intraocular lens  implant, bilateral    . Cholecystectomy  11-18-2003  . Cardiac catheterization  06-26-2001    NORMAL CORONARY ARTERIES  . Orif right bimalleolar ankle fx  12-18-2003  . 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 Lumbar disc surgery  1985  . Vaginal hysterectomy  1975  . Hemorrhoid surgery  1970's  . Co2 laser application N/A 7/0/2637    Procedure: CO2 LASER APPLICATION;  Surgeon: Terrance Mass, MD;  Location: [facility];  Service: Gynecology;  Laterality: N/A;CO2 laser of vaginal and vulvar lesions  . Gynecologic cryosurgery    . Dilation and curettage of uterus  multiple -- last one 1975  . Augmentation mammaplasty  SALINE  . Cystoscopy with retrograde pyelogram, ureteroscopy and stent placement Right 11/22/2013    Procedure: CYSTOSCOPY WITH RETROGRADE PYELOGRAM, URETEROSCOPY AND STENT PLACEMENT;  Surgeon: Sharyn Creamer, MD;  Location: [facility];  Service: Urology;  Laterality: Right;  . Cystoscopy/retrograde/ureteroscopy Right 12/01/2013    Procedure: CYSTOSCOPY, RIGHT RETROGRADE/RIGHT DIGITAL URETEROSCOPY, RIGHT URETERAL STENT EXCHANGE;  Surgeon: Sharyn Creamer, MD;  Location: [facility];  Service: Urology;  Laterality: Right;  STAGED RIGHT URETEROSCOPY RIGHT URETER DILATION    . Holmium laser application Right 06/08/1094    Procedure: HOLMIUM LASER APPLICATION;  Surgeon: Sharyn Creamer, MD;  Location: [facility];  Service: Urology;  Laterality: Right;    Family History  Problem Relation Age of Onset  . Heart disease Mother   . Tuberous sclerosis Mother   . Tuberous sclerosis Sister   .  Tuberous sclerosis Daughter   . Tuberous sclerosis Son   . Tuberous sclerosis Maternal Aunt   . Tuberous sclerosis Maternal Grandmother   . Tuberous sclerosis Maternal Aunt   . Tuberous sclerosis Maternal Aunt   . Tuberous sclerosis Cousin     Allergies  Allergen Reactions  . Percocet [Oxycodone-Acetaminophen] Nausea And Vomiting  . Zoloft [Sertraline Hcl]     Pt can not take SSRI  . Contrast Media [Iodinated Diagnostic Agents] Rash    States was oral contrast- not betadine based IV contrast  . Sulfa Antibiotics Rash    Current Outpatient Prescriptions on File Prior to Visit  Medication Sig Dispense Refill  . alendronate (FOSAMAX) 70 MG tablet Take 1 tablet (70 mg total) by mouth once a week. Take with a full glass of water on an empty stomach. 30 tablet 11  . cyanocobalamin (,VITAMIN B-12,) 1000 MCG/ML injection Inject 1 mL (1,000 mcg total) into the muscle once. 1 mL 0  . cyanocobalamin (,VITAMIN B-12,) 1000 MCG/ML injection Inject 1 mL (1,000 mcg total) into the muscle once. 1 mL 0  . furosemide (LASIX) 20 MG tablet Take 1 tablet (20 mg total) by mouth daily. 90 tablet 0  . HYDROcodone-acetaminophen (NORCO) 5-325 MG per tablet Take 1-2 tablets by mouth every 4 (four) hours as needed for moderate pain or severe pain. 60 tablet 0  . meloxicam (MOBIC) 15 MG tablet Take 15 mg by mouth daily.    . naproxen (NAPROSYN) 500 MG tablet Take 1 tablet (500 mg total) by mouth 2 (two) times daily. 14 tablet 0  . omeprazole (PRILOSEC) 40 MG capsule Take 40 mg by mouth daily.    . ondansetron (ZOFRAN ODT) 4 MG disintegrating tablet Take 1 tablet (4 mg total) by mouth every 8 (eight) hours as needed for nausea or vomiting. 10 tablet 1  . oxybutynin (DITROPAN) 5 MG tablet Take 1 tablet (5 mg total) by mouth every 6 (six) hours as needed for bladder spasms. 40 tablet 4  . phenazopyridine (PYRIDIUM) 200 MG tablet Take 1 tablet (200 mg total) by mouth 3 (three) times daily as needed for pain. 30 tablet  6  . potassium chloride SA (K-DUR,KLOR-CON) 20 MEQ tablet Take 1 tablet (20 mEq total) by mouth daily. 90 tablet 0  . senna-docusate (SENOKOT S) 8.6-50 MG per tablet Take 1 tablet by mouth 2 (two) times daily. 60 tablet 0  . valACYclovir (VALTREX) 1000 MG tablet Take 1 tablet (1,000 mg total) by mouth 3 (three) times daily. 21 tablet 0  . Vitamin D, Ergocalciferol, (DRISDOL) 50000 UNITS CAPS capsule Take 1 capsule (50,000  Units total) by mouth every 7 (seven) days. 12 capsule 1   No current facility-administered medications on file prior to visit.    BP 140/90 mmHg  Pulse 76  Temp(Src) 97.7 F (36.5 C) (Oral)  Ht 5' 1.75" (1.568 m)  Wt 196 lb 6.4 oz (89.086 kg)  BMI 36.23 kg/m2  SpO2 98%     Review of Systems  Constitutional: Positive for chills.       Chills early on.  HENT: Positive for congestion and sinus pressure.   Respiratory: Positive for cough.        Productive cough  Cardiovascular: Negative for chest pain and palpitations.  Musculoskeletal: Negative for myalgias, back pain, joint swelling and neck stiffness.  Skin: Negative for rash.  Neurological: Negative for dizziness, tremors, seizures, syncope, facial asymmetry, speech difficulty, weakness, light-headedness, numbness and headaches.  Hematological: Negative for adenopathy. Does not bruise/bleed easily.       Objective:   Physical Exam  General  Mental Status - Alert. General Appearance - Well groomed. Not in acute distress.  Skin Rashes- No Rashes.  HEENT Head- Normal. Ear Auditory Canal - Left- Normal. Right - Normal.Tympanic Membrane- Left- Normal. Right- Normal. Eye Sclera/Conjunctiva- Left- Normal. Right- Normal. Nose & Sinuses Nasal Mucosa- Left- Not Boggy or Congested. Right- Not Boggy or Congested. Mouth & Throat Lips: Upper Lip- Normal: no dryness, cracking, pallor, cyanosis, or vesicular eruption. Lower Lip-Normal: no dryness, cracking, pallor, cyanosis or vesicular eruption. Buccal  Mucosa- Bilateral- No Aphthous ulcers. Oropharynx- No Discharge or Erythema. Upper palate rt side mild red wrinkled appearance inflamed appearance. Tonsils: Characteristics- Bilateral- No Erythema or Congestion. Size/Enlargement- Bilateral- No enlargement. Discharge- bilateral-None.  Neck Neck- Supple. No Masses.   Chest and Lung Exam Auscultation: Breath Sounds:-Normal.  Cardiovascular Auscultation:Rythm- Regular.  Murmurs & Other Heart Sounds:Ausculatation of the heart reveal- No Murmurs.  Lymphatic Head & Neck General Head & Neck Lymphatics: Bilateral: Description- No Localized lymphadenopathy.        Assessment & Plan:

## 2014-04-18 NOTE — Assessment & Plan Note (Signed)
Verses food burn. Will give magic mouthwash and recheck palate in 10-14 days. If not normal appearance consider referral to ENT.

## 2014-04-18 NOTE — Assessment & Plan Note (Signed)
Bronchtitis now after being sick for 3 wks. I am prescribing azithromycin for bronchitis and benzonatate for cough

## 2014-04-18 NOTE — Progress Notes (Signed)
Pre visit review using our clinic review tool, if applicable. No additional management support is needed unless otherwise documented below in the visit note. 

## 2014-04-18 NOTE — Patient Instructions (Signed)
You appear to have have bronchtitis now after being sick for 3 wks. I am prescribing azithromycin for bronchitis, benzonatate for cough, fluticasone for nasal congestion and magic mouth wash for possible apthous ulcers vs mucosal burn.  Follow up in 10-14 days or as needed.

## 2014-05-02 ENCOUNTER — Telehealth: Payer: Self-pay | Admitting: *Deleted

## 2014-05-02 ENCOUNTER — Ambulatory Visit (INDEPENDENT_AMBULATORY_CARE_PROVIDER_SITE_OTHER): Payer: Medicare Other | Admitting: Medical

## 2014-05-02 ENCOUNTER — Encounter: Payer: Self-pay | Admitting: Medical

## 2014-05-02 VITALS — BP 144/88 | HR 74 | Temp 98.4°F | Ht 62.5 in | Wt 199.4 lb

## 2014-05-02 DIAGNOSIS — E559 Vitamin D deficiency, unspecified: Secondary | ICD-10-CM

## 2014-05-02 DIAGNOSIS — Z8639 Personal history of other endocrine, nutritional and metabolic disease: Secondary | ICD-10-CM

## 2014-05-02 NOTE — Progress Notes (Signed)
Pre visit review using our clinic review tool, if applicable. No additional management support is needed unless otherwise documented below in the visit note. 

## 2014-05-02 NOTE — Telephone Encounter (Signed)
Per pt, her appointment for today (05/02/2014) was at 4:00pm.  It was for a 14 day follow up and her B12 injection.  She came out to the front desk at 4:45pm stating she did not want to be put on Edward's schedule in the future.  Pt stated she had to "beg" for her B12 shot and still had not been seen by Percell Miller.  She said she had to leave due to having another appointment she had at 5:00.    She had seen Percell Miller 04/18/2014 and said she had to wait an hour to see him then.  Pt stated she has never had that kind of service with Dr. Etter Sjogren.

## 2014-05-02 NOTE — Progress Notes (Signed)
   Subjective:    Patient ID: [name], female    DOB: Feb 08, 1946, 68 y.o.   MRN: [id number]  HPI  Pt left before I could evaluate her. She was seen only for nurse visit. I was ready to see pt by 4:40 pm. Her appointment was at 4 pm.   Review of Systems     Objective:   Physical Exam        Assessment & Plan:

## 2014-05-03 ENCOUNTER — Ambulatory Visit: Payer: Medicare Other

## 2014-05-03 DIAGNOSIS — M62838 Other muscle spasm: Secondary | ICD-10-CM | POA: Diagnosis not present

## 2014-05-03 DIAGNOSIS — M6281 Muscle weakness (generalized): Secondary | ICD-10-CM | POA: Diagnosis not present

## 2014-05-03 DIAGNOSIS — R278 Other lack of coordination: Secondary | ICD-10-CM | POA: Diagnosis not present

## 2014-05-03 DIAGNOSIS — N3946 Mixed incontinence: Secondary | ICD-10-CM | POA: Diagnosis not present

## 2014-05-09 ENCOUNTER — Ambulatory Visit: Payer: Self-pay | Admitting: Family Medicine

## 2014-05-17 DIAGNOSIS — R278 Other lack of coordination: Secondary | ICD-10-CM | POA: Diagnosis not present

## 2014-05-17 DIAGNOSIS — N3946 Mixed incontinence: Secondary | ICD-10-CM | POA: Diagnosis not present

## 2014-05-17 DIAGNOSIS — M62838 Other muscle spasm: Secondary | ICD-10-CM | POA: Diagnosis not present

## 2014-05-17 DIAGNOSIS — M6281 Muscle weakness (generalized): Secondary | ICD-10-CM | POA: Diagnosis not present

## 2014-05-23 ENCOUNTER — Telehealth: Payer: Self-pay | Admitting: Family Medicine

## 2014-05-23 MED ORDER — POTASSIUM CHLORIDE CRYS ER 20 MEQ PO TBCR: 20.0000 meq | EXTENDED_RELEASE_TABLET | Freq: Every day | ORAL | Status: DC

## 2014-05-23 NOTE — Telephone Encounter (Signed)
Caller name: Shelley, Cocke Relation to pt: self Call back number: [phone] [facility]acy[facility] [phone]  Reason for call:  Pt requesting a refill potassium chloride SA (K-DUR,KLOR-CON) 20 MEQ tablet & please state on rx what the medication is for due to pt living by herself and advising the attendant that may be adminstering what the rx is for.

## 2014-05-24 DIAGNOSIS — M545 Low back pain: Secondary | ICD-10-CM | POA: Diagnosis not present

## 2014-05-24 DIAGNOSIS — M79659 Pain in unspecified thigh: Secondary | ICD-10-CM | POA: Diagnosis not present

## 2014-05-31 ENCOUNTER — Ambulatory Visit: Payer: Medicare Other

## 2014-06-13 DIAGNOSIS — N3946 Mixed incontinence: Secondary | ICD-10-CM | POA: Diagnosis not present

## 2014-06-13 DIAGNOSIS — M62838 Other muscle spasm: Secondary | ICD-10-CM | POA: Diagnosis not present

## 2014-06-13 DIAGNOSIS — M6281 Muscle weakness (generalized): Secondary | ICD-10-CM | POA: Diagnosis not present

## 2014-06-13 DIAGNOSIS — R278 Other lack of coordination: Secondary | ICD-10-CM | POA: Diagnosis not present

## 2014-06-28 DIAGNOSIS — D2311 Other benign neoplasm of skin of right eyelid, including canthus: Secondary | ICD-10-CM | POA: Diagnosis not present

## 2014-06-28 DIAGNOSIS — Z961 Presence of intraocular lens: Secondary | ICD-10-CM | POA: Diagnosis not present

## 2014-07-08 DIAGNOSIS — M5137 Other intervertebral disc degeneration, lumbosacral region: Secondary | ICD-10-CM | POA: Diagnosis not present

## 2014-07-11 ENCOUNTER — Other Ambulatory Visit: Payer: Self-pay | Admitting: Family Medicine

## 2014-07-11 NOTE — Telephone Encounter (Signed)
Last seen 01/31/14 and filled 04/04/14 #90 Labs 01/07/14  Please advise if refill appropriate.     KP

## 2014-07-11 NOTE — Telephone Encounter (Signed)
Caller name:[name] Relationship to patient: Self Can be reached:[phone] Pharmacy:[facility] [phone]  Reason for call: PT states that she is on her last pill for furosemide (LASIX) 20 MG tablet - would [facility].

## 2014-07-12 DIAGNOSIS — M6281 Muscle weakness (generalized): Secondary | ICD-10-CM | POA: Diagnosis not present

## 2014-07-12 DIAGNOSIS — M62838 Other muscle spasm: Secondary | ICD-10-CM | POA: Diagnosis not present

## 2014-07-12 DIAGNOSIS — R278 Other lack of coordination: Secondary | ICD-10-CM | POA: Diagnosis not present

## 2014-07-12 DIAGNOSIS — N3946 Mixed incontinence: Secondary | ICD-10-CM | POA: Diagnosis not present

## 2014-07-14 DIAGNOSIS — Q851 Tuberous sclerosis: Secondary | ICD-10-CM | POA: Diagnosis not present

## 2014-07-14 DIAGNOSIS — B009 Herpesviral infection, unspecified: Secondary | ICD-10-CM | POA: Diagnosis not present

## 2014-07-14 DIAGNOSIS — Z0000 Encounter for general adult medical examination without abnormal findings: Secondary | ICD-10-CM | POA: Diagnosis not present

## 2014-07-14 DIAGNOSIS — E538 Deficiency of other specified B group vitamins: Secondary | ICD-10-CM | POA: Diagnosis not present

## 2014-07-14 DIAGNOSIS — I10 Essential (primary) hypertension: Secondary | ICD-10-CM | POA: Diagnosis not present

## 2014-07-14 DIAGNOSIS — M1612 Unilateral primary osteoarthritis, left hip: Secondary | ICD-10-CM | POA: Diagnosis not present

## 2014-07-14 DIAGNOSIS — Z13220 Encounter for screening for lipoid disorders: Secondary | ICD-10-CM | POA: Diagnosis not present

## 2014-07-14 DIAGNOSIS — R609 Edema, unspecified: Secondary | ICD-10-CM | POA: Diagnosis not present

## 2014-07-14 DIAGNOSIS — Z23 Encounter for immunization: Secondary | ICD-10-CM | POA: Diagnosis not present

## 2014-07-15 DIAGNOSIS — M5137 Other intervertebral disc degeneration, lumbosacral region: Secondary | ICD-10-CM | POA: Diagnosis not present

## 2014-07-22 DIAGNOSIS — M5137 Other intervertebral disc degeneration, lumbosacral region: Secondary | ICD-10-CM | POA: Diagnosis not present

## 2014-07-29 DIAGNOSIS — M5137 Other intervertebral disc degeneration, lumbosacral region: Secondary | ICD-10-CM | POA: Diagnosis not present

## 2014-08-01 ENCOUNTER — Other Ambulatory Visit: Payer: Self-pay

## 2014-08-01 DIAGNOSIS — Z1231 Encounter for screening mammogram for malignant neoplasm of breast: Secondary | ICD-10-CM

## 2014-08-01 DIAGNOSIS — M810 Age-related osteoporosis without current pathological fracture: Secondary | ICD-10-CM | POA: Diagnosis not present

## 2014-08-01 DIAGNOSIS — Z13820 Encounter for screening for osteoporosis: Secondary | ICD-10-CM | POA: Diagnosis not present

## 2014-08-08 DIAGNOSIS — M810 Age-related osteoporosis without current pathological fracture: Secondary | ICD-10-CM | POA: Diagnosis not present

## 2014-08-16 ENCOUNTER — Telehealth: Payer: Self-pay | Admitting: *Deleted

## 2014-08-16 NOTE — Telephone Encounter (Signed)
Pt called c/o vaginal bleeding had history of transvaginal hysterectomy in 1975. Pt transferred to front desk for OV

## 2014-08-17 ENCOUNTER — Ambulatory Visit (INDEPENDENT_AMBULATORY_CARE_PROVIDER_SITE_OTHER): Payer: Medicare Other | Admitting: Gynecology

## 2014-08-17 ENCOUNTER — Other Ambulatory Visit (HOSPITAL_COMMUNITY)
Admission: RE | Admit: 2014-08-17 | Discharge: 2014-08-17 | Disposition: A | Discharge status: Home / Self Care | Payer: Medicare Other | Source: Ambulatory Visit | Attending: Gynecology | Admitting: Gynecology

## 2014-08-17 ENCOUNTER — Encounter: Payer: Self-pay | Admitting: Gynecology

## 2014-08-17 VITALS — BP 124/80 | Ht 62.0 in | Wt 201.0 lb

## 2014-08-17 DIAGNOSIS — Z124 Encounter for screening for malignant neoplasm of cervix: Secondary | ICD-10-CM | POA: Diagnosis not present

## 2014-08-17 DIAGNOSIS — R102 Pelvic and perineal pain unspecified side: Secondary | ICD-10-CM

## 2014-08-17 DIAGNOSIS — N952 Postmenopausal atrophic vaginitis: Secondary | ICD-10-CM | POA: Diagnosis not present

## 2014-08-17 DIAGNOSIS — B3731 Acute candidiasis of vulva and vagina: Secondary | ICD-10-CM

## 2014-08-17 DIAGNOSIS — N950 Postmenopausal bleeding: Secondary | ICD-10-CM | POA: Diagnosis not present

## 2014-08-17 DIAGNOSIS — Z1151 Encounter for screening for human papillomavirus (HPV): Secondary | ICD-10-CM | POA: Insufficient documentation

## 2014-08-17 DIAGNOSIS — B373 Candidiasis of vulva and vagina: Secondary | ICD-10-CM | POA: Diagnosis not present

## 2014-08-17 DIAGNOSIS — M810 Age-related osteoporosis without current pathological fracture: Secondary | ICD-10-CM

## 2014-08-17 LAB — WET PREP FOR TRICH, YEAST, CLUE
Clue Cells Wet Prep HPF POC: NONE SEEN
TRICH WET PREP: NONE SEEN

## 2014-08-17 MED ORDER — NONFORMULARY OR COMPOUNDED ITEM: Status: DC

## 2014-08-17 MED ORDER — FLUCONAZOLE 100 MG PO TABS: ORAL_TABLET | ORAL | Status: DC

## 2014-08-17 NOTE — Progress Notes (Signed)
Patient is a 69 year old who presented to the office today stating she was having a vaginal odor with discharge slightly greenish and brows that time. She is not sexually active. She is not on any hormonal replacement therapy. She also brought with her today the report from her bone density study done at her primary care physician's office and wanted discuss some of the treatment options that have been offered her. Her past history is as follows:   Patient with history of transvaginal hysterectomy in 1975 for benign entity. 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 Her mother was also diagnosed and died with tuberous sclerosis and is being followed at [facility] in [facility].  2013 benign colon polyps  Past history vitamin D deficiency  Shingles outbreak perirectal and buttocks area in 2014  Pap smear February 2014 demonstrated LOW GRADE SQUAMOUS INTRAEPITHELIAL LESION: VAIN-1/ HPV (LSIL). Colposcopic directed biopsy demonstrated: Diagnosis 1. Vagina, biopsy, cuff - INFLAMMATION AND SLIGHT SQUAMOUS ATYPIA. - SEE MICROSCOPIC DESCRIPTION 2. Labium, biopsy, left, crease between majora/minora - MILD SQUAMOUS HYPERPLASIA AND HYPERKERATOSIS. - SEE MICROSCOPIC DESCRIPTION 3. Labium, biopsy, right majora medial - MILD SQUAMOUS HYPERPLASIA AND HYPERKERATOSIS. - SEE MICROSCOPIC DESCRIPTION 4. Labium, biopsy, left majora lateral - MILD SQUAMOUS HYPERPLASIA AND HYPERKERATOSIS. - SEE MICROSCOPIC DESCRIPTION 5. Skin , right groin  Microscopic Comment 1. There is squamous mucosa with inflammation and associated squamous atypia including rare koilocytes. Much of the squamous atypia has features consistent with inflammatory and reactive atypia.  Multiple additional levels are examined and the findings are similar. A p16 (HPV) stain is performed and there are rare cells positive in the superficial epithelium which may represent focal human papillomavirus cytopathic effect. No fungi are identified with PAS stain. 2. -5. There is slight, variable squamous hyperplasia associated with hyperkeratosis and there occasional there are foci with koilocytic change. 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 There are no dysplastic changes and there is no evidence of malignancy. - SQUAMOUS HYPERPLASIA AND HYPERKERATOSIS. - SEE MICROSCOPIC DESCRIPTION  As a result of this patient underwent CO2 laser ablation of perirectal, left labia majora and minora and vaginal cuff and right groin hyperplastic lesions and condyloma acuminatum.  Patient today was also complaining some popping sensation in lower abdomen. She did not appear to be any acute distress.  Exam: Abdomen: Soft nontender no rebound or guarding Pelvic: Bartholin urethra Skene glands with no abnormalities detected Vagina: Atrophic changes vaginal cuff very friable Wet prep was obtained few yeast were noted Pap smear was also obtained Bimanual exam no palpable mass or tenderness Rectal exam: Not done  Assessment/plan: Patient several years ago with history of vaginal hysterectomy now with brownish discharge probably from severe vaginal atrophy at the vaginal cuff. Patient status post CO2 laser ablation of perirectal, left labia majora, left labia minora, and vaginal cuff and right groin hyperplastic lesions and condyloma acuminatum in 2014. A Pap smear was repeated today. Patient will be prescribed for her yeast vaginitis Diflucan 100 mg today and repeat in 72 hours. She will return back next week for an ultrasound to better assess her adnexa/ovary. I have recommended that she start vaginal estrogen  twice a week for severe vaginal atrophy. We'll wait for the  results of the Pap smear and manage accordingly.  I reviewed her bone density study done at [facility] on [facility] of this year. Patient's lowest T score was at the right femoral neck with a value of -2.6. They had done a Frax analysis which demonstrated also that her probability of fracture is almost 22% and her risk of a hip fracture 4% both exceeding threshold. She stated that discussed with her different treatment options and I have recommended that for her she will be better off on Prolia 60 mg subcutaneous every 6 months or Reclast IV once a year or by mouth Actonel or Fosamax these woodwork not only on her spine but especially in her hip where she has evidence already of osteoporosis. The risks benefits and pros and cons of the medication were discussed as well.  The size examining the patient more than 50% of time was spent on counseling and coordinating care with this patient.

## 2014-08-17 NOTE — Addendum Note (Signed)
Addended by: Burnett Kanaris on: 08/17/2014 11:42 AM   Modules accepted: Orders

## 2014-08-17 NOTE — Addendum Note (Signed)
Addended by: Burnett Kanaris on: 08/17/2014 02:53 PM   Modules accepted: Orders

## 2014-08-17 NOTE — Patient Instructions (Signed)

## 2014-08-18 LAB — CYTOLOGY - PAP

## 2014-08-19 DIAGNOSIS — M11262 Other chondrocalcinosis, left knee: Secondary | ICD-10-CM | POA: Diagnosis not present

## 2014-08-19 DIAGNOSIS — M160 Bilateral primary osteoarthritis of hip: Secondary | ICD-10-CM | POA: Diagnosis not present

## 2014-08-19 DIAGNOSIS — M25551 Pain in right hip: Secondary | ICD-10-CM | POA: Diagnosis not present

## 2014-08-19 DIAGNOSIS — M25562 Pain in left knee: Secondary | ICD-10-CM | POA: Diagnosis not present

## 2014-09-05 ENCOUNTER — Other Ambulatory Visit (HOSPITAL_COMMUNITY): Payer: Self-pay | Admitting: Orthopaedic Surgery

## 2014-09-05 ENCOUNTER — Ambulatory Visit (INDEPENDENT_AMBULATORY_CARE_PROVIDER_SITE_OTHER): Payer: Medicare Other | Admitting: Gynecology

## 2014-09-05 ENCOUNTER — Ambulatory Visit (INDEPENDENT_AMBULATORY_CARE_PROVIDER_SITE_OTHER): Payer: Medicare Other

## 2014-09-05 ENCOUNTER — Encounter: Payer: Self-pay | Admitting: Gynecology

## 2014-09-05 VITALS — BP 122/76

## 2014-09-05 DIAGNOSIS — R102 Pelvic and perineal pain: Secondary | ICD-10-CM

## 2014-09-05 DIAGNOSIS — N952 Postmenopausal atrophic vaginitis: Secondary | ICD-10-CM | POA: Diagnosis not present

## 2014-09-05 DIAGNOSIS — IMO0002 Reserved for concepts with insufficient information to code with codable children: Secondary | ICD-10-CM

## 2014-09-05 DIAGNOSIS — R896 Abnormal cytological findings in specimens from other organs, systems and tissues: Secondary | ICD-10-CM | POA: Diagnosis not present

## 2014-09-05 DIAGNOSIS — M25562 Pain in left knee: Secondary | ICD-10-CM | POA: Diagnosis not present

## 2014-09-05 DIAGNOSIS — M160 Bilateral primary osteoarthritis of hip: Secondary | ICD-10-CM | POA: Diagnosis not present

## 2014-09-05 NOTE — Progress Notes (Signed)
   Patient presented to the office today to discuss her ultrasound. When she was seen in the office on March 16 should complaining of some popping and low abdominal pressure. Patient with prior history of hysterectomy. Patient also came to discuss her recent Pap smear. Her history as follows:  She also brought with her today the report from her bone density study done at her primary care physician's office and wanted discuss some of the treatment options that have been offered her. Her past history is as follows:   Patient with history of transvaginal hysterectomy in 1975 for benign entity. 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 Her mother was also diagnosed and died with tuberous sclerosis and is being followed at [facility] in [facility].  2013 benign colon polyps  Past history vitamin D deficiency  Shingles outbreak perirectal and buttocks area in 2014  Pap smear February 2014 demonstrated LOW GRADE SQUAMOUS INTRAEPITHELIAL LESION: VAIN-1/ HPV (LSIL). Colposcopic directed biopsy demonstrated: Diagnosis 1. Vagina, biopsy, cuff - INFLAMMATION AND SLIGHT SQUAMOUS ATYPIA. - SEE MICROSCOPIC DESCRIPTION 2. Labium, biopsy, left, crease between majora/minora - MILD SQUAMOUS HYPERPLASIA AND HYPERKERATOSIS. - SEE MICROSCOPIC DESCRIPTION 3. Labium, biopsy, right majora medial - MILD SQUAMOUS HYPERPLASIA AND HYPERKERATOSIS. - SEE MICROSCOPIC DESCRIPTION 4. Labium, biopsy, left majora lateral - MILD SQUAMOUS HYPERPLASIA AND HYPERKERATOSIS. - SEE MICROSCOPIC DESCRIPTION 5. Skin , right groin  Microscopic Comment 1. There is squamous mucosa with inflammation and associated squamous atypia including rare koilocytes. Much of the squamous  atypia has features consistent with inflammatory and reactive atypia. Multiple additional levels are examined and the findings are similar. A p16 (HPV) stain is performed and there are rare cells positive in the superficial epithelium which may represent focal human papillomavirus cytopathic effect. No fungi are identified with PAS stain. 2. -5. There is slight, variable squamous hyperplasia associated with hyperkeratosis and there occasional there are foci with koilocytic change. 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 There are no dysplastic changes and there is no evidence of malignancy. - SQUAMOUS HYPERPLASIA AND HYPERKERATOSIS. - SEE MICROSCOPIC DESCRIPTION  As a result of this patient underwent CO2 laser ablation of perirectal, left labia majora and minora and vaginal cuff and right groin hyperplastic lesions and condyloma acuminatum  .Pap smear demonstrated atypical squamous cells of undetermined significance negative for HPV on last office visit or 16 2016.  Ultrasound today: Absent uterus: Vaginal cuff normal. Right and left ovary not visualized. No apparent adnexal masses.  Assessment/plan: Patient with recent atypical squamous cells of undetermined significance negative HPV we'll follow ASCCP guidelines and follow-up with Pap smear and HPV screening 1 year. Patient was reminded to start her Premarin vaginal cream twice a week to help with her vaginal atrophy as well. She will continue to follow-up with her primary care physician as well. She scheduled to see his back in one year for annual exam or when necessary.

## 2014-09-05 NOTE — Progress Notes (Signed)
Called for orders surgery 09-16-14 pre op 09-12-14 Thanks 

## 2014-09-08 ENCOUNTER — Other Ambulatory Visit (HOSPITAL_COMMUNITY): Payer: Self-pay | Admitting: *Deleted

## 2014-09-08 NOTE — Patient Instructions (Addendum)
[name]  09/08/2014   Your procedure is scheduled on: Friday April 15th, 2016  Report to [facility] Main  Entrance and follow signs to               [place] at 630 AM.  Call this number if you have problems the morning of surgery [phone]   Remember:  Do not eat food or drink liquids :After Midnight.     Take these medicines the morning of surgery with A SIP OF WATER: Omeprazole (Prilosec)                               You may not have any metal on your body including hair pins and              piercings  Do not wear jewelry, make-up, lotions, powders or perfumes.             Do not wear nail polish.  Do not shave  48 hours prior to surgery.              Do not bring valuables to the hospital. [place].  Contacts, dentures or bridgework may not be worn into surgery.  Leave suitcase in the car. After surgery it may be brought to your room.      Special Instructions: MRSA information sheet               _____________________________________________________________________             [facility] - Preparing for Surgery Before surgery, you can play an important role.  Because skin is not sterile, your skin needs to be as free of germs as possible.  You can reduce the number of germs on your skin by washing with CHG (chlorahexidine gluconate) soap before surgery.  CHG is an antiseptic cleaner which kills germs and bonds with the skin to continue killing germs even after washing. Please DO NOT use if you have an allergy to CHG or antibacterial soaps.  If your skin becomes reddened/irritated stop using the CHG and inform your nurse when you arrive at Short Stay. Do not shave (including legs and underarms) for at least 48 hours prior to the first CHG shower.  You may shave your face/neck. Please follow these instructions carefully:  1.  Shower with CHG Soap the night before surgery and the   morning of Surgery.  2.  If you choose to wash your hair, wash your hair first as usual with your  normal  shampoo.  3.  After you shampoo, rinse your hair and body thoroughly to remove the  shampoo.                           4.  Use CHG as you would any other liquid soap.  You can apply chg directly  to the skin and wash                       Gently with a scrungie or clean washcloth.  5.  Apply the CHG Soap to your body ONLY FROM THE NECK DOWN.   Do not use on face/ open  Wound or open sores. Avoid contact with eyes, ears mouth and genitals (private parts).                       Wash face,  Genitals (private parts) with your normal soap.             6.  Wash thoroughly, paying special attention to the area where your surgery  will be performed.  7.  Thoroughly rinse your body with warm water from the neck down.  8.  DO NOT shower/wash with your normal soap after using and rinsing off  the CHG Soap.                9.  Pat yourself dry with a clean towel.            10.  Wear clean pajamas.            11.  Place clean sheets on your bed the night of your first shower and do not  sleep with pets. Day of Surgery : Do not apply any lotions/deodorants the morning of surgery.  Please wear clean clothes to the hospital/surgery center.  FAILURE TO FOLLOW THESE INSTRUCTIONS MAY RESULT IN THE CANCELLATION OF YOUR SURGERY PATIENT SIGNATURE_________________________________  NURSE SIGNATURE__________________________________  ________________________________________________________________________

## 2014-09-12 ENCOUNTER — Encounter (HOSPITAL_COMMUNITY)
Admission: RE | Admit: 2014-09-12 | Discharge: 2014-09-12 | Disposition: A | Discharge status: Home / Self Care | Payer: Medicare Other | Source: Ambulatory Visit | Attending: Orthopaedic Surgery | Admitting: Orthopaedic Surgery

## 2014-09-12 ENCOUNTER — Encounter (HOSPITAL_COMMUNITY): Payer: Self-pay

## 2014-09-12 DIAGNOSIS — M1612 Unilateral primary osteoarthritis, left hip: Secondary | ICD-10-CM | POA: Diagnosis not present

## 2014-09-12 DIAGNOSIS — Z01818 Encounter for other preprocedural examination: Secondary | ICD-10-CM | POA: Insufficient documentation

## 2014-09-12 HISTORY — DX: Other specified postprocedural states: R11.2

## 2014-09-12 HISTORY — DX: Other specified postprocedural states: Z98.890

## 2014-09-12 LAB — BASIC METABOLIC PANEL
ANION GAP: 11 (ref 5–15)
BUN: 18 mg/dL (ref 6–23)
CALCIUM: 9.3 mg/dL (ref 8.4–10.5)
CHLORIDE: 105 mmol/L (ref 96–112)
CO2: 27 mmol/L (ref 19–32)
Creatinine, Ser: 0.7 mg/dL (ref 0.50–1.10)
GFR calc Af Amer: >90 mL/min (ref >90)
GFR calc non Af Amer: 88 mL/min — ABNORMAL LOW (ref >90)
Glucose, Bld: 98 mg/dL (ref 70–99)
Potassium: 4.1 mmol/L (ref 3.5–5.1)
Sodium: 143 mmol/L (ref 135–145)

## 2014-09-12 LAB — ABO/RH: ABO/RH(D): O NEG

## 2014-09-12 LAB — CBC
HEMATOCRIT: 39.4 % (ref 36.0–46.0)
Hemoglobin: 13 g/dL (ref 12.0–15.0)
MCH: 30.2 pg (ref 26.0–34.0)
MCHC: 33 g/dL (ref 30.0–36.0)
MCV: 91.6 fL (ref 78.0–100.0)
Platelets: 207 10*3/uL (ref 150–400)
RBC: 4.3 MIL/uL (ref 3.87–5.11)
RDW: 13.5 % (ref 11.5–15.5)
WBC: 5.4 10*3/uL (ref 4.0–10.5)

## 2014-09-12 LAB — APTT: aPTT: 26 seconds (ref 24–37)

## 2014-09-12 LAB — PROTIME-INR
INR: 1.08 (ref 0.00–1.49)
PROTHROMBIN TIME: 14.1 s (ref 11.6–15.2)

## 2014-09-12 LAB — SURGICAL PCR SCREEN
MRSA, PCR: NEGATIVE
Staphylococcus aureus: NEGATIVE

## 2014-09-12 NOTE — Progress Notes (Addendum)
EKG epic 12/21/2013 ECHO epic 09/07/2007 Sleep study 12/01/2012

## 2014-09-16 ENCOUNTER — Inpatient Hospital Stay (HOSPITAL_COMMUNITY): Payer: Medicare Other

## 2014-09-16 ENCOUNTER — Inpatient Hospital Stay (HOSPITAL_COMMUNITY): Payer: Medicare Other | Admitting: Anesthesiology

## 2014-09-16 ENCOUNTER — Inpatient Hospital Stay (HOSPITAL_COMMUNITY)
Admission: RE | Admit: 2014-09-16 | Discharge: 2014-09-19 | DRG: 470 | Disposition: A | Discharge status: Skilled Nursing Facility | Payer: Medicare Other | Source: Ambulatory Visit | Attending: Orthopaedic Surgery | Admitting: Orthopaedic Surgery

## 2014-09-16 ENCOUNTER — Encounter (HOSPITAL_COMMUNITY): Payer: Self-pay | Admitting: *Deleted

## 2014-09-16 ENCOUNTER — Encounter (HOSPITAL_COMMUNITY)
Admission: RE | Disposition: A | Discharge status: Skilled Nursing Facility | Payer: Self-pay | Source: Ambulatory Visit | Attending: Orthopaedic Surgery

## 2014-09-16 DIAGNOSIS — G4733 Obstructive sleep apnea (adult) (pediatric): Secondary | ICD-10-CM | POA: Diagnosis present

## 2014-09-16 DIAGNOSIS — R911 Solitary pulmonary nodule: Secondary | ICD-10-CM | POA: Diagnosis not present

## 2014-09-16 DIAGNOSIS — Z96641 Presence of right artificial hip joint: Secondary | ICD-10-CM | POA: Diagnosis not present

## 2014-09-16 DIAGNOSIS — M25552 Pain in left hip: Secondary | ICD-10-CM | POA: Diagnosis not present

## 2014-09-16 DIAGNOSIS — Z96642 Presence of left artificial hip joint: Secondary | ICD-10-CM

## 2014-09-16 DIAGNOSIS — E669 Obesity, unspecified: Secondary | ICD-10-CM | POA: Diagnosis present

## 2014-09-16 DIAGNOSIS — M5137 Other intervertebral disc degeneration, lumbosacral region: Secondary | ICD-10-CM | POA: Diagnosis not present

## 2014-09-16 DIAGNOSIS — M169 Osteoarthritis of hip, unspecified: Secondary | ICD-10-CM | POA: Diagnosis not present

## 2014-09-16 DIAGNOSIS — M810 Age-related osteoporosis without current pathological fracture: Secondary | ICD-10-CM | POA: Diagnosis present

## 2014-09-16 DIAGNOSIS — K219 Gastro-esophageal reflux disease without esophagitis: Secondary | ICD-10-CM | POA: Diagnosis present

## 2014-09-16 DIAGNOSIS — J302 Other seasonal allergic rhinitis: Secondary | ICD-10-CM | POA: Diagnosis not present

## 2014-09-16 DIAGNOSIS — Z87442 Personal history of urinary calculi: Secondary | ICD-10-CM | POA: Diagnosis not present

## 2014-09-16 DIAGNOSIS — M1612 Unilateral primary osteoarthritis, left hip: Principal | ICD-10-CM | POA: Diagnosis present

## 2014-09-16 DIAGNOSIS — K589 Irritable bowel syndrome without diarrhea: Secondary | ICD-10-CM | POA: Diagnosis present

## 2014-09-16 DIAGNOSIS — Z471 Aftercare following joint replacement surgery: Secondary | ICD-10-CM | POA: Diagnosis not present

## 2014-09-16 DIAGNOSIS — Z87891 Personal history of nicotine dependence: Secondary | ICD-10-CM | POA: Diagnosis not present

## 2014-09-16 DIAGNOSIS — Z419 Encounter for procedure for purposes other than remedying health state, unspecified: Secondary | ICD-10-CM

## 2014-09-16 DIAGNOSIS — Q851 Tuberous sclerosis: Secondary | ICD-10-CM | POA: Diagnosis not present

## 2014-09-16 DIAGNOSIS — K5790 Diverticulosis of intestine, part unspecified, without perforation or abscess without bleeding: Secondary | ICD-10-CM | POA: Diagnosis not present

## 2014-09-16 DIAGNOSIS — R0602 Shortness of breath: Secondary | ICD-10-CM | POA: Diagnosis not present

## 2014-09-16 DIAGNOSIS — D62 Acute posthemorrhagic anemia: Secondary | ICD-10-CM | POA: Diagnosis not present

## 2014-09-16 DIAGNOSIS — M1990 Unspecified osteoarthritis, unspecified site: Secondary | ICD-10-CM | POA: Diagnosis not present

## 2014-09-16 HISTORY — PX: TOTAL HIP ARTHROPLASTY: SHX124

## 2014-09-16 LAB — TYPE AND SCREEN
ABO/RH(D): O NEG
Antibody Screen: NEGATIVE

## 2014-09-16 SURGERY — ARTHROPLASTY, HIP, TOTAL, ANTERIOR APPROACH
Anesthesia: General | Site: Hip | Laterality: Left

## 2014-09-16 MED ORDER — ONDANSETRON HCL 4 MG/2ML IJ SOLN: 4.0000 mg | Freq: Four times a day (QID) | INTRAMUSCULAR | Status: DC | PRN

## 2014-09-16 MED ORDER — ONDANSETRON HCL 4 MG PO TABS: 4.0000 mg | ORAL_TABLET | Freq: Four times a day (QID) | ORAL | Status: DC | PRN

## 2014-09-16 MED ORDER — NEOSTIGMINE METHYLSULFATE 10 MG/10ML IV SOLN
INTRAVENOUS | Status: DC | PRN
  Administered 2014-09-16: 3 mg via INTRAVENOUS

## 2014-09-16 MED ORDER — HYDROMORPHONE HCL 1 MG/ML IJ SOLN
INTRAMUSCULAR | Status: AC
  Filled 2014-09-16: qty 1

## 2014-09-16 MED ORDER — METOCLOPRAMIDE HCL 5 MG/ML IJ SOLN
INTRAMUSCULAR | Status: DC | PRN
  Administered 2014-09-16: 10 mg via INTRAVENOUS

## 2014-09-16 MED ORDER — DEXAMETHASONE SODIUM PHOSPHATE 10 MG/ML IJ SOLN
INTRAMUSCULAR | Status: DC | PRN
  Administered 2014-09-16: 10 mg via INTRAVENOUS

## 2014-09-16 MED ORDER — ONDANSETRON HCL 4 MG/2ML IJ SOLN
INTRAMUSCULAR | Status: DC | PRN
  Administered 2014-09-16: 4 mg via INTRAVENOUS

## 2014-09-16 MED ORDER — ACETAMINOPHEN 325 MG PO TABS: 650.0000 mg | ORAL_TABLET | Freq: Four times a day (QID) | ORAL | Status: DC | PRN

## 2014-09-16 MED ORDER — HYDROMORPHONE HCL 2 MG/ML IJ SOLN
INTRAMUSCULAR | Status: AC
  Filled 2014-09-16: qty 1

## 2014-09-16 MED ORDER — CEFAZOLIN SODIUM-DEXTROSE 2-3 GM-% IV SOLR
2.0000 g | INTRAVENOUS | Status: AC
  Administered 2014-09-16: 2 g via INTRAVENOUS

## 2014-09-16 MED ORDER — ONDANSETRON HCL 4 MG/2ML IJ SOLN
INTRAMUSCULAR | Status: AC
  Filled 2014-09-16: qty 2

## 2014-09-16 MED ORDER — DOCUSATE SODIUM 100 MG PO CAPS
100.0000 mg | ORAL_CAPSULE | Freq: Two times a day (BID) | ORAL | Status: DC
  Administered 2014-09-16 – 2014-09-19 (×6): 100 mg via ORAL

## 2014-09-16 MED ORDER — METHOCARBAMOL 500 MG PO TABS
500.0000 mg | ORAL_TABLET | Freq: Four times a day (QID) | ORAL | Status: DC | PRN
Start: 2014-09-16 — End: 2014-09-19
  Administered 2014-09-16 – 2014-09-18 (×7): 500 mg via ORAL
  Filled 2014-09-16 (×7): qty 1

## 2014-09-16 MED ORDER — VITAMIN D3 25 MCG (1000 UNIT) PO TABS
4000.0000 [IU] | ORAL_TABLET | Freq: Every day | ORAL | Status: DC
  Administered 2014-09-16 – 2014-09-17 (×2): 4000 [IU] via ORAL
  Administered 2014-09-18: 1000 [IU] via ORAL
  Administered 2014-09-18: 3000 [IU] via ORAL
  Administered 2014-09-19: 4000 [IU] via ORAL
  Filled 2014-09-16 (×6): qty 4

## 2014-09-16 MED ORDER — ROCURONIUM BROMIDE 100 MG/10ML IV SOLN
INTRAVENOUS | Status: AC
  Filled 2014-09-16: qty 1

## 2014-09-16 MED ORDER — DEXTROSE 5 % IV SOLN
500.0000 mg | Freq: Four times a day (QID) | INTRAVENOUS | Status: DC | PRN
  Administered 2014-09-16: 500 mg via INTRAVENOUS
  Filled 2014-09-16 (×2): qty 5

## 2014-09-16 MED ORDER — HYDROCODONE-ACETAMINOPHEN 10-325 MG PO TABS
1.0000 | ORAL_TABLET | ORAL | Status: DC | PRN
  Administered 2014-09-16 – 2014-09-17 (×5): 1 via ORAL
  Administered 2014-09-17: 2 via ORAL
  Administered 2014-09-17 – 2014-09-19 (×8): 1 via ORAL
  Filled 2014-09-16: qty 2
  Filled 2014-09-16 (×4): qty 1
  Filled 2014-09-16: qty 2
  Filled 2014-09-16 (×8): qty 1

## 2014-09-16 MED ORDER — HYDROMORPHONE HCL 1 MG/ML IJ SOLN
INTRAMUSCULAR | Status: DC | PRN
Start: 2014-09-16 — End: 2014-09-16
  Administered 2014-09-16 (×2): 0.5 mg via INTRAVENOUS
  Administered 2014-09-16: 1 mg via INTRAVENOUS

## 2014-09-16 MED ORDER — HYDROMORPHONE HCL 1 MG/ML IJ SOLN
1.0000 mg | INTRAMUSCULAR | Status: DC | PRN
  Administered 2014-09-16: 1 mg via INTRAVENOUS
  Filled 2014-09-16 (×2): qty 1

## 2014-09-16 MED ORDER — GLYCOPYRROLATE 0.2 MG/ML IJ SOLN
INTRAMUSCULAR | Status: AC
  Filled 2014-09-16: qty 2

## 2014-09-16 MED ORDER — FENTANYL CITRATE (PF) 250 MCG/5ML IJ SOLN
INTRAMUSCULAR | Status: AC
  Filled 2014-09-16: qty 5

## 2014-09-16 MED ORDER — LACTATED RINGERS IV SOLN
INTRAVENOUS | Status: DC
  Administered 2014-09-16 (×2): via INTRAVENOUS
  Administered 2014-09-16: 1000 mL via INTRAVENOUS

## 2014-09-16 MED ORDER — NEOSTIGMINE METHYLSULFATE 10 MG/10ML IV SOLN
INTRAVENOUS | Status: AC
  Filled 2014-09-16: qty 1

## 2014-09-16 MED ORDER — METOCLOPRAMIDE HCL 5 MG/ML IJ SOLN
INTRAMUSCULAR | Status: AC
  Filled 2014-09-16: qty 2

## 2014-09-16 MED ORDER — CEFAZOLIN SODIUM-DEXTROSE 2-3 GM-% IV SOLR
INTRAVENOUS | Status: AC
  Filled 2014-09-16: qty 50

## 2014-09-16 MED ORDER — DIPHENHYDRAMINE HCL 12.5 MG/5ML PO ELIX: 12.5000 mg | ORAL_SOLUTION | ORAL | Status: DC | PRN

## 2014-09-16 MED ORDER — DEXAMETHASONE SODIUM PHOSPHATE 10 MG/ML IJ SOLN
INTRAMUSCULAR | Status: AC
  Filled 2014-09-16: qty 1

## 2014-09-16 MED ORDER — PROPOFOL 10 MG/ML IV BOLUS
INTRAVENOUS | Status: AC
  Filled 2014-09-16: qty 20

## 2014-09-16 MED ORDER — ESMOLOL HCL 10 MG/ML IV SOLN
INTRAVENOUS | Status: DC | PRN
  Administered 2014-09-16: 10 mg via INTRAVENOUS

## 2014-09-16 MED ORDER — ASPIRIN EC 325 MG PO TBEC
325.0000 mg | DELAYED_RELEASE_TABLET | Freq: Two times a day (BID) | ORAL | Status: DC
  Administered 2014-09-16 – 2014-09-19 (×6): 325 mg via ORAL
  Filled 2014-09-16 (×8): qty 1

## 2014-09-16 MED ORDER — ROCURONIUM BROMIDE 100 MG/10ML IV SOLN
INTRAVENOUS | Status: DC | PRN
  Administered 2014-09-16: 50 mg via INTRAVENOUS

## 2014-09-16 MED ORDER — VALACYCLOVIR HCL 500 MG PO TABS
1000.0000 mg | ORAL_TABLET | Freq: Three times a day (TID) | ORAL | Status: DC | PRN
  Filled 2014-09-16: qty 2

## 2014-09-16 MED ORDER — PHENOL 1.4 % MT LIQD: 1.0000 | OROMUCOSAL | Status: DC | PRN

## 2014-09-16 MED ORDER — PROPOFOL 10 MG/ML IV BOLUS
INTRAVENOUS | Status: DC | PRN
  Administered 2014-09-16: 20 mg via INTRAVENOUS
  Administered 2014-09-16: 180 mg via INTRAVENOUS
  Administered 2014-09-16: 20 mg via INTRAVENOUS

## 2014-09-16 MED ORDER — SODIUM CHLORIDE 0.9 % IV SOLN
INTRAVENOUS | Status: DC
  Administered 2014-09-16: 75 mL/h via INTRAVENOUS
  Administered 2014-09-17: 01:00:00 via INTRAVENOUS

## 2014-09-16 MED ORDER — CEFAZOLIN SODIUM 1-5 GM-% IV SOLN
1.0000 g | Freq: Four times a day (QID) | INTRAVENOUS | Status: AC
  Administered 2014-09-16 (×2): 1 g via INTRAVENOUS
  Filled 2014-09-16 (×3): qty 50

## 2014-09-16 MED ORDER — ZOLPIDEM TARTRATE 5 MG PO TABS
5.0000 mg | ORAL_TABLET | Freq: Every evening | ORAL | Status: DC | PRN
  Administered 2014-09-16: 5 mg via ORAL
  Filled 2014-09-16: qty 1

## 2014-09-16 MED ORDER — MENTHOL 3 MG MT LOZG
1.0000 | LOZENGE | OROMUCOSAL | Status: DC | PRN
  Administered 2014-09-17: 3 mg via ORAL
  Filled 2014-09-16: qty 9

## 2014-09-16 MED ORDER — MIDAZOLAM HCL 2 MG/2ML IJ SOLN
INTRAMUSCULAR | Status: AC
  Filled 2014-09-16: qty 2

## 2014-09-16 MED ORDER — ALUM & MAG HYDROXIDE-SIMETH 200-200-20 MG/5ML PO SUSP: 30.0000 mL | ORAL | Status: DC | PRN

## 2014-09-16 MED ORDER — FENTANYL CITRATE (PF) 100 MCG/2ML IJ SOLN
INTRAMUSCULAR | Status: DC | PRN
  Administered 2014-09-16: 50 ug via INTRAVENOUS
  Administered 2014-09-16: 100 ug via INTRAVENOUS
  Administered 2014-09-16 (×7): 50 ug via INTRAVENOUS

## 2014-09-16 MED ORDER — METOCLOPRAMIDE HCL 5 MG/ML IJ SOLN: 5.0000 mg | Freq: Three times a day (TID) | INTRAMUSCULAR | Status: DC | PRN

## 2014-09-16 MED ORDER — SODIUM CHLORIDE 0.9 % IR SOLN
Status: DC | PRN
  Administered 2014-09-16: 1000 mL

## 2014-09-16 MED ORDER — LIDOCAINE HCL (CARDIAC) 20 MG/ML IV SOLN
INTRAVENOUS | Status: DC | PRN
  Administered 2014-09-16: 50 mg via INTRAVENOUS

## 2014-09-16 MED ORDER — LIDOCAINE HCL (CARDIAC) 20 MG/ML IV SOLN
INTRAVENOUS | Status: AC
  Filled 2014-09-16: qty 5

## 2014-09-16 MED ORDER — 0.9 % SODIUM CHLORIDE (POUR BTL) OPTIME
TOPICAL | Status: DC | PRN
  Administered 2014-09-16: 1000 mL

## 2014-09-16 MED ORDER — PANTOPRAZOLE SODIUM 40 MG PO TBEC
80.0000 mg | DELAYED_RELEASE_TABLET | Freq: Every day | ORAL | Status: DC
  Administered 2014-09-17 – 2014-09-19 (×3): 80 mg via ORAL
  Filled 2014-09-16 (×4): qty 2

## 2014-09-16 MED ORDER — GLYCOPYRROLATE 0.2 MG/ML IJ SOLN
INTRAMUSCULAR | Status: DC | PRN
  Administered 2014-09-16: 0.4 mg via INTRAVENOUS

## 2014-09-16 MED ORDER — FUROSEMIDE 20 MG PO TABS
20.0000 mg | ORAL_TABLET | Freq: Every day | ORAL | Status: DC
  Administered 2014-09-16 – 2014-09-19 (×4): 20 mg via ORAL
  Filled 2014-09-16 (×5): qty 1

## 2014-09-16 MED ORDER — HYDROMORPHONE HCL 1 MG/ML IJ SOLN
0.5000 mg | INTRAMUSCULAR | Status: DC | PRN
  Administered 2014-09-16 (×2): 0.5 mg via INTRAVENOUS

## 2014-09-16 MED ORDER — MIDAZOLAM HCL 5 MG/5ML IJ SOLN
INTRAMUSCULAR | Status: DC | PRN
  Administered 2014-09-16 (×2): 1 mg via INTRAVENOUS

## 2014-09-16 MED ORDER — METOCLOPRAMIDE HCL 5 MG PO TABS
5.0000 mg | ORAL_TABLET | Freq: Three times a day (TID) | ORAL | Status: DC | PRN
  Filled 2014-09-16: qty 2

## 2014-09-16 MED ORDER — FENTANYL CITRATE (PF) 100 MCG/2ML IJ SOLN
INTRAMUSCULAR | Status: AC
  Filled 2014-09-16: qty 2

## 2014-09-16 MED ORDER — ACETAMINOPHEN 650 MG RE SUPP: 650.0000 mg | Freq: Four times a day (QID) | RECTAL | Status: DC | PRN

## 2014-09-16 SURGICAL SUPPLY — 54 items
APL SKNCLS STERI-STRIP NONHPOA (GAUZE/BANDAGES/DRESSINGS)
BAG SPEC THK2 15X12 ZIP CLS (MISCELLANEOUS)
BAG ZIPLOCK 12X15 (MISCELLANEOUS) IMPLANT
BENZOIN TINCTURE PRP APPL 2/3 (GAUZE/BANDAGES/DRESSINGS) IMPLANT
BLADE SAW SGTL 18X1.27X75 (BLADE) ×2 IMPLANT
BLADE SAW SGTL 18X1.27X75MM (BLADE) ×1
CAPT HIP TOTAL 2 ×2 IMPLANT
CELLS DAT CNTRL 66122 CELL SVR (MISCELLANEOUS) ×1 IMPLANT
CLOSURE WOUND 1/2 X4 (GAUZE/BANDAGES/DRESSINGS)
COVER PERINEAL POST (MISCELLANEOUS) ×3 IMPLANT
DRAPE C-ARM 42X120 X-RAY (DRAPES) ×3 IMPLANT
DRAPE STERI IOBAN 125X83 (DRAPES) ×3 IMPLANT
DRAPE U-SHAPE 47X51 STRL (DRAPES) ×9 IMPLANT
DRSG AQUACEL AG ADV 3.5X10 (GAUZE/BANDAGES/DRESSINGS) ×3 IMPLANT
DURAPREP 26ML APPLICATOR (WOUND CARE) ×3 IMPLANT
ELECT BLADE TIP CTD 4 INCH (ELECTRODE) ×3 IMPLANT
ELECT REM PT RETURN 9FT ADLT (ELECTROSURGICAL) ×3
ELECTRODE REM PT RTRN 9FT ADLT (ELECTROSURGICAL) ×1 IMPLANT
FACESHIELD WRAPAROUND (MASK) ×12 IMPLANT
FACESHIELD WRAPAROUND OR TEAM (MASK) ×4 IMPLANT
GAUZE XEROFORM 1X8 LF (GAUZE/BANDAGES/DRESSINGS) ×2 IMPLANT
GLOVE BIO SURGEON STRL SZ7.5 (GLOVE) ×3 IMPLANT
GLOVE BIOGEL PI IND STRL 6.5 (GLOVE) IMPLANT
GLOVE BIOGEL PI IND STRL 7.5 (GLOVE) IMPLANT
GLOVE BIOGEL PI IND STRL 8 (GLOVE) ×2 IMPLANT
GLOVE BIOGEL PI INDICATOR 6.5 (GLOVE) ×4
GLOVE BIOGEL PI INDICATOR 7.5 (GLOVE) ×4
GLOVE BIOGEL PI INDICATOR 8 (GLOVE) ×4
GLOVE ECLIPSE 8.0 STRL XLNG CF (GLOVE) ×3 IMPLANT
GLOVE SURG SS PI 6.5 STRL IVOR (GLOVE) ×2 IMPLANT
GLOVE SURG SS PI 7.0 STRL IVOR (GLOVE) ×2 IMPLANT
GLOVE SURG SS PI 7.5 STRL IVOR (GLOVE) ×2 IMPLANT
GOWN BRE IMP PREV XXLGXLNG (GOWN DISPOSABLE) ×2 IMPLANT
GOWN STRL REUS W/TWL LRG LVL3 (GOWN DISPOSABLE) ×2 IMPLANT
GOWN STRL REUS W/TWL XL LVL3 (GOWN DISPOSABLE) ×8 IMPLANT
HANDPIECE INTERPULSE COAX TIP (DISPOSABLE) ×3
KIT BASIN OR (CUSTOM PROCEDURE TRAY) ×3 IMPLANT
PACK TOTAL JOINT (CUSTOM PROCEDURE TRAY) ×3 IMPLANT
PEN SKIN MARKING BROAD (MISCELLANEOUS) ×3 IMPLANT
RETRACTOR WND ALEXIS 18 MED (MISCELLANEOUS) ×1 IMPLANT
RTRCTR WOUND ALEXIS 18CM MED (MISCELLANEOUS) ×3
SET HNDPC FAN SPRY TIP SCT (DISPOSABLE) ×1 IMPLANT
STAPLER VISISTAT 35W (STAPLE) ×2 IMPLANT
STRIP CLOSURE SKIN 1/2X4 (GAUZE/BANDAGES/DRESSINGS) IMPLANT
SUT ETHIBOND NAB CT1 #1 30IN (SUTURE) ×3 IMPLANT
SUT MNCRL AB 4-0 PS2 18 (SUTURE) IMPLANT
SUT VIC AB 0 CT1 36 (SUTURE) ×3 IMPLANT
SUT VIC AB 1 CT1 36 (SUTURE) ×3 IMPLANT
SUT VIC AB 2-0 CT1 27 (SUTURE) ×9
SUT VIC AB 2-0 CT1 TAPERPNT 27 (SUTURE) ×2 IMPLANT
TOWEL OR 17X26 10 PK STRL BLUE (TOWEL DISPOSABLE) ×3 IMPLANT
TOWEL OR NON WOVEN STRL DISP B (DISPOSABLE) ×3 IMPLANT
TRAY FOLEY W/METER SILVER 14FR (SET/KITS/TRAYS/PACK) ×3 IMPLANT
YANKAUER SUCT BULB TIP 10FT TU (MISCELLANEOUS) ×3 IMPLANT

## 2014-09-16 NOTE — Op Note (Signed)
NAMETAMERRA, MERKLEY NO.:  [id number]  MEDICAL RECORD NO.:  [id number]  LOCATION:  5                         FACILITY:  [facility]  PHYSICIAN:  Lind Guest. Ninfa Linden, M.D.DATE OF BIRTH:  [birth year]  DATE OF PROCEDURE:  09/16/2014 DATE OF DISCHARGE:                              OPERATIVE REPORT   PREOPERATIVE DIAGNOSES:  Primary osteoarthritis and degenerative joint disease, left hip.  POSTOPERATIVE DIAGNOSES:  Primary osteoarthritis and degenerative joint disease, left hip.  PROCEDURE:  Left total hip arthroplasty through direct anterior approach.  IMPLANTS:  DePuy Sector Gription acetabular component size 48 with apex hole eliminator, size 32+ 4 neutral polyethylene liner, size 11 Corail femoral component with standard offset, size 32+ 1 ceramic hip ball.  SURGEON:  Lind Guest. Ninfa Linden, M.D.  ASSISTANT:  Erskine Emery, PA-C  ANESTHESIA:  General.  BLOOD LOSS:  400 mL.  ANTIBIOTICS:  2 g of IV Ancef.  COMPLICATIONS:  None.  INDICATIONS:  Ms. Koman is a very pleasant 69 year old obese female with bilateral hip osteoarthritis, this is become quite painful to her with left hip hurting her much worse than her right.  She has failed all forms of conservative treatment.  Her x-ray shows sclerotic and cystic changes in the femoral head and neck with periarticular osteophytes and joint space narrowing.  With the failure of conservative treatment, her daily pain, her decreased mobility and her decreased quality of life as well as activities of daily living that is the impact of her bad hip has had on her, she wished to proceed with the total hip arthroplasty through direct anterior approach of her left hip.  She understands the risks of acute blood loss anemia, nerve and vessel injury, fracture, infection, dislocation, and DVT.  She understands the goals are decreased pain, improved mobility, and overall improved quality of life.  PROCEDURE  DESCRIPTION:  After informed consent was obtained, appropriate left hip was marked.  She was brought to the operating room where general anesthesia was obtained while she was on her stretcher.  Foley catheter was placed and then traction boots were placed on both of her feet.  Next, she was placed supine on the Hana fracture table with the perineal post in place and both legs in inline skeletal traction devices, but no traction applied.  Her left operative hip was prepped and draped with DuraPrep and sterile drapes.  A time-out was called and she was identified as correct patient and correct left hip.  We then made an incision inferior and posterior to the anterior superior iliac spine and carried this obliquely down the leg.  We dissected down the tensor fascia lata muscle and the tensor fascia was then divided, so we could proceed with a direct anterior approach to the hip.  We identified and cauterized the lateral femoral circumflex vessels and then identified the hip capsule.  We placed the Cobra retractor around the lateral neck and up underneath the rectus femoris around the medial femoral neck.  I then opened up the hip capsule in a L-type format finding a joint effusion and significant periarticular osteophytes.  I placed the Cobra retractors within the joint capsule.  I then made my femoral neck  cut with an oscillating saw proximal to the lesser trochanter and completed this with an osteotome.  I placed a corkscrew guide in the femoral head, removed the femoral head and found it to be devoid of cartilage.  We then cleaned the acetabular, remnants of the acetabulum and debris.  Overall, we placed a bent Hohmann over the medial acetabular rim and I released the transverse acetabular ligament. I then began broaching under direct visualization from a size 42 in 2 mm increments up to a size 48 with all reamers under direct visualization and last reamer also under direct fluoroscopy, so  we could obtain our depth of reaming, our inclination and anteversion.  Once I was pleased with this, I placed the real DePuy Sector Gription acetabular component size 48 and apex hole eliminator.  I placed the real 32+ 4 neutral polyethylene liner for that size acetabular component.  Attention was then turned to the femur.  With the leg externally rotated to 100 degrees, extended and adducted, I was able to place the Mueller retractor medially and a Hohmann retractor behind the greater trochanter.  I released the lateral joint capsule and used a box cutting osteotome to enter the femoral canal and a rongeur to lateralize.  I then began broaching using the Corail broaching system from a size 8 up to a size 11.  The size 11 was felt to be stable, so we trialed a standard neck and a 32+ 1 hip ball.  We brought the leg back over and up with traction and internal rotation, reducing the pelvis and it was stable throughout its arc of rotation with minimal Shuck.  Offsets and leg lengths were measured near equal.  We then dislocated the hip and removed the trial components.  I placed the real Corail femoral component size 11 followed by the real 32+ 1 hip ball, reduced this back into the acetabulum and it was stable.  We then copiously irrigated the soft tissue with normal saline solution using pulsatile lavage.  We closed the joint capsule with interrupted #1 Ethibond suture followed by running #1 Vicryl in the tensor fascia, 0 Vicryl in the deep tissue, 2-0 Vicryl in the subcutaneous tissue, staples on the skin.  Xeroform and an Aquacel dressing were applied.  She was then taken off the Hana table, awakened, extubated and taken to the recovery room in stable condition. All final counts were correct.  There were no complications noted.  Of note, Erskine Emery, PA-C assisted in the entire case and his assistance was crucial for facilitating all aspects of this case.     Lind Guest.  Ninfa Linden, M.D.     CYB/MEDQ  D:  09/16/2014  T:  09/16/2014  Job:  [id number]

## 2014-09-16 NOTE — Transfer of Care (Signed)
Immediate Anesthesia Transfer of Care Note  Patient: [name]  Procedure(s) Performed: Procedure(s): LEFT TOTAL HIP ARTHROPLASTY ANTERIOR APPROACH (Left)  Patient Location: PACU  Anesthesia Type:General  Level of Consciousness: awake, alert , oriented and patient cooperative  Airway & Oxygen Therapy: Patient Spontanous Breathing and Patient connected to face mask oxygen  Post-op Assessment: Report given to RN and Post -op Vital signs reviewed and stable  Post vital signs: Reviewed and stable  Last Vitals:  Filed Vitals:   09/16/14 0610  BP: 131/65  Pulse: 65  Temp: 36.3 C  Resp: 18    Complications: No apparent anesthesia complications

## 2014-09-16 NOTE — Progress Notes (Signed)
Utilization review completed.  

## 2014-09-16 NOTE — Brief Op Note (Signed)
09/16/2014  10:09 AM  PATIENT:  Kalee M Somero  69 y.o. female  PRE-OPERATIVE DIAGNOSIS:  primary osteoarthritis left hip  POST-OPERATIVE DIAGNOSIS:  primary osteoarthritis left hip  PROCEDURE:  Procedure(s): LEFT TOTAL HIP ARTHROPLASTY ANTERIOR APPROACH (Left)  SURGEON:  Surgeon(s) and Role:    * Mcarthur Rossetti, MD - Primary  PHYSICIAN ASSISTANT: Benita Stabile, PA-C  ANESTHESIA:   general  EBL:  Total I/O In: 1000 [I.V.:1000] Out: 600 [Urine:200; Blood:400]  BLOOD ADMINISTERED:none  DRAINS: none   LOCAL MEDICATIONS USED:  NONE  SPECIMEN:  No Specimen  DISPOSITION OF SPECIMEN:  N/A  COUNTS:  YES  TOURNIQUET:  * No tourniquets in log *  DICTATION: .Other Dictation: Dictation Number [id number]  PLAN OF CARE: Admit to inpatient   PATIENT DISPOSITION:  PACU - hemodynamically stable.   Delay start of Pharmacological VTE agent (>24hrs) due to surgical blood loss or risk of bleeding: no

## 2014-09-16 NOTE — Anesthesia Postprocedure Evaluation (Signed)
  Anesthesia Post-op Note  Patient: [name]  Procedure(s) Performed: Procedure(s) (LRB): LEFT TOTAL HIP ARTHROPLASTY ANTERIOR APPROACH (Left)  Patient Location: PACU  Anesthesia Type: General  Level of Consciousness: awake and alert   Airway and Oxygen Therapy: Patient Spontanous Breathing  Post-op Pain: mild  Post-op Assessment: Post-op Vital signs reviewed, Patient's Cardiovascular Status Stable, Respiratory Function Stable, Patent Airway and No signs of Nausea or vomiting  Last Vitals:  Filed Vitals:   09/16/14 1445  BP: 111/79  Pulse: 81  Temp: 37.1 C  Resp: 16    Post-op Vital Signs: stable   Complications: No apparent anesthesia complications

## 2014-09-16 NOTE — Evaluation (Signed)
Physical Therapy Evaluation Patient Details Name: [name] MRN: [id number] DOB: 03-Sep-1945 Today's Date: 09/16/2014   History of Present Illness  L THR  Clinical Impression  Pt s/p L THR presents with decreased L LE strength/ROM and post op pain limiting functional mobility.  Pt would benefit from follow up rehab at SNF level to maximize IND and safety prior to return home ALONE.    Follow Up Recommendations SNF    Equipment Recommendations  Rolling walker with 5" wheels    Recommendations for Other Services       Precautions / Restrictions Precautions Precautions: Fall Restrictions Weight Bearing Restrictions: No Other Position/Activity Restrictions: WBAT      Mobility  Bed Mobility Overal bed mobility: Needs Assistance Bed Mobility: Supine to Sit;Sit to Supine     Supine to sit: Mod assist Sit to supine: Mod assist   General bed mobility comments: cues for sequence and use of R LE to self assist  Transfers Overall transfer level: Needs assistance Equipment used: Rolling walker (2 wheeled) Transfers: Sit to/from Stand Sit to Stand: Mod assist         General transfer comment: cues for LE management and use of UEs to self assist  Ambulation/Gait Ambulation/Gait assistance: Mod assist Ambulation Distance (Feet): 9 Feet Assistive device: Rolling walker (2 wheeled) Gait Pattern/deviations: Step-to pattern;Decreased step length - right;Decreased step length - left;Shuffle;Trunk flexed Gait velocity: decr   General Gait Details: Cues for sequence, posture and position from [employer]            Wheelchair Mobility    Modified Rankin (Stroke Patients Only)       Balance                                             Pertinent Vitals/Pain Pain Assessment: 0-10 Pain Score: 7  Pain Location: L hip Pain Descriptors / Indicators: Aching;Sore Pain Intervention(s): Limited activity within patient's tolerance;Monitored during  session;Premedicated before session;Ice applied    Home Living Family/patient expects to be discharged to:: Skilled nursing facility Living Arrangements: Alone                    Prior Function Level of Independence: Independent               Hand Dominance   Dominant Hand: Right    Extremity/Trunk Assessment   Upper Extremity Assessment: Overall WFL for tasks assessed           Lower Extremity Assessment: LLE deficits/detail   LLE Deficits / Details: 2+/5 with AAROM at hip to 75 flex and 15 abd  Cervical / Trunk Assessment: Normal  Communication   Communication: No difficulties  Cognition Arousal/Alertness: Awake/alert Behavior During Therapy: WFL for tasks assessed/performed Overall Cognitive Status: Within Functional Limits for tasks assessed                      General Comments      Exercises Total Joint Exercises Ankle Circles/Pumps: AROM;Both;15 reps;Supine Heel Slides: AAROM;Left;15 reps;Supine Hip ABduction/ADduction: AAROM;Left;10 reps;Supine      Assessment/Plan    PT Assessment Patient needs continued PT services  PT Diagnosis Difficulty walking   PT Problem List Decreased strength;Decreased range of motion;Decreased activity tolerance;Decreased mobility;Decreased knowledge of use of DME;Obesity;Pain  PT Treatment Interventions DME instruction;Gait training;Functional mobility training;Therapeutic activities;Therapeutic exercise;Stair training;Patient/family education  PT Goals (Current goals can be found in the Care Plan section) Acute Rehab PT Goals Patient Stated Goal: Resume previous lifestyle with decreased pain PT Goal Formulation: With patient Time For Goal Achievement: 09/23/14 Potential to Achieve Goals: Good    Frequency 7X/week   Barriers to discharge        Co-evaluation               End of Session Equipment Utilized During Treatment: Gait belt Activity Tolerance: Patient limited by pain Patient  left: in bed;with call bell/phone within reach;with family/visitor present Nurse Communication: Mobility status         Time: 4128-7867 PT Time Calculation (min) (ACUTE ONLY): 25 min   Charges:     PT Treatments $Gait Training: 8-22 mins   PT G Codes:        Naraya Stoneberg 09-28-2014, 5:12 PM

## 2014-09-16 NOTE — Progress Notes (Signed)
Clinical Social Work Department BRIEF PSYCHOSOCIAL ASSESSMENT 09/16/2014  Patient:  [name],[name]     Account Number:  [id number]     Admit date:  09/16/2014  Clinical Social Worker:  Lacie Scotts  Date/Time:  09/16/2014 03:30 PM  Referred by:  Physician  Date Referred:  09/16/2014 Referred for  SNF Placement   Other Referral:   Interview type:  Patient Other interview type:    PSYCHOSOCIAL DATA Living Status:  ALONE Admitted from facility:   Level of care:   Primary support name:  [name] Primary support relationship to patient:  FRIEND Degree of support available:   supportive    CURRENT CONCERNS Current Concerns  Post-Acute Placement   Other Concerns:    SOCIAL WORK ASSESSMENT / PLAN Pt is a 69 yr old female living at home prior to hospitalization. CSW met with pt / friend to assist with d/c planning. This is a planned admission. Pt has made prior arrangements to have ST Rehab at [facility] ) following hospital d/c. CSW has left a message for Admissions Coordinator to confirm d/c plan. Awaiting return call. CSW will continue to follow to assist with d/c planning to SNF.   Assessment/plan status:  Psychosocial Support/Ongoing Assessment of Needs Other assessment/ plan:   Information/referral to community resources:   Insurance coverage for SNF and ambulance transport reviewed.    PATIENT'S/FAMILY'S RESPONSE TO PLAN OF CARE: Pt had surgery this am. " I'Newton still sleepy. I'Newton happy it's over." Pt toured [facility] and met with [name] ( Admissions ) last week. " I really liked the facility. [name] said she would have an opening for me." Pt is looking forward to rehab at [facility]. She is motivated to begin therapy and is looking forward to feeling better.    Werner Lean LCSW [phone]

## 2014-09-16 NOTE — H&P (Signed)
TOTAL HIP ADMISSION H&P  Patient is admitted for left total hip arthroplasty.  Subjective:  Chief Complaint: left hip pain  HPI: [name], 69 y.o. female, has a history of pain and functional disability in the left hip(s) due to arthritis and patient has failed non-surgical conservative treatments for greater than 12 weeks to include NSAID's and/or analgesics, corticosteriod injections, flexibility and strengthening excercises, supervised PT with diminished ADL's post treatment, use of assistive devices, weight reduction as appropriate and activity modification.  Onset of symptoms was gradual starting 2 years ago with gradually worsening course since that time.The patient noted no past surgery on the left hip(s).  Patient currently rates pain in the left hip at 10 out of 10 with activity. Patient has night pain, worsening of pain with activity and weight bearing and pain that interfers with activities of daily living. Patient has evidence of subchondral cysts, subchondral sclerosis, periarticular osteophytes and joint space narrowing by imaging studies. This condition presents safety issues increasing the risk of falls.  There is no current active infection.  Patient Active Problem List   Diagnosis Date Noted  . Osteoarthritis of left hip 09/16/2014  . ASCUS favoring benign 09/05/2014  . History of vitamin B deficiency 05/02/2014  . Acute bronchitis 04/18/2014  . Oral aphthous ulcer 04/18/2014  . Edema 12/21/2013  . Irregular heart beat 12/21/2013  . Anuria 11/12/2013  . History of cervical dysplasia 09/21/2013  . Rectocele, female 09/21/2013  . Kidney stone 09/21/2013  . Rash and nonspecific skin eruption 06/25/2013  . Obesity (BMI 30-39.9) 06/16/2013  . IBS (irritable bowel syndrome) 12/22/2012  . Other sleep disturbances 10/27/2012  . Elevated BP 09/29/2012  . Weight gain 08/17/2012  . Osteopenia 08/17/2012  . Vulvar lesion 08/10/2012  . 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  Condyloma acuminatum in female 07/14/2012  . Shingles 07/14/2012  . Vitamin D deficiency 07/14/2012   Past Medical History  Diagnosis Date  . Arthritis   . Diverticulosis   . GERD (gastroesophageal reflux disease)   . Osteoporosis   . Tuberous sclerosis     EXTERNAL LESIONS--  MAINLY HANDS (INTERNAL SCLEROTIC BONY LESIONS LUMBAR & PELVIS PER CT 11/2013)  . Seasonal allergies   . H/O hiatal hernia   . History of diverticulitis of colon   . Nodule of right lung     BENIGN--- AND STABLE PER  CT  11/2013  . History of gastric ulcer   . History of epilepsy     childhood age 68 to 89  ---secondary to high calcium deposts of optic nerve---  none since age 60 with pregency  . IBS (irritable bowel syndrome)   . History of kidney stones   . History of shingles     MARCH 2014--  BACK AREA  . History of vulvar dysplasia     S/P  LASER ABLATION 2014  . Right ureteral stone   . Ureteral stenosis, right   . DDD (degenerative disc disease), lumbosacral   . Renal cyst, left     STABLE PER CT 11/2013  . PONV (postoperative nausea and vomiting)     occurred back in 1970's   . Anginal pain     hx of pt relates to stress last occurrence 8 to 10 years ago   . Tachycardia   . Mild obstructive sleep apnea     SLEEP STUDY  11-19-2012--  NO CPAP RX (DID NOT MEET CR[facility]IRIA)  . Shortness of breath dyspnea     walking distances  and climbing stairs  . Bronchitis     hx of   . Phlebitis     hx of     Past Surgical History  Procedure Laterality Date  . Bladder surgery  1991    bladder reconstruction of torsion  . Colonoscopy    . Upper gastrointestinal endoscopy    . Cataract extraction w/ intraocular lens  implant, bilateral    . Cholecystectomy  11-18-2003  . Cardiac catheterization  06-26-2001    NORMAL CORONARY ARTERIES  . Orif right bimalleolar ankle fx  12-18-2003  . 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 Lumbar disc surgery  1985  .  Vaginal hysterectomy  1975  . Hemorrhoid surgery  1970's  . Co2 laser application N/A 06/06/863    Procedure: CO2 LASER APPLICATION;  Surgeon: Terrance Mass, MD;  Location[facility];  Service: Gynecology;  Laterality: N/A;CO2 laser of vaginal and vulvar lesions  . Gynecologic cryosurgery    . Dilation and curettage of uterus  multiple -- last one 1975  . Augmentation mammaplasty      SALINE  . Cystoscopy with retrograde pyelogram, ureteroscopy and stent placement Right 11/22/2013    Procedure: CYSTOSCOPY WITH RETROGRADE PYELOGRAM, URETEROSCOPY AND STENT PLACEMENT;  Surgeon: Sharyn Creamer, MD;  Location: [facility];  Service: Urology;  Laterality: Right;  . Cystoscopy/retrograde/ureteroscopy Right 12/01/2013    Procedure: CYSTOSCOPY, RIGHT RETROGRADE/RIGHT DIGITAL URETEROSCOPY, RIGHT URETERAL STENT EXCHANGE;  Surgeon: Sharyn Creamer, MD;  Location[facility];  Service: Urology;  Laterality: Right;  STAGED RIGHT URETEROSCOPY RIGHT URETER DILATION    . Holmium laser application Right 12/09/4694    Procedure: HOLMIUM LASER APPLICATION;  Surgeon: Sharyn Creamer, MD;  Location: [facility];  Service: Urology;  Laterality: Right;    Prescriptions prior to admission  Medication Sig Dispense Refill Last Dose  . cholecalciferol (VITAMIN D) 1000 UNITS tablet Take 4,000 Units by mouth daily.    09/15/2014 at Unknown time  . diclofenac (VOLTAREN) 75 MG EC tablet Take 75 mg by mouth 2 (two) times daily.   09/15/2014 at Unknown time  . furosemide (LASIX) 20 MG tablet TAKE 1 TABLET BY MOUTH DAILY 90 tablet 0 09/15/2014 at Unknown time  . HYDROcodone-acetaminophen (NORCO/VICODIN) 5-325 MG per tablet Take 1 tablet by mouth at bedtime as needed for moderate pain.   09/14/2014  . omeprazole (PRILOSEC) 40 MG capsule Take 40 mg by mouth daily.   09/16/2014 at 0300  . valACYclovir (VALTREX) 1000 MG tablet Take 1 tablet (1,000 mg total) by mouth 3  (three) times daily. (Patient taking differently: Take 1,000 mg by mouth 3 (three) times daily as needed (for outbreaks). ) 21 tablet 0 More than a month at Unknown time  . Vitamin D, Ergocalciferol, (DRISDOL) 50000 UNITS CAPS capsule Take 1 capsule (50,000 Units total) by mouth every 7 (seven) days. (Patient taking differently: Take 50,000 Units by mouth 2 (two) times a week. twice weekly) 12 capsule 1 09/14/2014   Allergies  Allergen Reactions  . Percocet [Oxycodone-Acetaminophen] Nausea And Vomiting  . Zoloft [Sertraline Hcl]     Pt can not take SSRI  . Contrast Media [Iodinated Diagnostic Agents] Rash    States was oral contrast- not betadine based IV contrast    History  Substance Use Topics  . Smoking status: Former Smoker -- 2.00 packs/day for 20 years    Types: Cigarettes  Quit date: 06/03/1998  . Smokeless tobacco: Never Used  . Alcohol Use: No    Family History  Problem Relation Age of Onset  . Heart disease Mother   . Tuberous sclerosis Mother   . Tuberous sclerosis Sister   . Tuberous sclerosis Daughter   . Tuberous sclerosis Son   . Tuberous sclerosis Maternal Aunt   . Tuberous sclerosis Maternal Grandmother   . Tuberous sclerosis Maternal Aunt   . Tuberous sclerosis Maternal Aunt   . Tuberous sclerosis Cousin      Review of Systems  Musculoskeletal: Positive for back pain and joint pain.  All other systems reviewed and are negative.   Objective:  Physical Exam  Constitutional: She is oriented to person, place, and time. She appears well-developed and well-nourished.  HENT:  Head: Normocephalic and atraumatic.  Eyes: EOM are normal. Pupils are equal, round, and reactive to light.  Neck: Normal range of motion. Neck supple.  Cardiovascular: Normal rate and regular rhythm.   Respiratory: Effort normal and breath sounds normal.  GI: Soft. Bowel sounds are normal.  Musculoskeletal:       Left hip: She exhibits decreased range of motion, decreased strength,  tenderness and bony tenderness.  Neurological: She is alert and oriented to person, place, and time.  Skin: Skin is warm and dry.  Psychiatric: She has a normal mood and affect.    Vital signs in last 24 hours: Temp:  [97.4 F (36.3 C)] 97.4 F (36.3 C) (04/15 0610) Pulse Rate:  [65] 65 (04/15 0610) Resp:  [18] 18 (04/15 0610) BP: (131)/(65) 131/65 mmHg (04/15 0610) SpO2:  [100 %] 100 % (04/15 0610) Weight:  [91.627 kg (202 lb)] 91.627 kg (202 lb) (04/15 0654)  Labs:   Estimated body mass index is 38.19 kg/(m^2) as calculated from the following:   Height as of this encounter: 5\' 1"  (1.549 m).   Weight as of this encounter: 91.627 kg (202 lb).   Imaging Review Plain radiographs demonstrate moderate degenerative joint disease of the left hip(s). The bone quality appears to be good for age and reported activity level.  Assessment/Plan:  End stage arthritis, left hip(s)  The patient history, physical examination, clinical judgement of the provider and imaging studies are consistent with end stage degenerative joint disease of the left hip(s) and total hip arthroplasty is deemed medically necessary. The treatment options including medical management, injection therapy, arthroscopy and arthroplasty were discussed at length. The risks and benefits of total hip arthroplasty were presented and reviewed. The risks due to aseptic loosening, infection, stiffness, dislocation/subluxation,  thromboembolic complications and other imponderables were discussed.  The patient acknowledged the explanation, agreed to proceed with the plan and consent was signed. Patient is being admitted for inpatient treatment for surgery, pain control, PT, OT, prophylactic antibiotics, VTE prophylaxis, progressive ambulation and ADL's and discharge planning.The patient is planning to be discharged home with home health services

## 2014-09-16 NOTE — Anesthesia Preprocedure Evaluation (Addendum)
Anesthesia Evaluation  Patient identified by MRN, date of birth, ID band Patient awake    Reviewed: Allergy & Precautions, NPO status , Patient's Chart, lab work & pertinent test results  History of Anesthesia Complications (+) PONV and history of anesthetic complications  Airway Mallampati: II  TM Distance: >3 FB Neck ROM: Full    Dental no notable dental hx.    Pulmonary shortness of breath, sleep apnea , former smoker,  Mild OSA breath sounds clear to auscultation  Pulmonary exam normal       Cardiovascular Exercise Tolerance: Good + angina with exertion Rhythm:Regular Rate:Normal  ECHO: 09-07-07:SUMMARY - Overall left ventricular systolic function was normal. Left    ventricular ejection fraction was estimated to be 65 %. There    were no left ventricular regional wall motion abnormalities. - The right ventricle was mildly dilated.    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 She thinks she does have tuberous lesions in her back negative psych ROS   GI/Hepatic Neg liver ROS, hiatal hernia, GERD-  Medicated,  Endo/Other  negative endocrine ROS  Renal/GU Renal disease  negative genitourinary   Musculoskeletal  (+) Arthritis -,   Abdominal   Peds negative pediatric ROS (+)  Hematology negative hematology ROS (+)   Anesthesia Other Findings   Reproductive/Obstetrics negative OB ROS                          Anesthesia Physical Anesthesia Plan  ASA: III  Anesthesia Plan: General   Post-op Pain Management:    Induction: Intravenous  Airway Management Planned: Oral ETT  Additional Equipment:   Intra-op Plan:   Post-operative Plan: Extubation in OR  Informed Consent: I have reviewed the patients History and Physical, chart, labs and discussed the procedure including the risks, benefits and alternatives for the proposed anesthesia with the patient or  authorized representative who has indicated his/her understanding and acceptance.   Dental advisory given  Plan Discussed with: CRNA  Anesthesia Plan Comments: (Discussed spinal and general. She thinks her lumbar spine is affected. Plan general, she agrees. She states she has done fine with general anesthesia except for PONV)       Anesthesia Quick Evaluation

## 2014-09-17 LAB — CBC
HCT: 30.6 % — ABNORMAL LOW (ref 36.0–46.0)
Hemoglobin: 10.3 g/dL — ABNORMAL LOW (ref 12.0–15.0)
MCH: 30.5 pg (ref 26.0–34.0)
MCHC: 33.7 g/dL (ref 30.0–36.0)
MCV: 90.5 fL (ref 78.0–100.0)
Platelets: 157 10*3/uL (ref 150–400)
RBC: 3.38 MIL/uL — AB (ref 3.87–5.11)
RDW: 13.6 % (ref 11.5–15.5)
WBC: 9.2 10*3/uL (ref 4.0–10.5)

## 2014-09-17 LAB — BASIC METABOLIC PANEL
ANION GAP: 6 (ref 5–15)
BUN: 13 mg/dL (ref 6–23)
CO2: 25 mmol/L (ref 19–32)
CREATININE: 0.62 mg/dL (ref 0.50–1.10)
Calcium: 8.3 mg/dL — ABNORMAL LOW (ref 8.4–10.5)
Chloride: 105 mmol/L (ref 96–112)
GFR calc non Af Amer: >90 mL/min (ref >90)
GLUCOSE: 119 mg/dL — AB (ref 70–99)
Potassium: 3.6 mmol/L (ref 3.5–5.1)
Sodium: 136 mmol/L (ref 135–145)

## 2014-09-17 NOTE — Progress Notes (Signed)
Subjective: 1 Day Post-Op Procedure(s) (LRB): LEFT TOTAL HIP ARTHROPLASTY ANTERIOR APPROACH (Left) Patient reports pain as moderate.  Acute blood loss anemia from surgery, but asymptomatic thus far.  Already working with therapy.  Objective: Vital signs in last 24 hours: Temp:  [97.5 F (36.4 C)-99.1 F (37.3 C)] 98.8 F (37.1 C) (04/16 0518) Pulse Rate:  [71-105] 105 (04/16 0518) Resp:  [10-18] 18 (04/16 0518) BP: (111-158)/(51-81) 128/58 mmHg (04/16 0518) SpO2:  [96 %-100 %] 96 % (04/16 0518) Weight:  [91.627 kg (202 lb)] 91.627 kg (202 lb) (04/15 1145)  Intake/Output from previous day: 04/15 0701 - 04/16 0700 In: 2045 [P.O.:240; I.V.:1750; IV Piggyback:55] Out: 1880 [Urine:1480; Blood:400] Intake/Output this shift:     Recent Labs  09/17/14 0435  HGB 10.3*    Recent Labs  09/17/14 0435  WBC 9.2  RBC 3.38*  HCT 30.6*  PLT 157    Recent Labs  09/17/14 0435  NA 136  K 3.6  CL 105  CO2 25  BUN 13  CREATININE 0.62  GLUCOSE 119*  CALCIUM 8.3*   No results for input(s): LABPT, INR in the last 72 hours.  Sensation intact distally Intact pulses distally Dorsiflexion/Plantar flexion intact Incision: scant drainage  Assessment/Plan: 1 Day Post-Op Procedure(s) (LRB): LEFT TOTAL HIP ARTHROPLASTY ANTERIOR APPROACH (Left) Up with therapy Discharge to [facility] Monday.  Annarae Macnair Y 09/17/2014, 7:02 AM

## 2014-09-17 NOTE — Progress Notes (Signed)
OT Note  Patient Details Name: ANELY SPIEWAK MRN: [id number] DOB: 1946/04/07   Cancelled Treatment:    Reason Eval/Treat Not Completed: Other (comment) (All OT needs can be met in the next venue of care -- SNF, which is the discharge plan.)  Ladislaus Repsher A 09/17/2014, 8:25 AM

## 2014-09-17 NOTE — Progress Notes (Signed)
Physical Therapy Treatment Patient Details Name: [name] MRN: [id number] DOB: [birth year] Today's Date: 19-Sep-2014    History of Present Illness L THR    PT Comments      Follow Up Recommendations  SNF     Equipment Recommendations  Rolling walker with 5" wheels    Recommendations for Other Services OT consult     Precautions / Restrictions Precautions Precautions: Fall Restrictions Weight Bearing Restrictions: No Other Position/Activity Restrictions: WBAT    Mobility  Bed Mobility Overal bed mobility: Needs Assistance Bed Mobility: Supine to Sit;Sit to Supine     Supine to sit: Min assist;Mod assist Sit to supine: Min assist   General bed mobility comments: cues for sequence and use of R LE to self assist  Transfers Overall transfer level: Needs assistance Equipment used: Rolling walker (2 wheeled) Transfers: Sit to/from Stand Sit to Stand: Min assist;From elevated surface         General transfer comment: cues for LE management and use of UEs to self assist  Ambulation/Gait Ambulation/Gait assistance: Min assist Ambulation Distance (Feet): 54 Feet Assistive device: Rolling walker (2 wheeled) Gait Pattern/deviations: Step-to pattern;Decreased step length - right;Decreased step length - left;Shuffle;Trunk flexed Gait velocity: decr   General Gait Details: Cues for sequence, posture and position from RW.  Assist for balance, support and initially to advance L LE   Stairs            Wheelchair Mobility    Modified Rankin (Stroke Patients Only)       Balance                                    Cognition Arousal/Alertness: Awake/alert Behavior During Therapy: WFL for tasks assessed/performed Overall Cognitive Status: Within Functional Limits for tasks assessed                      Exercises      General Comments        Pertinent Vitals/Pain Pain Assessment: 0-10 Pain Score: 6  Pain Location: L  hip Pain Descriptors / Indicators: Aching;Burning Pain Intervention(s): Limited activity within patient's tolerance;Monitored during session;Premedicated before session;Ice applied    Home Living                      Prior Function            PT Goals (current goals can now be found in the care plan section) Acute Rehab PT Goals Patient Stated Goal: Resume previous lifestyle with decreased pain PT Goal Formulation: With patient Time For Goal Achievement: 09/23/14 Potential to Achieve Goals: Good Progress towards PT goals: Progressing toward goals    Frequency  7X/week    PT Plan Current plan remains appropriate    Co-evaluation             End of Session Equipment Utilized During Treatment: Gait belt Activity Tolerance: Patient limited by pain Patient left: in bed;with call bell/phone within reach;with family/visitor present     Time: 4098-1191 PT Time Calculation (min) (ACUTE ONLY): 16 min  Charges:  $Gait Training: 8-22 mins                    G Codes:      [name] 09-19-2014, 5:11 PM

## 2014-09-17 NOTE — Care Management Note (Signed)
    Page 1 of 1   09/17/2014     11:29:06 AM CARE MANAGEMENT NOTE 09/17/2014  Patient:  [name],[name]   Account Number:  [id number]  Date Initiated:  09/17/2014  Documentation initiated by:  [name]  Subjective/Objective Assessment:   69 y/o f admitted w/l hip oa.     Action/Plan:   from home.   Anticipated DC Date:  09/18/2014   Anticipated DC Plan:  [place]  CM consult      Choice offered to / List presented to:             Status of service:  In process, will continue to follow Medicare Important Message given?   (If response is "NO", the following Medicare IM given date fields will be blank) Date Medicare IM given:   Medicare IM given by:   Date Additional Medicare IM given:   Additional Medicare IM given by:    Discharge Disposition:    Per UR Regulation:  Reviewed for med. necessity/level of care/duration of stay  If discussed at [place] of Stay Meetings, dates discussed:    Comments:  09/17/14 Samantha Phi RN BSN NCM [phone] PT-SNf.d/c plans snf, CSW already following.

## 2014-09-17 NOTE — Progress Notes (Signed)
Physical Therapy Treatment Patient Details Name: [name] MRN: [id number] DOB: 11-01-1945 Today's Date: 2014/09/28    History of Present Illness L THR    PT Comments    Motivated and progressing well with mobility.  Follow Up Recommendations  SNF     Equipment Recommendations  Rolling walker with 5" wheels    Recommendations for Other Services OT consult     Precautions / Restrictions Precautions Precautions: Fall Restrictions Weight Bearing Restrictions: No Other Position/Activity Restrictions: WBAT    Mobility  Bed Mobility Overal bed mobility: Needs Assistance Bed Mobility: Supine to Sit     Supine to sit: Min assist     General bed mobility comments: cues for sequence and use of R LE to self assist  Transfers Overall transfer level: Needs assistance Equipment used: Rolling walker (2 wheeled) Transfers: Sit to/from Stand Sit to Stand: Mod assist         General transfer comment: cues for LE management and use of UEs to self assist  Ambulation/Gait Ambulation/Gait assistance: Min assist Ambulation Distance (Feet): 26 Feet Assistive device: Rolling walker (2 wheeled) Gait Pattern/deviations: Step-to pattern;Decreased step length - right;Decreased step length - left;Shuffle;Decreased stride length Gait velocity: decr   General Gait Details: Cues for sequence, posture and position from [employer]            Wheelchair Mobility    Modified Rankin (Stroke Patients Only)       Balance                                    Cognition Arousal/Alertness: Awake/alert Behavior During Therapy: WFL for tasks assessed/performed Overall Cognitive Status: Within Functional Limits for tasks assessed                      Exercises Total Joint Exercises Ankle Circles/Pumps: AROM;Both;15 reps;Supine Quad Sets: AROM;Both;10 reps;Supine Heel Slides: AAROM;Left;Supine;20 reps Hip ABduction/ADduction: AAROM;Left;Supine;15  reps    General Comments        Pertinent Vitals/Pain Pain Assessment: 0-10 Pain Score: 6  Pain Location: L hip Pain Descriptors / Indicators: Aching;Burning Pain Intervention(s): Limited activity within patient's tolerance;Monitored during session;Premedicated before session;Ice applied    Home Living                      Prior Function            PT Goals (current goals can now be found in the care plan section) Acute Rehab PT Goals Patient Stated Goal: Resume previous lifestyle with decreased pain PT Goal Formulation: With patient Time For Goal Achievement: 09/23/14 Potential to Achieve Goals: Good Progress towards PT goals: Progressing toward goals    Frequency  7X/week    PT Plan Current plan remains appropriate    Co-evaluation             End of Session Equipment Utilized During Treatment: Gait belt Activity Tolerance: Patient limited by pain Patient left: in chair;with call bell/phone within reach     Time: 0754-0825 PT Time Calculation (min) (ACUTE ONLY): 31 min  Charges:  $Gait Training: 8-22 mins $Therapeutic Exercise: 8-22 mins                    G Codes:      Kester Stimpson 09/28/14, 1:07 PM

## 2014-09-18 LAB — CBC
HCT: 29.6 % — ABNORMAL LOW (ref 36.0–46.0)
HEMOGLOBIN: 10 g/dL — AB (ref 12.0–15.0)
MCH: 30.6 pg (ref 26.0–34.0)
MCHC: 33.8 g/dL (ref 30.0–36.0)
MCV: 90.5 fL (ref 78.0–100.0)
PLATELETS: 123 10*3/uL — AB (ref 150–400)
RBC: 3.27 MIL/uL — ABNORMAL LOW (ref 3.87–5.11)
RDW: 13.4 % (ref 11.5–15.5)
WBC: 8.2 10*3/uL (ref 4.0–10.5)

## 2014-09-18 NOTE — Progress Notes (Signed)
Physical Therapy Treatment Patient Details Name: [name] MRN: [id number] DOB: [birth year] Today's Date: 09/18/2014    History of Present Illness L THR    PT Comments    POD # 2 pm session.  Assisted getting pt out of recliner to amb to BR to void then amb in hallway.  Pt cont to c/o "burning" in L distal hip incision.    Follow Up Recommendations  SNF ([facility])     Equipment Recommendations       Recommendations for Other Services       Precautions / Restrictions Precautions Precautions: Fall Restrictions Weight Bearing Restrictions: No Other Position/Activity Restrictions: WBAT    Mobility  Bed Mobility Overal bed mobility: Needs Assistance Bed Mobility: Supine to Sit     Supine to sit: Min assist;Mod assist     General bed mobility comments: cues for sequence and use of R LE to self assist  Transfers Overall transfer level: Needs assistance Equipment used: Rolling walker (2 wheeled) Transfers: Sit to/from Stand Sit to Stand: Min assist;From elevated surface         General transfer comment: cues for LE management and use of UEs to self assist  Ambulation/Gait Ambulation/Gait assistance: Min assist Ambulation Distance (Feet): 52Feet Assistive device: Rolling walker (2 wheeled) Gait Pattern/deviations: Step-to pattern;Step-through pattern;Trunk flexed Gait velocity: decreased   General Gait Details: Cues for sequence, posture and position from RW.  Assist for balance   Stairs            Wheelchair Mobility    Modified Rankin (Stroke Patients Only)       Balance                                    Cognition Arousal/Alertness: Awake/alert Behavior During Therapy: WFL for tasks assessed/performed Overall Cognitive Status: Within Functional Limits for tasks assessed                      Exercises      General Comments        Pertinent Vitals/Pain Pain Assessment: 0-10 Pain Score: 7  Pain  Location: L hip "burning" Pain Descriptors / Indicators: Aching;Burning Pain Intervention(s): Monitored during session;Premedicated before session;Repositioned;Ice applied    Home Living                      Prior Function            PT Goals (current goals can now be found in the care plan section) Progress towards PT goals: Progressing toward goals    Frequency  7X/week    PT Plan Current plan remains appropriate    Co-evaluation             End of Session Equipment Utilized During Treatment: Gait belt Activity Tolerance: Patient limited by pain;Patient limited by fatigue Patient left: in chair;with call bell/phone within reach;with family/visitor present     Time: 1435-1500 PT Time Calculation (min) (ACUTE ONLY): 25 min  Charges:  $Gait Training: 8-22 mins                     G Codes:      Rica Koyanagi  PTA [facility]  Acute  Rehab Pager      [phone]

## 2014-09-18 NOTE — Progress Notes (Signed)
Physical Therapy Treatment Patient Details Name: [name] MRN: [id number] DOB: 11-14-1945 Today's Date: 09/18/2014    History of Present Illness L THR    PT Comments    POD # 2 am session.  Assisted OOb to amb in hallway then back to bed per pt request.  Perform THR TE's and pt declined use of ICE.  "Makes it burn more".   Follow Up Recommendations  SNF ([facility])     Equipment Recommendations       Recommendations for Other Services       Precautions / Restrictions Precautions Precautions: Fall Restrictions Weight Bearing Restrictions: No Other Position/Activity Restrictions: WBAT    Mobility  Bed Mobility Overal bed mobility: Needs Assistance Bed Mobility: Supine to Sit     Supine to sit: Min assist;Mod assist     General bed mobility comments: cues for sequence and use of R LE to self assist  Transfers Overall transfer level: Needs assistance Equipment used: Rolling walker (2 wheeled) Transfers: Sit to/from Stand Sit to Stand: Min assist;From elevated surface         General transfer comment: cues for LE management and use of UEs to self assist  Ambulation/Gait Ambulation/Gait assistance: Min assist Ambulation Distance (Feet): 45 Feet Assistive device: Rolling walker (2 wheeled) Gait Pattern/deviations: Step-to pattern;Step-through pattern;Trunk flexed Gait velocity: decreased   General Gait Details: Cues for sequence, posture and position from RW.  Assist for balance   Stairs            Wheelchair Mobility    Modified Rankin (Stroke Patients Only)       Balance                                    Cognition Arousal/Alertness: Awake/alert Behavior During Therapy: WFL for tasks assessed/performed Overall Cognitive Status: Within Functional Limits for tasks assessed                      Exercises   Total Hip Replacement TE's 10 reps ankle pumps 10 reps knee presses 10 reps heel slides AAROM 10  reps SAQ's 10 reps ABD AAROM Followed by ICE    General Comments        Pertinent Vitals/Pain Pain Assessment: 0-10 Pain Score: 7  Pain Location: L hip "burning" Pain Descriptors / Indicators: Aching;Burning Pain Intervention(s): Monitored during session;Premedicated before session;Repositioned;Ice applied    Home Living                      Prior Function            PT Goals (current goals can now be found in the care plan section) Progress towards PT goals: Progressing toward goals    Frequency  7X/week    PT Plan Current plan remains appropriate    Co-evaluation             End of Session Equipment Utilized During Treatment: Gait belt Activity Tolerance: Patient limited by pain;Patient limited by fatigue Patient left: in chair;with call bell/phone within reach;with family/visitor present     Time: 1135-1200 PT Time Calculation (min) (ACUTE ONLY): 25 min  Charges:  $Gait Training: 8-22 mins $Therapeutic Exercise: 8-22 mins                    G Codes:      Rica Koyanagi  PTA [facility]  Acute  Rehab Pager      [phone]

## 2014-09-18 NOTE — Progress Notes (Signed)
Subjective: 2 Days Post-Op Procedure(s) (LRB): LEFT TOTAL HIP ARTHROPLASTY ANTERIOR APPROACH (Left) Patient reports pain as moderate.  Working well with therapy.  Objective: Vital signs in last 24 hours: Temp:  [97.7 F (36.5 C)-100.2 F (37.9 C)] 100.2 F (37.9 C) (04/17 0650) Pulse Rate:  [79-112] 112 (04/17 0650) Resp:  [16-20] 18 (04/17 0000) BP: (106-154)/(46-89) 129/71 mmHg (04/17 0650) SpO2:  [93 %-100 %] 93 % (04/17 0650)  Intake/Output from previous day: 04/16 0701 - 04/17 0700 In: 960 [P.O.:960] Out: 2350 [Urine:2350] Intake/Output this shift: Total I/O In: 360 [P.O.:360] Out: 1050 [Urine:1050]   Recent Labs  09/17/14 0435  HGB 10.3*    Recent Labs  09/17/14 0435  WBC 9.2  RBC 3.38*  HCT 30.6*  PLT 157    Recent Labs  09/17/14 0435  NA 136  K 3.6  CL 105  CO2 25  BUN 13  CREATININE 0.62  GLUCOSE 119*  CALCIUM 8.3*   No results for input(s): LABPT, INR in the last 72 hours.  Sensation intact distally Intact pulses distally Dorsiflexion/Plantar flexion intact Incision: dressing C/D/I  Assessment/Plan: 2 Days Post-Op Procedure(s) (LRB): LEFT TOTAL HIP ARTHROPLASTY ANTERIOR APPROACH (Left) Up with therapy Plan for discharge tomorrow to SNF  [facility] 09/18/2014, 6:53 AM

## 2014-09-18 NOTE — Progress Notes (Signed)
Pt c/o some mild swelling and pain in both upper thighs,refuses to wear scd's and /or turn side to side with pillows for support and comfort.Enc to cont to do bed exercises instructed  by PT. Aggie Moats D

## 2014-09-19 ENCOUNTER — Encounter (HOSPITAL_COMMUNITY): Payer: Self-pay | Admitting: Orthopaedic Surgery

## 2014-09-19 DIAGNOSIS — K5900 Constipation, unspecified: Secondary | ICD-10-CM | POA: Diagnosis not present

## 2014-09-19 DIAGNOSIS — K5790 Diverticulosis of intestine, part unspecified, without perforation or abscess without bleeding: Secondary | ICD-10-CM | POA: Diagnosis not present

## 2014-09-19 DIAGNOSIS — J302 Other seasonal allergic rhinitis: Secondary | ICD-10-CM | POA: Diagnosis not present

## 2014-09-19 DIAGNOSIS — Z96641 Presence of right artificial hip joint: Secondary | ICD-10-CM | POA: Diagnosis not present

## 2014-09-19 DIAGNOSIS — R600 Localized edema: Secondary | ICD-10-CM | POA: Diagnosis not present

## 2014-09-19 DIAGNOSIS — K589 Irritable bowel syndrome without diarrhea: Secondary | ICD-10-CM | POA: Diagnosis not present

## 2014-09-19 DIAGNOSIS — R0600 Dyspnea, unspecified: Secondary | ICD-10-CM | POA: Diagnosis not present

## 2014-09-19 DIAGNOSIS — D649 Anemia, unspecified: Secondary | ICD-10-CM | POA: Diagnosis not present

## 2014-09-19 DIAGNOSIS — M25572 Pain in left ankle and joints of left foot: Secondary | ICD-10-CM | POA: Diagnosis not present

## 2014-09-19 DIAGNOSIS — Z87442 Personal history of urinary calculi: Secondary | ICD-10-CM | POA: Diagnosis not present

## 2014-09-19 DIAGNOSIS — Z96642 Presence of left artificial hip joint: Secondary | ICD-10-CM | POA: Diagnosis not present

## 2014-09-19 DIAGNOSIS — M810 Age-related osteoporosis without current pathological fracture: Secondary | ICD-10-CM | POA: Diagnosis not present

## 2014-09-19 DIAGNOSIS — M5137 Other intervertebral disc degeneration, lumbosacral region: Secondary | ICD-10-CM | POA: Diagnosis not present

## 2014-09-19 DIAGNOSIS — K219 Gastro-esophageal reflux disease without esophagitis: Secondary | ICD-10-CM | POA: Diagnosis not present

## 2014-09-19 DIAGNOSIS — R911 Solitary pulmonary nodule: Secondary | ICD-10-CM | POA: Diagnosis not present

## 2014-09-19 DIAGNOSIS — M1612 Unilateral primary osteoarthritis, left hip: Secondary | ICD-10-CM | POA: Diagnosis not present

## 2014-09-19 DIAGNOSIS — D62 Acute posthemorrhagic anemia: Secondary | ICD-10-CM | POA: Diagnosis not present

## 2014-09-19 DIAGNOSIS — Q851 Tuberous sclerosis: Secondary | ICD-10-CM | POA: Diagnosis not present

## 2014-09-19 DIAGNOSIS — M1990 Unspecified osteoarthritis, unspecified site: Secondary | ICD-10-CM | POA: Diagnosis not present

## 2014-09-19 LAB — CBC
HEMATOCRIT: 29.1 % — AB (ref 36.0–46.0)
Hemoglobin: 9.8 g/dL — ABNORMAL LOW (ref 12.0–15.0)
MCH: 30.4 pg (ref 26.0–34.0)
MCHC: 33.7 g/dL (ref 30.0–36.0)
MCV: 90.4 fL (ref 78.0–100.0)
PLATELETS: 129 10*3/uL — AB (ref 150–400)
RBC: 3.22 MIL/uL — ABNORMAL LOW (ref 3.87–5.11)
RDW: 13.3 % (ref 11.5–15.5)
WBC: 6.7 10*3/uL (ref 4.0–10.5)

## 2014-09-19 MED ORDER — DOCUSATE SODIUM 100 MG PO CAPS: 100.0000 mg | ORAL_CAPSULE | Freq: Two times a day (BID) | ORAL | Status: DC

## 2014-09-19 MED ORDER — HYDROCODONE-ACETAMINOPHEN 5-325 MG PO TABS: 1.0000 | ORAL_TABLET | Freq: Every evening | ORAL | Status: DC | PRN

## 2014-09-19 MED ORDER — ASPIRIN 325 MG PO TBEC: 325.0000 mg | DELAYED_RELEASE_TABLET | Freq: Two times a day (BID) | ORAL | Status: DC

## 2014-09-19 NOTE — Discharge Instructions (Signed)
Pickup stool softener for constipation. Left lower extremity Weight Bearing as tolerated  No  Left hip precautions Progress activities slowly Expect left hip and possible left knee soreness and swelling. Apply heat or ice as needed. Keep left hip dressing clean dry and intact, may shower with dressing intact.

## 2014-09-19 NOTE — Progress Notes (Signed)
Physical Therapy Treatment Patient Details Name: [name] MRN: [id number] DOB: 03/22/1946 Today's Date: 09/19/2014    History of Present Illness L THR    PT Comments    POD # 3 am session.  Pt dressed and eager to D/C to SNF today.  Assisted out od recliner to amb a greater distance in hallway and returned to room to perform THR TE's followed by ICE.   Follow Up Recommendations  SNF ([facility])     Equipment Recommendations       Recommendations for Other Services       Precautions / Restrictions Precautions Precautions: Fall Restrictions Weight Bearing Restrictions: No Other Position/Activity Restrictions: WBAT    Mobility  Bed Mobility               General bed mobility comments: Pt OOB in recliner  Transfers Overall transfer level: Needs assistance Equipment used: Rolling walker (2 wheeled)   Sit to Stand: Min guard         General transfer comment: cues for LE management and use of UEs to self assist  Ambulation/Gait Ambulation/Gait assistance: Min guard Ambulation Distance (Feet): 52 Feet Assistive device: Rolling walker (2 wheeled) Gait Pattern/deviations: Step-to pattern;Step-through pattern Gait velocity: decreased   General Gait Details: Cues for sequence, posture and position from RW.  Assist for balance  able to perform more step through with increased distance.    Stairs            Wheelchair Mobility    Modified Rankin (Stroke Patients Only)       Balance                                    Cognition Arousal/Alertness: Awake/alert Behavior During Therapy: WFL for tasks assessed/performed Overall Cognitive Status: Within Functional Limits for tasks assessed                      Exercises   Total Hip Replacement TE's 10 reps ankle pumps 10 reps knee presses 10 reps heel slides 10 reps SAQ's 10 reps ABD Followed by ICE     General Comments        Pertinent Vitals/Pain Pain  Assessment: 0-10 Pain Score: 4  Pain Location: L hip burning Pain Descriptors / Indicators: Aching;Burning Pain Intervention(s): Monitored during session;Premedicated before session;Repositioned;Ice applied (pt agreed to try ICE)    Home Living                      Prior Function            PT Goals (current goals can now be found in the care plan section)      Frequency  7X/week    PT Plan      Co-evaluation             End of Session Equipment Utilized During Treatment: Gait belt Activity Tolerance: Patient tolerated treatment well Patient left: in chair;with call bell/phone within reach;with family/visitor present     Time: 1045-1110 PT Time Calculation (min) (ACUTE ONLY): 25 min  Charges:  $Gait Training: 8-22 mins $Therapeutic Exercise: 8-22 mins                    G Codes:      [name]  PTA [facility]  Acute  Rehab Pager      [phone]

## 2014-09-19 NOTE — Progress Notes (Signed)
Subjective: 3 Days Post-Op Procedure(s) (LRB): LEFT TOTAL HIP ARTHROPLASTY ANTERIOR APPROACH (Left) Patient reports pain as moderate.  States worst pain with getting up to ambulate . Otherwise doing well .  Objective: Vital signs in last 24 hours: Temp:  [98.3 F (36.8 C)-100.7 F (38.2 C)] 99 F (37.2 C) (04/18 0559) Pulse Rate:  [71-76] 71 (04/18 0559) Resp:  [16] 16 (04/18 0559) BP: (128-134)/(55-67) 134/67 mmHg (04/18 0559) SpO2:  [98 %-100 %] 99 % (04/18 0559)  Intake/Output from previous day: 04/17 0701 - 04/18 0700 In: 1200 [P.O.:1200] Out: -  Intake/Output this shift:     Recent Labs  09/17/14 0435 09/18/14 0650 09/19/14 0430  HGB 10.3* 10.0* 9.8*    Recent Labs  09/18/14 0650 09/19/14 0430  WBC 8.2 6.7  RBC 3.27* 3.22*  HCT 29.6* 29.1*  PLT 123* 129*    Recent Labs  09/17/14 0435  NA 136  K 3.6  CL 105  CO2 25  BUN 13  CREATININE 0.62  GLUCOSE 119*  CALCIUM 8.3*   No results for input(s): LABPT, INR in the last 72 hours.  Sensation intact distally Intact pulses distally Dorsiflexion/Plantar flexion intact Incision: scant drainage Compartment soft  Assessment/Plan: 3 Days Post-Op Procedure(s) (LRB): LEFT TOTAL HIP ARTHROPLASTY ANTERIOR APPROACH (Left) Discharge to SNF  Follow up with Dr. Ninfa Linden in 2 weeks.  [place] 09/19/2014, 8:16 AM

## 2014-09-19 NOTE — Progress Notes (Signed)
Pt d/c to [facility]. Medicated for pain at d/c. Report given to Caren Griffins, Therapist, sports.

## 2014-09-19 NOTE — Discharge Summary (Signed)
Patient ID: [name] MRN: [id number] DOB/AGE: 1946-01-10 69 y.o.  Admit date: 09/16/2014 Discharge date: 09/19/2014  Admission Diagnoses:  Principal Problem:   Osteoarthritis of left hip Active Problems:   Status post total replacement of left hip   Discharge Diagnoses:  S/p left Total hip Arthroplasty Acute blood loss anemia secondary to surgery   Past Medical History  Diagnosis Date  . Arthritis   . Diverticulosis   . GERD (gastroesophageal reflux disease)   . Osteoporosis   . Tuberous sclerosis     EXTERNAL LESIONS--  MAINLY HANDS (INTERNAL SCLEROTIC BONY LESIONS LUMBAR & PELVIS PER CT 11/2013)  . Seasonal allergies   . H/O hiatal hernia   . History of diverticulitis of colon   . Nodule of right lung     BENIGN--- AND STABLE PER  CT  11/2013  . History of gastric ulcer   . History of epilepsy     childhood age 63 to 45  ---secondary to high calcium deposts of optic nerve---  none since age 76 with pregency  . IBS (irritable bowel syndrome)   . History of kidney stones   . History of shingles     MARCH 2014--  BACK AREA  . History of vulvar dysplasia     S/P  LASER ABLATION 2014  . Right ureteral stone   . Ureteral stenosis, right   . DDD (degenerative disc disease), lumbosacral   . Renal cyst, left     STABLE PER CT 11/2013  . PONV (postoperative nausea and vomiting)     occurred back in 1970's   . Anginal pain     hx of pt relates to stress last occurrence 8 to 10 years ago   . Tachycardia   . Mild obstructive sleep apnea     SLEEP STUDY  11-19-2012--  NO CPAP RX (DID NOT MEET CRITIRIA)  . Shortness of breath dyspnea     walking distances and climbing stairs  . Bronchitis     hx of   . Phlebitis     hx of     Surgeries: Procedure(s): LEFT TOTAL HIP ARTHROPLASTY ANTERIOR APPROACH on 09/16/2014   Consultants:    Discharged Condition: Improved  Hospital Course: [name] is an 69 y.o. female who was admitted 09/16/2014 for operative  treatment ofOsteoarthritis of left hip. Patient has severe unremitting pain that affects sleep, daily activities, and work/hobbies. After pre-op clearance the patient was taken to the operating room on 09/16/2014 and underwent  Procedure(s): LEFT TOTAL HIP ARTHROPLASTY ANTERIOR APPROACH.    Patient was given perioperative antibiotics: Anti-infectives    Start     Dose/Rate Route Frequency Ordered Stop   09/16/14 1600  ceFAZolin (ANCEF) IVPB 1 g/50 mL premix     1 g 100 mL/hr over 30 Minutes Intravenous Every 6 hours 09/16/14 1200 09/16/14 2252   09/16/14 1200  valACYclovir (VALTREX) tablet 1,000 mg     1,000 mg Oral 3 times daily PRN 09/16/14 1200     09/16/14 0638  ceFAZolin (ANCEF) IVPB 2 g/50 mL premix     2 g 100 mL/hr over 30 Minutes Intravenous On call to O.R. 09/16/14 0350 09/16/14 0843       Patient was given sequential compression devices, early ambulation, and chemoprophylaxis to prevent DVT.  Patient benefited maximally from hospital stay and there were no complications.    Recent vital signs: Patient Vitals for the past 24 hrs:  BP Temp Temp src Pulse Resp SpO2  09/19/14 0559 134/67 mmHg 99 F (37.2 C) Oral 71 16 99 %  09/18/14 2300 - 100.2 F (37.9 C) - - - -  09/18/14 2200 128/62 mmHg (!) 100.7 F (38.2 C) Oral 76 16 100 %  09/18/14 1521 (!) 132/55 mmHg 98.3 F (36.8 C) Oral 75 16 98 %     Recent laboratory studies:  Recent Labs  09/17/14 0435 09/18/14 0650 09/19/14 0430  WBC 9.2 8.2 6.7  HGB 10.3* 10.0* 9.8*  HCT 30.6* 29.6* 29.1*  PLT 157 123* 129*  NA 136  --   --   K 3.6  --   --   CL 105  --   --   CO2 25  --   --   BUN 13  --   --   CREATININE 0.62  --   --   GLUCOSE 119*  --   --   CALCIUM 8.3*  --   --      Discharge Medications:     Medication List    TAKE these medications        aspirin 325 MG EC tablet  Take 1 tablet (325 mg total) by mouth 2 (two) times daily after a meal.     cholecalciferol 1000 UNITS tablet  Commonly known  as:  VITAMIN D  Take 4,000 Units by mouth daily.     diclofenac 75 MG EC tablet  Commonly known as:  VOLTAREN  Take 75 mg by mouth 2 (two) times daily.     docusate sodium 100 MG capsule  Commonly known as:  COLACE  Take 1 capsule (100 mg total) by mouth 2 (two) times daily.     furosemide 20 MG tablet  Commonly known as:  LASIX  TAKE 1 TABLET BY MOUTH DAILY     HYDROcodone-acetaminophen 5-325 MG per tablet  Commonly known as:  NORCO/VICODIN  Take 1 tablet by mouth at bedtime as needed for moderate pain.     omeprazole 40 MG capsule  Commonly known as:  PRILOSEC  Take 40 mg by mouth daily.     valACYclovir 1000 MG tablet  Commonly known as:  VALTREX  Take 1 tablet (1,000 mg total) by mouth 3 (three) times daily.     Vitamin D (Ergocalciferol) 50000 UNITS Caps capsule  Commonly known as:  DRISDOL  Take 1 capsule (50,000 Units total) by mouth every 7 (seven) days.        Diagnostic Studies: US Transvaginal Non-ob  2014-09-18   Ultrasound today: Absent uterus: Vaginal cuff normal. Right and left ovary not visualized.  No apparent adnexal masses.   Dg C-arm 1-60 Min-no Report  09/16/2014   CLINICAL DATA: left anterior hip   C-ARM 1-60 MINUTES  Fluoroscopy was utilized by the requesting physician.  No radiographic  interpretation.    Dg Hip Unilat With Pelvis 1v Left  09/16/2014   CLINICAL DATA:  Status post total hip replacement  EXAM: DG C-ARM 1-60 MIN - NRPT [facility]; LEFT HIP 1 VIEW  COMPARISON:  None.  FLUOROSCOPY TIME:  Fluoroscopy Time:  0 min 28 seconds  Number of Acquired Images:  2  FINDINGS: There is a total hip replacement with the prosthetic components appearing well-seated on frontal view. No fracture or dislocation.  IMPRESSION: Prosthetic components appear well seated on frontal view. No fracture or dislocation.   Electronically Signed   By: Lowella Grip III M.D.   On: 09/16/2014 12:36   Dg Hip Port Unilat With Pelvis 1v Left  09/16/2014   CLINICAL DATA:   Postoperative evaluation after left total hip arthroplasty  EXAM: LEFT HIP (WITH PELVIS) 1 VIEW PORTABLE  COMPARISON:  Intraoperative imaging same date  FINDINGS: Expected postoperative appearance after left total hip arthroplasty. No fracture line or evidence for hardware failure is identified. Overlying skin staples and soft tissue gas are identified. Moderate right hip degenerative change also noted.  IMPRESSION: Expected postoperative appearance after total hip arthroplasty.   Electronically Signed   By: Conchita Paris M.D.   On: 09/16/2014 11:17    [place] facility       Discharge Instructions    Discharge wound care:    Complete by:  As directed   Keep dressing clean and intact. May shower with dressing intact left hip.     Weight bearing as tolerated    Complete by:  As directed   No hip precautions.  Laterality:  left  Extremity:  Lower           Follow-up Information    Follow up with Mcarthur Rossetti, MD. Schedule an appointment as soon as possible for a visit in 2 weeks.   Specialty:  Orthopedic Surgery   Contact information:   [place] [address] [phone]        Signed: Erskine Emery 09/19/2014, 7:55 AM

## 2014-09-19 NOTE — Progress Notes (Signed)
Clinical Social Work Department CLINICAL SOCIAL WORK PLACEMENT NOTE 09/19/2014  Patient:  [name],[name]  Account Number:  [id number] Admit date:  09/16/2014  Clinical Social Worker:  Werner Lean, LCSW  Date/time:  09/16/2014 03:51 PM  Clinical Social Work is seeking post-discharge placement for this patient at the following level of care:   SKILLED NURSING   (*CSW will update this form in Epic as items are completed)     Patient/family provided with [place] Department of Clinical Social Work's list of facilities offering this level of care within the geographic area requested by the patient (or if unable, by the patient's family).  09/16/2014  Patient/family informed of their freedom to choose among providers that offer the needed level of care, that participate in Medicare, Medicaid or managed care program needed by the patient, have an available bed and are willing to accept the patient.    Patient/family informed of [facility]' ownership interest in [facility], as well as of the fact that they are under no obligation to receive care at this facility.  PASARR submitted to EDS on 09/16/2014 PASARR number received on 09/16/2014  FL2 transmitted to all facilities in geographic area requested by pt/family on  09/16/2014 FL2 transmitted to all facilities within larger geographic area on   Patient informed that his/her managed care company has contracts with or will negotiate with  certain facilities, including the following:     Patient/family informed of bed offers received:  09/19/2014 Patient chooses bed at [facility], [place] Physician recommends and patient chooses bed at    Patient to be transferred to [place] on  09/19/2014 Patient to be transferred to facility by [facility] Patient and family notified of transfer on 09/19/2014 Name of family member notified:  Pt contacted son directly.  The following  physician request were entered in Epic:   Additional Comments: Pt is in agreement with d/c to SNF today. PT approved transport by car. NSG reviewed d/c summary, scripts, avs. Scripts included in d/c packet. Packet provided to pt prior to d/c.  Werner Lean LCSW [phone]

## 2014-09-20 ENCOUNTER — Telehealth: Payer: Self-pay | Admitting: *Deleted

## 2014-09-20 DIAGNOSIS — K219 Gastro-esophageal reflux disease without esophagitis: Secondary | ICD-10-CM | POA: Diagnosis not present

## 2014-09-20 DIAGNOSIS — K5900 Constipation, unspecified: Secondary | ICD-10-CM | POA: Diagnosis not present

## 2014-09-20 DIAGNOSIS — D649 Anemia, unspecified: Secondary | ICD-10-CM | POA: Diagnosis not present

## 2014-09-20 DIAGNOSIS — R0600 Dyspnea, unspecified: Secondary | ICD-10-CM | POA: Diagnosis not present

## 2014-09-20 DIAGNOSIS — Z96642 Presence of left artificial hip joint: Secondary | ICD-10-CM | POA: Diagnosis not present

## 2014-09-20 NOTE — Telephone Encounter (Signed)
No -- she just needs her reg scheduled appt

## 2014-09-20 NOTE — Telephone Encounter (Signed)
Patient was in hospital for L hip replacement, discharge instructions state to follow-up with orthopedic surgery.  Would you like a follow-up visit as well?

## 2014-09-27 ENCOUNTER — Encounter: Payer: Medicare Other | Admitting: Gynecology

## 2014-10-02 DIAGNOSIS — M810 Age-related osteoporosis without current pathological fracture: Secondary | ICD-10-CM | POA: Diagnosis not present

## 2014-10-02 DIAGNOSIS — R600 Localized edema: Secondary | ICD-10-CM | POA: Diagnosis not present

## 2014-10-02 DIAGNOSIS — R911 Solitary pulmonary nodule: Secondary | ICD-10-CM | POA: Diagnosis not present

## 2014-10-02 DIAGNOSIS — M5137 Other intervertebral disc degeneration, lumbosacral region: Secondary | ICD-10-CM | POA: Diagnosis not present

## 2014-10-02 DIAGNOSIS — D62 Acute posthemorrhagic anemia: Secondary | ICD-10-CM | POA: Diagnosis not present

## 2014-10-02 DIAGNOSIS — Z96641 Presence of right artificial hip joint: Secondary | ICD-10-CM | POA: Diagnosis not present

## 2014-10-02 DIAGNOSIS — Z96642 Presence of left artificial hip joint: Secondary | ICD-10-CM | POA: Diagnosis not present

## 2014-10-02 DIAGNOSIS — K589 Irritable bowel syndrome without diarrhea: Secondary | ICD-10-CM | POA: Diagnosis not present

## 2014-10-02 DIAGNOSIS — M25572 Pain in left ankle and joints of left foot: Secondary | ICD-10-CM | POA: Diagnosis not present

## 2014-10-02 DIAGNOSIS — Q851 Tuberous sclerosis: Secondary | ICD-10-CM | POA: Diagnosis not present

## 2014-10-02 DIAGNOSIS — K219 Gastro-esophageal reflux disease without esophagitis: Secondary | ICD-10-CM | POA: Diagnosis not present

## 2014-10-02 DIAGNOSIS — K5790 Diverticulosis of intestine, part unspecified, without perforation or abscess without bleeding: Secondary | ICD-10-CM | POA: Diagnosis not present

## 2014-10-02 DIAGNOSIS — M1990 Unspecified osteoarthritis, unspecified site: Secondary | ICD-10-CM | POA: Diagnosis not present

## 2014-10-02 DIAGNOSIS — Z87442 Personal history of urinary calculi: Secondary | ICD-10-CM | POA: Diagnosis not present

## 2014-10-02 DIAGNOSIS — M1612 Unilateral primary osteoarthritis, left hip: Secondary | ICD-10-CM | POA: Diagnosis not present

## 2014-10-02 DIAGNOSIS — J302 Other seasonal allergic rhinitis: Secondary | ICD-10-CM | POA: Diagnosis not present

## 2014-10-04 DIAGNOSIS — M1612 Unilateral primary osteoarthritis, left hip: Secondary | ICD-10-CM | POA: Diagnosis not present

## 2014-10-08 DIAGNOSIS — Z9181 History of falling: Secondary | ICD-10-CM | POA: Diagnosis not present

## 2014-10-08 DIAGNOSIS — Z7901 Long term (current) use of anticoagulants: Secondary | ICD-10-CM | POA: Diagnosis not present

## 2014-10-08 DIAGNOSIS — Z471 Aftercare following joint replacement surgery: Secondary | ICD-10-CM | POA: Diagnosis not present

## 2014-10-08 DIAGNOSIS — Z96642 Presence of left artificial hip joint: Secondary | ICD-10-CM | POA: Diagnosis not present

## 2014-10-08 DIAGNOSIS — Z5181 Encounter for therapeutic drug level monitoring: Secondary | ICD-10-CM | POA: Diagnosis not present

## 2014-10-08 DIAGNOSIS — M5136 Other intervertebral disc degeneration, lumbar region: Secondary | ICD-10-CM | POA: Diagnosis not present

## 2014-10-10 DIAGNOSIS — I499 Cardiac arrhythmia, unspecified: Secondary | ICD-10-CM | POA: Diagnosis not present

## 2014-10-10 DIAGNOSIS — Z9181 History of falling: Secondary | ICD-10-CM | POA: Diagnosis not present

## 2014-10-10 DIAGNOSIS — Z96642 Presence of left artificial hip joint: Secondary | ICD-10-CM | POA: Diagnosis not present

## 2014-10-10 DIAGNOSIS — Z7901 Long term (current) use of anticoagulants: Secondary | ICD-10-CM | POA: Diagnosis not present

## 2014-10-10 DIAGNOSIS — M5136 Other intervertebral disc degeneration, lumbar region: Secondary | ICD-10-CM | POA: Diagnosis not present

## 2014-10-10 DIAGNOSIS — Z471 Aftercare following joint replacement surgery: Secondary | ICD-10-CM | POA: Diagnosis not present

## 2014-10-10 DIAGNOSIS — Z5181 Encounter for therapeutic drug level monitoring: Secondary | ICD-10-CM | POA: Diagnosis not present

## 2014-10-10 DIAGNOSIS — E559 Vitamin D deficiency, unspecified: Secondary | ICD-10-CM | POA: Diagnosis not present

## 2014-10-11 ENCOUNTER — Telehealth: Payer: Self-pay | Admitting: Internal Medicine

## 2014-10-11 NOTE — Telephone Encounter (Signed)
Received records from [facility] for appointment with Dr Debara Pickett on 10/12/14.  Records given to [facility] (medical records) for Dr Lysbeth Penner schedule on 10/12/14. lp

## 2014-10-12 ENCOUNTER — Encounter (INDEPENDENT_AMBULATORY_CARE_PROVIDER_SITE_OTHER): Payer: Medicare Other

## 2014-10-12 ENCOUNTER — Encounter: Payer: Self-pay | Admitting: Internal Medicine

## 2014-10-12 ENCOUNTER — Ambulatory Visit (INDEPENDENT_AMBULATORY_CARE_PROVIDER_SITE_OTHER): Payer: Medicare Other | Admitting: Internal Medicine

## 2014-10-12 VITALS — BP 128/80 | HR 103 | Ht 60.0 in | Wt 201.2 lb

## 2014-10-12 DIAGNOSIS — R9431 Abnormal electrocardiogram [ECG] [EKG]: Secondary | ICD-10-CM | POA: Diagnosis not present

## 2014-10-12 DIAGNOSIS — R002 Palpitations: Secondary | ICD-10-CM

## 2014-10-12 DIAGNOSIS — R079 Chest pain, unspecified: Secondary | ICD-10-CM

## 2014-10-12 DIAGNOSIS — I498 Other specified cardiac arrhythmias: Secondary | ICD-10-CM | POA: Diagnosis not present

## 2014-10-12 DIAGNOSIS — I491 Atrial premature depolarization: Secondary | ICD-10-CM

## 2014-10-12 DIAGNOSIS — I499 Cardiac arrhythmia, unspecified: Secondary | ICD-10-CM

## 2014-10-12 DIAGNOSIS — Q851 Tuberous sclerosis: Secondary | ICD-10-CM

## 2014-10-12 NOTE — Progress Notes (Signed)
OFFICE NOTE  Chief Complaint:  Chest pain, irregular heartbeat  Primary Care Physician: Reginia Naas, MD  HPI:  [name] is a pleasant 69 year old female who is kindly referred to me for evaluation of an irregular heartbeat. She recently underwent left hip replacement by Dr. Ninfa Linden and is scheduled for right hip replacement. Recently she's been having some chest discomfort. She describes this as across the chest however mostly localized in the left chest. She could clearly point to an area where it was and feels that her chest is sore. Is not always associated with exertion or relieved by rest. Some of the symptoms of occurred after her hip surgery. She's had this from time to time over the years and has had a number of stress test. Her last stress test was in 2014. She has seen other cardiologists in the area remotely. Recently she was noted to have an irregular heartbeat and rehabilitation. There was concern for atrial fibrillation. She's been on warfarin for her hip replacement and that has been continued due to the possibility of her increased risk of stroke. Her CHADSVASC score is 2, given that she is a female and over age 20. Is not clear that she has any history of hypertension. Her EKG today does not show atrial fibrillation, rather ectopic atrial rhythm with PACs and a pattern of bigeminy. However this does suggest sinus node dysfunction and could put her at increased risk for A. fib.  PMHx:  Past Medical History  Diagnosis Date  . Arthritis   . Diverticulosis   . GERD (gastroesophageal reflux disease)   . Osteoporosis   . Tuberous sclerosis     EXTERNAL LESIONS--  MAINLY HANDS (INTERNAL SCLEROTIC BONY LESIONS LUMBAR & PELVIS PER CT 11/2013)  . Seasonal allergies   . H/O hiatal hernia   . History of diverticulitis of colon   . Nodule of right lung     BENIGN--- AND STABLE PER  CT  11/2013  . History of gastric ulcer   . History of epilepsy     childhood age  27 to 64  ---secondary to high calcium deposts of optic nerve---  none since age 57 with pregency  . IBS (irritable bowel syndrome)   . History of kidney stones   . History of shingles     MARCH 2014--  BACK AREA  . History of vulvar dysplasia     S/P  LASER ABLATION 2014  . Right ureteral stone   . Ureteral stenosis, right   . DDD (degenerative disc disease), lumbosacral   . Renal cyst, left     STABLE PER CT 11/2013  . PONV (postoperative nausea and vomiting)     occurred back in 1970's   . Anginal pain     hx of pt relates to stress last occurrence 8 to 10 years ago   . Tachycardia   . Mild obstructive sleep apnea     SLEEP STUDY  11-19-2012--  NO CPAP RX (DID NOT MEET CRITIRIA)  . Shortness of breath dyspnea     walking distances and climbing stairs  . Bronchitis     hx of   . Phlebitis     hx of     Past Surgical History  Procedure Laterality Date  . Bladder surgery  1991    bladder reconstruction of torsion  . Colonoscopy    . Upper gastrointestinal endoscopy    . Cataract extraction w/ intraocular lens  implant, bilateral    .  Cholecystectomy  11-18-2003  . Cardiac catheterization  06-26-2001    NORMAL CORONARY ARTERIES  . Orif right bimalleolar ankle fx  12-18-2003  . 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 Lumbar disc surgery  1985  . Vaginal hysterectomy  1975  . Hemorrhoid surgery  1970's  . Co2 laser application N/A 0/12/3708    Procedure: CO2 LASER APPLICATION;  Surgeon: Terrance Mass, MD;  Location: [facility];  Service: Gynecology;  Laterality: N/A;CO2 laser of vaginal and vulvar lesions  . Gynecologic cryosurgery    . Dilation and curettage of uterus  multiple -- last one 1975  . Augmentation mammaplasty      SALINE  . Cystoscopy with retrograde pyelogram, ureteroscopy and stent placement Right 11/22/2013    Procedure: CYSTOSCOPY WITH RETROGRADE PYELOGRAM, URETEROSCOPY AND STENT  PLACEMENT;  Surgeon: Sharyn Creamer, MD;  Location: [facility];  Service: Urology;  Laterality: Right;  . Cystoscopy/retrograde/ureteroscopy Right 12/01/2013    Procedure: CYSTOSCOPY, RIGHT RETROGRADE/RIGHT DIGITAL URETEROSCOPY, RIGHT URETERAL STENT EXCHANGE;  Surgeon: Sharyn Creamer, MD;  Location: [facility];  Service: Urology;  Laterality: Right;  STAGED RIGHT URETEROSCOPY RIGHT URETER DILATION    . Holmium laser application Right 11/03/6946    Procedure: HOLMIUM LASER APPLICATION;  Surgeon: Sharyn Creamer, MD;  Location: [facility];  Service: Urology;  Laterality: Right;  . Total hip arthroplasty Left 09/16/2014    Procedure: LEFT TOTAL HIP ARTHROPLASTY ANTERIOR APPROACH;  Surgeon: Mcarthur Rossetti, MD;  Location: [facility];  Service: Orthopedics;  Laterality: Left;    FAMHx:  Family History  Problem Relation Age of Onset  . Heart disease Mother   . Tuberous sclerosis Mother   . Tuberous sclerosis Sister   . Tuberous sclerosis Daughter   . Tuberous sclerosis Son   . Tuberous sclerosis Maternal Aunt   . Tuberous sclerosis Maternal Grandmother   . Tuberous sclerosis Maternal Aunt   . Tuberous sclerosis Maternal Aunt   . Tuberous sclerosis Cousin     SOCHx:   reports that she quit smoking about 16 years ago. Her smoking use included Cigarettes. She has a 40 pack-year smoking history. She has never used smokeless tobacco. She reports that she does not drink alcohol or use illicit drugs.  ALLERGIES:  Allergies  Allergen Reactions  . Percocet [Oxycodone-Acetaminophen] Nausea And Vomiting  . Zoloft [Sertraline Hcl]     Pt can not take SSRI  . Contrast Media [Iodinated Diagnostic Agents] Rash    States was oral contrast- not betadine based IV contrast    ROS: A comprehensive review of systems was negative except for: Cardiovascular: positive for chest pressure/discomfort, irregular heart beat and palpitations  HOME  MEDS: Current Outpatient Prescriptions  Medication Sig Dispense Refill  . cholecalciferol (VITAMIN D) 1000 UNITS tablet Take 4,000 Units by mouth daily.     . cyclobenzaprine (FLEXERIL) 10 MG tablet Take 10 mg by mouth 3 (three) times daily as needed for muscle spasms.    . furosemide (LASIX) 20 MG tablet TAKE 1 TABLET BY MOUTH DAILY 90 tablet 0  . HYDROcodone-acetaminophen (NORCO/VICODIN) 5-325 MG per tablet Take 1 tablet by mouth at bedtime as needed for moderate pain. 60 tablet 0  . meloxicam (MOBIC) 15 MG tablet Take 15 mg by mouth daily.    Marland Kitchen omeprazole (PRILOSEC) 40 MG capsule Take 40 mg by mouth daily.    . valACYclovir (VALTREX) 1000  MG tablet Take 1 tablet (1,000 mg total) by mouth 3 (three) times daily. (Patient taking differently: Take 1,000 mg by mouth 3 (three) times daily as needed (for outbreaks). ) 21 tablet 0  . Vitamin D, Ergocalciferol, (DRISDOL) 50000 UNITS CAPS capsule Take 50,000 Units by mouth 2 (two) times a week.    . warfarin (COUMADIN) 5 MG tablet Take by mouth daily.     No current facility-administered medications for this visit.    LABS/IMAGING: No results found for this or any previous visit (from the past 48 hour(s)). No results found.  WEIGHTS: Wt Readings from Last 3 Encounters:  10/12/14 201 lb 3.2 oz (91.264 kg)  09/16/14 202 lb (91.627 kg)  08/17/14 201 lb (91.173 kg)    VITALS: BP 128/80 mmHg  Pulse 103  Ht 5' (1.524 m)  Wt 201 lb 3.2 oz (91.264 kg)  BMI 39.29 kg/m2  EXAM: General appearance: alert and no distress Neck: no carotid bruit and no JVD Lungs: clear to auscultation bilaterally Heart: regularly irregular rhythm Abdomen: soft, non-tender; bowel sounds normal; no masses,  no organomegaly Extremities: extremities normal, atraumatic, no cyanosis or edema Pulses: 2+ and symmetric Skin: Skin color, texture, turgor normal. No rashes or lesions Neurologic: Grossly normal Psych: Pleasant  EKG: Ectopic atrial rhythm with atrial  bigeminy at 103  ASSESSMENT: 1. Chest pain 2. Ectopic atrial rhythm with atrial bigeminy  PLAN: 1.   Mrs. Ramseur presents with an irregular heartbeat and is found to be an ectopic atrial rhythm with atrial bigeminy. It is unclear whether she is having atrial fibrillation, however we need to be suspicious of this. I would recommend a two-week monitor to evaluate for occult atrial fibrillation. If there is no clear evidence for this, we could consider having her come off of warfarin and go on aspirin. We would need clear evidence for her to stay on anticoagulations such as warfarin or a DOAC long-term. In addition she's been having chest discomfort which does sound atypical, however I would recommend the stress test to further evaluate this. Given her history of recent hip surgery and tuberous sclerosis, she cannot exercise. I'm recommending a Lexiscan nuclear stress test. Plan to see her back to discuss results of her monitor and stress test in a few weeks.  Thanks for the kind referral. Please don't hesitate to contact me if you have any further questions.  Pixie Casino, MD, [facility] Attending Cardiologist [place] Hilty 10/12/2014, 9:33 AM

## 2014-10-12 NOTE — Patient Instructions (Signed)
Your physician has requested that you have a lexiscan myoview. For further information please visit [email or web address]. Please follow instruction sheet, as given.  Your physician has recommended that you wear an event monitor for 14 days. Event monitors are medical devices that record the heart's electrical activity. Doctors most often [place] these monitors to diagnose arrhythmias. Arrhythmias are problems with the speed or rhythm of the heartbeat. The monitor is a small, portable device. You can wear one while you do your normal daily activities. This is usually used to diagnose what is causing palpitations/syncope (passing out).  Your physician recommends that you schedule a follow-up appointment after your test/monitor   TIPS -  REMINDERS for monitor 1. The sensor is the lanyard that is worn around your neck every day - this is powered by a battery that needs to be changed every day 2. The monitor is the device that allows you to record symptoms - this will need to be charged daily 3. The sensor & monitor need to be within 100 feet of each other at all times 4. The sensor connects to the electrodes (stickers) - these should be changed every 24-48 hours (you do not have to remove them when you bathe, just make sure they are dry when you connect it back to the sensor 5. If you need more supplies (electrodes, batteries), please call the 1-800 # on the back of the pamphlet and [employer] will mail you more supplies 6. If your skin becomes sensitive, please try the sample pack of sensitive skin electrodes (the white packet in your silver box) and call [employer] to have them mail you more of these type of electrodes 7. When you are finish wearing the monitor, please place all supplies back in the silver box, place the silver box in the pre-packaged UPS bag and drop off at [employer] or call them so they can come pick it up   Cardiac Event Monitoring A cardiac event monitor is a small recording device used to  help detect abnormal heart rhythms (arrhythmias). The monitor is used to record heart rhythm when noticeable symptoms such as the following occur:  Fast heartbeats (palpitations), such as heart racing or fluttering.  Dizziness.  Fainting or light-headedness.  Unexplained weakness. The monitor is wired to two electrodes placed on your chest. Electrodes are flat, sticky disks that attach to your skin. The monitor can be worn for up to 30 days. You will wear the monitor at all times, except when bathing.  HOW TO USE YOUR CARDIAC EVENT MONITOR A technician will prepare your chest for the electrode placement. The technician will show you how to place the electrodes, how to work the monitor, and how to replace the batteries. Take time to practice using the monitor before you leave the office. Make sure you understand how to send the information from the monitor to your health care provider. This requires a telephone with a landline, not a cell phone. You need to:  Wear your monitor at all times, except when you are in water:  Do not get the monitor wet.  Take the monitor off when bathing. Do not swim or use a hot tub with it on.  Keep your skin clean. Do not put body lotion or moisturizer on your chest.  Change the electrodes daily or any time they stop sticking to your skin. You might need to use tape to keep them on.  It is possible that your skin under the electrodes could become irritated. To keep  this from happening, try to put the electrodes in slightly different places on your chest. However, they must remain in the area under your left breast and in the upper right section of your chest.  Make sure the monitor is safely clipped to your clothing or in a location close to your body that your health care provider recommends.  Press the button to record when you feel symptoms of heart trouble, such as dizziness, weakness, light-headedness, palpitations, thumping, shortness of breath,  unexplained weakness, or a fluttering or racing heart. The monitor is always on and records what happened slightly before you pressed the button, so do not worry about being too late to get good information.  Keep a diary of your activities, such as walking, doing chores, and taking medicine. It is especially important to note what you were doing when you pushed the button to record your symptoms. This will help your health care provider determine what might be contributing to your symptoms. The information stored in your monitor will be reviewed by your health care provider alongside your diary entries.  Send the recorded information as recommended by your health care provider. It is important to understand that it will take some time for your health care provider to process the results.  Change the batteries as recommended by your health care provider. SEEK IMMEDIATE MEDICAL CARE IF:   You have chest pain.  You have extreme difficulty breathing or shortness of breath.  You develop a very fast heartbeat that persists.  You develop dizziness that does not go away.  You faint or constantly feel you are about to faint. Document Released: 02/27/2008 Document Revised: 10/04/2013 Document Reviewed: 11/16/2012 [facility] Patient Information 2015 [place], [place]. This information is not intended to replace advice given to you by your health care provider. Make sure you discuss any questions you have with your health care provider.

## 2014-10-13 ENCOUNTER — Telehealth: Payer: Self-pay | Admitting: Internal Medicine

## 2014-10-13 DIAGNOSIS — Z96642 Presence of left artificial hip joint: Secondary | ICD-10-CM | POA: Diagnosis not present

## 2014-10-13 DIAGNOSIS — Z5181 Encounter for therapeutic drug level monitoring: Secondary | ICD-10-CM | POA: Diagnosis not present

## 2014-10-13 DIAGNOSIS — M5136 Other intervertebral disc degeneration, lumbar region: Secondary | ICD-10-CM | POA: Diagnosis not present

## 2014-10-13 DIAGNOSIS — Z471 Aftercare following joint replacement surgery: Secondary | ICD-10-CM | POA: Diagnosis not present

## 2014-10-13 DIAGNOSIS — Z7901 Long term (current) use of anticoagulants: Secondary | ICD-10-CM | POA: Diagnosis not present

## 2014-10-13 DIAGNOSIS — Z9181 History of falling: Secondary | ICD-10-CM | POA: Diagnosis not present

## 2014-10-13 NOTE — Telephone Encounter (Signed)
Please call,question about monitor she is wearing.

## 2014-10-13 NOTE — Telephone Encounter (Signed)
Pt had question r/t event monitor - questions addressed. Recommended pt call cardionet if anything additional. She voiced understanding, thanks for the return call.

## 2014-10-16 ENCOUNTER — Emergency Department (HOSPITAL_COMMUNITY): Payer: Medicare Other

## 2014-10-16 ENCOUNTER — Other Ambulatory Visit (HOSPITAL_COMMUNITY): Payer: Self-pay

## 2014-10-16 ENCOUNTER — Encounter (HOSPITAL_COMMUNITY)
Admission: EM | Disposition: A | Discharge status: Home / Self Care | Payer: Self-pay | Source: Home / Self Care | Attending: Emergency Medicine

## 2014-10-16 ENCOUNTER — Emergency Department (HOSPITAL_COMMUNITY): Payer: Medicare Other | Admitting: Certified Registered"

## 2014-10-16 ENCOUNTER — Emergency Department (HOSPITAL_COMMUNITY)
Admission: EM | Admit: 2014-10-16 | Discharge: 2014-10-16 | Disposition: A | Discharge status: Home / Self Care | Payer: Medicare Other | Source: Home / Self Care | Attending: Emergency Medicine | Admitting: Emergency Medicine

## 2014-10-16 ENCOUNTER — Encounter (HOSPITAL_COMMUNITY): Payer: Self-pay

## 2014-10-16 DIAGNOSIS — T84012D Broken internal right knee prosthesis, subsequent encounter: Secondary | ICD-10-CM | POA: Diagnosis not present

## 2014-10-16 DIAGNOSIS — Y792 Prosthetic and other implants, materials and accessory orthopedic devices associated with adverse incidents: Secondary | ICD-10-CM | POA: Insufficient documentation

## 2014-10-16 DIAGNOSIS — T84021A Dislocation of internal left hip prosthesis, initial encounter: Principal | ICD-10-CM | POA: Diagnosis present

## 2014-10-16 DIAGNOSIS — Z91041 Radiographic dye allergy status: Secondary | ICD-10-CM | POA: Insufficient documentation

## 2014-10-16 DIAGNOSIS — Z886 Allergy status to analgesic agent status: Secondary | ICD-10-CM | POA: Insufficient documentation

## 2014-10-16 DIAGNOSIS — Z888 Allergy status to other drugs, medicaments and biological substances status: Secondary | ICD-10-CM

## 2014-10-16 DIAGNOSIS — T84012A Broken internal right knee prosthesis, initial encounter: Secondary | ICD-10-CM | POA: Diagnosis not present

## 2014-10-16 DIAGNOSIS — Z7901 Long term (current) use of anticoagulants: Secondary | ICD-10-CM

## 2014-10-16 DIAGNOSIS — D62 Acute posthemorrhagic anemia: Secondary | ICD-10-CM | POA: Diagnosis not present

## 2014-10-16 DIAGNOSIS — G4733 Obstructive sleep apnea (adult) (pediatric): Secondary | ICD-10-CM

## 2014-10-16 DIAGNOSIS — Z96642 Presence of left artificial hip joint: Secondary | ICD-10-CM | POA: Insufficient documentation

## 2014-10-16 DIAGNOSIS — J302 Other seasonal allergic rhinitis: Secondary | ICD-10-CM | POA: Diagnosis present

## 2014-10-16 DIAGNOSIS — Z9049 Acquired absence of other specified parts of digestive tract: Secondary | ICD-10-CM | POA: Insufficient documentation

## 2014-10-16 DIAGNOSIS — Z90710 Acquired absence of both cervix and uterus: Secondary | ICD-10-CM | POA: Insufficient documentation

## 2014-10-16 DIAGNOSIS — Y999 Unspecified external cause status: Secondary | ICD-10-CM | POA: Diagnosis not present

## 2014-10-16 DIAGNOSIS — Z87891 Personal history of nicotine dependence: Secondary | ICD-10-CM

## 2014-10-16 DIAGNOSIS — M25562 Pain in left knee: Secondary | ICD-10-CM | POA: Diagnosis not present

## 2014-10-16 DIAGNOSIS — Z79891 Long term (current) use of opiate analgesic: Secondary | ICD-10-CM

## 2014-10-16 DIAGNOSIS — Z9889 Other specified postprocedural states: Secondary | ICD-10-CM

## 2014-10-16 DIAGNOSIS — S73035A Other anterior dislocation of left hip, initial encounter: Secondary | ICD-10-CM

## 2014-10-16 DIAGNOSIS — K219 Gastro-esophageal reflux disease without esophagitis: Secondary | ICD-10-CM | POA: Diagnosis present

## 2014-10-16 DIAGNOSIS — S73005A Unspecified dislocation of left hip, initial encounter: Secondary | ICD-10-CM | POA: Diagnosis not present

## 2014-10-16 DIAGNOSIS — M24452 Recurrent dislocation, left hip: Secondary | ICD-10-CM | POA: Diagnosis not present

## 2014-10-16 DIAGNOSIS — Z87442 Personal history of urinary calculi: Secondary | ICD-10-CM | POA: Insufficient documentation

## 2014-10-16 DIAGNOSIS — Z885 Allergy status to narcotic agent status: Secondary | ICD-10-CM

## 2014-10-16 DIAGNOSIS — M810 Age-related osteoporosis without current pathological fracture: Secondary | ICD-10-CM | POA: Diagnosis present

## 2014-10-16 DIAGNOSIS — G40909 Epilepsy, unspecified, not intractable, without status epilepticus: Secondary | ICD-10-CM | POA: Insufficient documentation

## 2014-10-16 DIAGNOSIS — T148 Other injury of unspecified body region: Secondary | ICD-10-CM | POA: Diagnosis not present

## 2014-10-16 DIAGNOSIS — M25559 Pain in unspecified hip: Secondary | ICD-10-CM | POA: Diagnosis not present

## 2014-10-16 DIAGNOSIS — R911 Solitary pulmonary nodule: Secondary | ICD-10-CM | POA: Diagnosis present

## 2014-10-16 DIAGNOSIS — Q851 Tuberous sclerosis: Secondary | ICD-10-CM | POA: Insufficient documentation

## 2014-10-16 DIAGNOSIS — Z79899 Other long term (current) drug therapy: Secondary | ICD-10-CM

## 2014-10-16 DIAGNOSIS — Z8589 Personal history of malignant neoplasm of other organs and systems: Secondary | ICD-10-CM | POA: Insufficient documentation

## 2014-10-16 DIAGNOSIS — X58XXXA Exposure to other specified factors, initial encounter: Secondary | ICD-10-CM | POA: Diagnosis not present

## 2014-10-16 DIAGNOSIS — K449 Diaphragmatic hernia without obstruction or gangrene: Secondary | ICD-10-CM | POA: Insufficient documentation

## 2014-10-16 DIAGNOSIS — Y929 Unspecified place or not applicable: Secondary | ICD-10-CM | POA: Diagnosis not present

## 2014-10-16 DIAGNOSIS — Y939 Activity, unspecified: Secondary | ICD-10-CM | POA: Diagnosis not present

## 2014-10-16 DIAGNOSIS — T84021D Dislocation of internal left hip prosthesis, subsequent encounter: Secondary | ICD-10-CM | POA: Diagnosis not present

## 2014-10-16 DIAGNOSIS — Z6837 Body mass index (BMI) 37.0-37.9, adult: Secondary | ICD-10-CM

## 2014-10-16 DIAGNOSIS — M25552 Pain in left hip: Secondary | ICD-10-CM | POA: Diagnosis not present

## 2014-10-16 HISTORY — PX: HIP CLOSED REDUCTION: SHX983

## 2014-10-16 SURGERY — CLOSED MANIPULATION, JOINT, HIP
Anesthesia: Monitor Anesthesia Care | Site: Hip | Laterality: Left

## 2014-10-16 MED ORDER — ONDANSETRON HCL 4 MG/2ML IJ SOLN
4.0000 mg | Freq: Once | INTRAMUSCULAR | Status: AC
  Administered 2014-10-16: 4 mg via INTRAVENOUS
  Filled 2014-10-16: qty 2

## 2014-10-16 MED ORDER — PROPOFOL 10 MG/ML IV BOLUS
INTRAVENOUS | Status: AC
  Filled 2014-10-16: qty 20

## 2014-10-16 MED ORDER — ONDANSETRON HCL 4 MG/2ML IJ SOLN: 4.0000 mg | Freq: Once | INTRAMUSCULAR | Status: DC | PRN

## 2014-10-16 MED ORDER — HYDROMORPHONE HCL 1 MG/ML IJ SOLN
1.0000 mg | Freq: Once | INTRAMUSCULAR | Status: AC
  Administered 2014-10-16: 1 mg via INTRAVENOUS
  Filled 2014-10-16: qty 1

## 2014-10-16 MED ORDER — HYDROMORPHONE HCL 1 MG/ML IJ SOLN
1.0000 mg | Freq: Once | INTRAMUSCULAR | Status: AC
Start: 2014-10-16 — End: 2014-10-16
  Administered 2014-10-16: 1 mg via INTRAVENOUS
  Filled 2014-10-16: qty 1

## 2014-10-16 MED ORDER — HYDROCODONE-ACETAMINOPHEN 5-325 MG PO TABS
1.0000 | ORAL_TABLET | Freq: Once | ORAL | Status: AC
  Administered 2014-10-16: 1 via ORAL

## 2014-10-16 MED ORDER — HYDROCODONE-ACETAMINOPHEN 5-325 MG PO TABS
ORAL_TABLET | ORAL | Status: AC
  Filled 2014-10-16: qty 1

## 2014-10-16 MED ORDER — PROPOFOL 10 MG/ML IV BOLUS
INTRAVENOUS | Status: DC | PRN
  Administered 2014-10-16: 20 mg via INTRAVENOUS
  Administered 2014-10-16 (×2): 40 mg via INTRAVENOUS

## 2014-10-16 MED ORDER — LACTATED RINGERS IV SOLN: INTRAVENOUS | Status: DC

## 2014-10-16 MED ORDER — FENTANYL CITRATE (PF) 100 MCG/2ML IJ SOLN: 25.0000 ug | INTRAMUSCULAR | Status: DC | PRN

## 2014-10-16 MED ORDER — LACTATED RINGERS IV SOLN
INTRAVENOUS | Status: DC | PRN
  Administered 2014-10-16: 14:00:00 via INTRAVENOUS

## 2014-10-16 SURGICAL SUPPLY — 13 items
BANDAGE ADH SHEER 1  50/CT (GAUZE/BANDAGES/DRESSINGS) IMPLANT
GAUZE SPONGE 4X4 12PLY STRL (GAUZE/BANDAGES/DRESSINGS) IMPLANT
GLOVE BIOGEL PI IND STRL 8 (GLOVE) ×1 IMPLANT
GLOVE BIOGEL PI INDICATOR 8 (GLOVE)
GLOVE ECLIPSE 8.0 STRL XLNG CF (GLOVE) ×1 IMPLANT
GOWN STRL REUS W/TWL XL LVL3 (GOWN DISPOSABLE) ×1 IMPLANT
IMMOBILIZER KNEE 20 (SOFTGOODS) ×2
IMMOBILIZER KNEE 20 THIGH 36 (SOFTGOODS) IMPLANT
NDL SAFETY ECLIPSE 18X1.5 (NEEDLE) IMPLANT
NEEDLE HYPO 18GX1.5 SHARP (NEEDLE)
POSITIONER SURGICAL ARM (MISCELLANEOUS) ×1 IMPLANT
SYR CONTROL 10ML LL (SYRINGE) IMPLANT
TOWEL OR 17X26 10 PK STRL BLUE (TOWEL DISPOSABLE) ×2 IMPLANT

## 2014-10-16 NOTE — Op Note (Signed)
NAMEMAGDALA, BRAHMBHATT NO.:  [id number]  MEDICAL RECORD NO.:  [id number]  LOCATION:  [facility]                         FACILITY:  [facility]  PHYSICIAN:  Lind Guest. 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  PROCEDURE:  Closed reduction under anesthesia, left anterior hip dislocation.  SURGEON:  Lind Guest. Ninfa Linden, MD  ANESTHESIA:  Mask ventilation IV sedation.  BLOOD LOSS:  Not applicable.  COMPLICATIONS:  None.  INDICATIONS:  Ms. Gavina is a 69 year old well known to me.  She is obese individual who is 4 weeks status post a left direct anterior hip replacement.  She was in shower today and she had her leg upon something trying to shave her legs and externally rotated and the hip dislocated anteriorly.  She was seen in the [facility] Emergency Room.  I found her leg to be in an odd position corresponding more with a dislocation anteriorly.  She was not shortened at all, but obviously had a more of an abducted and internally rotated position of her leg.  I recommended she undergo a closed reduction under anesthesia.  I scrutinized by intraoperative and postoperative films from a surgery and I feel like my anteversion was very adequate and only about 20 degrees of anteversion of that cup and her leg lengths showed that she was actually slightly longer as well, so certainly perplexed and having a dislocation but it certainly is what it is, so I do feel reduction is appropriate.  PROCEDURE DESCRIPTION:  After informed consent was obtained, appropriate left leg was marked.  She was brought to the operating room and we put her on the operating table, which is radiolucent, so I could  see this under fluoroscopy.  A time-out was called to identify correct patient and correct left leg within, so they just had to perform a little bit of mask ventilation and IV sedation.  I assessed the leg under fluoroscopy then with abduction and rotation, I was able to reduce the hip.  I then put it under fluoroscopy and it did show some shuck, but it was certainly stable and again her leg lengths showed that she was just slightly longer and was otherwise stable.  I did place a knee immobilizer on leg just precautionary and she was taken to recovery room in stable condition.  Postoperatively, I am going to have her adhered to strict anterior hip precautions and we will see her back in the office in a week.  She can come out and hand it out the knee immobilizer for hygiene purposes, but otherwise I want her to stay with weightbearing as tolerated.  She does have pain medicine and muscle relaxants at home.  Of note, I had of a 800+ anterior hips, this is only the second anterior dislocation that I have had which will certainly need me to scrutinize the x-rays even more so.     Lind Guest. Ninfa Linden, M.D.  CYB/MEDQ  D:  10/16/2014  T:  10/16/2014  Job:  [id number]

## 2014-10-16 NOTE — ED Provider Notes (Signed)
CSN: [id number]     Arrival date & time 10/16/14  1100 History   First MD Initiated Contact with Patient 10/16/14 1119     Chief Complaint  Patient presents with  . Hip Pain    LEFT  . Post-op Problem    LEFT HIP REPLACEMENT 4 WEEKS AGO     (Consider location/radiation/quality/duration/timing/severity/associated sxs/prior Treatment) HPI   69 year old female who had a total left hip surgery a month ago right ear via EMS for evaluation of left hip pain. Prior to arrival patient was shaving her leg, bent over and heard and felt a pop followed by acute onset of sharp pain to left hip. Pain is worsened with movement. EMS was called and patient was transported here for further evaluation. She did receive 250mg  of Fentanyl en route.  Continues to endorse significant pain to her hip. She denies any numbness. Denies any back pain or abdominal pain. Pain does radiates down the leg.  Her total L hip arthroplasty was performed by Dr. Ninfa Linden due to severe osteoarthritis.  No prior hip dislocation.    Past Medical History  Diagnosis Date  . Arthritis   . Diverticulosis   . GERD (gastroesophageal reflux disease)   . Osteoporosis   . Tuberous sclerosis     EXTERNAL LESIONS--  MAINLY HANDS (INTERNAL SCLEROTIC BONY LESIONS LUMBAR & PELVIS PER CT 11/2013)  . Seasonal allergies   . H/O hiatal hernia   . History of diverticulitis of colon   . Nodule of right lung     BENIGN--- AND STABLE PER  CT  11/2013  . History of gastric ulcer   . History of epilepsy     childhood age 79 to 89  ---secondary to high calcium deposts of optic nerve---  none since age 41 with pregency  . IBS (irritable bowel syndrome)   . History of kidney stones   . History of shingles     MARCH 2014--  BACK AREA  . History of vulvar dysplasia     S/P  LASER ABLATION 2014  . Right ureteral stone   . Ureteral stenosis, right   . DDD (degenerative disc disease), lumbosacral   . Renal cyst, left     STABLE PER CT 11/2013  .  PONV (postoperative nausea and vomiting)     occurred back in 1970's   . Anginal pain     hx of pt relates to stress last occurrence 8 to 10 years ago   . Tachycardia   . Mild obstructive sleep apnea     SLEEP STUDY  11-19-2012--  NO CPAP RX (DID NOT MEET CRITIRIA)  . Shortness of breath dyspnea     walking distances and climbing stairs  . Bronchitis     hx of   . Phlebitis     hx of    Past Surgical History  Procedure Laterality Date  . Bladder surgery  1991    bladder reconstruction of torsion  . Colonoscopy    . Upper gastrointestinal endoscopy    . Cataract extraction w/ intraocular lens  implant, bilateral    . Cholecystectomy  11-18-2003  . Cardiac catheterization  06-26-2001    NORMAL CORONARY ARTERIES  . Orif right bimalleolar ankle fx  12-18-2003  . 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 Lumbar disc surgery  1985  . Vaginal hysterectomy  1975  . Hemorrhoid surgery  1970's  . Co2 laser application N/A  09/01/2012    Procedure: CO2 LASER APPLICATION;  Surgeon: Terrance Mass, MD;  Location: [facility];  Service: Gynecology;  Laterality: N/A;CO2 laser of vaginal and vulvar lesions  . Gynecologic cryosurgery    . Dilation and curettage of uterus  multiple -- last one 1975  . Augmentation mammaplasty      SALINE  . Cystoscopy with retrograde pyelogram, ureteroscopy and stent placement Right 11/22/2013    Procedure: CYSTOSCOPY WITH RETROGRADE PYELOGRAM, URETEROSCOPY AND STENT PLACEMENT;  Surgeon: Sharyn Creamer, MD;  Location: [facility];  Service: Urology;  Laterality: Right;  . Cystoscopy/retrograde/ureteroscopy Right 12/01/2013    Procedure: CYSTOSCOPY, RIGHT RETROGRADE/RIGHT DIGITAL URETEROSCOPY, RIGHT URETERAL STENT EXCHANGE;  Surgeon: Sharyn Creamer, MD;  Location: [facility];  Service: Urology;  Laterality: Right;  STAGED RIGHT URETEROSCOPY RIGHT URETER DILATION     . Holmium laser application Right 6/0/1093    Procedure: HOLMIUM LASER APPLICATION;  Surgeon: Sharyn Creamer, MD;  Location: [facility];  Service: Urology;  Laterality: Right;  . Total hip arthroplasty Left 09/16/2014    Procedure: LEFT TOTAL HIP ARTHROPLASTY ANTERIOR APPROACH;  Surgeon: Mcarthur Rossetti, MD;  Location: [facility];  Service: Orthopedics;  Laterality: Left;   Family History  Problem Relation Age of Onset  . Heart disease Mother   . Tuberous sclerosis Mother   . Tuberous sclerosis Sister   . Tuberous sclerosis Daughter   . Tuberous sclerosis Son   . Tuberous sclerosis Maternal Aunt   . Tuberous sclerosis Maternal Grandmother   . Tuberous sclerosis Maternal Aunt   . Tuberous sclerosis Maternal Aunt   . Tuberous sclerosis Cousin    History  Substance Use Topics  . Smoking status: Former Smoker -- 2.00 packs/day for 20 years    Types: Cigarettes    Quit date: 06/03/1998  . Smokeless tobacco: Never Used  . Alcohol Use: No   OB History    Gravida Para Term Preterm AB TAB SAB Ectopic Multiple Living   2 2 0 0 0 0 0 0 0 2      Review of Systems  Constitutional: Negative for fever.  Musculoskeletal: Positive for arthralgias.  Neurological: Negative for numbness.      Allergies  Percocet; Zoloft; and Contrast media  Home Medications   Prior to Admission medications   Medication Sig Start Date End Date Taking? Authorizing Provider  cholecalciferol (VITAMIN D) 1000 UNITS tablet Take 4,000 Units by mouth daily.     Historical Provider, MD  cyclobenzaprine (FLEXERIL) 10 MG tablet Take 10 mg by mouth 3 (three) times daily as needed for muscle spasms.    Historical Provider, MD  furosemide (LASIX) 20 MG tablet TAKE 1 TABLET BY MOUTH DAILY 07/11/14   Rosalita Chessman, DO  HYDROcodone-acetaminophen (NORCO/VICODIN) 5-325 MG per tablet Take 1 tablet by mouth at bedtime as needed for moderate pain. 09/19/14   Pete Pelt, PA-C  meloxicam (MOBIC) 15 MG  tablet Take 15 mg by mouth daily.    Historical Provider, MD  omeprazole (PRILOSEC) 40 MG capsule Take 40 mg by mouth daily.    Historical Provider, MD  valACYclovir (VALTREX) 1000 MG tablet Take 1 tablet (1,000 mg total) by mouth 3 (three) times daily. Patient taking differently: Take 1,000 mg by mouth 3 (three) times daily as needed (for outbreaks).  09/21/13   Terrance Mass, MD  Vitamin D, Ergocalciferol, (DRISDOL) 50000 UNITS CAPS capsule Take 50,000 Units by mouth 2 (two) times a  week.    Historical Provider, MD  warfarin (COUMADIN) 5 MG tablet Take by mouth daily.    Historical Provider, MD   BP 165/96 mmHg  Pulse 114  Temp(Src) 97.6 F (36.4 C) (Oral)  Resp 18  SpO2 100% Physical Exam  Constitutional: She appears well-developed and well-nourished. No distress.  Obese Caucasian female, appears uncomfortable, crying, complaining of left hip pain.  HENT:  Head: Atraumatic.  Eyes: Conjunctivae are normal.  Neck: Neck supple.  Cardiovascular: Intact distal pulses.   Tachycardia without murmurs rubs or gallops  Abdominal: Soft. There is no tenderness.  Musculoskeletal: She exhibits tenderness (Left lower extremity is internally rotated and lengthen. Exquisitely tenderness noted throughout left hip with decreased range of motion).  Neurological: She is alert.  Skin: No rash noted.  Psychiatric: She has a normal mood and affect.  Nursing note and vitals reviewed.   ED Course  Procedures (including critical care time)  12:59 PM Patient with recent total left hip arthroplasty done by Dr. Ninfa Linden who is here for new onset of left hip pain after she leaned forward. It appears that she has a left hip dislocation. She is neurovascularly intact. Pain medication given.  X-ray illustrated a posterior medial dislocation of the femoral head component of the left total hip arthroplasty. Pt made aware of finding.  I will consult Dr. Ninfa Linden for recommendation.    1:34 PM Dr. Ninfa Linden  plan to take pt to OR for L hip closed reduction under general anesthesia    Labs Review Labs Reviewed - No data to display  Imaging Review Dg Hip Unilat With Pelvis 2-3 Views Left  10/16/2014   CLINICAL DATA:  Left hip pain today. Left hip surgery 4 weeks ago. Initial encounter.  EXAM: LEFT HIP (WITH PELVIS) 2-3 VIEWS  COMPARISON:  09/16/2014  FINDINGS: Left total hip arthroplasty identified. There has been interval posterior medial dislocation of the femoral head component.  No acute fracture is identified.  Moderate -severe degenerative changes in the right hip noted.  IMPRESSION: Posterior medial dislocation of femoral head component of left total hip arthroplasty.   Electronically Signed   By: Margarette Canada M.D.   On: 10/16/2014 12:37     EKG Interpretation None      MDM   Final diagnoses:  Hip dislocation, left    BP 139/87 mmHg  Pulse 119  Temp(Src) 97.8 F (36.6 C) (Oral)  Resp 20  SpO2 100%  I have reviewed nursing notes and vital signs. I personally reviewed the imaging tests through PACS system  I reviewed available ER/hospitalization records thought the EMR     Domenic Moras, PA-C 10/16/14 [place], MD 10/17/14 [id number]

## 2014-10-16 NOTE — ED Notes (Signed)
CONSENT SIGNED BY PT

## 2014-10-16 NOTE — Anesthesia Preprocedure Evaluation (Addendum)
Anesthesia Evaluation  Patient identified by MRN, date of birth, ID band Patient awake  General Assessment Comment:Arthritis   . Diverticulosis  . GERD (gastroesophageal reflux disease)  . Osteoporosis  . Tuberous sclerosis    EXTERNAL LESIONS-- MAINLY HANDS (INTERNAL SCLEROTIC BONY LESIONS LUMBAR & PELVIS PER CT 11/2013) . Seasonal allergies  . H/O hiatal hernia  . History of diverticulitis of colon  . Nodule of right lung    BENIGN--- AND STABLE PER CT 11/2013 . History of gastric ulcer  . History of epilepsy    childhood age 89 to 62 ---secondary to high calcium deposts of optic nerve--- none since age 77 with pregency . IBS (irritable bowel syndrome)  . History of kidney stones  . History of shingles    MARCH 2014-- BACK AREA . History of vulvar dysplasia    S/P LASER ABLATION 2014 . Right ureteral stone  . Ureteral stenosis, right  . DDD (degenerative disc disease), lumbosacral  . Renal cyst, left    STABLE PER CT 11/2013 . PONV (postoperative nausea and vomiting)    occurred back in 1970's  . Anginal pain    hx of pt relates to stress last occurrence 8 to 10 years ago  . Tachycardia  . Mild obstructive sleep apnea    SLEEP STUDY 11-19-2012-- NO CPAP RX (DID NOT MEET CRITIRIA) . Shortness of breath dyspnea    walking distances and climbing stairs . Bronchitis    hx of  . Phlebitis    hx of        Reviewed: Allergy & Precautions, NPO status , Patient's Chart, lab work & pertinent test results  History of Anesthesia Complications (+) PONV and history of anesthetic complications  Airway Mallampati: III  TM Distance: >3 FB Neck ROM: Full    Dental no notable dental hx. (+) Dental Advisory Given, Chipped, Poor Dentition,    Pulmonary sleep apnea , former smoker,  breath sounds clear to  auscultation  Pulmonary exam normal       Cardiovascular + angina Normal cardiovascular examRhythm:Regular Rate:Normal  Recently seen by cardiology for irregular heart beat and chest pain. Stress testing and holter monitor ordered. No changes to medications.    Neuro/Psych Seizures -,  Tuberous sclerosis    EXTERNAL LESIONS-- MAINLY HANDS (INTERNAL SCLEROTIC BONY LESIONS LUMBAR & PELVIS PER CT 11/2013)   History of epilepsy    childhood age 55 to 67 ---secondary to high calcium deposts of optic nerve--- none since age 18 with pregency       negative psych ROS   GI/Hepatic negative GI ROS, Neg liver ROS, GERD-  Medicated and Controlled,  Endo/Other  negative endocrine ROS  Renal/GU negative Renal ROS  negative genitourinary   Musculoskeletal  (+) Arthritis -, Osteoarthritis,    Abdominal   Peds negative pediatric ROS (+)  Hematology negative hematology ROS (+)   Anesthesia Other Findings   Reproductive/Obstetrics negative OB ROS                           Anesthesia Physical Anesthesia Plan  ASA: III  Anesthesia Plan: MAC   Post-op Pain Management:    Induction: Intravenous  Airway Management Planned:   Additional Equipment:   Intra-op Plan:   Post-operative Plan:   Informed Consent: I have reviewed the patients History and Physical, chart, labs and discussed the procedure including the risks, benefits and alternatives for the proposed anesthesia with the patient or authorized representative who  has indicated his/her understanding and acceptance.   Dental advisory given  Plan Discussed with: CRNA  Anesthesia Plan Comments: (NPO since 7am to clear liquids and since 7pm 5/14 to solids, denies any current reflux symptoms)      Anesthesia Quick Evaluation

## 2014-10-16 NOTE — ED Notes (Signed)
Bed: WA12 Expected date:  Expected time:  Means of arrival:  Comments: EMS-hip pain 

## 2014-10-16 NOTE — H&P (Signed)
[name] is an 69 y.o. female.   Chief Complaint:   Left hip pain; known dislocation HPI:   69 yo female about 4 weeks post a left anterior hip replacement.  Somehow bent different today and felt a pop and obvious pain.  With the inability to ambulate, was brought to [facility] ED and found to have a left hip dislocation.  Past Medical History  Diagnosis Date  . Arthritis   . Diverticulosis   . GERD (gastroesophageal reflux disease)   . Osteoporosis   . Tuberous sclerosis     EXTERNAL LESIONS--  MAINLY HANDS (INTERNAL SCLEROTIC BONY LESIONS LUMBAR & PELVIS PER CT 11/2013)  . Seasonal allergies   . H/O hiatal hernia   . History of diverticulitis of colon   . Nodule of right lung     BENIGN--- AND STABLE PER  CT  11/2013  . History of gastric ulcer   . History of epilepsy     childhood age 62 to 73  ---secondary to high calcium deposts of optic nerve---  none since age 20 with pregency  . IBS (irritable bowel syndrome)   . History of kidney stones   . History of shingles     MARCH 2014--  BACK AREA  . History of vulvar dysplasia     S/P  LASER ABLATION 2014  . Right ureteral stone   . Ureteral stenosis, right   . DDD (degenerative disc disease), lumbosacral   . Renal cyst, left     STABLE PER CT 11/2013  . PONV (postoperative nausea and vomiting)     occurred back in 1970's   . Anginal pain     hx of pt relates to stress last occurrence 8 to 10 years ago   . Tachycardia   . Mild obstructive sleep apnea     SLEEP STUDY  11-19-2012--  NO CPAP RX (DID NOT MEET CRITIRIA)  . Shortness of breath dyspnea     walking distances and climbing stairs  . Bronchitis     hx of   . Phlebitis     hx of     Past Surgical History  Procedure Laterality Date  . Bladder surgery  1991    bladder reconstruction of torsion  . Colonoscopy    . Upper gastrointestinal endoscopy    . Cataract extraction w/ intraocular lens  implant, bilateral    . Cholecystectomy  11-18-2003  . Cardiac  catheterization  06-26-2001    NORMAL CORONARY ARTERIES  . Orif right bimalleolar ankle fx  12-18-2003  . 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 Lumbar disc surgery  1985  . Vaginal hysterectomy  1975  . Hemorrhoid surgery  1970's  . Co2 laser application N/A 12/07/8240    Procedure: CO2 LASER APPLICATION;  Surgeon: Terrance Mass, MD;  Location: [facility];  Service: Gynecology;  Laterality: N/A;CO2 laser of vaginal and vulvar lesions  . Gynecologic cryosurgery    . Dilation and curettage of uterus  multiple -- last one 1975  . Augmentation mammaplasty      SALINE  . Cystoscopy with retrograde pyelogram, ureteroscopy and stent placement Right 11/22/2013    Procedure: CYSTOSCOPY WITH RETROGRADE PYELOGRAM, URETEROSCOPY AND STENT PLACEMENT;  Surgeon: Sharyn Creamer, MD;  Location[facility];  Service: Urology;  Laterality: Right;  . Cystoscopy/retrograde/ureteroscopy Right 12/01/2013    Procedure: CYSTOSCOPY, RIGHT RETROGRADE/RIGHT DIGITAL URETEROSCOPY, RIGHT URETERAL  STENT EXCHANGE;  Surgeon: Sharyn Creamer, MD;  Location: [facility];  Service: Urology;  Laterality: Right;  STAGED RIGHT URETEROSCOPY RIGHT URETER DILATION    . Holmium laser application Right 12/07/9388    Procedure: HOLMIUM LASER APPLICATION;  Surgeon: Sharyn Creamer, MD;  Location: [facility];  Service: Urology;  Laterality: Right;  . Total hip arthroplasty Left 09/16/2014    Procedure: LEFT TOTAL HIP ARTHROPLASTY ANTERIOR APPROACH;  Surgeon: Mcarthur Rossetti, MD;  Location: [facility];  Service: Orthopedics;  Laterality: Left;    Family History  Problem Relation Age of Onset  . Heart disease Mother   . Tuberous sclerosis Mother   . Tuberous sclerosis Sister   . Tuberous sclerosis Daughter   . Tuberous sclerosis Son   . Tuberous sclerosis Maternal Aunt   . Tuberous sclerosis Maternal Grandmother    . Tuberous sclerosis Maternal Aunt   . Tuberous sclerosis Maternal Aunt   . Tuberous sclerosis Cousin    Social History:  reports that she quit smoking about 16 years ago. Her smoking use included Cigarettes. She has a 40 pack-year smoking history. She has never used smokeless tobacco. She reports that she does not drink alcohol or use illicit drugs.  Allergies:  Allergies  Allergen Reactions  . Percocet [Oxycodone-Acetaminophen] Nausea And Vomiting  . Zoloft [Sertraline Hcl]     Pt can not take SSRI  . Contrast Media [Iodinated Diagnostic Agents] Rash    States was oral contrast- not betadine based IV contrast     (Not in a hospital admission)  No results found for this or any previous visit (from the past 48 hour(s)). Dg Hip Unilat With Pelvis 2-3 Views Left  10/16/2014   CLINICAL DATA:  Left hip pain today. Left hip surgery 4 weeks ago. Initial encounter.  EXAM: LEFT HIP (WITH PELVIS) 2-3 VIEWS  COMPARISON:  09/16/2014  FINDINGS: Left total hip arthroplasty identified. There has been interval posterior medial dislocation of the femoral head component.  No acute fracture is identified.  Moderate -severe degenerative changes in the right hip noted.  IMPRESSION: Posterior medial dislocation of femoral head component of left total hip arthroplasty.   Electronically Signed   By: Margarette Canada M.D.   On: 10/16/2014 12:37    Review of Systems  All other systems reviewed and are negative.   Blood pressure 155/87, pulse 111, temperature 97.8 F (36.6 C), temperature source Oral, resp. rate 20, SpO2 100 %. Physical Exam  Constitutional: She is oriented to person, place, and time. She appears well-developed and well-nourished.  HENT:  Head: Normocephalic and atraumatic.  Eyes: Conjunctivae are normal. Pupils are equal, round, and reactive to light.  Neck: Normal range of motion. Neck supple.  Cardiovascular: Normal rate and regular rhythm.   Respiratory: Effort normal and breath sounds  normal.  GI: Soft. Bowel sounds are normal.  Musculoskeletal:       Left hip: She exhibits tenderness and deformity.  Neurological: She is alert and oriented to person, place, and time.  Skin: Skin is warm and dry.  Psychiatric: She has a normal mood and affect.     Assessment/Plan Left prosthetic hip joint dislocation 1)  To the OR today for a closed reduction under anesthia and assessment under fluoro  Shawnta Schlegel Y 10/16/2014, 1:50 PM

## 2014-10-16 NOTE — ED Notes (Signed)
Per [facility]- Pt had hip surgery left side 4 weeks ago- Shaving leg and bent over "heard and felt pop". Obvious externally rotated. Unable to manipulate due to pain. Denies any other complaints. Pt wears heart monitoring however currently not on for heart arrhythmia. Taken off during shower and  not placed back on in transport. Fentanyl 250 mcg IVP given total in route 10/10 after.

## 2014-10-16 NOTE — Discharge Instructions (Signed)
Wear the knee immobilizer when in bed and when walking as well as sitting only coming out of it to occasionally get air. You can remove the knee immobilizer to shower, but no bending over or shaving your legs. You can put full weight on your leg

## 2014-10-16 NOTE — ED Notes (Signed)
EDPA BOWIE at bedside.

## 2014-10-16 NOTE — Transfer of Care (Signed)
Immediate Anesthesia Transfer of Care Note  Patient: [name]  Procedure(s) Performed: Procedure(s): CLOSED MANIPULATION HIP (Left)  Patient Location: PACU  Anesthesia Type:MAC  Level of Consciousness: awake, alert  and oriented  Airway & Oxygen Therapy: Patient Spontanous Breathing and Patient connected to face mask oxygen  Post-op Assessment: Report given to RN and Post -op Vital signs reviewed and stable  Post vital signs: Reviewed and stable  Last Vitals:  Filed Vitals:   10/16/14 1352  BP: 139/87  Pulse: 119  Temp:   Resp: 20    Complications: No apparent anesthesia complications

## 2014-10-16 NOTE — ED Notes (Signed)
Patient transported to X-ray 

## 2014-10-16 NOTE — Brief Op Note (Signed)
10/16/2014  2:45 PM  PATIENT:  [name]  69 y.o. female  PRE-OPERATIVE DIAGNOSIS:  dislocated left hip  POST-OPERATIVE DIAGNOSIS:  dislocated left hip  PROCEDURE:  Procedure(s): CLOSED MANIPULATION HIP (Left)  SURGEON:  Surgeon(s) and Role:    * Mcarthur Rossetti, MD - Primary  ASSISTANTS: OR staff   ANESTHESIA:   IV sedation  DICTATION: .Other Dictation: Dictation Number [id number]  PLAN OF CARE: Discharge to home after PACU  PATIENT DISPOSITION:  PACU - hemodynamically stable.   Delay start of Pharmacological VTE agent (>24hrs) due to surgical blood loss or risk of bleeding: not applicable

## 2014-10-16 NOTE — Addendum Note (Signed)
Addendum  created 10/16/14 1500 by Lauretta Grill, MD   Modules edited: Orders

## 2014-10-16 NOTE — Anesthesia Postprocedure Evaluation (Signed)
  Anesthesia Post-op Note  Patient: [name]  Procedure(s) Performed: Procedure(s) (LRB): CLOSED MANIPULATION HIP (Left)  Patient Location: PACU  Anesthesia Type: MAC  Level of Consciousness: awake and alert   Airway and Oxygen Therapy: Patient Spontanous Breathing  Post-op Pain: mild  Post-op Assessment: Post-op Vital signs reviewed, Patient's Cardiovascular Status Stable, Respiratory Function Stable, Patent Airway and No signs of Nausea or vomiting  Last Vitals:  Filed Vitals:   10/16/14 1352  BP: 139/87  Pulse: 119  Temp:   Resp: 20    Post-op Vital Signs: stable   Complications: No apparent anesthesia complications

## 2014-10-17 ENCOUNTER — Inpatient Hospital Stay (HOSPITAL_COMMUNITY): Payer: Medicare Other

## 2014-10-17 ENCOUNTER — Encounter (HOSPITAL_COMMUNITY): Payer: Self-pay | Admitting: *Deleted

## 2014-10-17 ENCOUNTER — Inpatient Hospital Stay (HOSPITAL_COMMUNITY)
Admission: RE | Admit: 2014-10-17 | Discharge: 2014-10-22 | DRG: 467 | Disposition: A | Discharge status: Home Health | Payer: Medicare Other | Source: Other Acute Inpatient Hospital | Attending: Orthopaedic Surgery | Admitting: Orthopaedic Surgery

## 2014-10-17 DIAGNOSIS — Y939 Activity, unspecified: Secondary | ICD-10-CM | POA: Diagnosis not present

## 2014-10-17 DIAGNOSIS — Z7901 Long term (current) use of anticoagulants: Secondary | ICD-10-CM | POA: Diagnosis not present

## 2014-10-17 DIAGNOSIS — S73005A Unspecified dislocation of left hip, initial encounter: Secondary | ICD-10-CM | POA: Diagnosis not present

## 2014-10-17 DIAGNOSIS — Z96642 Presence of left artificial hip joint: Secondary | ICD-10-CM

## 2014-10-17 DIAGNOSIS — K219 Gastro-esophageal reflux disease without esophagitis: Secondary | ICD-10-CM | POA: Diagnosis present

## 2014-10-17 DIAGNOSIS — Y792 Prosthetic and other implants, materials and accessory orthopedic devices associated with adverse incidents: Secondary | ICD-10-CM | POA: Diagnosis present

## 2014-10-17 DIAGNOSIS — Z79891 Long term (current) use of opiate analgesic: Secondary | ICD-10-CM | POA: Diagnosis not present

## 2014-10-17 DIAGNOSIS — T84021D Dislocation of internal left hip prosthesis, subsequent encounter: Secondary | ICD-10-CM | POA: Diagnosis not present

## 2014-10-17 DIAGNOSIS — Y929 Unspecified place or not applicable: Secondary | ICD-10-CM | POA: Diagnosis not present

## 2014-10-17 DIAGNOSIS — M24452 Recurrent dislocation, left hip: Secondary | ICD-10-CM | POA: Diagnosis not present

## 2014-10-17 DIAGNOSIS — S32592A Other specified fracture of left pubis, initial encounter for closed fracture: Secondary | ICD-10-CM | POA: Diagnosis not present

## 2014-10-17 DIAGNOSIS — Z885 Allergy status to narcotic agent status: Secondary | ICD-10-CM | POA: Diagnosis not present

## 2014-10-17 DIAGNOSIS — Z471 Aftercare following joint replacement surgery: Secondary | ICD-10-CM | POA: Diagnosis not present

## 2014-10-17 DIAGNOSIS — Y999 Unspecified external cause status: Secondary | ICD-10-CM | POA: Diagnosis not present

## 2014-10-17 DIAGNOSIS — D62 Acute posthemorrhagic anemia: Secondary | ICD-10-CM | POA: Diagnosis not present

## 2014-10-17 DIAGNOSIS — S73005D Unspecified dislocation of left hip, subsequent encounter: Secondary | ICD-10-CM | POA: Diagnosis not present

## 2014-10-17 DIAGNOSIS — Z87891 Personal history of nicotine dependence: Secondary | ICD-10-CM | POA: Diagnosis not present

## 2014-10-17 DIAGNOSIS — Z79899 Other long term (current) drug therapy: Secondary | ICD-10-CM | POA: Diagnosis not present

## 2014-10-17 DIAGNOSIS — Z91041 Radiographic dye allergy status: Secondary | ICD-10-CM | POA: Diagnosis not present

## 2014-10-17 DIAGNOSIS — T84029A Dislocation of unspecified internal joint prosthesis, initial encounter: Secondary | ICD-10-CM

## 2014-10-17 DIAGNOSIS — S32602A Unspecified fracture of left ischium, initial encounter for closed fracture: Secondary | ICD-10-CM | POA: Diagnosis not present

## 2014-10-17 DIAGNOSIS — X58XXXA Exposure to other specified factors, initial encounter: Secondary | ICD-10-CM | POA: Diagnosis not present

## 2014-10-17 DIAGNOSIS — R911 Solitary pulmonary nodule: Secondary | ICD-10-CM | POA: Diagnosis present

## 2014-10-17 DIAGNOSIS — M24459 Recurrent dislocation, unspecified hip: Secondary | ICD-10-CM | POA: Diagnosis present

## 2014-10-17 DIAGNOSIS — Q851 Tuberous sclerosis: Secondary | ICD-10-CM | POA: Diagnosis not present

## 2014-10-17 DIAGNOSIS — J302 Other seasonal allergic rhinitis: Secondary | ICD-10-CM | POA: Diagnosis present

## 2014-10-17 DIAGNOSIS — Z419 Encounter for procedure for purposes other than remedying health state, unspecified: Secondary | ICD-10-CM

## 2014-10-17 DIAGNOSIS — M1990 Unspecified osteoarthritis, unspecified site: Secondary | ICD-10-CM | POA: Diagnosis not present

## 2014-10-17 DIAGNOSIS — Z96649 Presence of unspecified artificial hip joint: Secondary | ICD-10-CM

## 2014-10-17 DIAGNOSIS — T84021A Dislocation of internal left hip prosthesis, initial encounter: Secondary | ICD-10-CM | POA: Diagnosis not present

## 2014-10-17 DIAGNOSIS — M25562 Pain in left knee: Secondary | ICD-10-CM | POA: Diagnosis not present

## 2014-10-17 DIAGNOSIS — Z6837 Body mass index (BMI) 37.0-37.9, adult: Secondary | ICD-10-CM | POA: Diagnosis not present

## 2014-10-17 DIAGNOSIS — Z888 Allergy status to other drugs, medicaments and biological substances status: Secondary | ICD-10-CM | POA: Diagnosis not present

## 2014-10-17 DIAGNOSIS — M810 Age-related osteoporosis without current pathological fracture: Secondary | ICD-10-CM | POA: Diagnosis present

## 2014-10-17 LAB — BASIC METABOLIC PANEL
Anion gap: 9 (ref 5–15)
BUN: 7 mg/dL (ref 6–20)
CHLORIDE: 101 mmol/L (ref 101–111)
CO2: 26 mmol/L (ref 22–32)
Calcium: 8.6 mg/dL — ABNORMAL LOW (ref 8.9–10.3)
Creatinine, Ser: 0.69 mg/dL (ref 0.44–1.00)
GFR calc Af Amer: >60 mL/min (ref >60)
GFR calc non Af Amer: >60 mL/min (ref >60)
Glucose, Bld: 138 mg/dL — ABNORMAL HIGH (ref 65–99)
POTASSIUM: 3.2 mmol/L — AB (ref 3.5–5.1)
Sodium: 136 mmol/L (ref 135–145)

## 2014-10-17 LAB — PROTIME-INR
INR: 1.85 — ABNORMAL HIGH (ref 0.00–1.49)
PROTHROMBIN TIME: 21.5 s — AB (ref 11.6–15.2)

## 2014-10-17 LAB — CBC
HCT: 34 % — ABNORMAL LOW (ref 36.0–46.0)
HEMOGLOBIN: 11.2 g/dL — AB (ref 12.0–15.0)
MCH: 29 pg (ref 26.0–34.0)
MCHC: 32.9 g/dL (ref 30.0–36.0)
MCV: 88.1 fL (ref 78.0–100.0)
Platelets: 230 10*3/uL (ref 150–400)
RBC: 3.86 MIL/uL — ABNORMAL LOW (ref 3.87–5.11)
RDW: 13.8 % (ref 11.5–15.5)
WBC: 5.6 10*3/uL (ref 4.0–10.5)

## 2014-10-17 MED ORDER — FUROSEMIDE 20 MG PO TABS
20.0000 mg | ORAL_TABLET | Freq: Every day | ORAL | Status: DC
  Administered 2014-10-17 – 2014-10-22 (×6): 20 mg via ORAL
  Filled 2014-10-17 (×6): qty 1

## 2014-10-17 MED ORDER — SODIUM CHLORIDE 0.9 % IV SOLN: INTRAVENOUS | Status: DC

## 2014-10-17 MED ORDER — ENOXAPARIN SODIUM 40 MG/0.4ML ~~LOC~~ SOLN
40.0000 mg | SUBCUTANEOUS | Status: AC
  Administered 2014-10-17: 40 mg via SUBCUTANEOUS
  Filled 2014-10-17: qty 0.4

## 2014-10-17 MED ORDER — POTASSIUM CHLORIDE CRYS ER 20 MEQ PO TBCR
40.0000 meq | EXTENDED_RELEASE_TABLET | Freq: Once | ORAL | Status: AC
  Administered 2014-10-17: 40 meq via ORAL
  Filled 2014-10-17: qty 2

## 2014-10-17 MED ORDER — ONDANSETRON HCL 4 MG PO TABS: 4.0000 mg | ORAL_TABLET | Freq: Four times a day (QID) | ORAL | Status: DC | PRN

## 2014-10-17 MED ORDER — HYDROCODONE-ACETAMINOPHEN 5-325 MG PO TABS
1.0000 | ORAL_TABLET | ORAL | Status: DC | PRN
  Administered 2014-10-17 – 2014-10-18 (×3): 2 via ORAL
  Administered 2014-10-18: 1 via ORAL
  Administered 2014-10-19 – 2014-10-22 (×2): 2 via ORAL
  Filled 2014-10-17: qty 2
  Filled 2014-10-17: qty 1
  Filled 2014-10-17 (×4): qty 2

## 2014-10-17 MED ORDER — VITAMIN D 1000 UNITS PO TABS
4000.0000 [IU] | ORAL_TABLET | Freq: Every day | ORAL | Status: DC
  Administered 2014-10-17 – 2014-10-22 (×6): 4000 [IU] via ORAL
  Filled 2014-10-17 (×8): qty 4

## 2014-10-17 MED ORDER — PANTOPRAZOLE SODIUM 40 MG PO TBEC
80.0000 mg | DELAYED_RELEASE_TABLET | Freq: Every day | ORAL | Status: DC
  Administered 2014-10-17 – 2014-10-22 (×6): 80 mg via ORAL
  Filled 2014-10-17 (×6): qty 2

## 2014-10-17 MED ORDER — PHYTONADIONE 5 MG PO TABS
5.0000 mg | ORAL_TABLET | Freq: Once | ORAL | Status: AC
  Administered 2014-10-17: 5 mg via ORAL
  Filled 2014-10-17: qty 1

## 2014-10-17 MED ORDER — METHOCARBAMOL 500 MG PO TABS
500.0000 mg | ORAL_TABLET | Freq: Four times a day (QID) | ORAL | Status: DC | PRN
Start: 2014-10-17 — End: 2014-10-20
  Administered 2014-10-17 – 2014-10-19 (×7): 500 mg via ORAL
  Filled 2014-10-17 (×8): qty 1

## 2014-10-17 NOTE — Progress Notes (Signed)
Patient ID: [name], female   DOB: 05-22-1946, 69 y.o.   MRN: [id number] I have spoken to [name] in length and explained the situation.  I believe she needs a revision surgery tomorrow with changing at minimum her left hip ball to a longer size which can increase her leg length as well as offset and tighten up her hip.  She understands this and does wish to proceed given her dislocation.

## 2014-10-17 NOTE — H&P (Signed)
[name] is an 69 y.o. female.   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  Had been doing well and I had seen her in follow-up with no issues.  Yesterday am she was in the shower and bending awkwardly to shave her legs when she slipped and sustained a left prosthetic hip anterior dislocation.  I saw her at [facility] and was able to reduce her hip under sedation, but in the OR so I could assess her hip under direct fluoro.  She was then discharge to home in a knee immobilizer and anterior hip precautions.  Last evening she dislocated again and was reduced.  I admitted her given this 2nd occurrence so I can further assess her hip anatomy with a CT scan and possibly plan for a revision.  Past Medical History  Diagnosis Date  . Arthritis   . Diverticulosis   . GERD (gastroesophageal reflux disease)   . Osteoporosis   . Tuberous sclerosis     EXTERNAL LESIONS--  MAINLY HANDS (INTERNAL SCLEROTIC BONY LESIONS LUMBAR & PELVIS PER CT 11/2013)  . Seasonal allergies   . H/O hiatal hernia   . History of diverticulitis of colon   . Nodule of right lung     BENIGN--- AND STABLE PER  CT  11/2013  . History of gastric ulcer   . History of epilepsy     childhood age 59 to 40  ---secondary to high calcium deposts of optic nerve---  none since age 69 with pregency  . IBS (irritable bowel syndrome)   . History of kidney stones   . History of shingles     MARCH 2014--  BACK AREA  . History of vulvar dysplasia     S/P  LASER ABLATION 2014  . Right ureteral stone   . Ureteral stenosis, right   . DDD (degenerative disc disease), lumbosacral   . Renal cyst, left     STABLE PER CT 11/2013  . PONV (postoperative nausea and vomiting)     occurred back in 1970's   . Anginal pain     hx of pt relates to stress last occurrence 8 to 10 years ago   . Tachycardia   . Mild  obstructive sleep apnea     SLEEP STUDY  11-19-2012--  NO CPAP RX (DID NOT MEET CRITIRIA)  . Shortness of breath dyspnea     walking distances and climbing stairs  . Bronchitis     hx of   . Phlebitis     hx of     Past Surgical History  Procedure Laterality Date  . Bladder surgery  1991    bladder reconstruction of torsion  . Colonoscopy    . Upper gastrointestinal endoscopy    . Cataract extraction w/ intraocular lens  implant, bilateral    . Cholecystectomy  11-18-2003  . Cardiac catheterization  06-26-2001    NORMAL CORONARY ARTERIES  . Orif right bimalleolar ankle fx  12-18-2003  . 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 Lumbar disc surgery  1985  . Vaginal hysterectomy  1975  . Hemorrhoid surgery  1970's  . Co2 laser application N/A 02/07/2951    Procedure: CO2 LASER APPLICATION;  Surgeon: Terrance Mass, MD;  Location: [facility];  Service: Gynecology;  Laterality:  N/A;CO2 laser of vaginal and vulvar lesions  . Gynecologic cryosurgery    . Dilation and curettage of uterus  multiple -- last one 1975  . Augmentation mammaplasty      SALINE  . Cystoscopy with retrograde pyelogram, ureteroscopy and stent placement Right 11/22/2013    Procedure: CYSTOSCOPY WITH RETROGRADE PYELOGRAM, URETEROSCOPY AND STENT PLACEMENT;  Surgeon: Sharyn Creamer, MD;  Location: [facility];  Service: Urology;  Laterality: Right;  . Cystoscopy/retrograde/ureteroscopy Right 12/01/2013    Procedure: CYSTOSCOPY, RIGHT RETROGRADE/RIGHT DIGITAL URETEROSCOPY, RIGHT URETERAL STENT EXCHANGE;  Surgeon: Sharyn Creamer, MD;  Location: [facility];  Service: Urology;  Laterality: Right;  STAGED RIGHT URETEROSCOPY RIGHT URETER DILATION    . Holmium laser application Right 08/10/7671    Procedure: HOLMIUM LASER APPLICATION;  Surgeon: Sharyn Creamer, MD;  Location: [facility];  Service: Urology;   Laterality: Right;  . Total hip arthroplasty Left 09/16/2014    Procedure: LEFT TOTAL HIP ARTHROPLASTY ANTERIOR APPROACH;  Surgeon: Mcarthur Rossetti, MD;  Location: [facility];  Service: Orthopedics;  Laterality: Left;    Family History  Problem Relation Age of Onset  . Heart disease Mother   . Tuberous sclerosis Mother   . Tuberous sclerosis Sister   . Tuberous sclerosis Daughter   . Tuberous sclerosis Son   . Tuberous sclerosis Maternal Aunt   . Tuberous sclerosis Maternal Grandmother   . Tuberous sclerosis Maternal Aunt   . Tuberous sclerosis Maternal Aunt   . Tuberous sclerosis Cousin    Social History:  reports that she quit smoking about 16 years ago. Her smoking use included Cigarettes. She has a 40 pack-year smoking history. She has never used smokeless tobacco. She reports that she does not drink alcohol or use illicit drugs.  Allergies:  Allergies  Allergen Reactions  . Percocet [Oxycodone-Acetaminophen] Nausea And Vomiting  . Zoloft [Sertraline Hcl]     Pt can not take SSRI  . Contrast Media [Iodinated Diagnostic Agents] Rash    States was oral contrast- not betadine based IV contrast    Medications Prior to Admission  Medication Sig Dispense Refill  . cholecalciferol (VITAMIN D) 1000 UNITS tablet Take 4,000 Units by mouth daily.     . cyclobenzaprine (FLEXERIL) 10 MG tablet Take 10 mg by mouth 3 (three) times daily as needed for muscle spasms.    . furosemide (LASIX) 20 MG tablet TAKE 1 TABLET BY MOUTH DAILY 90 tablet 0  . HYDROcodone-acetaminophen (NORCO/VICODIN) 5-325 MG per tablet Take 1 tablet by mouth at bedtime as needed for moderate pain. (Patient taking differently: Take 2 tablets by mouth every 4 (four) hours as needed for moderate pain. ) 60 tablet 0  . omeprazole (PRILOSEC) 40 MG capsule Take 40 mg by mouth daily.    . valACYclovir (VALTREX) 1000 MG tablet Take 1 tablet (1,000 mg total) by mouth 3 (three) times daily. (Patient taking differently: Take  1,000 mg by mouth 3 (three) times daily as needed (for outbreaks). ) 21 tablet 0  . Vitamin D, Ergocalciferol, (DRISDOL) 50000 UNITS CAPS capsule Take 50,000 Units by mouth 2 (two) times a week. Takes on Sunday and Wednesday    . warfarin (COUMADIN) 7.5 MG tablet Take 7.5 mg by mouth daily.      No results found for this or any previous visit (from the past 48 hour(s)). Dg C-arm 1-60 Min-no Report  10/16/2014   CLINICAL DATA: closed reduction left hip   C-ARM 1-60 MINUTES  Fluoroscopy was utilized by the requesting physician.  No radiographic  interpretation.    Dg Hip Unilat With Pelvis 1v Left  10/16/2014   CLINICAL DATA:  Reduction of left hip replacement dislocation.  EXAM: DG C-ARM 1-60 MIN - NRPT [facility]; LEFT HIP (WITH PELVIS) 1 VIEW  COMPARISON:  10/16/2014  FINDINGS: A single intraoperative view of the left hip is submitted postoperatively for interpretation.  The femoral head prosthesis appears located in the acetabular component.  IMPRESSION: Relocated femoral head prosthesis of total hip replacement.   Electronically Signed   By: Margarette Canada M.D.   On: 10/16/2014 15:24   Dg Hip Unilat With Pelvis 2-3 Views Left  10/16/2014   CLINICAL DATA:  Left hip pain today. Left hip surgery 4 weeks ago. Initial encounter.  EXAM: LEFT HIP (WITH PELVIS) 2-3 VIEWS  COMPARISON:  09/16/2014  FINDINGS: Left total hip arthroplasty identified. There has been interval posterior medial dislocation of the femoral head component.  No acute fracture is identified.  Moderate -severe degenerative changes in the right hip noted.  IMPRESSION: Posterior medial dislocation of femoral head component of left total hip arthroplasty.   Electronically Signed   By: Margarette Canada M.D.   On: 10/16/2014 12:37    Review of Systems  All other systems reviewed and are negative.   Blood pressure 102/58, pulse 115, temperature 98.6 F (37 C), temperature source Oral, resp. rate 18, height 5' 1.7" (1.567 m), weight 91.354 kg (201 lb  6.4 oz), SpO2 97 %. Physical Exam  Constitutional: She is oriented to person, place, and time. She appears well-developed and well-nourished.  HENT:  Head: Normocephalic and atraumatic.  Eyes: EOM are normal. Pupils are equal, round, and reactive to light.  Neck: Normal range of motion. Neck supple.  Cardiovascular: Normal rate and regular rhythm.   Respiratory: Effort normal and breath sounds normal.  GI: Soft. Bowel sounds are normal.  Musculoskeletal:       Left hip: She exhibits decreased strength.  Neurological: She is alert and oriented to person, place, and time.  Skin: Skin is warm and dry.  Psychiatric: She has a normal mood and affect.   Her leg lengths are very close to equal, if not slightly longer on her left operative side.   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  I will keep her on bed rest today and obtain a CT scan of her pelvis to assess implant positioning, fracture, etc.  She will likely need a revision tomorrow afternoon involving increasing her leg length and possibly a constrained liner.  She does understand this and I will discuss it further with her this evening.  BLACKMAN,CHRISTOPHER Y 10/17/2014, 7:25 AM

## 2014-10-18 ENCOUNTER — Inpatient Hospital Stay (HOSPITAL_COMMUNITY): Payer: Medicare Other | Admitting: Anesthesiology

## 2014-10-18 ENCOUNTER — Encounter (HOSPITAL_COMMUNITY)
Admission: RE | Disposition: A | Discharge status: Home Health | Payer: Self-pay | Source: Other Acute Inpatient Hospital | Attending: Orthopaedic Surgery

## 2014-10-18 ENCOUNTER — Inpatient Hospital Stay (HOSPITAL_COMMUNITY): Payer: Medicare Other

## 2014-10-18 HISTORY — PX: ANTERIOR HIP REVISION: SHX6527

## 2014-10-18 LAB — PROTIME-INR
INR: 1.46 (ref 0.00–1.49)
Prothrombin Time: 17.9 seconds — ABNORMAL HIGH (ref 11.6–15.2)

## 2014-10-18 LAB — SURGICAL PCR SCREEN
MRSA, PCR: NEGATIVE
Staphylococcus aureus: NEGATIVE

## 2014-10-18 SURGERY — REVISION, TOTAL ARTHROPLASTY, HIP, ANTERIOR APPROACH
Anesthesia: General | Site: Hip | Laterality: Left

## 2014-10-18 MED ORDER — METOCLOPRAMIDE HCL 5 MG PO TABS: 5.0000 mg | ORAL_TABLET | Freq: Three times a day (TID) | ORAL | Status: DC | PRN

## 2014-10-18 MED ORDER — GLYCOPYRROLATE 0.2 MG/ML IJ SOLN
INTRAMUSCULAR | Status: DC | PRN
  Administered 2014-10-18: .8 mg via INTRAVENOUS

## 2014-10-18 MED ORDER — NEOSTIGMINE METHYLSULFATE 10 MG/10ML IV SOLN
INTRAVENOUS | Status: DC | PRN
  Administered 2014-10-18: 5 mg via INTRAVENOUS

## 2014-10-18 MED ORDER — STERILE WATER FOR INJECTION IJ SOLN
INTRAMUSCULAR | Status: AC
  Filled 2014-10-18: qty 10

## 2014-10-18 MED ORDER — MENTHOL 3 MG MT LOZG
1.0000 | LOZENGE | OROMUCOSAL | Status: DC | PRN
  Filled 2014-10-18: qty 9

## 2014-10-18 MED ORDER — ZOLPIDEM TARTRATE 5 MG PO TABS: 5.0000 mg | ORAL_TABLET | Freq: Every evening | ORAL | Status: DC | PRN

## 2014-10-18 MED ORDER — WARFARIN - PHARMACIST DOSING INPATIENT: Freq: Every day | Status: DC

## 2014-10-18 MED ORDER — METHOCARBAMOL 500 MG PO TABS
ORAL_TABLET | ORAL | Status: AC
  Filled 2014-10-18: qty 1

## 2014-10-18 MED ORDER — MIDAZOLAM HCL 2 MG/2ML IJ SOLN
INTRAMUSCULAR | Status: AC
  Filled 2014-10-18: qty 2

## 2014-10-18 MED ORDER — OXYCODONE HCL 5 MG PO TABS
ORAL_TABLET | ORAL | Status: AC
  Filled 2014-10-18: qty 1

## 2014-10-18 MED ORDER — ONDANSETRON HCL 4 MG PO TABS: 4.0000 mg | ORAL_TABLET | Freq: Four times a day (QID) | ORAL | Status: DC | PRN

## 2014-10-18 MED ORDER — PROPOFOL 10 MG/ML IV BOLUS
INTRAVENOUS | Status: AC
  Filled 2014-10-18: qty 20

## 2014-10-18 MED ORDER — ONDANSETRON HCL 4 MG/2ML IJ SOLN
INTRAMUSCULAR | Status: DC | PRN
  Administered 2014-10-18: 4 mg via INTRAVENOUS

## 2014-10-18 MED ORDER — LIDOCAINE HCL (CARDIAC) 20 MG/ML IV SOLN
INTRAVENOUS | Status: AC
  Filled 2014-10-18: qty 5

## 2014-10-18 MED ORDER — ROCURONIUM BROMIDE 50 MG/5ML IV SOLN
INTRAVENOUS | Status: AC
  Filled 2014-10-18: qty 1

## 2014-10-18 MED ORDER — CEFAZOLIN SODIUM-DEXTROSE 2-3 GM-% IV SOLR
INTRAVENOUS | Status: DC | PRN
  Administered 2014-10-18: 2 g via INTRAVENOUS

## 2014-10-18 MED ORDER — ROCURONIUM BROMIDE 100 MG/10ML IV SOLN
INTRAVENOUS | Status: DC | PRN
  Administered 2014-10-18: 40 mg via INTRAVENOUS

## 2014-10-18 MED ORDER — ONDANSETRON HCL 4 MG/2ML IJ SOLN: 4.0000 mg | Freq: Four times a day (QID) | INTRAMUSCULAR | Status: DC | PRN

## 2014-10-18 MED ORDER — LIDOCAINE HCL (CARDIAC) 20 MG/ML IV SOLN
INTRAVENOUS | Status: DC | PRN
  Administered 2014-10-18: 40 mg via INTRAVENOUS

## 2014-10-18 MED ORDER — LACTATED RINGERS IV SOLN
INTRAVENOUS | Status: DC
  Administered 2014-10-18 (×3): via INTRAVENOUS

## 2014-10-18 MED ORDER — MIDAZOLAM HCL 5 MG/5ML IJ SOLN
INTRAMUSCULAR | Status: DC | PRN
  Administered 2014-10-18: 2 mg via INTRAVENOUS

## 2014-10-18 MED ORDER — HYDROMORPHONE HCL 1 MG/ML IJ SOLN
INTRAMUSCULAR | Status: AC
  Filled 2014-10-18: qty 1

## 2014-10-18 MED ORDER — CEFAZOLIN SODIUM-DEXTROSE 2-3 GM-% IV SOLR
2.0000 g | Freq: Four times a day (QID) | INTRAVENOUS | Status: AC
  Administered 2014-10-18 – 2014-10-19 (×2): 2 g via INTRAVENOUS
  Filled 2014-10-18 (×2): qty 50

## 2014-10-18 MED ORDER — OXYCODONE HCL 5 MG PO TABS
5.0000 mg | ORAL_TABLET | ORAL | Status: DC | PRN
  Administered 2014-10-18: 5 mg via ORAL
  Administered 2014-10-18 – 2014-10-21 (×8): 10 mg via ORAL
  Administered 2014-10-21: 5 mg via ORAL
  Administered 2014-10-21 – 2014-10-22 (×2): 10 mg via ORAL
  Filled 2014-10-18 (×10): qty 2
  Filled 2014-10-18: qty 1

## 2014-10-18 MED ORDER — METHOCARBAMOL 1000 MG/10ML IJ SOLN
500.0000 mg | Freq: Four times a day (QID) | INTRAVENOUS | Status: DC | PRN
  Filled 2014-10-18: qty 5

## 2014-10-18 MED ORDER — ALUM & MAG HYDROXIDE-SIMETH 200-200-20 MG/5ML PO SUSP: 30.0000 mL | ORAL | Status: DC | PRN

## 2014-10-18 MED ORDER — PHENOL 1.4 % MT LIQD
1.0000 | OROMUCOSAL | Status: DC | PRN
  Filled 2014-10-18: qty 177

## 2014-10-18 MED ORDER — PROPOFOL 10 MG/ML IV BOLUS
INTRAVENOUS | Status: DC | PRN
  Administered 2014-10-18: 100 mg via INTRAVENOUS

## 2014-10-18 MED ORDER — DIPHENHYDRAMINE HCL 12.5 MG/5ML PO ELIX: 12.5000 mg | ORAL_SOLUTION | ORAL | Status: DC | PRN

## 2014-10-18 MED ORDER — FENTANYL CITRATE (PF) 100 MCG/2ML IJ SOLN
INTRAMUSCULAR | Status: DC | PRN
  Administered 2014-10-18 (×6): 50 ug via INTRAVENOUS

## 2014-10-18 MED ORDER — METHOCARBAMOL 500 MG PO TABS
500.0000 mg | ORAL_TABLET | Freq: Four times a day (QID) | ORAL | Status: DC | PRN
  Administered 2014-10-19 (×2): 500 mg via ORAL

## 2014-10-18 MED ORDER — WARFARIN SODIUM 7.5 MG PO TABS
7.5000 mg | ORAL_TABLET | Freq: Once | ORAL | Status: AC
  Administered 2014-10-18: 7.5 mg via ORAL
  Filled 2014-10-18: qty 1

## 2014-10-18 MED ORDER — FENTANYL CITRATE (PF) 250 MCG/5ML IJ SOLN
INTRAMUSCULAR | Status: AC
  Filled 2014-10-18: qty 5

## 2014-10-18 MED ORDER — SODIUM CHLORIDE 0.9 % IJ SOLN
INTRAMUSCULAR | Status: AC
  Filled 2014-10-18: qty 10

## 2014-10-18 MED ORDER — METOCLOPRAMIDE HCL 5 MG/ML IJ SOLN: 5.0000 mg | Freq: Three times a day (TID) | INTRAMUSCULAR | Status: DC | PRN

## 2014-10-18 MED ORDER — SODIUM CHLORIDE 0.9 % IR SOLN
Status: DC | PRN
  Administered 2014-10-18: 3000 mL

## 2014-10-18 MED ORDER — ONDANSETRON HCL 4 MG/2ML IJ SOLN
INTRAMUSCULAR | Status: AC
  Filled 2014-10-18: qty 2

## 2014-10-18 MED ORDER — SODIUM CHLORIDE 0.9 % IV SOLN
INTRAVENOUS | Status: DC
  Administered 2014-10-18: 23:00:00 via INTRAVENOUS

## 2014-10-18 MED ORDER — DOCUSATE SODIUM 100 MG PO CAPS
100.0000 mg | ORAL_CAPSULE | Freq: Two times a day (BID) | ORAL | Status: DC
  Administered 2014-10-18 – 2014-10-22 (×8): 100 mg via ORAL
  Filled 2014-10-18 (×8): qty 1

## 2014-10-18 MED ORDER — 0.9 % SODIUM CHLORIDE (POUR BTL) OPTIME
TOPICAL | Status: DC | PRN
  Administered 2014-10-18: 1000 mL

## 2014-10-18 MED ORDER — HYDROMORPHONE HCL 1 MG/ML IJ SOLN
1.0000 mg | INTRAMUSCULAR | Status: DC | PRN
  Administered 2014-10-19: 1 mg via INTRAVENOUS
  Filled 2014-10-18: qty 1

## 2014-10-18 MED ORDER — HYDROMORPHONE HCL 1 MG/ML IJ SOLN
0.2500 mg | INTRAMUSCULAR | Status: DC | PRN
  Administered 2014-10-18 (×4): 0.5 mg via INTRAVENOUS

## 2014-10-18 SURGICAL SUPPLY — 60 items
APL SKNCLS STERI-STRIP NONHPOA (GAUZE/BANDAGES/DRESSINGS) ×1
BALL HIP CERAMIC 32MM PLUS 9 IMPLANT
BENZOIN TINCTURE PRP APPL 2/3 (GAUZE/BANDAGES/DRESSINGS) ×3 IMPLANT
BLADE SAW SGTL 18X1.27X75 (BLADE) ×2 IMPLANT
BLADE SAW SGTL 18X1.27X75MM (BLADE) ×1
BLADE SURG ROTATE 9660 (MISCELLANEOUS) IMPLANT
BNDG GAUZE ELAST 4 BULKY (GAUZE/BANDAGES/DRESSINGS) IMPLANT
CELLS DAT CNTRL 66122 CELL SVR (MISCELLANEOUS) ×1 IMPLANT
CLOSURE STERI-STRIP 1/2X4 (GAUZE/BANDAGES/DRESSINGS) ×1
CLOSURE WOUND 1/2 X4 (GAUZE/BANDAGES/DRESSINGS) ×2
CLSR STERI-STRIP ANTIMIC 1/2X4 (GAUZE/BANDAGES/DRESSINGS) ×1 IMPLANT
COVER SURGICAL LIGHT HANDLE (MISCELLANEOUS) ×3 IMPLANT
DRAPE C-ARM 42X72 X-RAY (DRAPES) ×3 IMPLANT
DRAPE IMP U-DRAPE 54X76 (DRAPES) ×3 IMPLANT
DRAPE STERI IOBAN 125X83 (DRAPES) ×3 IMPLANT
DRAPE U-SHAPE 47X51 STRL (DRAPES) ×9 IMPLANT
DRSG AQUACEL AG ADV 3.5X10 (GAUZE/BANDAGES/DRESSINGS) ×3 IMPLANT
DRSG VAC ATS SM SENSATRAC (GAUZE/BANDAGES/DRESSINGS) ×2 IMPLANT
DURAPREP 26ML APPLICATOR (WOUND CARE) ×3 IMPLANT
ELECT BLADE 4.0 EZ CLEAN MEGAD (MISCELLANEOUS) ×3
ELECT BLADE 6.5 EXT (BLADE) ×2 IMPLANT
ELECT CAUTERY BLADE 6.4 (BLADE) ×3 IMPLANT
ELECT REM PT RETURN 9FT ADLT (ELECTROSURGICAL) ×3
ELECTRODE BLDE 4.0 EZ CLN MEGD (MISCELLANEOUS) IMPLANT
ELECTRODE REM PT RTRN 9FT ADLT (ELECTROSURGICAL) ×1 IMPLANT
FACESHIELD WRAPAROUND (MASK) ×6 IMPLANT
FACESHIELD WRAPAROUND OR TEAM (MASK) ×2 IMPLANT
GAUZE XEROFORM 5X9 LF (GAUZE/BANDAGES/DRESSINGS) ×2 IMPLANT
GLOVE BIOGEL PI IND STRL 8 (GLOVE) ×2 IMPLANT
GLOVE BIOGEL PI INDICATOR 8 (GLOVE) ×4
GLOVE ECLIPSE 8.0 STRL XLNG CF (GLOVE) ×3 IMPLANT
GLOVE ORTHO TXT STRL SZ7.5 (GLOVE) ×6 IMPLANT
GOWN STRL REUS W/ TWL XL LVL3 (GOWN DISPOSABLE) ×2 IMPLANT
GOWN STRL REUS W/TWL XL LVL3 (GOWN DISPOSABLE) ×6
HANDPIECE INTERPULSE COAX TIP (DISPOSABLE) ×3
HIP BALL CERAMIC 32MM PLUS 9 ×3 IMPLANT
KIT BASIN OR (CUSTOM PROCEDURE TRAY) ×3 IMPLANT
KIT ROOM TURNOVER OR (KITS) ×3 IMPLANT
MANIFOLD NEPTUNE II (INSTRUMENTS) ×3 IMPLANT
NS IRRIG 1000ML POUR BTL (IV SOLUTION) ×3 IMPLANT
PACK TOTAL JOINT (CUSTOM PROCEDURE TRAY) ×3 IMPLANT
PACK UNIVERSAL I (CUSTOM PROCEDURE TRAY) ×3 IMPLANT
PAD ARMBOARD 7.5X6 YLW CONV (MISCELLANEOUS) ×6 IMPLANT
RETRACTOR WND ALEXIS 18 MED (MISCELLANEOUS) ×1 IMPLANT
RTRCTR WOUND ALEXIS 18CM MED (MISCELLANEOUS) ×3
SET HNDPC FAN SPRY TIP SCT (DISPOSABLE) ×1 IMPLANT
SPONGE LAP 4X18 X RAY DECT (DISPOSABLE) IMPLANT
STRIP CLOSURE SKIN 1/2X4 (GAUZE/BANDAGES/DRESSINGS) ×4 IMPLANT
SUT ETHIBOND NAB CT1 #1 30IN (SUTURE) ×3 IMPLANT
SUT MNCRL AB 4-0 PS2 18 (SUTURE) ×3 IMPLANT
SUT VIC AB 0 CT1 27 (SUTURE) ×3
SUT VIC AB 0 CT1 27XBRD ANBCTR (SUTURE) ×1 IMPLANT
SUT VIC AB 1 CT1 27 (SUTURE) ×3
SUT VIC AB 1 CT1 27XBRD ANBCTR (SUTURE) ×1 IMPLANT
SUT VIC AB 2-0 CT1 27 (SUTURE) ×3
SUT VIC AB 2-0 CT1 TAPERPNT 27 (SUTURE) ×1 IMPLANT
TOWEL OR 17X24 6PK STRL BLUE (TOWEL DISPOSABLE) ×3 IMPLANT
TOWEL OR 17X26 10 PK STRL BLUE (TOWEL DISPOSABLE) ×3 IMPLANT
TRAY FOLEY CATH 16FRSI W/METER (SET/KITS/TRAYS/PACK) IMPLANT
WATER STERILE IRR 1000ML POUR (IV SOLUTION) ×6 IMPLANT

## 2014-10-18 NOTE — Anesthesia Preprocedure Evaluation (Addendum)
Anesthesia Evaluation  Patient identified by MRN, date of birth, ID band Patient awake    Reviewed: Allergy & Precautions, H&P , NPO status , Patient's Chart, lab work & pertinent test results  History of Anesthesia Complications (+) PONV  Airway Mallampati: II  TM Distance: >3 FB Neck ROM: Full    Dental no notable dental hx. (+) Teeth Intact, Dental Advisory Given   Pulmonary sleep apnea , former smoker,  breath sounds clear to auscultation  Pulmonary exam normal       Cardiovascular Rhythm:Regular Rate:Normal     Neuro/Psych negative neurological ROS  negative psych ROS   GI/Hepatic Neg liver ROS, GERD-  Medicated and Controlled,  Endo/Other  Morbid obesity  Renal/GU Renal disease  negative genitourinary   Musculoskeletal  (+) Arthritis -, Osteoarthritis,    Abdominal   Peds  Hematology negative hematology ROS (+)   Anesthesia Other Findings   Reproductive/Obstetrics negative OB ROS                            Anesthesia Physical Anesthesia Plan  ASA: III  Anesthesia Plan: General   Post-op Pain Management:    Induction: Intravenous  Airway Management Planned: Oral ETT  Additional Equipment:   Intra-op Plan:   Post-operative Plan: Extubation in OR  Informed Consent: I have reviewed the patients History and Physical, chart, labs and discussed the procedure including the risks, benefits and alternatives for the proposed anesthesia with the patient or authorized representative who has indicated his/her understanding and acceptance.   Dental advisory given  Plan Discussed with: CRNA  Anesthesia Plan Comments:         Anesthesia Quick Evaluation

## 2014-10-18 NOTE — Brief Op Note (Signed)
10/17/2014 - 10/18/2014  6:19 PM  PATIENT:  Janique M Hennick  69 y.o. female  PRE-OPERATIVE DIAGNOSIS:  recurrent dislocation left hip  POST-OPERATIVE DIAGNOSIS:  recurrent dislocation left hip  PROCEDURE:  Procedure(s): EXCHANGE LEFT HIP BALL OF DIRECT ANTERIOR TOTAL HIP ARTHROPLASTY (Left)  SURGEON:  Surgeon(s) and Role:    * Mcarthur Rossetti, MD - Primary  PHYSICIAN ASSISTANT: Benita Stabile, PA-C  ANESTHESIA:   general  EBL:  Total I/O In: 1287 [P.O.:240; I.V.:1000] Out: 500 [Blood:500]  BLOOD ADMINISTERED:none  DRAINS: none   LOCAL MEDICATIONS USED:  NONE  SPECIMEN:  No Specimen  DISPOSITION OF SPECIMEN:  N/A  COUNTS:  YES  TOURNIQUET:  * No tourniquets in log *  DICTATION: .Other Dictation: Dictation Number [id number]  PLAN OF CARE: Admit to inpatient   PATIENT DISPOSITION:  PACU - hemodynamically stable.   Delay start of Pharmacological VTE agent (>24hrs) due to surgical blood loss or risk of bleeding: no

## 2014-10-18 NOTE — Transfer of Care (Signed)
Immediate Anesthesia Transfer of Care Note  Patient: [name]  Procedure(s) Performed: Procedure(s): EXCHANGE LEFT HIP BALL OF DIRECT ANTERIOR TOTAL HIP ARTHROPLASTY (Left)  Patient Location: PACU  Anesthesia Type:General  Level of Consciousness: awake, alert  and oriented  Airway & Oxygen Therapy: Patient Spontanous Breathing and Patient connected to nasal cannula oxygen  Post-op Assessment: Report given to RN and Post -op Vital signs reviewed and stable  Post vital signs: Reviewed and stable  Last Vitals:  Filed Vitals:   10/18/14 1440  BP: 117/66  Pulse: 114  Temp: 37.4 C  Resp: 16    Complications: No apparent anesthesia complications

## 2014-10-18 NOTE — Progress Notes (Signed)
ANTICOAGULATION CONSULT NOTE - Initial Consult  Pharmacy Consult for Coumadin Indication: DVT prophylaxis  Allergies  Allergen Reactions  . Percocet [Oxycodone-Acetaminophen] Nausea And Vomiting  . Zoloft [Sertraline Hcl]     Pt can not take SSRI  . Contrast Media [Iodinated Diagnostic Agents] Rash    States was oral contrast- not betadine based IV contrast    Patient Measurements: Height: 5' 1.7" (156.7 cm) Weight: 201 lb 6.4 oz (91.354 kg) IBW/kg (Calculated) : 49.41 Heparin Dosing Weight: n/a  Vital Signs: Temp: 98 F (36.7 C) (05/17 1930) BP: 143/63 mmHg (05/17 1930) Pulse Rate: 75 (05/17 1930)  Labs:  Recent Labs  10/17/14 0850 10/18/14 0453  HGB 11.2*  --   HCT 34.0*  --   PLT 230  --   LABPROT 21.5* 17.9*  INR 1.85* 1.46  CREATININE 0.69  --     Estimated Creatinine Clearance: 71.3 mL/min (by C-G formula based on Cr of 0.69).   Medical History: Past Medical History  Diagnosis Date  . Arthritis   . Diverticulosis   . GERD (gastroesophageal reflux disease)   . Osteoporosis   . Tuberous sclerosis     EXTERNAL LESIONS--  MAINLY HANDS (INTERNAL SCLEROTIC BONY LESIONS LUMBAR & PELVIS PER CT 11/2013)  . Seasonal allergies   . H/O hiatal hernia   . History of diverticulitis of colon   . Nodule of right lung     BENIGN--- AND STABLE PER  CT  11/2013  . History of gastric ulcer   . History of epilepsy     childhood age 38 to 3  ---secondary to high calcium deposts of optic nerve---  none since age 6 with pregency  . IBS (irritable bowel syndrome)   . History of kidney stones   . History of shingles     MARCH 2014--  BACK AREA  . History of vulvar dysplasia     S/P  LASER ABLATION 2014  . Right ureteral stone   . Ureteral stenosis, right   . DDD (degenerative disc disease), lumbosacral   . Renal cyst, left     STABLE PER CT 11/2013  . PONV (postoperative nausea and vomiting)     occurred back in 1970's   . Anginal pain     hx of pt relates to  stress last occurrence 8 to 10 years ago   . Tachycardia   . Mild obstructive sleep apnea     SLEEP STUDY  11-19-2012--  NO CPAP RX (DID NOT MEET CRITIRIA)  . Shortness of breath dyspnea     walking distances and climbing stairs  . Bronchitis     hx of   . Phlebitis     hx of     Assessment: 69 yo female admitted with recurrent hip dislocation of left total hip prosthesis.  S/p THR 4 weeks ago.  On Coumadin PTA, I believe just for DVT prophylaxis s/p initial hip surgery.  Pharmacy asked to resume Coumadin.  Today's INR subtherapeutic at 1.46 after holding a few doses prior to surgery.  Last PTA Coumadin dose was 5/14.  Dose = 7.5 mg daily.  Goal of Therapy:  INR 2-3 Monitor platelets by anticoagulation protocol: Yes   Plan:  1. Coumadin 7.5 mg x 1 tonight. 2. Daily INR. 3. Will need to f/u with MD re: planned time to continue Coumadin.  Uvaldo Rising, BCPS  Clinical Pharmacist Pager [phone]  10/18/2014 8:21 PM

## 2014-10-18 NOTE — Anesthesia Procedure Notes (Signed)
Procedure Name: Intubation Date/Time: 10/18/2014 4:21 PM Performed by: Clearnce Sorrel Pre-anesthesia Checklist: Patient identified, Timeout performed, Emergency Drugs available, Suction available and Patient being monitored Patient Re-evaluated:Patient Re-evaluated prior to inductionOxygen Delivery Method: Circle system utilized Preoxygenation: Pre-oxygenation with 100% oxygen Intubation Type: IV induction Ventilation: Mask ventilation without difficulty Laryngoscope Size: Mac and 3 Grade View: Grade III Tube type: Oral Tube size: 7.5 mm Number of attempts: 2 Airway Equipment and Method: Bougie stylet Placement Confirmation: positive ETCO2 and breath sounds checked- equal and bilateral Secured at: 22 cm Tube secured with: Tape Dental Injury: Teeth and Oropharynx as per pre-operative assessment

## 2014-10-19 ENCOUNTER — Telehealth: Payer: Self-pay | Admitting: Internal Medicine

## 2014-10-19 ENCOUNTER — Encounter (HOSPITAL_COMMUNITY): Payer: Self-pay | Admitting: Orthopaedic Surgery

## 2014-10-19 LAB — CBC
HCT: 30.1 % — ABNORMAL LOW (ref 36.0–46.0)
HEMOGLOBIN: 9.8 g/dL — AB (ref 12.0–15.0)
MCH: 29.3 pg (ref 26.0–34.0)
MCHC: 32.6 g/dL (ref 30.0–36.0)
MCV: 89.9 fL (ref 78.0–100.0)
Platelets: 232 10*3/uL (ref 150–400)
RBC: 3.35 MIL/uL — AB (ref 3.87–5.11)
RDW: 13.8 % (ref 11.5–15.5)
WBC: 8.2 10*3/uL (ref 4.0–10.5)

## 2014-10-19 LAB — BASIC METABOLIC PANEL
Anion gap: 8 (ref 5–15)
BUN: 8 mg/dL (ref 6–20)
CHLORIDE: 96 mmol/L — AB (ref 101–111)
CO2: 29 mmol/L (ref 22–32)
Calcium: 8.2 mg/dL — ABNORMAL LOW (ref 8.9–10.3)
Creatinine, Ser: 0.64 mg/dL (ref 0.44–1.00)
GFR calc Af Amer: >60 mL/min (ref >60)
GFR calc non Af Amer: >60 mL/min (ref >60)
GLUCOSE: 137 mg/dL — AB (ref 65–99)
Potassium: 3.3 mmol/L — ABNORMAL LOW (ref 3.5–5.1)
SODIUM: 133 mmol/L — AB (ref 135–145)

## 2014-10-19 MED ORDER — VALACYCLOVIR HCL 500 MG PO TABS
1000.0000 mg | ORAL_TABLET | Freq: Three times a day (TID) | ORAL | Status: DC | PRN
  Administered 2014-10-19 (×2): 1000 mg via ORAL
  Filled 2014-10-19 (×2): qty 2

## 2014-10-19 MED ORDER — VITAMIN D (ERGOCALCIFEROL) 1.25 MG (50000 UNIT) PO CAPS
50000.0000 [IU] | ORAL_CAPSULE | ORAL | Status: DC
  Administered 2014-10-19: 50000 [IU] via ORAL
  Filled 2014-10-19 (×2): qty 1

## 2014-10-19 MED ORDER — ASPIRIN EC 325 MG PO TBEC
325.0000 mg | DELAYED_RELEASE_TABLET | Freq: Two times a day (BID) | ORAL | Status: DC
  Administered 2014-10-19 – 2014-10-22 (×7): 325 mg via ORAL
  Filled 2014-10-19 (×7): qty 1

## 2014-10-19 NOTE — Progress Notes (Signed)
Physical Therapy Treatment Patient Details Name: [name] MRN: [id number] DOB: Sep 18, 1945 Today's Date: 10/19/2014    History of Present Illness 69 y.o. female admitted for Recurrent dislocation of left anterior hip. s/p Exchange of left total hip arthroplasty ceramic hip ball.    PT Comments    Progressing towards PT goals. Tolerated stair training well, with min assist for support. Ambulating with supervision up to 275 feet this afternoon. Will continue to follow and progress functional abilities until d/c. Patient will continue to benefit from skilled physical therapy services to further improve independence with functional mobility.   Follow Up Recommendations  Home health PT;Supervision for mobility/OOB     Equipment Recommendations  None recommended by PT    Recommendations for Other Services       Precautions / Restrictions Precautions Precautions: None Precaution Comments: Instructed to limit movements consistent with anterior hip precautions due to hx of dislocation. No orders set however. Restrictions Weight Bearing Restrictions: Yes LLE Weight Bearing: Weight bearing as tolerated    Mobility  Bed Mobility               General bed mobility comments: sitting in chair  Transfers Overall transfer level: Needs assistance Equipment used: Rolling walker (2 wheeled) Transfers: Sit to/from Stand Sit to Stand: Supervision         General transfer comment: Supervision for safety. VC for hand placement. Slow to rise but good stability once holding RW for support.  Ambulation/Gait Ambulation/Gait assistance: Supervision Ambulation Distance (Feet): 275 Feet Assistive device: Rolling walker (2 wheeled) Gait Pattern/deviations: Step-through pattern;Decreased step length - right;Decreased stance time - left;Antalgic;Trunk flexed Gait velocity: decreased   General Gait Details: VC for walker placement for proximity, upright posture with forward gaze, and  instructions for symmetry of gait. No loss of balance. Pt tolerting well.   Stairs Stairs: Yes Stairs assistance: Min assist Stair Management: One rail Right;Step to pattern;Forwards Number of Stairs: 2 General stair comments: Pt reports she will have +2 assist to enter/exit home at d/c. Practiced today with single person hand held assist on Lt side, and rail for support on Rt. VC for sequencing of gait. Pt performed task well without buckling, requires extra time, min assist for stability.  Wheelchair Mobility    Modified Rankin (Stroke Patients Only)       Balance                                    Cognition Arousal/Alertness: Awake/alert Behavior During Therapy: WFL for tasks assessed/performed Overall Cognitive Status: Within Functional Limits for tasks assessed                      Exercises General Exercises - Lower Extremity Ankle Circles/Pumps: AROM;Both;10 reps;Seated Quad Sets: Strengthening;Both;10 reps;Seated    General Comments        Pertinent Vitals/Pain Pain Assessment: No/denies pain    Home Living Family/patient expects to be discharged to:: Private residence (staying with son) Living Arrangements: Alone Available Help at Discharge: Family;Available 24 hours/day (son leaving next wednesday 5/25) Type of Home: House Home Access: Stairs to enter Entrance Stairs-Rails: None Home Layout: Multi-level;Able to live on main level with bedroom/bathroom Home Equipment: Gilford Rile - 2 wheels;Cane - single point      Prior Function Level of Independence: Independent          PT Goals (current goals can now be found in  the care plan section) Acute Rehab PT Goals Patient Stated Goal: Go home PT Goal Formulation: With patient Time For Goal Achievement: 10/26/14 Potential to Achieve Goals: Good Progress towards PT goals: Progressing toward goals    Frequency  BID    PT Plan Current plan remains appropriate    Co-evaluation              End of Session   Activity Tolerance: Patient tolerated treatment well Patient left: in chair;with call bell/phone within reach     Time: 1530-1553 PT Time Calculation (min) (ACUTE ONLY): 23 min  Charges:  $Gait Training: 23-37 mins                    G Codes:      Ellouise Newer 2014-11-15, 4:24 PM Camille Bal [place], [place]

## 2014-10-19 NOTE — Evaluation (Signed)
Physical Therapy Evaluation Patient Details Name: [name] MRN: [id number] DOB: Jan 16, 1946 Today's Date: 10/19/2014   History of Present Illness  69 y.o. female admitted for Recurrent dislocation of left anterior hip. s/p Exchange of left total hip arthroplasty ceramic hip ball.  Clinical Impression  Patient is s/p above surgery presenting with functional limitations due to the deficits listed below (see PT Problem List). Ambulates up to 115 feet with supervision, transfers well. Complains of Lt knee pain and that it feels unstable however no buckling noted. Follow up this afternoon for stair training. Plans to stay with son at d/c.  Patient will benefit from skilled PT to increase their independence and safety with mobility to allow discharge to the venue listed below.       Follow Up Recommendations Home health PT;Supervision for mobility/OOB    Equipment Recommendations  None recommended by PT    Recommendations for Other Services       Precautions / Restrictions Precautions Precautions: None Precaution Comments: Instructed to limit movements consistent with anterior hip precautions due to hx of dislocation. No orders set however. Restrictions Weight Bearing Restrictions: Yes LLE Weight Bearing: Weight bearing as tolerated      Mobility  Bed Mobility Overal bed mobility: Needs Assistance Bed Mobility: Supine to Sit     Supine to sit: Min assist;HOB elevated     General bed mobility comments: Min assist for LLE support while scooting out of bed. Encouraged use of RLE for support of LLE however pt reports cramping in LLE. Use of rail.  Transfers Overall transfer level: Needs assistance Equipment used: Rolling walker (2 wheeled) Transfers: Sit to/from Stand Sit to Stand: Supervision         General transfer comment: supervision for boost to stand from lowest bed setting and BSC. VC for hand placement. No buckling noted.  Ambulation/Gait Ambulation/Gait  assistance: Supervision Ambulation Distance (Feet): 115 Feet Assistive device: Rolling walker (2 wheeled) Gait Pattern/deviations: Step-through pattern;Decreased step length - right;Decreased stance time - left;Antalgic;Trunk flexed Gait velocity: decreased   General Gait Details: Educated on safe DME use with a rolling walker. No buckling noted however pt reports Lt knee pain and feelings of instability. VC for upright posture.  Stairs            Wheelchair Mobility    Modified Rankin (Stroke Patients Only)       Balance Overall balance assessment: Needs assistance Sitting-balance support: No upper extremity supported;Feet supported Sitting balance-Leahy Scale: Good     Standing balance support: No upper extremity supported Standing balance-Leahy Scale: Fair                               Pertinent Vitals/Pain Pain Assessment: No/denies pain (LLE burns when I move only.)    Home Living Family/patient expects to be discharged to:: Private residence (staying with son) Living Arrangements: Alone Available Help at Discharge: Family;Available 24 hours/day (Son leaving next wednesday 5/25) Type of Home: House Home Access: Stairs to enter Entrance Stairs-Rails: None Entrance Stairs-Number of Steps: 2 Home Layout: Multi-level;Able to live on main level with bedroom/bathroom Home Equipment: Gilford Rile - 2 wheels;Cane - single point      Prior Function Level of Independence: Independent               Hand Dominance   Dominant Hand: Right    Extremity/Trunk Assessment   Upper Extremity Assessment: Defer to OT evaluation  Lower Extremity Assessment: LLE deficits/detail   LLE Deficits / Details: decreased strength and ROM as expected post op.     Communication   Communication: No difficulties  Cognition Arousal/Alertness: Awake/alert Behavior During Therapy: WFL for tasks assessed/performed Overall Cognitive Status: Within Functional  Limits for tasks assessed                      General Comments      Exercises General Exercises - Lower Extremity Ankle Circles/Pumps: AROM;Both;10 reps;Seated Quad Sets: Strengthening;Both;10 reps;Seated Gluteal Sets: Strengthening;Both;10 reps;Seated      Assessment/Plan    PT Assessment Patient needs continued PT services  PT Diagnosis Abnormality of gait;Difficulty walking;Acute pain   PT Problem List Decreased strength;Decreased range of motion;Decreased activity tolerance;Decreased balance;Decreased mobility;Decreased knowledge of use of DME;Obesity  PT Treatment Interventions DME instruction;Gait training;Stair training;Functional mobility training;Therapeutic activities;Therapeutic exercise;Balance training;Neuromuscular re-education;Patient/family education;Modalities   PT Goals (Current goals can be found in the Care Plan section) Acute Rehab PT Goals Patient Stated Goal: Go home PT Goal Formulation: With patient Time For Goal Achievement: 10/26/14 Potential to Achieve Goals: Good    Frequency BID   Barriers to discharge Decreased caregiver support lives alone, staying with son until next wednesday    Co-evaluation               End of Session   Activity Tolerance: Patient tolerated treatment well Patient left: in chair;with call bell/phone within reach Nurse Communication: Mobility status         Time: 3016-0109 PT Time Calculation (min) (ACUTE ONLY): 30 min   Charges:   PT Evaluation $Initial PT Evaluation Tier I: 1 Procedure PT Treatments $Gait Training: 8-22 mins   PT G CodesEllouise Newer 10/19/2014, 1:50 PM Elayne Snare, [place]

## 2014-10-19 NOTE — Telephone Encounter (Signed)
Patient called in and wanted to know if her monitor have captured enough info for Dr. Debara Pickett or if she should continue to wear - she is currently in hospital for orthopaedic related issues and is unsure when she will be released. She spoke with [employer] rep and was advised to contact Dr. Debara Pickett for advice on monitor.   Monitor report printed. Will show to Dr. Debara Pickett 5/19

## 2014-10-19 NOTE — Telephone Encounter (Signed)
Pt called in wanting to speak to Dr. Lysbeth Penner nurse about the heart monitor she was suppose to be wearing. Please call  Thanks

## 2014-10-19 NOTE — Anesthesia Postprocedure Evaluation (Signed)
  Anesthesia Post-op Note  Patient: [name]  Procedure(s) Performed: Procedure(s): EXCHANGE LEFT HIP BALL OF DIRECT ANTERIOR TOTAL HIP ARTHROPLASTY (Left)  Patient Location: PACU  Anesthesia Type:General  Level of Consciousness: awake and alert   Airway and Oxygen Therapy: Patient Spontanous Breathing  Post-op Pain: moderate  Post-op Assessment: Post-op Vital signs reviewed, Patient's Cardiovascular Status Stable and Respiratory Function Stable  Post-op Vital Signs: Reviewed  Filed Vitals:   10/19/14 1400  BP: 126/59  Pulse: 101  Temp: 38 C  Resp: 18    Complications: No apparent anesthesia complications

## 2014-10-19 NOTE — Progress Notes (Signed)
Patient ID: [name], female   DOB: 03-Dec-1945, 69 y.o.   MRN: [id number] Awake and alert.  Did well with surgery.  Acute blood loss anemia, but vitals stable.  Has VAC over closed incision just to keep dry due to drainage.  Can be up with therapy with full weight bearing as tolerated left hip.

## 2014-10-19 NOTE — Evaluation (Signed)
Occupational Therapy Evaluation and Discharge Patient Details Name: [name] MRN: [id number] DOB: May 11, 1946 Today's Date: 10/19/2014    History of Present Illness 69 y.o. female admitted for Recurrent dislocation of left anterior hip. s/p Exchange of left total hip arthroplasty ceramic hip ball.   Clinical Impression   PTA pt lived at home alone and was independent with ADLs. Pt currently limited by decreased ROM and pain in LLE but is at Supervision level for functional mobility. Pt has reacher and is familiar with use of AE and compensatory techniques from recent rehab stay. No further acute OT needs.     Follow Up Recommendations  No OT follow up    Equipment Recommendations  3 in 1 bedside comode    Recommendations for Other Services       Precautions / Restrictions Precautions Precautions: None Precaution Comments: Instructed to limit movements consistent with anterior hip precautions due to hx of dislocation. No orders set however. Restrictions Weight Bearing Restrictions: Yes LLE Weight Bearing: Weight bearing as tolerated      Mobility Bed Mobility Overal bed mobility: Needs Assistance Bed Mobility: Supine to Sit     Supine to sit: Min assist;HOB elevated     General bed mobility comments: Pt sitting in recliner when OT arrived.   Transfers Overall transfer level: Needs assistance Equipment used: Rolling walker (2 wheeled) Transfers: Sit to/from Stand Sit to Stand: Supervision         General transfer comment: Supervision for safety. Good hand placement    Balance Overall balance assessment: Needs assistance Sitting-balance support: No upper extremity supported;Feet supported Sitting balance-Leahy Scale: Good     Standing balance support: No upper extremity supported Standing balance-Leahy Scale: Fair                              ADL Overall ADL's : Needs assistance/impaired Eating/Feeding: Independent;Sitting    Grooming: Supervision/safety;Standing;Wash/dry hands   Upper Body Bathing: Set up;Sitting   Lower Body Bathing: Sit to/from stand;Supervison/ safety;Set up;With adaptive equipment   Upper Body Dressing : Set up;Sitting   Lower Body Dressing: Minimal assistance;Sit to/from stand Lower Body Dressing Details (indicate cue type and reason): pt has reacher and knows how to use it to don underwear/pants. May need assistance for shoes/socks Toilet Transfer: Supervision/safety;Ambulation;RW (BSC over toilet)   Toileting- Clothing Manipulation and Hygiene: Supervision/safety;Sit to/from stand       Functional mobility during ADLs: Supervision/safety;Rolling walker General ADL Comments: Pt familiar with compensatory techniques and AE from rehab stay recently.      Vision Additional Comments: No change from baseline          Pertinent Vitals/Pain Pain Assessment: No/denies pain (LLE burns when I move)     Hand Dominance Right   Extremity/Trunk Assessment Upper Extremity Assessment Upper Extremity Assessment: Overall WFL for tasks assessed   Lower Extremity Assessment Lower Extremity Assessment: Defer to PT evaluation LLE Deficits / Details: decreased strength and ROM as expected post op. LLE: Unable to fully assess due to pain   Cervical / Trunk Assessment Cervical / Trunk Assessment: Normal   Communication Communication Communication: No difficulties   Cognition Arousal/Alertness: Awake/alert Behavior During Therapy: WFL for tasks assessed/performed Overall Cognitive Status: Within Functional Limits for tasks assessed                                Home Living Family/patient  expects to be discharged to:: Private residence (staying with son) Living Arrangements: Alone Available Help at Discharge: Family;Available 24 hours/day (son leaving next wednesday 5/25) Type of Home: House Home Access: Stairs to enter [employer] of Steps: 2 Entrance  Stairs-Rails: None Home Layout: Multi-level;Able to live on main level with bedroom/bathroom     Bathroom Shower/Tub: Occupational psychologist: Standard     Home Equipment: Environmental consultant - 2 wheels;Cane - single point          Prior Functioning/Environment Level of Independence: Independent             OT Diagnosis: Generalized weakness;Acute pain    End of Session Equipment Utilized During Treatment: Rolling walker  Activity Tolerance: Patient tolerated treatment well Patient left: in chair;with call bell/phone within reach   Time: 1355-1427 OT Time Calculation (min): 32 min Charges:  OT General Charges $OT Visit: 1 Procedure OT Evaluation $Initial OT Evaluation Tier I: 1 Procedure OT Treatments $Self Care/Home Management : 8-22 mins G-Codes:    Villa Herb M 10-29-2014, 2:38 PM  Secundino Ginger Lynetta Mare, OTR/L Occupational Therapist [phone] (pager)

## 2014-10-19 NOTE — Op Note (Signed)
NAMEVEAR, STATON NO.:  [id number]  MEDICAL RECORD NO.:  [id number]  LOCATION:  [id number]                        FACILITY:  [facility]  PHYSICIAN:  Lind Guest. 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  PROCEDURE:  Exchange of left total hip arthroplasty ceramic hip ball from a 32+ 1 to a 32+ 9.  SURGEON:  Lind Guest. Ninfa Linden, M.D.  ASSISTANT:  Erskine Emery, PA-C.  ANESTHESIA:  General.  ANTIBIOTICS:  2 g of IV Ancef.  BLOOD LOSS:  About 500 mL.  COMPLICATIONS:  None.  INDICATIONS:  Ms. Cristo is a 69 year old morbidly obese female, who just over a month ago underwent a left direct anterior hip placement. This was performed at [facility] and without complications. At the time of surgery, I even felt that I may have made her just a touch long if not equal.  At the time of surgery, she appeared stable as well.  I saw her at her first postoperative visit, 2 weeks after surgery after doing well and she had no problems at all.  This past Sunday, however, she was in the shower, she had her leg positioned in awkward position for shaving and then slipped and the hip dislocated.  I saw her at [facility] Room and took her to the operating room, was able to easily close reduce it under mask ventilation of IV sedation.  I did feel there was some shuck to her hip, but otherwise felt stable; however, about 12 hours later at home, she dislocated again and she went to an outside hospital and reduced her hip easily.  I felt that she should be transferred here to [facility] because of two dislocations in the short amount of time, I felt that something need to be assessed further to  determine if there is anything else going on with the implant.  I then admitted her to [facility], obtained a CT scan and verified the positioning of my femoral and acetabular components, and there was no excessive anteversion at all in either component.  Even in leg length wise, she may be just a touch short, but the offsets were near equal.  However with two recurrent dislocations in just a 24-hour period, I felt that lengthening her and giving her more offset would be the most appropriate step to keep her from dislocating again and potentially her morbid obesity contributes to this as well, but certainly, I think this is warranted.  I talked to her in significant detail about this about mainly the risk of infection, the risks of recurrent dislocation if we do not proceed with surgery and the risk of continued dislocations even with surgery with, but hopefully with minimize this with lengthening her.  I felt I would be able to also assess intraoperatively the stability of the components in general.  She did wish to proceed with surgery after this discussion.  PROCEDURE DESCRIPTION:  After informed consent was obtained and  appropriate left hip was marked, she was brought to the operating room, and general anesthesia was obtained while she was on a stretcher. Traction boots were placed on both of her feet.  Next, she was placed supine on the Hana fracture table.  Before prepping and draping, I assessed her hip radiographically and again her offset leg lengths appeared equal.  Under direct fluoro, she also had minimal shuck.  I could externally rotate her to 90 degrees and I could not dislocate.  I took a significant force to bring her leg down and over and dislocate her.  I then relocated the hip and we prepped her hip with DuraPrep and sterile drapes.  Time-out was called, and she was identified as correct patient and correct left hip.  We then made an incision through her previous  incision and carried this down through significant adipose tissue to her joint finding a very large joint effusion and seroma.  We were then able to dislocate the hip and I removed the previous hip ball. I then assessed the anteversion of my femoral component and assessed if any loosening and I could not find any.  I assessed the acetabular component and it was not loose.  I was pleased with the version of it as well as the polyethylene liner.  I then trialed a 32+ 5 hip ball and reduced this in the acetabulum, and I could not dislocate it, past 90 degrees of external rotation, 60 degrees of internal rotation, and had minimal shuck or offset and leg lengths were measured equal.  With that being said, though, I felt that we should even go up one more hip ball size because she has severe disease on her other hip and felt that we could match her lengthwise later instability was the most important given her dislocation and obesity.  So then, we placed the real 32+ 9 ceramic hip ball and reduced this into the acetabulum and it was very tight.  I had significant trial with dislocating at all, which I was very pleased with.  I then irrigated 3 liters of normal saline solution throughout the hip, soft tissue and deep tissue.  We did not have any joint capsule to close the iliotibial band with interrupted #1 Vicryl suture followed by 0 Vicryl in the deep tissue, 2-0 Vicryl in the subcutaneous tissue, and staples on the skin.  I did place an incisional VAC due to her obesity potential for drainage, and I would like to leave this on for few days before sending her home.  Postoperatively, I will allow her to weightbear as tolerated, be up with therapy.  Of note, Erskine Emery, PA-C assisted in the entire case and his assistance was very crucial for facilitating every aspect of this case.     Lind Guest. Ninfa Linden, M.D.     CYB/MEDQ  D:  10/18/2014  T:  10/19/2014  Job:  [id number]

## 2014-10-20 LAB — CBC
HCT: 28.8 % — ABNORMAL LOW (ref 36.0–46.0)
Hemoglobin: 9.7 g/dL — ABNORMAL LOW (ref 12.0–15.0)
MCH: 29.9 pg (ref 26.0–34.0)
MCHC: 33.7 g/dL (ref 30.0–36.0)
MCV: 88.9 fL (ref 78.0–100.0)
Platelets: 146 10*3/uL — ABNORMAL LOW (ref 150–400)
RBC: 3.24 MIL/uL — ABNORMAL LOW (ref 3.87–5.11)
RDW: 13.8 % (ref 11.5–15.5)
WBC: 6.8 10*3/uL (ref 4.0–10.5)

## 2014-10-20 MED ORDER — VALACYCLOVIR HCL 500 MG PO TABS
1000.0000 mg | ORAL_TABLET | Freq: Three times a day (TID) | ORAL | Status: DC
  Administered 2014-10-20 – 2014-10-22 (×7): 1000 mg via ORAL
  Filled 2014-10-20 (×7): qty 2

## 2014-10-20 MED ORDER — CYCLOBENZAPRINE HCL 10 MG PO TABS
10.0000 mg | ORAL_TABLET | Freq: Three times a day (TID) | ORAL | Status: DC | PRN
  Administered 2014-10-20 – 2014-10-22 (×4): 10 mg via ORAL
  Filled 2014-10-20 (×4): qty 1

## 2014-10-20 MED ORDER — DOXYCYCLINE HYCLATE 100 MG PO TABS
100.0000 mg | ORAL_TABLET | Freq: Two times a day (BID) | ORAL | Status: DC
  Administered 2014-10-20 – 2014-10-22 (×5): 100 mg via ORAL
  Filled 2014-10-20 (×5): qty 1

## 2014-10-20 NOTE — Progress Notes (Signed)
Physical Therapy Treatment Patient Details Name: [name] MRN: [id number] DOB: 01/28/1946 Today's Date: 10/20/2014    History of Present Illness 69 y.o. female admitted for Recurrent dislocation of left anterior hip. s/p Exchange of left total hip arthroplasty ceramic hip ball.    PT Comments    Reviewed stair training techniques which patient tolerated well and performs safely. Advanced gait training with backwards stepping and side stepping. Ambulates over 500 feet without physical assist. Will continue to follow and progress until d/c.   Follow Up Recommendations  Home health PT;Supervision for mobility/OOB     Equipment Recommendations  None recommended by PT    Recommendations for Other Services       Precautions / Restrictions Precautions Precautions: None Restrictions Weight Bearing Restrictions: Yes LLE Weight Bearing: Weight bearing as tolerated    Mobility  Bed Mobility               General bed mobility comments: sitting in chair  Transfers Overall transfer level: Modified independent               General transfer comment: No assist from reclining chair  Ambulation/Gait Ambulation/Gait assistance: Supervision Ambulation Distance (Feet): 500 Feet Assistive device: Rolling walker (2 wheeled) Gait Pattern/deviations: Step-through pattern;Decreased stride length;Antalgic;Decreased step length - right;Decreased stance time - left;Trunk flexed Gait velocity: decreased   General Gait Details: Focused on upright posture, still mildy antalgic gait pattern. No loss of balance. Advance gait training with backwards stepping x 20 feet, and side stepping x10 feet to left and right. Pt more fatigued towards end of bout but reports feeling better overall from previous therapy sessions.   Stairs Stairs: Yes Stairs assistance: Min assist Stair Management: No rails;Step to pattern;Backwards;With walker;One rail Left;Sideways Number of Stairs:  2 General stair comments: Practiced alternative methods of stair navigation per pt request. Included side stepping with single rail at min guard level of assist for safety, and backwards stepping with RW for support, min assist to block RW. VC for sequencing.  Wheelchair Mobility    Modified Rankin (Stroke Patients Only)       Balance                                    Cognition Arousal/Alertness: Awake/alert Behavior During Therapy: WFL for tasks assessed/performed Overall Cognitive Status: Within Functional Limits for tasks assessed                      Exercises General Exercises - Lower Extremity Ankle Circles/Pumps: AROM;Both;10 reps;Seated Long Arc Quad: Strengthening;Both;10 reps;Seated Hip Flexion/Marching: Strengthening;Both;10 reps;Seated    General Comments        Pertinent Vitals/Pain Pain Assessment: No/denies pain Pain Intervention(s): Monitored during session;Repositioned    Home Living                      Prior Function            PT Goals (current goals can now be found in the care plan section) Acute Rehab PT Goals Patient Stated Goal: Go home PT Goal Formulation: With patient Time For Goal Achievement: 10/26/14 Potential to Achieve Goals: Good Progress towards PT goals: Progressing toward goals    Frequency  BID    PT Plan Current plan remains appropriate    Co-evaluation             End of Session  Activity Tolerance: Patient tolerated treatment well Patient left: in chair;with call bell/phone within reach     Time: 1622-1638 PT Time Calculation (min) (ACUTE ONLY): 16 min  Charges:  $Gait Training: 8-22 mins                    G Codes:      Ellouise Newer 28-Oct-2014, 5:04 PM Camille Bal [place], [place]

## 2014-10-20 NOTE — Progress Notes (Signed)
Physical Therapy Treatment Patient Details Name: [name] MRN: [id number] DOB: [birth year] Today's Date: 10/20/2014    History of Present Illness 69 y.o. female admitted for Recurrent dislocation of left anterior hip. s/p Exchange of left total hip arthroplasty ceramic hip ball.    PT Comments    Continues to make good progress towards physical therapy goals. Ambulating 525 feet with supervision for safety, no buckling of LEs or loss of balance noted. Tolerating therapeutic exercises well. Adequate for d/c from a mobility standpoint when medically ready. Will continue to progress pt's functional independence.  Follow Up Recommendations  Home health PT;Supervision for mobility/OOB     Equipment Recommendations  None recommended by PT    Recommendations for Other Services       Precautions / Restrictions Precautions Precautions: None Restrictions Weight Bearing Restrictions: Yes LLE Weight Bearing: Weight bearing as tolerated    Mobility  Bed Mobility               General bed mobility comments: sitting in chair  Transfers Overall transfer level: Needs assistance Equipment used: Rolling walker (2 wheeled) Transfers: Sit to/from Stand Sit to Stand: Supervision         General transfer comment: Supervision for safety. VC for hand placement. Slow to rise but good stability once holding RW for support.  Ambulation/Gait Ambulation/Gait assistance: Supervision Ambulation Distance (Feet): 525 Feet Assistive device: Rolling walker (2 wheeled) Gait Pattern/deviations: Step-through pattern;Trunk flexed Gait velocity: decreased   General Gait Details: VC for upright posture with forward gaze, to relax shoulders and grip on RW. No loss of balance during bout. Using RW minimally. Mild antalgic pattern noted, however good step through pattern sequence.   Stairs            Wheelchair Mobility    Modified Rankin (Stroke Patients Only)       Balance                                    Cognition Arousal/Alertness: Awake/alert Behavior During Therapy: WFL for tasks assessed/performed Overall Cognitive Status: Within Functional Limits for tasks assessed                      Exercises General Exercises - Lower Extremity Ankle Circles/Pumps: AROM;Both;10 reps;Seated Gluteal Sets: Strengthening;Both;10 reps;Seated Long Arc Quad: Strengthening;Both;10 reps;Seated    General Comments        Pertinent Vitals/Pain Pain Assessment: 0-10 Pain Score:  ("just sore" no value given) Pain Location: LLE Pain Descriptors / Indicators: Sore Pain Intervention(s): Monitored during session;Repositioned    Home Living                      Prior Function            PT Goals (current goals can now be found in the care plan section) Acute Rehab PT Goals Patient Stated Goal: Go home PT Goal Formulation: With patient Time For Goal Achievement: 10/26/14 Potential to Achieve Goals: Good Progress towards PT goals: Progressing toward goals    Frequency  BID    PT Plan Current plan remains appropriate    Co-evaluation             End of Session   Activity Tolerance: Patient tolerated treatment well Patient left: in chair;with call bell/phone within reach     Time: 1144-1159 PT Time Calculation (min) (ACUTE ONLY): 15 min  Charges:  $Gait Training: 8-22 mins                    G Codes:      Ellouise Newer 11-03-2014, 12:38 PM Camille Bal [place], [place]

## 2014-10-20 NOTE — Discharge Instructions (Signed)

## 2014-10-20 NOTE — Progress Notes (Signed)
Subjective: 2 Days Post-Op Procedure(s) (LRB): EXCHANGE LEFT HIP BALL OF DIRECT ANTERIOR TOTAL HIP ARTHROPLASTY (Left) Patient reports pain as moderate.   Working well with therapy.  H/H stable  Objective: Vital signs in last 24 hours: Temp:  [98 F (36.7 C)-100.4 F (38 C)] 98 F (36.7 C) (05/19 0627) Pulse Rate:  [101-115] 107 (05/19 0627) Resp:  [18] 18 (05/18 1400) BP: (126-145)/(45-59) 145/45 mmHg (05/19 0627) SpO2:  [93 %-98 %] 98 % (05/19 0627)  Intake/Output from previous day: 05/18 0701 - 05/19 0700 In: 1080 [P.O.:1080] Out: -  Intake/Output this shift:     Recent Labs  10/19/14 0445 10/20/14 0610  HGB 9.8* 9.7*    Recent Labs  10/19/14 0445 10/20/14 0610  WBC 8.2 6.8  RBC 3.35* 3.24*  HCT 30.1* 28.8*  PLT 232 146*    Recent Labs  10/19/14 0445  NA 133*  K 3.3*  CL 96*  CO2 29  BUN 8  CREATININE 0.64  GLUCOSE 137*  CALCIUM 8.2*    Recent Labs  10/18/14 0453  INR 1.46    Intact pulses distally Dorsiflexion/Plantar flexion intact Incision: scant drainage No cellulitis present Compartment soft  Assessment/Plan: 2 Days Post-Op Procedure(s) (LRB): EXCHANGE LEFT HIP BALL OF DIRECT ANTERIOR TOTAL HIP ARTHROPLASTY (Left) Up with therapy Discharge home with home health Saturday. D/C incisional vac and placed new dry dressing - will monitor drainage. Start oral antibiotic given adipose tissue, having has a second operation, and the seroma encountered.  Kordelia Severin Y 10/20/2014, 10:12 AM

## 2014-10-20 NOTE — Telephone Encounter (Signed)
After reviewing monitor, Dr. Debara Pickett would like patient to wear monitor for prescribed duration. He is understanding that she will have a break in service related to current hospitalization.   Patient has been taken off warfarin and is on asa 325mg  per orthopedist.   Patient will call us back when she contacts [employer] to resume monitoring service

## 2014-10-21 ENCOUNTER — Encounter (HOSPITAL_COMMUNITY): Payer: Self-pay | Admitting: Orthopaedic Surgery

## 2014-10-21 LAB — CBC
HCT: 29.6 % — ABNORMAL LOW (ref 36.0–46.0)
HEMOGLOBIN: 9.8 g/dL — AB (ref 12.0–15.0)
MCH: 29.4 pg (ref 26.0–34.0)
MCHC: 33.1 g/dL (ref 30.0–36.0)
MCV: 88.9 fL (ref 78.0–100.0)
PLATELETS: 248 10*3/uL (ref 150–400)
RBC: 3.33 MIL/uL — AB (ref 3.87–5.11)
RDW: 13.9 % (ref 11.5–15.5)
WBC: 5.7 10*3/uL (ref 4.0–10.5)

## 2014-10-21 MED ORDER — HYDROCODONE-ACETAMINOPHEN 5-325 MG PO TABS: 1.0000 | ORAL_TABLET | ORAL | Status: DC | PRN

## 2014-10-21 MED ORDER — ASPIRIN 325 MG PO TBEC: 325.0000 mg | DELAYED_RELEASE_TABLET | Freq: Every day | ORAL | Status: DC

## 2014-10-21 MED ORDER — CYCLOBENZAPRINE HCL 10 MG PO TABS: 10.0000 mg | ORAL_TABLET | Freq: Three times a day (TID) | ORAL | Status: DC | PRN

## 2014-10-21 MED ORDER — DOXYCYCLINE HYCLATE 100 MG PO TABS: 100.0000 mg | ORAL_TABLET | Freq: Two times a day (BID) | ORAL | Status: DC

## 2014-10-21 MED ORDER — VALACYCLOVIR HCL 1 G PO TABS: 1000.0000 mg | ORAL_TABLET | Freq: Three times a day (TID) | ORAL | Status: DC | PRN

## 2014-10-21 NOTE — Progress Notes (Signed)
Patient ID: [name], female   DOB: 1945/09/04, 69 y.o.   MRN: [id number] [name] well over all.  Vitals and labs stable.  Left hip dressing clean and dry.  Mobilizing well with therapy.  Will plan to discharge to home tomorrow 5/21 with home health PT.

## 2014-10-21 NOTE — Progress Notes (Signed)
Physical Therapy Treatment Patient Details Name: [name] MRN: [id number] DOB: Jun 18, 1945 Today's Date: 10/21/2014    History of Present Illness 69 y.o. female admitted for Recurrent dislocation of left anterior hip. s/p Exchange of left total hip arthroplasty ceramic hip ball.    PT Comments    Pt has met ambulation and transfer goals of mod. Indep. with the RW. Pt reports d/c is planned for tomorrow. Recommend ambulation on the unit with RW until d/c home and PT to reinforce strengthening exercise program.    Follow Up Recommendations  Home health PT     Equipment Recommendations  None recommended by PT    Recommendations for Other Services       Precautions / Restrictions Precautions Precautions: None Precaution Comments: Instructed to limit movements consistent with anterior hip precautions due to hx of dislocation. No orders set however. Restrictions Weight Bearing Restrictions: Yes LLE Weight Bearing: Weight bearing as tolerated    Mobility  Bed Mobility Overal bed mobility: Needs Assistance Bed Mobility: Supine to Sit     Supine to sit: Min assist;HOB elevated     General bed mobility comments: min assist with sliding L LE in the bed  Transfers Overall transfer level: Modified independent Equipment used: Rolling walker (2 wheeled) Transfers: Sit to/from Stand Sit to Stand: Modified independent (Device/Increase time)            Ambulation/Gait Ambulation/Gait assistance: Modified independent (Device/Increase time) Ambulation Distance (Feet): 500 Feet Assistive device: Rolling walker (2 wheeled) Gait Pattern/deviations: Step-through pattern;Decreased step length - right Gait velocity: decreased   General Gait Details: Pt is mod. Indep on the unit with RW   Stairs         General stair comments: verbally reviewed technique, pt declined pratice   Wheelchair Mobility    Modified Rankin (Stroke Patients Only)       Balance      Sitting balance-Leahy Scale: Normal       Standing balance-Leahy Scale: Good                      Cognition Arousal/Alertness: Awake/alert Behavior During Therapy: WFL for tasks assessed/performed Overall Cognitive Status: Within Functional Limits for tasks assessed                      Exercises Total Joint Exercises Ankle Circles/Pumps: AROM;Strengthening;Both;10 reps;Supine Quad Sets: AROM;Strengthening;Both;10 reps;Supine Heel Slides: AAROM;Strengthening;Left;10 reps;Supine Hip ABduction/ADduction: AAROM;Left;10 reps;Supine Long Arc Quad: AROM;Strengthening;Left;10 reps;Seated Marching in Standing: AROM;Strengthening;Both;10 reps;Standing    General Comments        Pertinent Vitals/Pain Pain Assessment: No/denies pain Pain Intervention(s): Monitored during session    Home Living                      Prior Function            PT Goals (current goals can now be found in the care plan section) Progress towards PT goals: Progressing toward goals    Frequency  BID    PT Plan Current plan remains appropriate    Co-evaluation             End of Session   Activity Tolerance: Patient tolerated treatment well Patient left: in chair;with call bell/phone within reach     Time: 1914-7829 PT Time Calculation (min) (ACUTE ONLY): 24 min  Charges:  $Gait Training: 8-22 mins $Therapeutic Exercise: 8-22 mins  G Codes:      Lelon Mast 10/21/2014, 11:39 AM

## 2014-10-21 NOTE — Progress Notes (Signed)
Physical Therapy Treatment Patient Details Name: [name] MRN: [id number] DOB: Nov 11, 1945 Today's Date: 10/21/2014    History of Present Illness 69 y.o. female admitted for Recurrent dislocation of left anterior hip. s/p Exchange of left total hip arthroplasty ceramic hip ball.    PT Comments    Pt is making excellent progress with mobility. Pt reports she will d/c home tomorrow to her son's house who will provide a couple of days of supervision. Pt is mod I with basic ambulation with RW and recommend HHPT. Pt does present with decreased L LE ROM and strength. Reviewed HHPT. Pt has met most mobility goals and PT will focus on LE strengthening.  Follow Up Recommendations  Home health PT;Supervision - Intermittent     Equipment Recommendations  None recommended by PT    Recommendations for Other Services       Precautions / Restrictions Precautions Precautions: None Precaution Comments: Instructed to limit movements consistent with anterior hip precautions due to hx of dislocation. No orders set however. Restrictions LLE Weight Bearing: Weight bearing as tolerated    Mobility  Bed Mobility Overal bed mobility: Needs Assistance Bed Mobility: Supine to Sit     Supine to sit: Min assist;HOB elevated     General bed mobility comments: min assist with sliding L LE in the bed  Transfers Overall transfer level: Modified independent Equipment used: Rolling walker (2 wheeled) Transfers: Sit to/from Stand Sit to Stand: Modified independent (Device/Increase time)            Ambulation/Gait Ambulation/Gait assistance: Modified independent (Device/Increase time) Ambulation Distance (Feet): 500 Feet Assistive device: Rolling walker (2 wheeled) Gait Pattern/deviations: Step-through pattern;Decreased stride length;Decreased step length - right Gait velocity: decreased       Stairs         General stair comments: verbally reviewed technique, pt declined pratice    Wheelchair Mobility    Modified Rankin (Stroke Patients Only)       Balance     Sitting balance-Leahy Scale: Good       Standing balance-Leahy Scale: Good                      Cognition Arousal/Alertness: Awake/alert Behavior During Therapy: WFL for tasks assessed/performed Overall Cognitive Status: Within Functional Limits for tasks assessed                      Exercises Total Joint Exercises Ankle Circles/Pumps: AROM;Both;20 reps;Supine Quad Sets: AROM;Strengthening;Both;20 reps;Supine Heel Slides: AAROM;Strengthening;Left;10 reps;Supine Hip ABduction/ADduction: AAROM;Strengthening;Left;10 reps;Supine Marching in Standing: AROM;Strengthening;Both;10 reps;Standing    General Comments        Pertinent Vitals/Pain Pain Assessment: No/denies pain Pain Intervention(s): Monitored during session    Home Living                      Prior Function            PT Goals (current goals can now be found in the care plan section) Progress towards PT goals: Progressing toward goals    Frequency  BID    PT Plan Current plan remains appropriate    Co-evaluation             End of Session   Activity Tolerance: Patient tolerated treatment well Patient left: in chair;with call bell/phone within reach     Time: 0820-0852 PT Time Calculation (min) (ACUTE ONLY): 32 min  Charges:  $Gait Training: 8-22 mins $Therapeutic Exercise: 8-22 mins  G Codes:      Lelon Mast 10/21/2014, 9:00 AM

## 2014-10-21 NOTE — Discharge Summary (Signed)
Patient ID: [name] MRN: [id number] DOB/AGE: [birth year] 69 y.o.  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 Arthritis   . Diverticulosis   . GERD (gastroesophageal reflux disease)   . Osteoporosis   . Tuberous sclerosis     EXTERNAL LESIONS--  MAINLY HANDS (INTERNAL SCLEROTIC BONY LESIONS LUMBAR & PELVIS PER CT 11/2013)  . Seasonal allergies   . H/O hiatal hernia   . History of diverticulitis of colon   . Nodule of right lung     BENIGN--- AND STABLE PER  CT  11/2013  . History of gastric ulcer   . History of epilepsy     childhood age 66 to 6  ---secondary to high calcium deposts of optic nerve---  none since age 68 with pregency  . IBS (irritable bowel syndrome)   . History of kidney stones   . History of shingles     MARCH 2014--  BACK AREA  . History of vulvar dysplasia     S/P  LASER ABLATION 2014  . Right ureteral stone   . Ureteral stenosis, right   . DDD (degenerative disc disease), lumbosacral   . Renal cyst, left     STABLE PER CT 11/2013  . PONV (postoperative nausea and vomiting)     occurred back in 1970's   . Anginal pain     hx of pt relates to stress last occurrence 8 to 10 years ago   . Tachycardia   . Mild obstructive sleep apnea     SLEEP STUDY  11-19-2012--  NO CPAP RX (DID NOT MEET [facility])  . Shortness of breath dyspnea     walking distances and climbing stairs  . Bronchitis     hx of   . Phlebitis     hx of     Surgeries: Procedure(s): EXCHANGE LEFT HIP BALL OF DIRECT ANTERIOR TOTAL HIP ARTHROPLASTY on 10/17/2014 - 10/18/2014   Consultants:    Discharged Condition: Improved  Hospital Course: [name] is an 69 y.o. female who was admitted 10/17/2014 for operative treatment ofRecurrent dislocation of  hip joint prosthesis. Patient has severe unremitting pain that affects sleep, daily activities, and work/hobbies. After pre-op clearance the patient was taken to the operating room on 10/17/2014 - 10/18/2014 and underwent  Procedure(s): EXCHANGE LEFT HIP BALL OF DIRECT ANTERIOR TOTAL HIP ARTHROPLASTY.    Patient was given perioperative antibiotics: Anti-infectives    Start     Dose/Rate Route Frequency Ordered Stop   10/21/14 0000  valACYclovir (VALTREX) 1000 MG tablet     1,000 mg Oral 3 times daily PRN 10/21/14 1721     10/21/14 0000  doxycycline (VIBRA-TABS) 100 MG tablet     100 mg Oral Every 12 hours 10/21/14 1721     10/20/14 1600  valACYclovir (VALTREX) tablet 1,000 mg     1,000 mg Oral 3 times daily 10/20/14 1012     10/20/14 1015  doxycycline (VIBRA-TABS) tablet 100 mg     100 mg Oral Every 12 hours 10/20/14 1005     10/19/14 1424  valACYclovir (VALTREX) tablet 1,000 mg  Status:  Discontinued     1,000 mg Oral 3 times daily PRN 10/19/14 1424 10/20/14 1012   10/18/14 2230  ceFAZolin (ANCEF) IVPB 2 g/50 mL premix  2 g 100 mL/hr over 30 Minutes Intravenous Every 6 hours 10/18/14 1958 10/19/14 0448       Patient was given sequential compression devices, early ambulation, and chemoprophylaxis to prevent DVT.  Patient benefited maximally from hospital stay and there were no complications.    Recent vital signs: Patient Vitals for the past 24 hrs:  BP Temp Temp src Pulse Resp SpO2  10/21/14 1300 (!) 118/55 mmHg 99.2 F (37.3 C) - (!) 111 18 99 %  10/21/14 0511 (!) 122/47 mmHg 98.2 F (36.8 C) Oral (!) 104 17 98 %  10/20/14 2030 (!) 136/53 mmHg 99.5 F (37.5 C) Oral (!) 118 19 100 %     Recent laboratory studies:  Recent Labs  10/19/14 0445 10/20/14 0610 10/21/14 0459  WBC 8.2 6.8 5.7  HGB 9.8* 9.7* 9.8*  HCT 30.1* 28.8* 29.6*  PLT 232 146* 248  NA 133*  --   --   K 3.3*  --   --   CL 96*  --   --   CO2 29  --   --   BUN 8  --   --   CREATININE 0.64  --   --    GLUCOSE 137*  --   --   CALCIUM 8.2*  --   --      Discharge Medications:     Medication List    STOP taking these medications        warfarin 7.5 MG tablet  Commonly known as:  COUMADIN      TAKE these medications        aspirin 325 MG EC tablet  Take 1 tablet (325 mg total) by mouth daily.     cholecalciferol 1000 UNITS tablet  Commonly known as:  VITAMIN D  Take 4,000 Units by mouth daily.     cyclobenzaprine 10 MG tablet  Commonly known as:  FLEXERIL  Take 1 tablet (10 mg total) by mouth 3 (three) times daily as needed for muscle spasms.     doxycycline 100 MG tablet  Commonly known as:  VIBRA-TABS  Take 1 tablet (100 mg total) by mouth every 12 (twelve) hours.     furosemide 20 MG tablet  Commonly known as:  LASIX  TAKE 1 TABLET BY MOUTH DAILY     HYDROcodone-acetaminophen 5-325 MG per tablet  Commonly known as:  NORCO/VICODIN  Take 1-2 tablets by mouth every 4 (four) hours as needed for moderate pain.     omeprazole 40 MG capsule  Commonly known as:  PRILOSEC  Take 40 mg by mouth daily.     valACYclovir 1000 MG tablet  Commonly known as:  VALTREX  Take 1 tablet (1,000 mg total) by mouth 3 (three) times daily as needed (for outbreaks).     Vitamin D (Ergocalciferol) 50000 UNITS Caps capsule  Commonly known as:  DRISDOL  Take 50,000 Units by mouth 2 (two) times a week. Takes on Sunday and Wednesday        Diagnostic Studies: Ct Pelvis Wo Contrast  10/17/2014   CLINICAL DATA:  Total hip replacement 4 weeks ago. Slipped and fell mesh shower.  EXAM: CT PELVIS WITHOUT CONTRAST  TECHNIQUE: Multidetector CT imaging of the pelvis was performed following the standard protocol without intravenous contrast.  COMPARISON:  None.  FINDINGS: There is a left total hip arthroplasty with beam hardening artifact partially obscuring the adjacent soft tissue and osseous structures. There is no dislocation. There is a mildly comminuted fracture of the  left inferior pubic  ramus -acetabular junction. There is no other fracture or dislocation. 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 There are mild degenerative changes of the pubic symphysis.  There is a 5.4 x 3.8 cm heterogeneous hyperdense fluid collection along the anterolateral aspect of the left greater trochanter consistent with a hematoma which is likely posttraumatic given recent fall. There is a small soft tissue hematoma in the subcutaneous fat overlying the left greater trochanter.  There are multiple small blastic lesions within the left ilium, sacrum and pubic rami of uncertain etiology. These are unchanged compared with 09/02/2003.  IMPRESSION: 1. Left total hip arthroplasty without dislocation. Acute, mildly comminuted fracture of the left inferior pubic ramus -acetabular junction. Posttraumatic hematoma overlying the left greater trochanter measuring 5.4 x 3.8 cm.   Electronically Signed   By: Kathreen Devoid   On: 10/17/2014 12:22   Dg Pelvis Portable  10/17/2014   CLINICAL DATA:  Postop left hip arthroplasty.  EXAM: PORTABLE PELVIS 1-2 VIEWS  COMPARISON:  Multiple prior exams dating back to April 2016.  FINDINGS: There is a single triangular-shaped piece of bone that lies along the medial margin of the left ischium, below the level of the acetabulum. This is not evident on prior studies. It is consistent with a fracture, likely from the medial margin of the ischium just below the medial acetabular wall. It is mildly displaced medially by 5 mm.  No other evidence a fracture. The prosthetic components are well-seated and aligned on the AP views acquired.  Stable arthropathic changes are noted of the right hip.  IMPRESSION: 1. Small fracture along the medial margin of the left ischium just below the level of the acetabulum, new since the prior studies. 2. Prosthetic components appear well seated and aligned.   Electronically Signed   By: Lajean Manes M.D.   On: 10/17/2014 08:52   Dg C-arm 1-60 Min-no  Report  10/16/2014   CLINICAL DATA:  Reduction of left hip replacement dislocation.  EXAM: DG C-ARM 1-60 MIN - NRPT [facility]; LEFT HIP (WITH PELVIS) 1 VIEW  COMPARISON:  10/16/2014  FINDINGS: A single intraoperative view of the left hip is submitted postoperatively for interpretation.  The femoral head prosthesis appears located in the acetabular component.  IMPRESSION: Relocated femoral head prosthesis of total hip replacement.   Electronically Signed   By: Margarette Canada M.D.   On: 10/16/2014 15:24   Dg Hip Unilat With Pelvis 1v Left  10/18/2014   CLINICAL DATA:  Status post revision of left hip arthroplasty.  EXAM: LEFT HIP (WITH PELVIS) 1 VIEW  COMPARISON:  Intraoperative imaging via a C-arm and additional imaging on 10/17/2014.  FINDINGS: Revised left hip arthroplasty shows normal alignment. Left-sided initial fracture again identified without significant displacement.  IMPRESSION: Normal alignment status post revision of left hip arthroplasty.   Electronically Signed   By: Aletta Edouard M.D.   On: 10/18/2014 18:54   Dg Hip Unilat With Pelvis 1v Left  10/16/2014   CLINICAL DATA:  Reduction of left hip replacement dislocation.  EXAM: DG C-ARM 1-60 MIN - [facility]; LEFT HIP (WITH PELVIS) 1 VIEW  COMPARISON:  10/16/2014  FINDINGS: A single intraoperative view of the left hip is submitted postoperatively for interpretation.  The femoral head prosthesis appears located in the acetabular component.  IMPRESSION: Relocated femoral head prosthesis of total hip replacement.   Electronically Signed   By: Margarette Canada M.D.   On: 10/16/2014 15:24   Dg Hip Operative  Unilat With Pelvis Left  10/18/2014   CLINICAL DATA:  Recent left hip dislocation. Revision of femoral component.  EXAM: OPERATIVE LEFT HIP 3 VIEW  TECHNIQUE: Fluoroscopic spot images were submitted for interpretation post-operatively.  COMPARISON:  Radiographs dated 10/17/2014  FINDINGS: Three C-arm images demonstrate the patient has undergone revision of  the left femoral component with a longer neck. Acetabular component appears unchanged. The left inferior pubic ramus fracture at the junction with the acetabulum is noted.  IMPRESSION: Revision of left total hip prosthesis as described.   Electronically Signed   By: Lorriane Shire M.D.   On: 10/18/2014 17:52   Dg Hip Unilat With Pelvis 2-3 Views Left  10/16/2014   CLINICAL DATA:  Left hip pain today. Left hip surgery 4 weeks ago. Initial encounter.  EXAM: LEFT HIP (WITH PELVIS) 2-3 VIEWS  COMPARISON:  09/16/2014  FINDINGS: Left total hip arthroplasty identified. There has been interval posterior medial dislocation of the femoral head component.  No acute fracture is identified.  Moderate -severe degenerative changes in the right hip noted.  IMPRESSION: Posterior medial dislocation of femoral head component of left total hip arthroplasty.   Electronically Signed   By: Margarette Canada M.D.   On: 10/16/2014 12:37    Disposition: 01-Home or Self Care        Follow-up Information    Follow up with Mcarthur Rossetti, MD. Schedule an appointment as soon as possible for a visit in 2 weeks.   Specialty:  Orthopedic Surgery   Contact information:   [place] [id number] [phone]        Signed: Mcarthur Rossetti 10/21/2014, 5:22 PM

## 2014-10-22 ENCOUNTER — Encounter (HOSPITAL_COMMUNITY): Payer: Self-pay | Admitting: *Deleted

## 2014-10-22 NOTE — Progress Notes (Signed)
Patient stable pain is controlled she's having a small amount of spasm in the left leg She feels like the left leg is slightly longer than the right Ankle dorsiflexion plantarflexion intact Plan is discharge today. I think she can work on heel lift height on return visit in 2 weeks

## 2014-10-22 NOTE — Progress Notes (Signed)
Physical Therapy Treatment Patient Details Name: [name] MRN: [id number] DOB: Oct 01, 1945 Today's Date: 10/22/2014    History of Present Illness 69 y.o. female admitted for Recurrent dislocation of left anterior hip. s/p Exchange of left total hip arthroplasty ceramic hip ball.    PT Comments    Pt to d/c home today with HHPT.  Follow Up Recommendations  Home health PT     Equipment Recommendations  None recommended by PT    Recommendations for Other Services       Precautions / Restrictions Precautions Precautions: None Restrictions Weight Bearing Restrictions: Yes LLE Weight Bearing: Weight bearing as tolerated    Mobility  Bed Mobility         Supine to sit: Min assist;HOB elevated     General bed mobility comments: min assist with sliding L LE in the bed  Transfers   Equipment used: Rolling walker (2 wheeled) Transfers: Stand Pivot Transfers Sit to Stand: Modified independent (Device/Increase time) Stand pivot transfers: Modified independent (Device/Increase time)          Ambulation/Gait Ambulation/Gait assistance: Modified independent (Device/Increase time) Ambulation Distance (Feet): 500 Feet Assistive device: Rolling walker (2 wheeled)   Gait velocity: decreased       Stairs            Wheelchair Mobility    Modified Rankin (Stroke Patients Only)       Balance                                    Cognition Arousal/Alertness: Awake/alert Behavior During Therapy: WFL for tasks assessed/performed Overall Cognitive Status: Within Functional Limits for tasks assessed                      Exercises Total Joint Exercises Ankle Circles/Pumps: AROM;Standing;20 reps Knee Flexion: AROM;Left;Standing;15 reps Marching in Standing: AROM;Both;15 reps    General Comments        Pertinent Vitals/Pain Pain Assessment: No/denies pain    Home Living                      Prior Function             PT Goals (current goals can now be found in the care plan section) Acute Rehab PT Goals Patient Stated Goal: Go home PT Goal Formulation: With patient Time For Goal Achievement: 10/26/14 Potential to Achieve Goals: Good Progress towards PT goals: Progressing toward goals    Frequency  BID    PT Plan Current plan remains appropriate    Co-evaluation             End of Session   Activity Tolerance: Patient tolerated treatment well Patient left: in chair;with call bell/phone within reach     Time: 0914-0938 PT Time Calculation (min) (ACUTE ONLY): 24 min  Charges:  $Gait Training: 8-22 mins $Therapeutic Exercise: 8-22 mins                    G Codes:      Lorriane Shire 10/22/2014, 9:44 AM

## 2014-10-24 ENCOUNTER — Telehealth: Payer: Self-pay | Admitting: Internal Medicine

## 2014-10-24 NOTE — Telephone Encounter (Signed)
Received records from [facility] for appointment on 11/04/14 with Dr Debara Pickett.  Records given to [facility] (medical records) for Dr Lysbeth Penner schedule on 11/04/14.  lp

## 2014-10-26 ENCOUNTER — Encounter: Payer: Self-pay | Admitting: Cardiovascular Disease

## 2014-10-27 ENCOUNTER — Encounter (HOSPITAL_COMMUNITY): Payer: Self-pay

## 2014-10-28 ENCOUNTER — Telehealth: Payer: Self-pay | Admitting: Internal Medicine

## 2014-10-28 NOTE — Telephone Encounter (Signed)
Spoke with patient. Appointment moved to July 25 to follow up after monitor and stress test. Patient states she is having stress related chest tightness and dizziness - has stress test on 6/7.

## 2014-10-28 NOTE — Telephone Encounter (Signed)
Ms. Baik is calling because he has not completed the wear of her monitor. She was to wear it for 14 days .Will she need to keep the appt aon 11/04/14 w/ Dr. Debara Pickett . Please call    Thanks

## 2014-10-28 NOTE — Telephone Encounter (Signed)
Patient will need to move appointment date.   Her stress test is scheduled AFTER her appointment. She should complete monitor and stress test and then follow up  LM for patient to call back to communicate this info

## 2014-11-03 ENCOUNTER — Telehealth (HOSPITAL_COMMUNITY): Payer: Self-pay

## 2014-11-03 DIAGNOSIS — M1612 Unilateral primary osteoarthritis, left hip: Secondary | ICD-10-CM | POA: Diagnosis not present

## 2014-11-03 DIAGNOSIS — Z96642 Presence of left artificial hip joint: Secondary | ICD-10-CM | POA: Diagnosis not present

## 2014-11-03 DIAGNOSIS — T84021D Dislocation of internal left hip prosthesis, subsequent encounter: Secondary | ICD-10-CM | POA: Diagnosis not present

## 2014-11-03 NOTE — Telephone Encounter (Signed)
Encounter complete. 

## 2014-11-04 ENCOUNTER — Ambulatory Visit: Payer: Self-pay | Admitting: Internal Medicine

## 2014-11-06 ENCOUNTER — Other Ambulatory Visit: Payer: Self-pay | Admitting: Family Medicine

## 2014-11-07 NOTE — Telephone Encounter (Signed)
Spoke with patient; patient has changed providers. Refill denied and patient is aware. RB

## 2014-11-08 ENCOUNTER — Encounter (HOSPITAL_COMMUNITY): Payer: Self-pay

## 2014-11-09 ENCOUNTER — Telehealth: Payer: Self-pay | Admitting: Internal Medicine

## 2014-11-09 NOTE — Telephone Encounter (Signed)
Pt called in wanting to speak with [place]. She says that she took the monitor to [employer] and they should receive it by Thursday. Please call her back she had a few questions she wanted to ask.   Thanks

## 2014-11-09 NOTE — Telephone Encounter (Signed)
Patient calling about having symptoms of heaviness in chest, dry heaving, extremely tired, and sleeping all day. Patient states " I feel life less".  Patient states these symptoms have been going on for a couple of weeks. At present time patient is not having any chest heaviness or pain. Went over patient's medication list. Patient is taking Norco three times a day. Patient stated she is not having any pain and still takes Norco. Informed patient to only take Norco for pain and if it was just mild pain to take Tylenol instead.  Informed patient that Norco could be making her weak, tired and sleepy. Asked patient about her recent cancellations for [facility], and patient stated she has no way to get to office. Since patient has no transportation, encouraged her to call 911 if she has any chest pain. Patient verbalized understanding. Patient also wanted Jenna to know she had turned in her monitor to [employer].  Will forward to Dr. Debara Pickett and his nurse so they are aware of patient's symptoms.

## 2014-11-09 NOTE — Telephone Encounter (Signed)
Patient is scheduled for follow up 12/26/14

## 2014-11-10 NOTE — Telephone Encounter (Signed)
Ok ... Thanks.  Dr. H 

## 2014-11-16 ENCOUNTER — Telehealth: Payer: Self-pay | Admitting: Internal Medicine

## 2014-11-16 NOTE — Telephone Encounter (Signed)
Spoke with patient regarding monitor results and she understands why Dr. Debara Pickett would like her to see Dr. Rayann Heman. She was notified that a staff message was sent to [facility] to contact patient to schedule her stress test.   This encounter will be routed to [facility]. Aida Puffer - Dr. Jackalyn Lombard scheduler to set up appointment with him for symptomatic PAF/AF vs ectopic atrial tachycardia

## 2014-11-16 NOTE — Telephone Encounter (Signed)
Pt is returning [name]'s call in reference her monitor and some medications she is taking and possibly setting her up for a stress test. Please call back   Thanks

## 2014-11-17 ENCOUNTER — Telehealth (HOSPITAL_COMMUNITY): Payer: Self-pay | Admitting: *Deleted

## 2014-11-17 NOTE — Telephone Encounter (Signed)
Can this encounter be closed?

## 2014-11-21 NOTE — Telephone Encounter (Signed)
Message routed to [facility] scheduling pool to contact patient for appointment with Dr. Rayann Heman

## 2014-11-29 ENCOUNTER — Telehealth (HOSPITAL_COMMUNITY): Payer: Self-pay

## 2014-11-29 NOTE — Telephone Encounter (Signed)
Encounter complete. 

## 2014-12-01 ENCOUNTER — Ambulatory Visit (HOSPITAL_COMMUNITY)
Admission: RE | Admit: 2014-12-01 | Discharge: 2014-12-01 | Disposition: A | Discharge status: Home / Self Care | Payer: Medicare Other | Source: Ambulatory Visit | Attending: Cardiology | Admitting: Cardiology

## 2014-12-01 DIAGNOSIS — R079 Chest pain, unspecified: Secondary | ICD-10-CM | POA: Diagnosis not present

## 2014-12-01 DIAGNOSIS — R9431 Abnormal electrocardiogram [ECG] [EKG]: Secondary | ICD-10-CM

## 2014-12-01 LAB — MYOCARDIAL PERFUSION IMAGING
CHL CUP NUCLEAR SRS: 4
CHL CUP RESTING HR STRESS: 109 {beats}/min
LV dias vol: 43 mL
LV sys vol: 12 mL
Peak HR: 129 {beats}/min
SDS: 0
SSS: 4
TID: 0.91

## 2014-12-01 MED ORDER — TECHNETIUM TC 99M SESTAMIBI GENERIC - CARDIOLITE
31.6000 | Freq: Once | INTRAVENOUS | Status: AC | PRN
  Administered 2014-12-01: 32 via INTRAVENOUS

## 2014-12-01 MED ORDER — REGADENOSON 0.4 MG/5ML IV SOLN
0.4000 mg | Freq: Once | INTRAVENOUS | Status: AC
  Administered 2014-12-01: 0.4 mg via INTRAVENOUS

## 2014-12-01 MED ORDER — AMINOPHYLLINE 25 MG/ML IV SOLN
75.0000 mg | Freq: Once | INTRAVENOUS | Status: AC
  Administered 2014-12-01: 75 mg via INTRAVENOUS

## 2014-12-01 MED ORDER — TECHNETIUM TC 99M SESTAMIBI GENERIC - CARDIOLITE
10.8000 | Freq: Once | INTRAVENOUS | Status: AC | PRN
  Administered 2014-12-01: 11 via INTRAVENOUS

## 2014-12-06 ENCOUNTER — Telehealth: Payer: Self-pay | Admitting: Internal Medicine

## 2014-12-06 DIAGNOSIS — N200 Calculus of kidney: Secondary | ICD-10-CM | POA: Diagnosis not present

## 2014-12-06 DIAGNOSIS — I4891 Unspecified atrial fibrillation: Secondary | ICD-10-CM | POA: Diagnosis not present

## 2014-12-06 DIAGNOSIS — R197 Diarrhea, unspecified: Secondary | ICD-10-CM | POA: Diagnosis not present

## 2014-12-06 DIAGNOSIS — M810 Age-related osteoporosis without current pathological fracture: Secondary | ICD-10-CM | POA: Diagnosis not present

## 2014-12-06 DIAGNOSIS — K219 Gastro-esophageal reflux disease without esophagitis: Secondary | ICD-10-CM | POA: Diagnosis not present

## 2014-12-06 DIAGNOSIS — E669 Obesity, unspecified: Secondary | ICD-10-CM | POA: Diagnosis not present

## 2014-12-06 DIAGNOSIS — Z6835 Body mass index (BMI) 35.0-35.9, adult: Secondary | ICD-10-CM | POA: Diagnosis not present

## 2014-12-06 MED ORDER — ASPIRIN 325 MG PO TBEC: 325.0000 mg | DELAYED_RELEASE_TABLET | Freq: Every day | ORAL | Status: DC

## 2014-12-06 NOTE — Telephone Encounter (Signed)
Rx(s) sent to pharmacy electronically.  

## 2014-12-06 NOTE — Telephone Encounter (Signed)
°  1. Which medications need to be refilled? Aspirin 325mg   2. Which pharmacy is medication to be sent to?[facility] on [address]   3. Do they need a 30 day or 90 day supply? 90  4. Would they like a call back once the medication has been sent to the pharmacy? Yes

## 2014-12-08 DIAGNOSIS — E669 Obesity, unspecified: Secondary | ICD-10-CM | POA: Diagnosis not present

## 2014-12-08 DIAGNOSIS — M1611 Unilateral primary osteoarthritis, right hip: Secondary | ICD-10-CM | POA: Diagnosis not present

## 2014-12-08 DIAGNOSIS — M25551 Pain in right hip: Secondary | ICD-10-CM | POA: Diagnosis not present

## 2014-12-08 DIAGNOSIS — M810 Age-related osteoporosis without current pathological fracture: Secondary | ICD-10-CM | POA: Diagnosis not present

## 2014-12-12 ENCOUNTER — Other Ambulatory Visit: Payer: Self-pay

## 2014-12-12 ENCOUNTER — Ambulatory Visit (INDEPENDENT_AMBULATORY_CARE_PROVIDER_SITE_OTHER): Payer: Medicare Other | Admitting: Internal Medicine

## 2014-12-12 ENCOUNTER — Encounter: Payer: Self-pay | Admitting: Internal Medicine

## 2014-12-12 VITALS — BP 110/70 | HR 87 | Ht 62.0 in | Wt 194.8 lb

## 2014-12-12 DIAGNOSIS — I480 Paroxysmal atrial fibrillation: Secondary | ICD-10-CM | POA: Diagnosis not present

## 2014-12-12 DIAGNOSIS — I471 Supraventricular tachycardia: Secondary | ICD-10-CM | POA: Diagnosis not present

## 2014-12-12 DIAGNOSIS — E669 Obesity, unspecified: Secondary | ICD-10-CM

## 2014-12-12 DIAGNOSIS — I491 Atrial premature depolarization: Secondary | ICD-10-CM

## 2014-12-12 DIAGNOSIS — I498 Other specified cardiac arrhythmias: Secondary | ICD-10-CM

## 2014-12-12 MED ORDER — DILTIAZEM HCL ER COATED BEADS 120 MG PO CP24
120.0000 mg | ORAL_CAPSULE | Freq: Every day | ORAL | Status: DC
Start: 2014-12-12 — End: 2015-01-23

## 2014-12-12 NOTE — Progress Notes (Signed)
Primary Care Physician: Reginia Naas, MD   [name] is a 69 y.o. female with a h/o "tachycardia" x 20 years,recently seen by Dr. Debara Pickett for irregular heart beat. She recently underwent hip replacement  In May and was also c/o chest pain. A stress test was negative for ischemia. She has had chest pain over the years with previous  stress tests negative. While in rehab from hip surgery, irregular heart beat was detected. Ekg showed atrial tachycardia. Cardionet showed mostly atrial tachycardia, with some suggestion of afib, but when reviewed with Dr. 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 She has a chadsvasc of 2 (Female, Age) but current asa should be sufficient. She complains of fatigue for several years, maybe worse with recent surgery. Has had two negative sleep studies in the past, but does snore. No alcohol/tobacco use. States her thyroid recently checked and ok.  Today, she denies symptoms of palpitations, chest pain, shortness of breath, orthopnea, PND, lower extremity edema, dizziness, presyncope, syncope, or neurologic sequela. The patient is tolerating medications without difficulties and is otherwise without complaint today.   Past Medical History  Diagnosis Date  . Arthritis   . Diverticulosis   . GERD (gastroesophageal reflux disease)   . Osteoporosis   . Tuberous sclerosis     EXTERNAL LESIONS--  MAINLY HANDS (INTERNAL SCLEROTIC BONY LESIONS LUMBAR & PELVIS PER CT 11/2013)  . Seasonal allergies   . H/O hiatal hernia   . History of diverticulitis of colon   . Nodule of right lung     BENIGN--- AND STABLE PER  CT  11/2013  . History of gastric ulcer   . History of epilepsy     childhood age 87 to 75  ---secondary to high calcium deposts of optic nerve---  none since age 67 with pregency  . IBS (irritable bowel syndrome)   . History of kidney stones   . History of shingles     MARCH 2014--  BACK AREA  . History of vulvar dysplasia     S/P  LASER  ABLATION 2014  . Right ureteral stone   . Ureteral stenosis, right   . DDD (degenerative disc disease), lumbosacral   . Renal cyst, left     STABLE PER CT 11/2013  . PONV (postoperative nausea and vomiting)     occurred back in 1970's   . Anginal pain     hx of pt relates to stress last occurrence 8 to 10 years ago   . Tachycardia   . Mild obstructive sleep apnea     SLEEP STUDY  11-19-2012--  NO CPAP RX (DID NOT MEET CRITIRIA)  . Shortness of breath dyspnea     walking distances and climbing stairs  . Bronchitis     hx of   . Phlebitis     hx of    Past Surgical History  Procedure Laterality Date  . Bladder surgery  1991    bladder reconstruction of torsion  . Colonoscopy    . Upper gastrointestinal endoscopy    . Cataract extraction w/ intraocular lens  implant, bilateral    . Cholecystectomy  11-18-2003  . Cardiac catheterization  06-26-2001    NORMAL CORONARY ARTERIES  . Orif right bimalleolar ankle fx  12-18-2003  . 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 Lumbar disc surgery  1985  . Vaginal hysterectomy  1975  . Hemorrhoid surgery  1970's  .  Co2 laser application N/A 0/02/3817    Procedure: CO2 LASER APPLICATION;  Surgeon: Terrance Mass, MD;  Location: [facility];  Service: Gynecology;  Laterality: N/A;CO2 laser of vaginal and vulvar lesions  . Gynecologic cryosurgery    . Dilation and curettage of uterus  multiple -- last one 1975  . Augmentation mammaplasty      SALINE  . Cystoscopy with retrograde pyelogram, ureteroscopy and stent placement Right 11/22/2013    Procedure: CYSTOSCOPY WITH RETROGRADE PYELOGRAM, URETEROSCOPY AND STENT PLACEMENT;  Surgeon: Sharyn Creamer, MD;  Location: [facility];  Service: Urology;  Laterality: Right;  . Cystoscopy/retrograde/ureteroscopy Right 12/01/2013    Procedure: CYSTOSCOPY, RIGHT RETROGRADE/RIGHT DIGITAL URETEROSCOPY, RIGHT URETERAL STENT  EXCHANGE;  Surgeon: Sharyn Creamer, MD;  Location: [facility];  Service: Urology;  Laterality: Right;  STAGED RIGHT URETEROSCOPY RIGHT URETER DILATION    . Holmium laser application Right 07/12/9369    Procedure: HOLMIUM LASER APPLICATION;  Surgeon: Sharyn Creamer, MD;  Location: [facility];  Service: Urology;  Laterality: Right;  . Total hip arthroplasty Left 09/16/2014    Procedure: LEFT TOTAL HIP ARTHROPLASTY ANTERIOR APPROACH;  Surgeon: Mcarthur Rossetti, MD;  Location: [facility];  Service: Orthopedics;  Laterality: Left;  . Hip closed reduction Left 10/16/2014    Procedure: CLOSED MANIPULATION HIP;  Surgeon: Mcarthur Rossetti, MD;  Location: [facility];  Service: Orthopedics;  Laterality: Left;  . Anterior hip revision Left 10/18/2014    Procedure: EXCHANGE LEFT HIP BALL OF DIRECT ANTERIOR TOTAL HIP ARTHROPLASTY;  Surgeon: Mcarthur Rossetti, MD;  Location: [facility];  Service: Orthopedics;  Laterality: Left;    Current Outpatient Prescriptions  Medication Sig Dispense Refill  . aspirin 325 MG EC tablet Take 1 tablet (325 mg total) by mouth daily. 90 tablet 0  . cholecalciferol (VITAMIN D) 1000 UNITS tablet Take 4,000 Units by mouth daily.     . furosemide (LASIX) 20 MG tablet TAKE 1 TABLET BY MOUTH DAILY 90 tablet 0  . omeprazole (PRILOSEC) 40 MG capsule Take 40 mg by mouth daily.    . valACYclovir (VALTREX) 1000 MG tablet Take 1 tablet (1,000 mg total) by mouth 3 (three) times daily as needed (for outbreaks). 20 tablet 0  . Vitamin D, Ergocalciferol, (DRISDOL) 50000 UNITS CAPS capsule Take 50,000 Units by mouth 2 (two) times a week. Takes on Sunday and Wednesday    . cyclobenzaprine (FLEXERIL) 10 MG tablet Take 1 tablet (10 mg total) by mouth 3 (three) times daily as needed for muscle spasms. (Patient not taking: Reported on 12/12/2014) 60 tablet 0  . diltiazem (CARDIZEM CD) 120 MG 24 hr capsule Take 1 capsule (120 mg total) by mouth daily. 30 capsule  6  . HYDROcodone-acetaminophen (NORCO/VICODIN) 5-325 MG per tablet Take 1-2 tablets by mouth every 4 (four) hours as needed for moderate pain. (Patient not taking: Reported on 12/12/2014) 60 tablet 0   No current facility-administered medications for this visit.    Allergies  Allergen Reactions  . Other     All anti-depressants pt states makes her feel as if her body is shutting down.  Marland Kitchen Zoloft [Sertraline Hcl]     Pt can not take SSRI - nausea and rash   . Percocet [Oxycodone-Acetaminophen] Nausea And Vomiting  . Contrast Media [Iodinated Diagnostic Agents] Rash    States was oral contrast- not betadine based IV contrast    History   Social History  . Marital Status: Single  Spouse Name: N/A  . Number of Children: N/A  . Years of Education: N/A   Occupational History  . driver Other   Social History Main Topics  . Smoking status: Former Smoker -- 2.00 packs/day for 20 years    Types: Cigarettes    Quit date: 06/03/1998  . Smokeless tobacco: Never Used  . Alcohol Use: No  . Drug Use: No  . Sexual Activity:    Partners: Male   Other Topics Concern  . Not on file   Social History Narrative   Exercise-no    Family History  Problem Relation Age of Onset  . Heart disease Mother   . Tuberous sclerosis Mother   . Tuberous sclerosis Sister   . Tuberous sclerosis Daughter   . Tuberous sclerosis Son   . Tuberous sclerosis Maternal Aunt   . Tuberous sclerosis Maternal Grandmother   . Tuberous sclerosis Maternal Aunt   . Tuberous sclerosis Maternal Aunt   . Tuberous sclerosis Cousin     ROS- All systems are reviewed and negative except as per the HPI above  Physical Exam: Filed Vitals:   12/12/14 1216  BP: 110/70  Pulse: 87  Height: 5\' 2"  (1.575 m)  Weight: 194 lb 12.8 oz (88.361 kg)    GEN- The patient is well appearing, alert and oriented x 3 today.   Head- normocephalic, atraumatic Eyes-  Sclera clear, conjunctiva pink Ears- hearing intact Oropharynx-  clear Neck- supple, no JVP Lymph- no cervical lymphadenopathy Lungs- Clear to ausculation bilaterally, normal work of breathing Heart- Regular rate and rhythm, no murmurs, rubs or gallops, PMI not laterally displaced GI- soft, NT, ND, + BS Extremities- no clubbing, cyanosis, or edema MS- no significant deformity or atrophy Skin- no rash or lesion Psych- euthymic mood, full affect Neuro- strength and sensation are intact  EKG-Reveals possible ectopic atrial tachycardia, rate 107 bpm. Cardionet monitor reviewed with evidence for atrial tachycardia Epic records reviewed   Assessment and Plan:  1.Atrial Tachycardia Start cardizem cd 120 mg a day Echo F/u in afib clinic for effectiveness of diltiazem/review of echo Consider flecainide as next line drug if needed Would defer ablation until medical options are first attempted  2. Obesity Encouraged to be as active as she can Weight loss advised but pt states never has been able to lose weight  3. Chadsvasc score of 2 Can continue with asa since primarily atach and no significant afib burden, per cardionet  F/u with Dr. Debara Pickett as scheduled

## 2014-12-12 NOTE — Progress Notes (Deleted)
Primary Care Physician: Reginia Naas, MD Referring Physician:   [name] is a 69 y.o. female with a h/o "tachycardia" x 20 years,recently seen by Dr. Debara Pickett for irregular heart beat. She recently underwent hip replacement  In May and was also c/o chest pain. A stress test was negative for ischemia. She has had chest pain over the years with previous  stress tests negative. While in rehab from hip surgery, irregular heart beat was detected. Ekg showed atrial tachycardia. Cardionet showed mostly atrial tachycardia, with some suggestion of afib, but when reviewed with Dr. 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 She has a chadsvasc of 2 (Female, Age) but current asa should be sufficient. She complains of fatigue for several years, maybe worse with recent surgery. Has had two negative sleep studies in the past, but does snore. No alcohol/tobacco use. States her thyroid recently checked and ok.  Today, she denies symptoms of palpitations, chest pain, shortness of breath, orthopnea, PND, lower extremity edema, dizziness, presyncope, syncope, or neurologic sequela. The patient is tolerating medications without difficulties and is otherwise without complaint today.   Past Medical History  Diagnosis Date  . Arthritis   . Diverticulosis   . GERD (gastroesophageal reflux disease)   . Osteoporosis   . Tuberous sclerosis     EXTERNAL LESIONS--  MAINLY HANDS (INTERNAL SCLEROTIC BONY LESIONS LUMBAR & PELVIS PER CT 11/2013)  . Seasonal allergies   . H/O hiatal hernia   . History of diverticulitis of colon   . Nodule of right lung     BENIGN--- AND STABLE PER  CT  11/2013  . History of gastric ulcer   . History of epilepsy     childhood age 80 to 63  ---secondary to high calcium deposts of optic nerve---  none since age 64 with pregency  . IBS (irritable bowel syndrome)   . History of kidney stones   . History of shingles     MARCH 2014--  BACK AREA  . History of vulvar  dysplasia     S/P  LASER ABLATION 2014  . Right ureteral stone   . Ureteral stenosis, right   . DDD (degenerative disc disease), lumbosacral   . Renal cyst, left     STABLE PER CT 11/2013  . PONV (postoperative nausea and vomiting)     occurred back in 1970's   . Anginal pain     hx of pt relates to stress last occurrence 8 to 10 years ago   . Tachycardia   . Mild obstructive sleep apnea     SLEEP STUDY  11-19-2012--  NO CPAP RX (DID NOT MEET CRITIRIA)  . Shortness of breath dyspnea     walking distances and climbing stairs  . Bronchitis     hx of   . Phlebitis     hx of    Past Surgical History  Procedure Laterality Date  . Bladder surgery  1991    bladder reconstruction of torsion  . Colonoscopy    . Upper gastrointestinal endoscopy    . Cataract extraction w/ intraocular lens  implant, bilateral    . Cholecystectomy  11-18-2003  . Cardiac catheterization  06-26-2001    NORMAL CORONARY ARTERIES  . Orif right bimalleolar ankle fx  12-18-2003  . 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 Lumbar disc surgery  1985  . Vaginal hysterectomy  1975  . Hemorrhoid surgery  1970's  .  Co2 laser application N/A 12/09/5883    Procedure: CO2 LASER APPLICATION;  Surgeon: Terrance Mass, MD;  Location: [facility];  Service: Gynecology;  Laterality: N/A;CO2 laser of vaginal and vulvar lesions  . Gynecologic cryosurgery    . Dilation and curettage of uterus  multiple -- last one 1975  . Augmentation mammaplasty      SALINE  . Cystoscopy with retrograde pyelogram, ureteroscopy and stent placement Right 11/22/2013    Procedure: CYSTOSCOPY WITH RETROGRADE PYELOGRAM, URETEROSCOPY AND STENT PLACEMENT;  Surgeon: Sharyn Creamer, MD;  Location: [facility];  Service: Urology;  Laterality: Right;  . Cystoscopy/retrograde/ureteroscopy Right 12/01/2013    Procedure: CYSTOSCOPY, RIGHT RETROGRADE/RIGHT DIGITAL URETEROSCOPY,  RIGHT URETERAL STENT EXCHANGE;  Surgeon: Sharyn Creamer, MD;  Location: [facility];  Service: Urology;  Laterality: Right;  STAGED RIGHT URETEROSCOPY RIGHT URETER DILATION    . Holmium laser application Right 0/07/7739    Procedure: HOLMIUM LASER APPLICATION;  Surgeon: Sharyn Creamer, MD;  Location: [facility];  Service: Urology;  Laterality: Right;  . Total hip arthroplasty Left 09/16/2014    Procedure: LEFT TOTAL HIP ARTHROPLASTY ANTERIOR APPROACH;  Surgeon: Mcarthur Rossetti, MD;  Location: [facility];  Service: Orthopedics;  Laterality: Left;  . Hip closed reduction Left 10/16/2014    Procedure: CLOSED MANIPULATION HIP;  Surgeon: Mcarthur Rossetti, MD;  Location: [facility];  Service: Orthopedics;  Laterality: Left;  . Anterior hip revision Left 10/18/2014    Procedure: EXCHANGE LEFT HIP BALL OF DIRECT ANTERIOR TOTAL HIP ARTHROPLASTY;  Surgeon: Mcarthur Rossetti, MD;  Location: [facility];  Service: Orthopedics;  Laterality: Left;    Current Outpatient Prescriptions  Medication Sig Dispense Refill  . aspirin 325 MG EC tablet Take 1 tablet (325 mg total) by mouth daily. 90 tablet 0  . cholecalciferol (VITAMIN D) 1000 UNITS tablet Take 4,000 Units by mouth daily.     . furosemide (LASIX) 20 MG tablet TAKE 1 TABLET BY MOUTH DAILY 90 tablet 0  . omeprazole (PRILOSEC) 40 MG capsule Take 40 mg by mouth daily.    . valACYclovir (VALTREX) 1000 MG tablet Take 1 tablet (1,000 mg total) by mouth 3 (three) times daily as needed (for outbreaks). 20 tablet 0  . Vitamin D, Ergocalciferol, (DRISDOL) 50000 UNITS CAPS capsule Take 50,000 Units by mouth 2 (two) times a week. Takes on Sunday and Wednesday    . cyclobenzaprine (FLEXERIL) 10 MG tablet Take 1 tablet (10 mg total) by mouth 3 (three) times daily as needed for muscle spasms. (Patient not taking: Reported on 12/12/2014) 60 tablet 0  . diltiazem (CARDIZEM CD) 120 MG 24 hr capsule Take 1 capsule (120 mg total) by  mouth daily. 30 capsule 6  . HYDROcodone-acetaminophen (NORCO/VICODIN) 5-325 MG per tablet Take 1-2 tablets by mouth every 4 (four) hours as needed for moderate pain. (Patient not taking: Reported on 12/12/2014) 60 tablet 0   No current facility-administered medications for this visit.    Allergies  Allergen Reactions  . Other     All anti-depressants pt states makes her feel as if her body is shutting down.  Marland Kitchen Zoloft [Sertraline Hcl]     Pt can not take SSRI - nausea and rash   . Percocet [Oxycodone-Acetaminophen] Nausea And Vomiting  . Contrast Media [Iodinated Diagnostic Agents] Rash    States was oral contrast- not betadine based IV contrast    History   Social History  . Marital Status: Single  Spouse Name: N/A  . Number of Children: N/A  . Years of Education: N/A   Occupational History  . driver Other   Social History Main Topics  . Smoking status: Former Smoker -- 2.00 packs/day for 20 years    Types: Cigarettes    Quit date: 06/03/1998  . Smokeless tobacco: Never Used  . Alcohol Use: No  . Drug Use: No  . Sexual Activity:    Partners: Male   Other Topics Concern  . Not on file   Social History Narrative   Exercise-no    Family History  Problem Relation Age of Onset  . Heart disease Mother   . Tuberous sclerosis Mother   . Tuberous sclerosis Sister   . Tuberous sclerosis Daughter   . Tuberous sclerosis Son   . Tuberous sclerosis Maternal Aunt   . Tuberous sclerosis Maternal Grandmother   . Tuberous sclerosis Maternal Aunt   . Tuberous sclerosis Maternal Aunt   . Tuberous sclerosis Cousin     ROS- All systems are reviewed and negative except as per the HPI above  Physical Exam: Filed Vitals:   12/12/14 1216  BP: 110/70  Pulse: 87  Height: 5\' 2"  (1.575 m)  Weight: 194 lb 12.8 oz (88.361 kg)    GEN- The patient is well appearing, alert and oriented x 3 today.   Head- normocephalic, atraumatic Eyes-  Sclera clear, conjunctiva pink Ears-  hearing intact Oropharynx- clear Neck- supple, no JVP Lymph- no cervical lymphadenopathy Lungs- Clear to ausculation bilaterally, normal work of breathing Heart- Regular rate and rhythm, no murmurs, rubs or gallops, PMI not laterally displaced GI- soft, NT, ND, + BS Extremities- no clubbing, cyanosis, or edema MS- no significant deformity or atrophy Skin- no rash or lesion Psych- euthymic mood, full affect Neuro- strength and sensation are intact  EKG-Reveals possible ectopic atrial tachycardia, rate 107 bpm. Cardionet monitor reviewed with evidence for atrial tachycardia Epic records reviewed   Assessment and Plan:  1.Atrial Tachycardia Start cardizem cd 120 mg a day Echo F/u in afib clinic for effectiveness of diltiazem/review of echo Consider flecainide as next line drug if needed  2. Obesity Encouraged to be as active as she can Weight loss advised but pt states never has been able to lose weight  3. Chadsvasc score of 2 Can continue with asa since primarily atach and no significant afib burden, per cardionet  F/u with Dr. Debara Pickett as scheduled

## 2014-12-12 NOTE — Patient Instructions (Signed)
Medication Instructions: - Start Diltiazem 120 mg once daily  Labwork: - none  Procedures/Testing: - Your physician has requested that you have an echocardiogram. Echocardiography is a painless test that uses sound waves to create images of your heart. It provides your doctor with information about the size and shape of your heart and how well your heart's chambers and valves are working. This procedure takes approximately one hour. There are no restrictions for this procedure.  Follow-Up: - Your physician recommends that you schedule a follow-up appointment in: 6 weeks with Roderic Palau, NP in the A-Fib Clinic.  Any Additional Special Instructions Will Be Listed Below (If Applicable). - none

## 2014-12-16 ENCOUNTER — Encounter: Payer: Self-pay | Admitting: Internal Medicine

## 2014-12-16 ENCOUNTER — Telehealth: Payer: Self-pay | Admitting: Internal Medicine

## 2014-12-19 ENCOUNTER — Other Ambulatory Visit: Payer: Self-pay

## 2014-12-19 ENCOUNTER — Ambulatory Visit (HOSPITAL_COMMUNITY): Payer: Medicare Other | Attending: Cardiology

## 2014-12-19 DIAGNOSIS — I480 Paroxysmal atrial fibrillation: Secondary | ICD-10-CM | POA: Diagnosis not present

## 2014-12-19 NOTE — Telephone Encounter (Signed)
Close encounter 

## 2014-12-20 DIAGNOSIS — M810 Age-related osteoporosis without current pathological fracture: Secondary | ICD-10-CM | POA: Diagnosis not present

## 2014-12-26 ENCOUNTER — Ambulatory Visit: Payer: Medicare Other

## 2014-12-26 ENCOUNTER — Telehealth: Payer: Self-pay | Admitting: Internal Medicine

## 2014-12-26 ENCOUNTER — Ambulatory Visit: Payer: Self-pay | Admitting: Internal Medicine

## 2014-12-26 NOTE — Telephone Encounter (Signed)
Received records from [place] for appointment on 02/13/15 with Dr Debara Pickett.  Records given to [facility] (medical records) for Dr Lysbeth Penner schedule on 02/13/15. lp

## 2015-01-03 ENCOUNTER — Ambulatory Visit: Payer: Medicare Other | Attending: Internal Medicine

## 2015-01-03 DIAGNOSIS — M810 Age-related osteoporosis without current pathological fracture: Secondary | ICD-10-CM | POA: Insufficient documentation

## 2015-01-03 DIAGNOSIS — M6281 Muscle weakness (generalized): Secondary | ICD-10-CM | POA: Diagnosis not present

## 2015-01-03 DIAGNOSIS — R29898 Other symptoms and signs involving the musculoskeletal system: Secondary | ICD-10-CM

## 2015-01-03 NOTE — Therapy (Addendum)
[facility] [address] [place], [place], [address] Phone: [phone]   Fax:  [phone]  Physical Therapy Evaluation  Patient Details  Name: [name] MRN: [id number] Date of Birth: 04-Oct-1945 Referring Provider:  Delrae Rend, MD  Encounter Date: 01/03/2015      PT End of Session - 01/03/15 0955    Visit Number 1   Number of Visits 10   Date for PT Re-Evaluation 01/31/15   PT Start Time 0913   PT Stop Time 0955   PT Time Calculation (min) 42 min   Activity Tolerance Patient tolerated treatment well   Behavior During Therapy [facility] for tasks assessed/performed      Past Medical History  Diagnosis Date  . Arthritis   . Diverticulosis   . GERD (gastroesophageal reflux disease)   . Osteoporosis   . Tuberous sclerosis     EXTERNAL LESIONS--  MAINLY HANDS (INTERNAL SCLEROTIC BONY LESIONS LUMBAR & PELVIS PER CT 11/2013)  . Seasonal allergies   . H/O hiatal hernia   . History of diverticulitis of colon   . Nodule of right lung     BENIGN--- AND STABLE PER  CT  11/2013  . History of gastric ulcer   . History of epilepsy     childhood age 56 to 33  ---secondary to high calcium deposts of optic nerve---  none since age 50 with pregency  . IBS (irritable bowel syndrome)   . History of kidney stones   . History of shingles     MARCH 2014--  BACK AREA  . History of vulvar dysplasia     S/P  LASER ABLATION 2014  . Right ureteral stone   . Ureteral stenosis, right   . DDD (degenerative disc disease), lumbosacral   . Renal cyst, left     STABLE PER CT 11/2013  . PONV (postoperative nausea and vomiting)     occurred back in 1970's   . Anginal pain     hx of pt relates to stress last occurrence 8 to 10 years ago   . Tachycardia   . Mild obstructive sleep apnea     SLEEP STUDY  11-19-2012--  NO CPAP RX (DID NOT MEET CRITIRIA)  . Shortness of breath dyspnea     walking distances and climbing stairs  .  Bronchitis     hx of   . Phlebitis     hx of     Past Surgical History  Procedure Laterality Date  . Bladder surgery  1991    bladder reconstruction of torsion  . Colonoscopy    . Upper gastrointestinal endoscopy    . Cataract extraction w/ intraocular lens  implant, bilateral    . Cholecystectomy  11-18-2003  . Cardiac catheterization  06-26-2001    NORMAL CORONARY ARTERIES  . Orif right bimalleolar ankle fx  12-18-2003  . 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 Lumbar disc surgery  1985  . Vaginal hysterectomy  1975  . Hemorrhoid surgery  1970's  . Co2 laser application N/A 11/04/1581    Procedure: CO2 LASER APPLICATION;  Surgeon: Terrance Mass, MD;  Location: [facility];  Service: Gynecology;  Laterality: N/A;CO2 laser of vaginal and vulvar lesions  . Gynecologic cryosurgery    . Dilation and curettage of uterus  multiple -- last one 1975  . Augmentation mammaplasty      SALINE  .  Cystoscopy with retrograde pyelogram, ureteroscopy and stent placement Right 11/22/2013    Procedure: CYSTOSCOPY WITH RETROGRADE PYELOGRAM, URETEROSCOPY AND STENT PLACEMENT;  Surgeon: Sharyn Creamer, MD;  Location: [facility];  Service: Urology;  Laterality: Right;  . Cystoscopy/retrograde/ureteroscopy Right 12/01/2013    Procedure: CYSTOSCOPY, RIGHT RETROGRADE/RIGHT DIGITAL URETEROSCOPY, RIGHT URETERAL STENT EXCHANGE;  Surgeon: Sharyn Creamer, MD;  Location: [facility];  Service: Urology;  Laterality: Right;  STAGED RIGHT URETEROSCOPY RIGHT URETER DILATION    . Holmium laser application Right 01/04/3824    Procedure: HOLMIUM LASER APPLICATION;  Surgeon: Sharyn Creamer, MD;  Location: [facility];  Service: Urology;  Laterality: Right;  . Total hip arthroplasty Left 09/16/2014    Procedure: LEFT TOTAL HIP ARTHROPLASTY ANTERIOR APPROACH;  Surgeon: Mcarthur Rossetti, MD;  Location:  [facility];  Service: Orthopedics;  Laterality: Left;  . Hip closed reduction Left 10/16/2014    Procedure: CLOSED MANIPULATION HIP;  Surgeon: Mcarthur Rossetti, MD;  Location: [facility];  Service: Orthopedics;  Laterality: Left;  . Anterior hip revision Left 10/18/2014    Procedure: EXCHANGE LEFT HIP BALL OF DIRECT ANTERIOR TOTAL HIP ARTHROPLASTY;  Surgeon: Mcarthur Rossetti, MD;  Location: [place] [facility];  Service: Orthopedics;  Laterality: Left;    There were no vitals filed for this visit.  Visit Diagnosis:  Osteoporosis - Plan: PT plan of care cert/re-cert  Weakness of back - Plan: PT plan of care cert/re-cert      Subjective Assessment - 01/03/15 0915    Subjective Pt presents to PT with osteoporosis for education regarding osteoporosis, body mechanics and HEP.     Pertinent History t-score -2.6, Lt ankle fracture 2005, Rt ankle fracture 2014 with ORIF, rib and tailbone fracture, Lt THA 09/2014 with revision 2 weeks later   Diagnostic tests bone denisity scan   Patient Stated Goals learn dos and don'ts of osteoporosis   Currently in Pain? No/denies  no pain related to osteoporosis            [facility] PT Assessment - 01/03/15 0001    Assessment   Medical Diagnosis osteoporosis (M81.0)  t-score -2.6   Onset Date/Surgical Date 01/03/04   Next MD Visit 6 months   Prior Therapy none   Precautions   Precautions Other (comment)  osteoporosis t-score -2.6   Precaution Comments No lower extremity exercise due to Lt hip   Restrictions   Weight Bearing Restrictions No   Balance Screen   Has the patient fallen in the past 6 months No   Has the patient had a decrease in activity level because of a fear of falling?  No   Is the patient reluctant to leave their home because of a fear of falling?  No   Home Environment   Living Environment Private residence   Type of [place] to enter   Entrance Stairs-Number of Steps 3   [place] One level   Prior Function    Level of Independence Independent   Vocation Part time employment   Contractor for [employer]   Overall Cognitive Status Within Functional Limits for tasks assessed   Posture/Postural Control   Posture/Postural Control Postural limitations   Postural Limitations Rounded Shoulders;Forward head;Flexed trunk   ROM / Strength   AROM / PROM / Strength AROM;Strength   AROM   Overall AROM  Within functional limits for tasks performed   Strength   Overall Strength  Deficits   Overall Strength Comments UE strength 4+/5 throughout, Rt LE 4/5 hip, 4+/5 knee.   Palpation   Palpation comment palpable tenderness over bilateral rhomboids, UT and thoracic paraspinals                           PT Education - January 27, 2015 1005    Education provided Yes   Education Details osteoporosis education   Person(s) Educated Patient   Methods Explanation;Handout   Comprehension Verbalized understanding             PT Long Term Goals - January 27, 2015 0958    PT LONG TERM GOAL #1   Title verbally understand ways to manage osteoporosis with diet and correct to reduce strain on spine   Time 4   Period Weeks   Status New   PT LONG TERM GOAL #2   Title verbally understand correct body mechanics for home and work tasks to reduce strain on spine   Time 4   Period Weeks   Status New   PT LONG TERM GOAL #3   Title verbally understand ways to strengthen posutral musculature   Time 4   Period Weeks   Status New   PT LONG TERM GOAL #4   Title iniitate HEP focusing on managing osteoporosis postural deficits   Time 4   Period Weeks   Status New   PT LONG TERM GOAL #5   Title verbally understand the dos and don'ts of osteoporosis management   Time 4   Period Weeks   Status New               Plan - 01/27/2015 1610    Clinical Impression Statement Pt presents to PT with history of ostoporosis and t-score -2.6.  Pt with a history of fractures to ankles,  ribs and tailbone.  Pt will benefit from PT for education regarding osteoporosis, body mechanics, and exercises to protect spine from fractures.     Pt will benefit from skilled therapeutic intervention in order to improve on the following deficits Decreased safety awareness;Decreased knowledge of precautions;Decreased strength   Rehab Potential Good   PT Frequency 1x / week   PT Duration 4 weeks   PT Treatment/Interventions ADLs/Self Care Home Management;Cryotherapy;Electrical Stimulation;Moist Heat;Functional mobility training;Patient/family education;Neuromuscular re-education;Therapeutic exercise;Therapeutic activities   PT Next Visit Plan HEP for postural strength: theraband for scapular strength.  Practice lifting and other body mechanics modifications needed to reduce fracture   Consulted and Agree with Plan of Care Patient          G-Codes - 27-Jan-2015 0929    Functional Assessment Tool Used Clinical Judgement   Functional Limitation Other PT primary   Other PT Primary Current Status (R6045) At least 1 percent but less than 20 percent impaired, limited or restricted   Other PT Primary Goal Status (W0981) At least 1 percent but less than 20 percent impaired, limited or restricted       Problem List Patient Active Problem List   Diagnosis Date Noted  . Atrial tachycardia 12/12/2014  . 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 Recurrent dislocation of hip 10/17/2014  . Dislocation of internal left hip prosthesis 10/16/2014  . Ectopic atrial rhythm 10/12/2014  . Atrial bigeminy 10/12/2014  . Osteoarthritis of left hip 09/16/2014  . Status post total replacement of left hip 09/16/2014  . ASCUS favoring benign 09/05/2014  . History of vitamin B deficiency 05/02/2014  . Acute  bronchitis 04/18/2014  . Oral aphthous ulcer 04/18/2014  . Edema 12/21/2013  . Irregular heart beat 12/21/2013  . Anuria 11/12/2013  . History of cervical dysplasia 09/21/2013  .  Rectocele, female 09/21/2013  . Kidney stone 09/21/2013  . Rash and nonspecific skin eruption 06/25/2013  . Obesity (BMI 30-39.9) 06/16/2013  . IBS (irritable bowel syndrome) 12/22/2012  . Other sleep disturbances 10/27/2012  . Elevated BP 09/29/2012  . Weight gain 08/17/2012  . Osteopenia 08/17/2012  . Vulvar lesion 08/10/2012  . Tuberous sclerosis 26-Jul-2012  . Condyloma acuminatum in female 2012/07/26  . Shingles 07/26/2012  . Vitamin D deficiency 07/26/12   G-codes: CI all categories, Other PT primary Dionta Larke, PT 01/03/2015, 10:06 AM PHYSICAL THERAPY DISCHARGE SUMMARY  Visits from Start of Care: 1  Current functional level related to goals / functional outcomes: See above.  Pt didn't return to PT.   Remaining deficits: See above for most recent status.     Education / Equipment: HEP, osteoporosis education Plan: Patient agrees to discharge.  Patient goals were not met. Patient is being discharged due to not returning since the last visit.  ?????   Sigurd Sos, PT 04/17/2015 4:19 PM  [facility] [address] [place], [place], [address] Phone: [phone]   Fax:  [phone]

## 2015-01-03 NOTE — Patient Instructions (Signed)
DO's and DON'T's   Avoid and/or Minimize positions of forward bending ( flexion)  Side bending and rotation of the trunk  Especially when movements occur together   When your back aches:   Don't sit down   Lie down on your back with a small pillow under your head and one under your knees or as outlined by our therapist. Or, lie in the 90/90 position ( on the floor with your feet and legs on the sofa with knees and hips bent to 90 degrees)  Tying or putting on your shoes:   Don't bend over to tie your shoes or put on socks.  Instead, bring one foot up, cross it over the opposite knee and bend forward (hinge) at the hips to so the task.  Keep your back straight.  If you cannot do this safely, then you need to use long handled assistive devices such as a shoehorn and sock puller.  Exercising:  Don't engage in ballistic types of exercise routines such as high-impact aerobics or jumping rope  Don't do exercises in the gym that bring you forward (abdominal crunches, sit-ups, touching your  toes, knee-to-chest, straight leg raising.)  Follow a regular exercise program that includes a variety of different weight-bearing activities, such as low-impact aerobics, T' ai chi or walking as your physical therapist advises  Do exercises that emphasize return to normal body alignment and strengthening of the muscles that keep your back straight, as outlined in this program or by your therapist  Household tasks:  Don't reach unnecessarily or twist your trunk when mopping, sweeping, vacuuming, raking, making beds, weeding gardens, getting objects ou of cupboards, etc.  Keep your broom, mop, vacuum, or rake close to you and mover your whole body as you move them. Walk over to the area on which you are working. Arrange kitchen, bathroom, and bedroom shelves so that frequently used items may be reached without excessive bending, twisting, and reaching.  Use a  sturdy stool if necessary.  Don't bend from the waist to pick up something up  Off the floor, out of the trunk of your car, or to brush your teeth, wash your face, etc.   Bend at the knees, keeping back straight as possible. Use a reacher if necessary.   Prevention of fracture is the so-called "BOTTOm -Line" in the management of OSTEOPOROSIS. Do not take unnecessary chances in movement. Once a compression fracture occurs, the process is very difficult to control; one fracture is frequently followed by many more.     Osteoporosis   What is Osteoporosis?  - A silent disease in which the skeleton is weakened by decreased bone density. - Characterized by low bone mass, deterioration of bone, and increased risk of fracture postmenopausal (primary) or the result of an identifiable condition/event (secondary) - Commonly found in the wrists, spine, and hips; these are high-risk stress areas and very susceptible to fractures.  The Facts: - There are 1.5 million fractures/year o 500,000 spine; 250,000 hip with over 60,000 nursing home admissions secondary to hip fracture; and 200,000 wrist - After hip fracture, only 50% of people able to walk independently prior to the fracture return to independent ambulation. - Bone mass: Peaks at age 20-30, and begins declining at age 40-50.   Osteoporosis is defined by the World Health Organization (WHO) as:  NOF/WHO Criteria for Interpreting Results of Bone Density Assessment  Results Diagnosis  Within 1 standard deviation (SD) of young adult mean Normal  Between 1 and -2.5   SD below mean, repeat in 2 years Low bone mass (osteopenia)  Greater than -2.5 SD below mean Osteoporosis  Greater than -2.5 SD below mean and one or more fragility fractures exist Severe Osteoporosis  *Results can be affected by positioning of the body in the DEXA scan, presence of current or old fractures, arthritis, extraneous calcifications.    Osteoporosis is not just a  women's disease!  - 30-40% of women will develop osteoporosis - 5-15% of males will develop osteoporosis   What are the risk factors?  1. Female 2. Thin, small frame 3. Caucasian, Asian race 4. Early menopause (<45 years old)/amenorrhea/delayed puberty 5. Old age 6. Family history (fractures, stooped posture)\ 7. Low calcium diet 8. Sedentary lifestyle 9. Alcohol, Caffeine, Smoking 10. Malnutrition, GI Disease 11. Prolonged use of Glucocorticoids (Prednisone), Meds to treat asthma, arthritis, cancers, thyroid, and anti-seizure meds.  How do I know for sure?  Get a BONE DENSITY TEST!  This measures bone loss and it's painless, non-invasive, and only takes 5-10 minutes!  What can I do about it?  ? Decrease your risk factors (alcohol, caffeine, smoking) ? Helpful medications (see next page) ? Adequate Calcium and Vitamin D intake ? Get active! o Proper posture - Sit and stand tall! No slouching or twisting o Weight-Bearing Exercise - walking, stair climbing, elliptical; NO jogging or high-impact exercise. o Resistive Exercise - Cybex weight equipment, Nautilus, dumbbells, therabands  **Be sure to maintain proper alignment when lifting any weight!!  **When using equipment, avoid abdominal exercises which involve "crunching" or curling or twisting the trunk, biceps machines, cross-country machines, moving handlebars, or ANY MACHINE WITH ROTATION OR FORWARD BENDING!!!           Approved Pharmacologic Management of Osteoporosis  Agent Approved for prevention Approved for treatment BMD increased spine/hip Fracture reduction  Estrogen/Hormone Therapy (Estrace, Estratab, Ogen, Premarin, Vivell, Prempro, Femhert, Orthoest) Yes Yes 3-6% 35% spine and hip  Bisphosphonates  (Fosamax, Actonel, Boniva) Yes Yes 3-8% 35-50% spine and non-spine  Calcitonin (Miacalcin, Calcimar, Fortical) No Yes 0-3% None stated  Raloxifene (Evista) Yes Yes 2-3% 30-55%  Parathyroid  Hormone (Forteo) No Yes, only in those at high risk for fracture None stated 53-65%     Recommended Daily Calcium Intakes   Population Group NIH/NOF* (mg elemental calcium)  Children 1-10 years 800-1200  Children 11-24 years 1200-1500  Men and Women 25-64 years At least 1200  Pregnant/Lactating At least 1200  Postmenopausal women with hormone replacement therapy At least 1200  Postmenopausal women without   hormone replacement therapy At least 1200  Men and women 65 + At least 1200  *In 1987, 1990, 1994, and 2000, the NIH held consensus conferences on osteoporosis and calcium.  This column shows the most recent recommendations regarding calcium intak for preventing and managing osteoporosis.          Calcium Content of Selected Foods  Dairy Foods Calcium Content (mg) Non-Dairy Foods Calcium Content (mg)  Buttermilk, 1 cup 300 Calcium-fortified juice, 1 cup 300  Milk, 1 cup 300 Salmon, canned with Bones, 2 oz 100  Lactaid milk, 1 cup 300-500 Oysters, raw 13-19 medium 226  Soy milk, 1 cup 200-300 Sardines, canned with bones, 3 oz 372  Yogurt (plain, lowfat) 1 cup 250-300 Shrimp, canned 3 oz 98  Frozen yogurt (fruit) 1 cup 200-600 Collard greens, cooked 1 cup 357  Cheddar, mozzarella, or [place] cheese, 1oz 205 Broccoli, cooked 1 cup 78  Cottage cheese (lowfat) 4 oz 200 Soybeans, cooked 1   cup 131  Part-skim ricotta cheese, 4oz 335 Tofu, 4oz* *  Vanilla ice cream, 1 cup 120-300    *Calcium content of tofu varies depending on processing method; check nutritional label on package for precise calcium content.     Suggested Guidelines for Calcium Supplement Use:  ? Calcium is absorbed most efficiently if taken in small amounts throughout the day.  Always divide the daily dose into smaller amounts if the total daily dose is 500mg or more per day.  The body cannot use more than 500mg Calcium at any one time. ? The use of manufactured supplements is encouraged.   Calcium as bone meal or dolomite may contain lead or other heavy metals as contaminants. ? Calcium supplements should not be taken with high fiber meals or with bulk forming laxatives. ? If calcium carbonate is used as the supplement form, it should be taken with meals to assure that stomach acid production is present to facilitate optimal dissolution and absorption of calcium.  This is important if atrophic gastritis with hypo- or achlorhydria is present, which it is in 20-50% of older individuals. ? It is important to drink plenty of fluids while using the supplement to help reduce problems with side effects like constipation or bloating.  If these symptoms become a problem, switching to another form of supplement may be the answer. ? Another alternative is calcium-fortified foods, including fruit juices, cereals, and breads.  These foods are now marketed with added calcium and may be less likely to cause side effects. ? Those with personal or family histories of kidney stones should be monitored to assure that hypercalcuria does not occur. CALCIUM INTAKE QUIZ  Dairy products are the primary source of calcium for most people.  For a quick estimate of your daily calcium intake, complete the following steps:  1. Use the chart below to determine your daily intake of calcium from diary foods. Servings of dairy per day 1 2 3 4 5 6 7 8  Milligrams (mg) of calcium: 250 500 750 1000 1250 1500 1750 2000   2.  Enter your total daily calcium intake from dairy foods:     _____mg  3.  Add 350 mg, which is the average for all other dietary sources:                 +            350 mg  4.  The sum of your total daily calcium intake:               ______mg  5.  Enter the recommended calcium intake for your age from the chart below;         ______mg  6.  Enter your daily intake from step 4 above and subtract:                             -        _______mg  7.  The result is how much additional calcium you  need:                                          ______mg      Recommended Daily Calcium Intake  Population Calcium (mg)  Children 1-10 years 800-1200  Children 11-24 years 1200-1500  Men and women 25-64 At   least 1200  Pregnant/Lactating At least 1200  Postmenopausal women with hormone replacement therapy At least 1200  Postmenopausal women without hormone replacement therapy At least 1200  Men and women 65+ At least 1200       SAFETY TIPS FOR FALL PREVENTION   1. Remove throw rugs and make certain carpet edges are securely fastened to the floor.  2. Reduce clutter, especially in traffic areas of the home.   3. Install/maintain sturdy handrails at stairs.  4. Increase wattage of lighting in hallways, bathrooms, kitchens, stairwells, and entrances to home.  5. Use night-lights near bed, in hallways, and in bathroom to improve night safety.  6. Install safety handrails in shower, tub, and around toilet.  Bathtubs and shower stalls should have non-skid surfaces.  7. When you must reach for something high, use a safety step stool, one with wide steps and a friction surface to stand on.  A type equipped with a high handrail is preferred.  8. If a cane or other walking aid has been recommended, use it to help increase your stability.  9. Wear supportive, cushioned, low-heeled shoes.  Avoid "scuffs" (backless bedroom slippers) and high heels.  10. Avoid rushing to answer a phone, doorbell, or anything else!  A portable phone that you can take from room to room with you is a good idea for security and safety.  11. Exercise regularly and stay active!!    Resources  National Osteoporosis Foundation www.NOF.org   Exercise for Osteoporosis; A Safe and Effective Way to Build Bone Density and Muscle Strength By: Dianne Daniels, M.A.    [facility] [address], [address] [place], [place] [address] Phone # [phone] Fax [phone]  

## 2015-01-09 DIAGNOSIS — K219 Gastro-esophageal reflux disease without esophagitis: Secondary | ICD-10-CM | POA: Diagnosis not present

## 2015-01-09 DIAGNOSIS — Q851 Tuberous sclerosis: Secondary | ICD-10-CM | POA: Diagnosis not present

## 2015-01-09 DIAGNOSIS — I4891 Unspecified atrial fibrillation: Secondary | ICD-10-CM | POA: Diagnosis not present

## 2015-01-09 DIAGNOSIS — R51 Headache: Secondary | ICD-10-CM | POA: Diagnosis not present

## 2015-01-09 DIAGNOSIS — R609 Edema, unspecified: Secondary | ICD-10-CM | POA: Diagnosis not present

## 2015-01-16 ENCOUNTER — Ambulatory Visit: Payer: Medicare Other | Admitting: Physical Therapy

## 2015-01-17 DIAGNOSIS — N390 Urinary tract infection, site not specified: Secondary | ICD-10-CM | POA: Diagnosis not present

## 2015-01-20 ENCOUNTER — Other Ambulatory Visit (HOSPITAL_COMMUNITY): Payer: Self-pay | Admitting: Orthopaedic Surgery

## 2015-01-20 ENCOUNTER — Encounter: Payer: Self-pay | Admitting: *Deleted

## 2015-01-23 ENCOUNTER — Encounter: Payer: Self-pay | Admitting: Diagnostic Neuroimaging

## 2015-01-23 ENCOUNTER — Ambulatory Visit (HOSPITAL_COMMUNITY)
Admission: RE | Admit: 2015-01-23 | Discharge: 2015-01-23 | Disposition: A | Discharge status: Home / Self Care | Payer: Medicare Other | Source: Ambulatory Visit | Attending: Nurse Practitioner | Admitting: Nurse Practitioner

## 2015-01-23 ENCOUNTER — Ambulatory Visit (INDEPENDENT_AMBULATORY_CARE_PROVIDER_SITE_OTHER): Payer: Medicare Other | Admitting: Diagnostic Neuroimaging

## 2015-01-23 VITALS — BP 118/62 | HR 77 | Ht 62.0 in | Wt 196.6 lb

## 2015-01-23 DIAGNOSIS — K219 Gastro-esophageal reflux disease without esophagitis: Secondary | ICD-10-CM | POA: Insufficient documentation

## 2015-01-23 DIAGNOSIS — I471 Supraventricular tachycardia: Secondary | ICD-10-CM

## 2015-01-23 DIAGNOSIS — Z7982 Long term (current) use of aspirin: Secondary | ICD-10-CM | POA: Diagnosis not present

## 2015-01-23 DIAGNOSIS — Z8249 Family history of ischemic heart disease and other diseases of the circulatory system: Secondary | ICD-10-CM | POA: Insufficient documentation

## 2015-01-23 DIAGNOSIS — Z87891 Personal history of nicotine dependence: Secondary | ICD-10-CM | POA: Insufficient documentation

## 2015-01-23 DIAGNOSIS — Z885 Allergy status to narcotic agent status: Secondary | ICD-10-CM | POA: Diagnosis not present

## 2015-01-23 DIAGNOSIS — G4733 Obstructive sleep apnea (adult) (pediatric): Secondary | ICD-10-CM | POA: Diagnosis not present

## 2015-01-23 DIAGNOSIS — R51 Headache: Secondary | ICD-10-CM

## 2015-01-23 DIAGNOSIS — Z79899 Other long term (current) drug therapy: Secondary | ICD-10-CM | POA: Diagnosis not present

## 2015-01-23 DIAGNOSIS — Q851 Tuberous sclerosis: Secondary | ICD-10-CM | POA: Diagnosis not present

## 2015-01-23 DIAGNOSIS — G44209 Tension-type headache, unspecified, not intractable: Secondary | ICD-10-CM | POA: Diagnosis not present

## 2015-01-23 DIAGNOSIS — Z91041 Radiographic dye allergy status: Secondary | ICD-10-CM | POA: Insufficient documentation

## 2015-01-23 DIAGNOSIS — R519 Headache, unspecified: Secondary | ICD-10-CM

## 2015-01-23 MED ORDER — DILTIAZEM HCL ER COATED BEADS 120 MG PO CP24: 120.0000 mg | ORAL_CAPSULE | Freq: Every day | ORAL | Status: DC

## 2015-01-23 NOTE — Patient Instructions (Signed)
I will check additional testing (MRI and labs).
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
## 2015-01-24 ENCOUNTER — Encounter (HOSPITAL_COMMUNITY): Payer: Self-pay | Admitting: Nurse Practitioner

## 2015-01-24 ENCOUNTER — Telehealth: Payer: Self-pay

## 2015-01-24 LAB — SEDIMENTATION RATE: SED RATE: 13 mm/h (ref 0–40)

## 2015-01-24 LAB — C-REACTIVE PROTEIN: CRP: 2.2 mg/L (ref 0.0–4.9)

## 2015-01-24 NOTE — Telephone Encounter (Signed)
-----   Message from Penni Bombard, MD sent at 01/24/2015  1:11 PM EDT ----- pls call with normal labs. -VRP

## 2015-01-24 NOTE — Telephone Encounter (Signed)
Spoke to patient. Gave results. Patient verbalized understanding. 

## 2015-01-24 NOTE — Progress Notes (Signed)
Patient ID: [name], female   DOB: 22-Feb-1946, 69 y.o.   MRN: [id number]     Primary Care Physician: Reginia Naas, MD Referring Physician: Dr. Elinor Dodge is a 69 y.o. female with a h/o atrial tachycardia that is in the afib clinic for f/u. On last visit with Dr. Rayann Heman she was started on diltiazem with improvement of symptoms/palpitations. She is on asa, no DOAC due to no finding of afib on recent monitor when reviewed by Dr. Rayann Heman. She has had a slight headache since starting med but it does not bother to the point that she thinks she has to stop med. She recently saw her neurologist and has a head MRI scheduled.  Today, she denies symptoms of palpitations, chest pain, shortness of breath, orthopnea, PND, lower extremity edema, dizziness, presyncope, syncope, or neurologic sequela. The patient is tolerating medications without difficulties and is otherwise without complaint today.   Past Medical History  Diagnosis Date  . Arthritis   . Diverticulosis   . GERD (gastroesophageal reflux disease)   . Osteoporosis   . Tuberous sclerosis     EXTERNAL LESIONS--  MAINLY HANDS (INTERNAL SCLEROTIC BONY LESIONS LUMBAR & PELVIS PER CT 11/2013)  . Seasonal allergies   . H/O hiatal hernia   . History of diverticulitis of colon   . Nodule of right lung     BENIGN--- AND STABLE PER  CT  11/2013  . History of gastric ulcer   . History of epilepsy     childhood age 32 to 73  ---secondary to high calcium deposts of optic nerve---  none since age 69 with pregency  . IBS (irritable bowel syndrome)   . History of kidney stones   . History of shingles     MARCH 2014--  BACK AREA  . History of vulvar dysplasia     S/P  LASER ABLATION 2014  . Right ureteral stone   . Ureteral stenosis, right   . DDD (degenerative disc disease), lumbosacral   . Renal cyst, left     STABLE PER CT 11/2013  . PONV (postoperative nausea and vomiting)     occurred back in 1970's   . Anginal  pain     hx of pt relates to stress last occurrence 8 to 10 years ago   . Tachycardia   . Mild obstructive sleep apnea     SLEEP STUDY  11-19-2012--  NO CPAP RX (DID NOT MEET CRITIRIA)  . Shortness of breath dyspnea     walking distances and climbing stairs  . Bronchitis     hx of   . Phlebitis     hx of    Past Surgical History  Procedure Laterality Date  . Bladder surgery  1991    bladder reconstruction of torsion  . Colonoscopy    . Upper gastrointestinal endoscopy    . Cataract extraction w/ intraocular lens  implant, bilateral    . Cholecystectomy  11-18-2003  . Cardiac catheterization  06-26-2001    NORMAL CORONARY ARTERIES  . Orif right bimalleolar ankle fx  12-18-2003  . 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 Lumbar disc surgery  1985  . Vaginal hysterectomy  1975  . Hemorrhoid surgery  1970's  . Co2 laser application N/A 12/04/8268    Procedure: CO2 LASER APPLICATION;  Surgeon: Terrance Mass, MD;  Location: [facility];  Service: Gynecology;  Laterality:  N/A;CO2 laser of vaginal and vulvar lesions  . Gynecologic cryosurgery    . Dilation and curettage of uterus  multiple -- last one 1975  . Augmentation mammaplasty      SALINE  . Cystoscopy with retrograde pyelogram, ureteroscopy and stent placement Right 11/22/2013    Procedure: CYSTOSCOPY WITH RETROGRADE PYELOGRAM, URETEROSCOPY AND STENT PLACEMENT;  Surgeon: Sharyn Creamer, MD;  Location: [facility];  Service: Urology;  Laterality: Right;  . Cystoscopy/retrograde/ureteroscopy Right 12/01/2013    Procedure: CYSTOSCOPY, RIGHT RETROGRADE/RIGHT DIGITAL URETEROSCOPY, RIGHT URETERAL STENT EXCHANGE;  Surgeon: Sharyn Creamer, MD;  Location: [facility];  Service: Urology;  Laterality: Right;  STAGED RIGHT URETEROSCOPY RIGHT URETER DILATION    . Holmium laser application Right 7/0/3500    Procedure: HOLMIUM LASER  APPLICATION;  Surgeon: Sharyn Creamer, MD;  Location[facility];  Service: Urology;  Laterality: Right;  . Total hip arthroplasty Left 09/16/2014    Procedure: LEFT TOTAL HIP ARTHROPLASTY ANTERIOR APPROACH;  Surgeon: Mcarthur Rossetti, MD;  Location: [facility];  Service: Orthopedics;  Laterality: Left;  . Hip closed reduction Left 10/16/2014    Procedure: CLOSED MANIPULATION HIP;  Surgeon: Mcarthur Rossetti, MD;  Location: [facility];  Service: Orthopedics;  Laterality: Left;  . Anterior hip revision Left 10/18/2014    Procedure: EXCHANGE LEFT HIP BALL OF DIRECT ANTERIOR TOTAL HIP ARTHROPLASTY;  Surgeon: Mcarthur Rossetti, MD;  Location: [place];  Service: Orthopedics;  Laterality: Left;    Current Outpatient Prescriptions  Medication Sig Dispense Refill  . aspirin 325 MG EC tablet Take 1 tablet (325 mg total) by mouth daily. 90 tablet 0  . cholecalciferol (VITAMIN D) 1000 UNITS tablet Take 4,000 Units by mouth daily.     . cyclobenzaprine (FLEXERIL) 10 MG tablet Take 1 tablet (10 mg total) by mouth 3 (three) times daily as needed for muscle spasms. 60 tablet 0  . diltiazem (CARDIZEM CD) 120 MG 24 hr capsule Take 1 capsule (120 mg total) by mouth daily. 90 capsule 3  . furosemide (LASIX) 20 MG tablet TAKE 1 TABLET BY MOUTH DAILY 90 tablet 0  . HYDROcodone-acetaminophen (NORCO/VICODIN) 5-325 MG per tablet Take 1-2 tablets by mouth every 4 (four) hours as needed for moderate pain. 60 tablet 0  . omeprazole (PRILOSEC) 40 MG capsule Take 40 mg by mouth daily.    . valACYclovir (VALTREX) 1000 MG tablet Take 1 tablet (1,000 mg total) by mouth 3 (three) times daily as needed (for outbreaks). 20 tablet 0  . Vitamin D, Ergocalciferol, (DRISDOL) 50000 UNITS CAPS capsule Take 50,000 Units by mouth 2 (two) times a week. Takes on Sunday and Wednesday     No current facility-administered medications for this encounter.    Allergies  Allergen Reactions  . Other     All  anti-depressants pt states makes her feel as if her body is shutting down.  Marland Kitchen Zoloft [Sertraline Hcl]     Pt can not take SSRI - nausea and rash   . Percocet [Oxycodone-Acetaminophen] Nausea And Vomiting  . Contrast Media [Iodinated Diagnostic Agents] Rash    States was oral contrast- not betadine based IV contrast    Social History   Social History  . Marital Status: Single    Spouse Name: N/A  . Number of Children: 2  . Years of Education: 12   Occupational History  . driver Other    [employer]   Social History Main Topics  . Smoking status:  Former Smoker -- 2.00 packs/day for 20 years    Types: Cigarettes    Quit date: 06/03/1998  . Smokeless tobacco: Never Used  . Alcohol Use: No  . Drug Use: No  . Sexual Activity:    Partners: Male   Other Topics Concern  . Not on file   Social History Narrative   Exercise-no   Lives alone    Drinks decaf coffee 1 cup a day     Family History  Problem Relation Age of Onset  . Heart disease Mother   . Tuberous sclerosis Mother   . Tuberous sclerosis Sister   . Tuberous sclerosis Daughter   . Tuberous sclerosis Son     2 sons  . Tuberous sclerosis Maternal Aunt   . Tuberous sclerosis Maternal Grandmother   . Tuberous sclerosis Maternal Aunt   . Tuberous sclerosis Maternal Aunt   . Tuberous sclerosis Cousin   . Uterine cancer Mother     ROS- All systems are reviewed and negative except as per the HPI above  Physical Exam: Filed Vitals:   01/23/15 1413  BP: 116/76  Pulse: 85  Height: 5\' 2"  (1.575 m)  Weight: 196 lb 12.8 oz (89.268 kg)    GEN- The patient is well appearing, alert and oriented x 3 today.   Head- normocephalic, atraumatic Eyes-  Sclera clear, conjunctiva pink Ears- hearing intact Oropharynx- clear Neck- supple, no JVP Lymph- no cervical lymphadenopathy Lungs- Clear to ausculation bilaterally, normal work of breathing Heart- Regular rate and rhythm, no murmurs, rubs or gallops, PMI not  laterally displaced GI- soft, NT, ND, + BS Extremities- no clubbing, cyanosis, or edema MS- no significant deformity or atrophy Skin- no rash or lesion Psych- euthymic mood, full affect Neuro- strength and sensation are intact  EKG- Unusual P axsis, possible atrial rhythm with PAC's. Nonspecific ST abnormality  Assessment and Plan: 1. Atrial tachycardia Diltiazem has been helpful to lessen palpitations/ racing heart rate Continue with med without change, ? Causing mild h/a at times, MRI pending per neurologist Continue asa  Will f/u in two months,sooner if h/a worsens If still is having h/a contributed to cardizem will try changing to BB.  Geroge Baseman Carroll, [facility] [address] [place], [place] [address] [phone]

## 2015-01-26 ENCOUNTER — Ambulatory Visit: Payer: Self-pay | Admitting: Internal Medicine

## 2015-01-27 ENCOUNTER — Telehealth: Payer: Self-pay | Admitting: *Deleted

## 2015-01-27 NOTE — Telephone Encounter (Signed)
Patient called and stated that she feels like the diltiazem is changing her temper and mood. Please advise. Thanks, MI

## 2015-01-27 NOTE — Telephone Encounter (Signed)
Pt stated that since she has been px diltiazem she has been short tempered and mood. Pt was told will contacted her once Butch Penny is available  to discuss next steps.

## 2015-01-30 ENCOUNTER — Ambulatory Visit: Payer: Self-pay | Admitting: Internal Medicine

## 2015-01-31 ENCOUNTER — Other Ambulatory Visit (HOSPITAL_COMMUNITY): Payer: Self-pay | Admitting: *Deleted

## 2015-01-31 ENCOUNTER — Ambulatory Visit: Payer: Self-pay

## 2015-01-31 MED ORDER — METOPROLOL SUCCINATE ER 25 MG PO TB24: 25.0000 mg | ORAL_TABLET | Freq: Every day | ORAL | Status: DC

## 2015-01-31 NOTE — Telephone Encounter (Signed)
Pt called in stating having mood swings and short tempered since starting cardizem. Would like to switch to a different medication.  Patient instructed to stop taking cardizem 120mg  once a day and start taking metoprolol XL 25mg  once a day. Prescription called in to [facility] in [place] per patient request and appt for follow up bp/hr check made for 2 weeks.

## 2015-02-02 NOTE — Telephone Encounter (Signed)
Changed cardizem to metoprolol due to c/o short temper on cardizem.

## 2015-02-02 NOTE — Telephone Encounter (Signed)
Butch Penny, were you able to follow-up on this?

## 2015-02-07 ENCOUNTER — Encounter (HOSPITAL_COMMUNITY)
Admission: RE | Admit: 2015-02-07 | Discharge: 2015-02-07 | Disposition: A | Discharge status: Home / Self Care | Payer: Medicare Other | Source: Ambulatory Visit | Attending: Orthopaedic Surgery | Admitting: Orthopaedic Surgery

## 2015-02-07 ENCOUNTER — Encounter (HOSPITAL_COMMUNITY): Payer: Self-pay

## 2015-02-07 ENCOUNTER — Ambulatory Visit (HOSPITAL_COMMUNITY)
Admission: RE | Admit: 2015-02-07 | Discharge: 2015-02-07 | Disposition: A | Discharge status: Home / Self Care | Payer: Medicare Other | Source: Ambulatory Visit | Attending: Nurse Practitioner | Admitting: Nurse Practitioner

## 2015-02-07 DIAGNOSIS — I480 Paroxysmal atrial fibrillation: Secondary | ICD-10-CM

## 2015-02-07 DIAGNOSIS — I471 Supraventricular tachycardia: Secondary | ICD-10-CM | POA: Diagnosis not present

## 2015-02-07 HISTORY — DX: Cardiac arrhythmia, unspecified: I49.9

## 2015-02-07 HISTORY — DX: Unspecified convulsions: R56.9

## 2015-02-07 HISTORY — DX: Unspecified fall, initial encounter: W19.XXXA

## 2015-02-07 HISTORY — DX: Repeated falls: R29.6

## 2015-02-07 LAB — BASIC METABOLIC PANEL
Anion gap: 6 (ref 5–15)
BUN: 12 mg/dL (ref 6–20)
CALCIUM: 8.3 mg/dL — AB (ref 8.9–10.3)
CO2: 29 mmol/L (ref 22–32)
Chloride: 105 mmol/L (ref 101–111)
Creatinine, Ser: 0.71 mg/dL (ref 0.44–1.00)
GFR calc non Af Amer: >60 mL/min (ref >60)
Glucose, Bld: 105 mg/dL — ABNORMAL HIGH (ref 65–99)
POTASSIUM: 4.2 mmol/L (ref 3.5–5.1)
Sodium: 140 mmol/L (ref 135–145)

## 2015-02-07 LAB — CBC
HEMATOCRIT: 34.1 % — AB (ref 36.0–46.0)
Hemoglobin: 10.8 g/dL — ABNORMAL LOW (ref 12.0–15.0)
MCH: 26 pg (ref 26.0–34.0)
MCHC: 31.7 g/dL (ref 30.0–36.0)
MCV: 82.2 fL (ref 78.0–100.0)
Platelets: 278 10*3/uL (ref 150–400)
RBC: 4.15 MIL/uL (ref 3.87–5.11)
RDW: 14.9 % (ref 11.5–15.5)
WBC: 6.5 10*3/uL (ref 4.0–10.5)

## 2015-02-07 LAB — APTT: aPTT: 24 seconds (ref 24–37)

## 2015-02-07 LAB — PROTIME-INR
INR: 1.08 (ref 0.00–1.49)
PROTHROMBIN TIME: 14.2 s (ref 11.6–15.2)

## 2015-02-07 LAB — SURGICAL PCR SCREEN
MRSA, PCR: NEGATIVE
STAPHYLOCOCCUS AUREUS: NEGATIVE

## 2015-02-07 NOTE — Progress Notes (Addendum)
Patient here for follow up EKG/BP check since changing from cardizem to toprol.  BP 112/62 and reports feeling much better on toprol.    Pt asked to be changed from cardizem for some side effects of h/a, short tempered. I changed her to metoprolol succinate 25 mg a day and she is feeling better. EKG shows an atrial ectopic pacemaker with rate controlled v rates at 86 bpm. Before cardizem, she had an atrial tachycardia, not afib, diagnosed byDr. Allred, and he did not feel she required blood thinner.

## 2015-02-07 NOTE — Patient Instructions (Signed)
[name]  02/07/2015   Your procedure is scheduled on: Friday February 17, 2015   Report to [facility] Main  Entrance take [place]  elevators to 3rd floor to  [place] at 10:00 AM.  Call this number if you have problems the morning of surgery [phone]   Remember: ONLY 1 PERSON MAY GO WITH YOU TO SHORT STAY TO GET  READY MORNING OF [place].  Do not eat food After Midnight but may take clear liquids till 6:00 am day of surgery then nothing by mouth.      Take these medicines the morning of surgery with A SIP OF WATER:  Hydrocodone - Acetaminophen if needed; Omeprazole (Prilosec)                               You may not have any metal on your body including hair pins and              piercings  Do not wear jewelry, make-up, lotions, powders or perfumes, deodorant             Do not wear nail polish.  Do not shave  48 hours prior to surgery.               Do not bring valuables to the hospital. [place].  Contacts, dentures or bridgework may not be worn into surgery.  Leave suitcase in the car. After surgery it may be brought to your room.                  Please read over the following fact sheets you were given:MRSA INFORMATION SHEET _____________________________________________________________________             [facility] - Preparing for Surgery Before surgery, you can play an important role.  Because skin is not sterile, your skin needs to be as free of germs as possible.  You can reduce the number of germs on your skin by washing with CHG (chlorahexidine gluconate) soap before surgery.  CHG is an antiseptic cleaner which kills germs and bonds with the skin to continue killing germs even after washing. Please DO NOT use if you have an allergy to CHG or antibacterial soaps.  If your skin becomes reddened/irritated stop using the CHG and inform your nurse when you arrive at Short  Stay. Do not shave (including legs and underarms) for at least 48 hours prior to the first CHG shower.  You may shave your face/neck. Please follow these instructions carefully:  1.  Shower with CHG Soap the night before surgery and the  morning of Surgery.  2.  If you choose to wash your hair, wash your hair first as usual with your  normal  shampoo.  3.  After you shampoo, rinse your hair and body thoroughly to remove the  shampoo.                           4.  Use CHG as you would any other liquid soap.  You can apply chg directly  to the skin and wash  Gently with a scrungie or clean washcloth.  5.  Apply the CHG Soap to your body ONLY FROM THE NECK DOWN.   Do not use on face/ open                           Wound or open sores. Avoid contact with eyes, ears mouth and genitals (private parts).                       Wash face,  Genitals (private parts) with your normal soap.             6.  Wash thoroughly, paying special attention to the area where your surgery  will be performed.  7.  Thoroughly rinse your body with warm water from the neck down.  8.  DO NOT shower/wash with your normal soap after using and rinsing off  the CHG Soap.                9.  Pat yourself dry with a clean towel.            10.  Wear clean pajamas.            11.  Place clean sheets on your bed the night of your first shower and do not  sleep with pets. Day of Surgery : Do not apply any lotions/deodorants the morning of surgery.  Please wear clean clothes to the hospital/surgery center.  FAILURE TO FOLLOW THESE INSTRUCTIONS MAY RESULT IN THE CANCELLATION OF YOUR SURGERY PATIENT SIGNATURE_________________________________  NURSE SIGNATURE__________________________________  ________________________________________________________________________    CLEAR LIQUID DIET   Foods Allowed                                                                     Foods Excluded  Coffee and tea,  regular and decaf                             liquids that you cannot  Plain Jell-O in any flavor                                             see through such as: Fruit ices (not with fruit pulp)                                     milk, soups, orange juice  Iced Popsicles                                    All solid food Carbonated beverages, regular and diet                                    Cranberry, grape and apple juices Sports drinks like Gatorade Lightly seasoned clear broth or consume(fat free) Sugar, honey syrup  Sample Menu Breakfast                                Lunch                                     Supper Cranberry juice                    Beef broth                            Chicken broth Jell-O                                     Grape juice                           Apple juice Coffee or tea                        Jell-O                                      Popsicle                                                Coffee or tea                        Coffee or tea  _____________________________________________________________________

## 2015-02-07 NOTE — Progress Notes (Signed)
Instructed pt to contact MD in regards to clarification to aspirin prescription. Pt verbalized understanding.

## 2015-02-07 NOTE — Progress Notes (Signed)
EKG per epic 01/23/2015 and 02/07/2015 Stress epic 12/01/2014 with noted per Dr Debara Pickett  ECHO epic 12/19/2014

## 2015-02-13 ENCOUNTER — Ambulatory Visit: Payer: Self-pay | Admitting: Internal Medicine

## 2015-02-16 NOTE — Progress Notes (Signed)
Pt aware of surgical time change. Pt aware to arrive at Short Stay at 7:50am 02/17/2015. Also aware to disregard clear liquid diet instruction given per PAT visit 02/07/2015. Pt instructed No food or drink after midnight. Pt verbalized understanding.

## 2015-02-17 ENCOUNTER — Inpatient Hospital Stay (HOSPITAL_COMMUNITY): Payer: Medicare Other | Admitting: Anesthesiology

## 2015-02-17 ENCOUNTER — Inpatient Hospital Stay (HOSPITAL_COMMUNITY)
Admission: RE | Admit: 2015-02-17 | Discharge: 2015-02-20 | DRG: 470 | Disposition: A | Discharge status: Skilled Nursing Facility | Payer: Medicare Other | Source: Ambulatory Visit | Attending: Orthopaedic Surgery | Admitting: Orthopaedic Surgery

## 2015-02-17 ENCOUNTER — Inpatient Hospital Stay (HOSPITAL_COMMUNITY): Payer: Medicare Other

## 2015-02-17 ENCOUNTER — Encounter (HOSPITAL_COMMUNITY)
Admission: RE | Disposition: A | Discharge status: Skilled Nursing Facility | Payer: Self-pay | Source: Ambulatory Visit | Attending: Orthopaedic Surgery

## 2015-02-17 ENCOUNTER — Encounter (HOSPITAL_COMMUNITY): Payer: Self-pay | Admitting: *Deleted

## 2015-02-17 DIAGNOSIS — Z471 Aftercare following joint replacement surgery: Secondary | ICD-10-CM | POA: Diagnosis not present

## 2015-02-17 DIAGNOSIS — M25551 Pain in right hip: Secondary | ICD-10-CM | POA: Diagnosis present

## 2015-02-17 DIAGNOSIS — G4733 Obstructive sleep apnea (adult) (pediatric): Secondary | ICD-10-CM | POA: Diagnosis present

## 2015-02-17 DIAGNOSIS — R29898 Other symptoms and signs involving the musculoskeletal system: Secondary | ICD-10-CM | POA: Diagnosis not present

## 2015-02-17 DIAGNOSIS — Z96641 Presence of right artificial hip joint: Secondary | ICD-10-CM | POA: Diagnosis not present

## 2015-02-17 DIAGNOSIS — Z87891 Personal history of nicotine dependence: Secondary | ICD-10-CM | POA: Diagnosis not present

## 2015-02-17 DIAGNOSIS — M810 Age-related osteoporosis without current pathological fracture: Secondary | ICD-10-CM | POA: Diagnosis present

## 2015-02-17 DIAGNOSIS — M169 Osteoarthritis of hip, unspecified: Secondary | ICD-10-CM | POA: Diagnosis not present

## 2015-02-17 DIAGNOSIS — Z96642 Presence of left artificial hip joint: Secondary | ICD-10-CM | POA: Diagnosis present

## 2015-02-17 DIAGNOSIS — K219 Gastro-esophageal reflux disease without esophagitis: Secondary | ICD-10-CM | POA: Diagnosis present

## 2015-02-17 DIAGNOSIS — Z6836 Body mass index (BMI) 36.0-36.9, adult: Secondary | ICD-10-CM

## 2015-02-17 DIAGNOSIS — D62 Acute posthemorrhagic anemia: Secondary | ICD-10-CM | POA: Diagnosis not present

## 2015-02-17 DIAGNOSIS — M1611 Unilateral primary osteoarthritis, right hip: Principal | ICD-10-CM | POA: Diagnosis present

## 2015-02-17 DIAGNOSIS — Q851 Tuberous sclerosis: Secondary | ICD-10-CM | POA: Diagnosis not present

## 2015-02-17 DIAGNOSIS — J302 Other seasonal allergic rhinitis: Secondary | ICD-10-CM | POA: Diagnosis not present

## 2015-02-17 DIAGNOSIS — E669 Obesity, unspecified: Secondary | ICD-10-CM | POA: Diagnosis present

## 2015-02-17 DIAGNOSIS — R008 Other abnormalities of heart beat: Secondary | ICD-10-CM | POA: Diagnosis not present

## 2015-02-17 DIAGNOSIS — Z419 Encounter for procedure for purposes other than remedying health state, unspecified: Secondary | ICD-10-CM

## 2015-02-17 DIAGNOSIS — R29818 Other symptoms and signs involving the nervous system: Secondary | ICD-10-CM | POA: Diagnosis not present

## 2015-02-17 DIAGNOSIS — M1990 Unspecified osteoarthritis, unspecified site: Secondary | ICD-10-CM | POA: Diagnosis not present

## 2015-02-17 DIAGNOSIS — R609 Edema, unspecified: Secondary | ICD-10-CM | POA: Diagnosis not present

## 2015-02-17 DIAGNOSIS — Z90710 Acquired absence of both cervix and uterus: Secondary | ICD-10-CM | POA: Diagnosis not present

## 2015-02-17 DIAGNOSIS — K589 Irritable bowel syndrome without diarrhea: Secondary | ICD-10-CM | POA: Diagnosis not present

## 2015-02-17 DIAGNOSIS — Z9181 History of falling: Secondary | ICD-10-CM | POA: Diagnosis not present

## 2015-02-17 DIAGNOSIS — K449 Diaphragmatic hernia without obstruction or gangrene: Secondary | ICD-10-CM | POA: Diagnosis present

## 2015-02-17 DIAGNOSIS — R2689 Other abnormalities of gait and mobility: Secondary | ICD-10-CM | POA: Diagnosis not present

## 2015-02-17 DIAGNOSIS — K5790 Diverticulosis of intestine, part unspecified, without perforation or abscess without bleeding: Secondary | ICD-10-CM | POA: Diagnosis not present

## 2015-02-17 DIAGNOSIS — M5137 Other intervertebral disc degeneration, lumbosacral region: Secondary | ICD-10-CM | POA: Diagnosis not present

## 2015-02-17 DIAGNOSIS — M160 Bilateral primary osteoarthritis of hip: Secondary | ICD-10-CM | POA: Diagnosis not present

## 2015-02-17 HISTORY — PX: TOTAL HIP ARTHROPLASTY: SHX124

## 2015-02-17 LAB — TYPE AND SCREEN
ABO/RH(D): O NEG
Antibody Screen: NEGATIVE

## 2015-02-17 SURGERY — ARTHROPLASTY, HIP, TOTAL, ANTERIOR APPROACH
Anesthesia: General | Site: Hip | Laterality: Right

## 2015-02-17 MED ORDER — MIDAZOLAM HCL 2 MG/2ML IJ SOLN
INTRAMUSCULAR | Status: AC
  Filled 2015-02-17: qty 4

## 2015-02-17 MED ORDER — ONDANSETRON HCL 4 MG/2ML IJ SOLN: 4.0000 mg | Freq: Four times a day (QID) | INTRAMUSCULAR | Status: DC | PRN

## 2015-02-17 MED ORDER — CEFAZOLIN SODIUM-DEXTROSE 2-3 GM-% IV SOLR
INTRAVENOUS | Status: AC
  Filled 2015-02-17: qty 50

## 2015-02-17 MED ORDER — DIPHENHYDRAMINE HCL 12.5 MG/5ML PO ELIX: 12.5000 mg | ORAL_SOLUTION | ORAL | Status: DC | PRN

## 2015-02-17 MED ORDER — SODIUM CHLORIDE 0.9 % IR SOLN
Status: DC | PRN
  Administered 2015-02-17: 1000 mL

## 2015-02-17 MED ORDER — LIDOCAINE HCL (CARDIAC) 20 MG/ML IV SOLN
INTRAVENOUS | Status: DC | PRN
  Administered 2015-02-17: 50 mg via INTRAVENOUS

## 2015-02-17 MED ORDER — CYCLOBENZAPRINE HCL 10 MG PO TABS
10.0000 mg | ORAL_TABLET | Freq: Three times a day (TID) | ORAL | Status: DC | PRN
  Administered 2015-02-18: 10 mg via ORAL
  Filled 2015-02-17: qty 1

## 2015-02-17 MED ORDER — FENTANYL CITRATE (PF) 100 MCG/2ML IJ SOLN
INTRAMUSCULAR | Status: AC
  Filled 2015-02-17: qty 4

## 2015-02-17 MED ORDER — FENTANYL CITRATE (PF) 100 MCG/2ML IJ SOLN
INTRAMUSCULAR | Status: DC | PRN
  Administered 2015-02-17: 100 ug via INTRAVENOUS
  Administered 2015-02-17 (×2): 50 ug via INTRAVENOUS

## 2015-02-17 MED ORDER — TRAMADOL HCL 50 MG PO TABS
100.0000 mg | ORAL_TABLET | Freq: Four times a day (QID) | ORAL | Status: DC | PRN
  Administered 2015-02-20: 100 mg via ORAL
  Filled 2015-02-17: qty 2

## 2015-02-17 MED ORDER — LIDOCAINE HCL (CARDIAC) 20 MG/ML IV SOLN
INTRAVENOUS | Status: AC
  Filled 2015-02-17: qty 5

## 2015-02-17 MED ORDER — ACETAMINOPHEN 500 MG PO TABS: 500.0000 mg | ORAL_TABLET | Freq: Four times a day (QID) | ORAL | Status: DC | PRN

## 2015-02-17 MED ORDER — ROCURONIUM BROMIDE 100 MG/10ML IV SOLN
INTRAVENOUS | Status: AC
  Filled 2015-02-17: qty 1

## 2015-02-17 MED ORDER — NEOSTIGMINE METHYLSULFATE 10 MG/10ML IV SOLN
INTRAVENOUS | Status: DC | PRN
  Administered 2015-02-17: 4 mg via INTRAVENOUS

## 2015-02-17 MED ORDER — ONDANSETRON HCL 4 MG/2ML IJ SOLN
INTRAMUSCULAR | Status: DC | PRN
  Administered 2015-02-17: 4 mg via INTRAVENOUS

## 2015-02-17 MED ORDER — PROPOFOL 10 MG/ML IV BOLUS
INTRAVENOUS | Status: DC | PRN
  Administered 2015-02-17: 180 mg via INTRAVENOUS

## 2015-02-17 MED ORDER — PROPOFOL 10 MG/ML IV BOLUS
INTRAVENOUS | Status: AC
  Filled 2015-02-17: qty 20

## 2015-02-17 MED ORDER — ASPIRIN EC 325 MG PO TBEC
325.0000 mg | DELAYED_RELEASE_TABLET | Freq: Two times a day (BID) | ORAL | Status: DC
  Administered 2015-02-18 – 2015-02-20 (×6): 325 mg via ORAL
  Filled 2015-02-17 (×7): qty 1

## 2015-02-17 MED ORDER — TRANEXAMIC ACID 1000 MG/10ML IV SOLN
1000.0000 mg | INTRAVENOUS | Status: AC
  Administered 2015-02-17: 1000 mg via INTRAVENOUS
  Filled 2015-02-17: qty 10

## 2015-02-17 MED ORDER — SUCCINYLCHOLINE CHLORIDE 20 MG/ML IJ SOLN
INTRAMUSCULAR | Status: DC | PRN
  Administered 2015-02-17: 100 mg via INTRAVENOUS

## 2015-02-17 MED ORDER — MIDAZOLAM HCL 5 MG/5ML IJ SOLN
INTRAMUSCULAR | Status: DC | PRN
  Administered 2015-02-17: 2 mg via INTRAVENOUS

## 2015-02-17 MED ORDER — MENTHOL 3 MG MT LOZG
1.0000 | LOZENGE | OROMUCOSAL | Status: DC | PRN
  Administered 2015-02-17: 3 mg via ORAL
  Filled 2015-02-17: qty 9

## 2015-02-17 MED ORDER — FUROSEMIDE 20 MG PO TABS
20.0000 mg | ORAL_TABLET | Freq: Every day | ORAL | Status: DC
  Administered 2015-02-17 – 2015-02-20 (×4): 20 mg via ORAL
  Filled 2015-02-17 (×4): qty 1

## 2015-02-17 MED ORDER — SODIUM CHLORIDE 0.9 % IV SOLN
INTRAVENOUS | Status: DC
Start: 2015-02-17 — End: 2015-02-20
  Administered 2015-02-17 – 2015-02-18 (×2): via INTRAVENOUS

## 2015-02-17 MED ORDER — HYDROMORPHONE HCL 1 MG/ML IJ SOLN
1.0000 mg | INTRAMUSCULAR | Status: DC | PRN
  Administered 2015-02-17 (×2): 1 mg via INTRAVENOUS
  Filled 2015-02-17 (×2): qty 1

## 2015-02-17 MED ORDER — LACTATED RINGERS IV SOLN
INTRAVENOUS | Status: DC
  Administered 2015-02-17: 1000 mL via INTRAVENOUS

## 2015-02-17 MED ORDER — HYDROCODONE-ACETAMINOPHEN 7.5-325 MG PO TABS
1.0000 | ORAL_TABLET | ORAL | Status: DC | PRN
  Administered 2015-02-17: 1 via ORAL
  Administered 2015-02-18 – 2015-02-20 (×9): 2 via ORAL
  Filled 2015-02-17 (×2): qty 2
  Filled 2015-02-17: qty 1
  Filled 2015-02-17 (×7): qty 2

## 2015-02-17 MED ORDER — ALUM & MAG HYDROXIDE-SIMETH 200-200-20 MG/5ML PO SUSP: 30.0000 mL | ORAL | Status: DC | PRN

## 2015-02-17 MED ORDER — METHOCARBAMOL 1000 MG/10ML IJ SOLN
500.0000 mg | Freq: Four times a day (QID) | INTRAVENOUS | Status: DC | PRN
  Administered 2015-02-17: 500 mg via INTRAVENOUS
  Filled 2015-02-17 (×2): qty 5

## 2015-02-17 MED ORDER — VITAMIN D3 25 MCG (1000 UNIT) PO TABS
4000.0000 [IU] | ORAL_TABLET | Freq: Every day | ORAL | Status: DC
  Administered 2015-02-18 – 2015-02-20 (×3): 4000 [IU] via ORAL
  Filled 2015-02-17 (×3): qty 4

## 2015-02-17 MED ORDER — ONDANSETRON HCL 4 MG/2ML IJ SOLN
INTRAMUSCULAR | Status: AC
  Filled 2015-02-17: qty 2

## 2015-02-17 MED ORDER — GLYCOPYRROLATE 0.2 MG/ML IJ SOLN
INTRAMUSCULAR | Status: DC | PRN
  Administered 2015-02-17: .6 mg via INTRAVENOUS

## 2015-02-17 MED ORDER — DOCUSATE SODIUM 100 MG PO CAPS
100.0000 mg | ORAL_CAPSULE | Freq: Two times a day (BID) | ORAL | Status: DC
  Administered 2015-02-17 – 2015-02-20 (×6): 100 mg via ORAL

## 2015-02-17 MED ORDER — CEFAZOLIN SODIUM-DEXTROSE 2-3 GM-% IV SOLR
2.0000 g | INTRAVENOUS | Status: AC
  Administered 2015-02-17: 2 g via INTRAVENOUS

## 2015-02-17 MED ORDER — HYDROMORPHONE HCL 1 MG/ML IJ SOLN
0.2500 mg | INTRAMUSCULAR | Status: DC | PRN
  Administered 2015-02-17 (×2): 0.5 mg via INTRAVENOUS

## 2015-02-17 MED ORDER — ACETAMINOPHEN 325 MG PO TABS
650.0000 mg | ORAL_TABLET | Freq: Four times a day (QID) | ORAL | Status: DC | PRN
Start: 2015-02-17 — End: 2015-02-20
  Administered 2015-02-18: 650 mg via ORAL
  Filled 2015-02-17: qty 2

## 2015-02-17 MED ORDER — METOCLOPRAMIDE HCL 10 MG PO TABS
5.0000 mg | ORAL_TABLET | Freq: Three times a day (TID) | ORAL | Status: DC | PRN
Start: 2015-02-17 — End: 2015-02-20

## 2015-02-17 MED ORDER — METOPROLOL SUCCINATE ER 25 MG PO TB24
25.0000 mg | ORAL_TABLET | Freq: Every day | ORAL | Status: DC
  Administered 2015-02-17 – 2015-02-19 (×3): 25 mg via ORAL
  Filled 2015-02-17 (×4): qty 1

## 2015-02-17 MED ORDER — CEFAZOLIN SODIUM 1-5 GM-% IV SOLN
1.0000 g | Freq: Four times a day (QID) | INTRAVENOUS | Status: AC
  Administered 2015-02-17 (×2): 1 g via INTRAVENOUS
  Filled 2015-02-17 (×2): qty 50

## 2015-02-17 MED ORDER — ONDANSETRON HCL 4 MG PO TABS: 4.0000 mg | ORAL_TABLET | Freq: Four times a day (QID) | ORAL | Status: DC | PRN

## 2015-02-17 MED ORDER — ZOLPIDEM TARTRATE 5 MG PO TABS: 5.0000 mg | ORAL_TABLET | Freq: Every evening | ORAL | Status: DC | PRN

## 2015-02-17 MED ORDER — PHENOL 1.4 % MT LIQD: 1.0000 | OROMUCOSAL | Status: DC | PRN

## 2015-02-17 MED ORDER — OXYCODONE HCL 5 MG PO TABS
5.0000 mg | ORAL_TABLET | ORAL | Status: DC | PRN
  Filled 2015-02-17: qty 1

## 2015-02-17 MED ORDER — METHOCARBAMOL 500 MG PO TABS
500.0000 mg | ORAL_TABLET | Freq: Four times a day (QID) | ORAL | Status: DC | PRN
  Administered 2015-02-17 – 2015-02-20 (×6): 500 mg via ORAL
  Filled 2015-02-17 (×7): qty 1

## 2015-02-17 MED ORDER — DEXAMETHASONE SODIUM PHOSPHATE 10 MG/ML IJ SOLN
INTRAMUSCULAR | Status: DC | PRN
  Administered 2015-02-17: 10 mg via INTRAVENOUS

## 2015-02-17 MED ORDER — PANTOPRAZOLE SODIUM 40 MG PO TBEC
80.0000 mg | DELAYED_RELEASE_TABLET | Freq: Every day | ORAL | Status: DC
  Administered 2015-02-18 – 2015-02-20 (×3): 80 mg via ORAL
  Filled 2015-02-17 (×4): qty 2

## 2015-02-17 MED ORDER — VALACYCLOVIR HCL 500 MG PO TABS: 1000.0000 mg | ORAL_TABLET | Freq: Three times a day (TID) | ORAL | Status: DC | PRN

## 2015-02-17 MED ORDER — ROCURONIUM BROMIDE 100 MG/10ML IV SOLN
INTRAVENOUS | Status: DC | PRN
  Administered 2015-02-17: 45 mg via INTRAVENOUS
  Administered 2015-02-17: 5 mg via INTRAVENOUS

## 2015-02-17 MED ORDER — ACETAMINOPHEN 650 MG RE SUPP: 650.0000 mg | Freq: Four times a day (QID) | RECTAL | Status: DC | PRN

## 2015-02-17 MED ORDER — DEXAMETHASONE SODIUM PHOSPHATE 10 MG/ML IJ SOLN
INTRAMUSCULAR | Status: AC
  Filled 2015-02-17: qty 1

## 2015-02-17 MED ORDER — HYDROMORPHONE HCL 1 MG/ML IJ SOLN
INTRAMUSCULAR | Status: DC | PRN
  Administered 2015-02-17 (×2): 1 mg via INTRAVENOUS

## 2015-02-17 MED ORDER — POTASSIUM CHLORIDE CRYS ER 20 MEQ PO TBCR
20.0000 meq | EXTENDED_RELEASE_TABLET | Freq: Every day | ORAL | Status: DC
  Administered 2015-02-18 – 2015-02-20 (×3): 20 meq via ORAL
  Filled 2015-02-17 (×3): qty 1

## 2015-02-17 MED ORDER — HYDROMORPHONE HCL 2 MG/ML IJ SOLN
INTRAMUSCULAR | Status: AC
  Filled 2015-02-17: qty 1

## 2015-02-17 MED ORDER — METOCLOPRAMIDE HCL 5 MG/ML IJ SOLN: 5.0000 mg | Freq: Three times a day (TID) | INTRAMUSCULAR | Status: DC | PRN

## 2015-02-17 SURGICAL SUPPLY — 47 items
APL SKNCLS STERI-STRIP NONHPOA (GAUZE/BANDAGES/DRESSINGS)
BAG SPEC THK2 15X12 ZIP CLS (MISCELLANEOUS)
BAG ZIPLOCK 12X15 (MISCELLANEOUS) IMPLANT
BENZOIN TINCTURE PRP APPL 2/3 (GAUZE/BANDAGES/DRESSINGS) IMPLANT
BLADE SAW SGTL 18X1.27X75 (BLADE) ×2 IMPLANT
BLADE SAW SGTL 18X1.27X75MM (BLADE) ×1
CAPT HIP TOTAL 2 ×2 IMPLANT
CELLS DAT CNTRL 66122 CELL SVR (MISCELLANEOUS) ×1 IMPLANT
CLOSURE WOUND 1/2 X4 (GAUZE/BANDAGES/DRESSINGS)
COVER PERINEAL POST (MISCELLANEOUS) ×3 IMPLANT
DRAPE C-ARM 42X120 X-RAY (DRAPES) ×3 IMPLANT
DRAPE STERI IOBAN 125X83 (DRAPES) ×3 IMPLANT
DRAPE U-SHAPE 47X51 STRL (DRAPES) ×9 IMPLANT
DRSG AQUACEL AG ADV 3.5X10 (GAUZE/BANDAGES/DRESSINGS) ×3 IMPLANT
DRSG XEROFORM 1X8 (GAUZE/BANDAGES/DRESSINGS) ×2 IMPLANT
DURAPREP 26ML APPLICATOR (WOUND CARE) ×3 IMPLANT
ELECT BLADE TIP CTD 4 INCH (ELECTRODE) ×3 IMPLANT
ELECT REM PT RETURN 9FT ADLT (ELECTROSURGICAL) ×3
ELECTRODE REM PT RTRN 9FT ADLT (ELECTROSURGICAL) ×1 IMPLANT
FACESHIELD WRAPAROUND (MASK) ×12 IMPLANT
FACESHIELD WRAPAROUND OR TEAM (MASK) ×4 IMPLANT
GAUZE XEROFORM 1X8 LF (GAUZE/BANDAGES/DRESSINGS) IMPLANT
GLOVE BIO SURGEON STRL SZ7.5 (GLOVE) ×3 IMPLANT
GLOVE BIOGEL PI IND STRL 8 (GLOVE) ×2 IMPLANT
GLOVE BIOGEL PI INDICATOR 8 (GLOVE) ×4
GLOVE ECLIPSE 8.0 STRL XLNG CF (GLOVE) ×3 IMPLANT
GOWN STRL REUS W/TWL XL LVL3 (GOWN DISPOSABLE) ×6 IMPLANT
HANDPIECE INTERPULSE COAX TIP (DISPOSABLE) ×3
KIT BASIN OR (CUSTOM PROCEDURE TRAY) ×3 IMPLANT
PACK TOTAL JOINT (CUSTOM PROCEDURE TRAY) ×3 IMPLANT
PEN SKIN MARKING BROAD (MISCELLANEOUS) ×3 IMPLANT
RETRACTOR WND ALEXIS 18 MED (MISCELLANEOUS) ×1 IMPLANT
RTRCTR WOUND ALEXIS 18CM MED (MISCELLANEOUS) ×3
SET HNDPC FAN SPRY TIP SCT (DISPOSABLE) ×1 IMPLANT
STAPLER VISISTAT 35W (STAPLE) IMPLANT
STRIP CLOSURE SKIN 1/2X4 (GAUZE/BANDAGES/DRESSINGS) IMPLANT
SUT ETHIBOND NAB CT1 #1 30IN (SUTURE) ×3 IMPLANT
SUT MNCRL AB 4-0 PS2 18 (SUTURE) IMPLANT
SUT VIC AB 0 CT1 36 (SUTURE) ×3 IMPLANT
SUT VIC AB 1 CT1 36 (SUTURE) ×3 IMPLANT
SUT VIC AB 2-0 CT1 27 (SUTURE) ×9
SUT VIC AB 2-0 CT1 TAPERPNT 27 (SUTURE) ×2 IMPLANT
TOWEL OR 17X26 10 PK STRL BLUE (TOWEL DISPOSABLE) ×3 IMPLANT
TOWEL OR NON WOVEN STRL DISP B (DISPOSABLE) ×3 IMPLANT
TRAY FOLEY W/METER SILVER 14FR (SET/KITS/TRAYS/PACK) ×3 IMPLANT
TRAY FOLEY W/METER SILVER 16FR (SET/KITS/TRAYS/PACK) ×1 IMPLANT
YANKAUER SUCT BULB TIP 10FT TU (MISCELLANEOUS) ×3 IMPLANT

## 2015-02-17 NOTE — Progress Notes (Signed)
Dr. Denenny made aware of patient's EKG readings 

## 2015-02-17 NOTE — Anesthesia Procedure Notes (Signed)
Procedure Name: Intubation Date/Time: 02/17/2015 10:40 AM Performed by: Noralyn Pick D Pre-anesthesia Checklist: Patient identified, Emergency Drugs available, Suction available and Patient being monitored Patient Re-evaluated:Patient Re-evaluated prior to inductionOxygen Delivery Method: Circle System Utilized Preoxygenation: Pre-oxygenation with 100% oxygen Intubation Type: IV induction Ventilation: Mask ventilation without difficulty Laryngoscope Size: Mac and 3 Grade View: Grade III Tube type: Oral Tube size: 7.5 mm Number of attempts: 1 Airway Equipment and Method: Stylet and Oral airway Placement Confirmation: ETT inserted through vocal cords under direct vision,  positive ETCO2 and breath sounds checked- equal and bilateral Secured at: 21 cm Tube secured with: Tape Dental Injury: Teeth and Oropharynx as per pre-operative assessment

## 2015-02-17 NOTE — Op Note (Signed)
[name], [name] NO.:  [id number]  MEDICAL RECORD NO.:  [id number]  LOCATION:  50                         FACILITY:  [facility]  PHYSICIAN:  Lind Guest. 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  PROCEDURE:  Right total hip arthroplasty, direct anterior approach.  IMPLANTS:  DePuy Sector Gription acetabular component size 48, size 32+ 4 neutral polyethylene liner, size 11 Corail femoral component with standard offset, size 32+ 5 ceramic hip ball.  SURGEON:  Lind Guest. Ninfa Linden, M.D.  ASSISTANT:  Erskine Emery, PA-C.  ANESTHESIA:  General.  ANTIBIOTICS:  2 g IV Ancef.  BLOOD LOSS:  100 mL.  COMPLICATIONS:  None.  INDICATIONS:  [name] is a 69 year old female, well known to me.  She has had bilateral hip osteoarthritis and underwent a successful left total hip arthroplasty in April 2014.  She now presents to have her right hip replaced.  She has failed conservative treatment on that hip and then I had even included intra-articular steroid injection.  Her pain is daily, her mobility is limited.  It is affecting her quality of life.  At this point, she understands fully the risks of acute blood loss anemia, nerve and vessel injury, fracture, and fracture dislocation, DVT.  She understands her goals are decreased pain, improved mobility, and overall improved quality of life.  PROCEDURE DESCRIPTION:  After informed consent was obtained, appropriate right hip was marked.  She was brought to the operating room.  General anesthesia was obtained, while she was on her stretcher.  A Foley catheter was placed and traction boots were placed on both of her feet. Next,  she was placed supine on the Hana fracture table with the perineal post in place and both legs inline skeletal traction devices, but no traction applied.  Her right operative hip was then prepped and draped with DuraPrep and sterile drapes.  A time-out was called to identify correct patient, correct right hip.  We then made an incision inferior and posterior to the anterior superior iliac spine.  We carried this obliquely down the leg.  We dissected down tensor fascia lata muscle and tensor fascia was then divided longitudinally, so we could treat it with a direct anterior approach to hip.  We identified cauterized lateral femoral circumflex vessels and identified the hip capsule.  We placed Cobra retractors around lateral femoral neck and up underneath the rectus femoris around the medial femoral neck.  We then opened up the hip capsule in L type format finding large joint effusion.  We found periarticular osteophytes around the femoral head and femoral neck.  We placed the Cobra retractors within the joint capsule.  I made our femoral neck cut proximal to the lesser trochanter with an oscillating saw and completed this on osteotome.  I placed a corkscrew guide in the femoral head and removed the femoral head in its entirety and found to be devoid of cartilage.  I then cleaned  the acetabulum, remnants of the acetabular labrum, and other debris, placed in bed in a Hohmann over the medial acetabular rim and then began reaming from a size 4 to 2 reamer and stepwise, commenced up to a size 48 with all reamers under direct visualization and the last reamer under direct fluoroscopy, so we could obtain our depth of reaming, our inclination, and anteversion.  Once we were pleased with this, we placed the real DePuy Sector Gription acetabular component size 48 and a 32 +4 liner for the acetabular component.  Attention was then turned in the femur, with the leg externally rotated to 100 degrees,  extended and adducted.  We released the lateral joint capsule and used a box cutting osteotome to enter the femoral canal and a rongeur to lateralize.  We then began broaching from a size 8 broach to a size 11 broach, was actually corresponded to her other side.  I used a calcar planer for this and trailed a standard neck and a 32+ 1 hip ball and reduced from the acetabulum.  We were pleased with stability offset, but I felt like we could get her a little bit longer leg length wise.  We dislocated the hip and removed the trial components and then placed the real Corail femoral component with standard offset, size 11 and it is real 32+ 5 ceramic hip ball, reduces this back in the acetabulum.  We were pleased in stability, range of motion, offset, and leg lengths.  We then irrigated the soft tissue with normal saline solution using pulsatile lavage.  Closed the joint capsule with interrupted #1 Ethibond suture followed by running #1 Vicryl in the tensor fascia, 0 Vicryl in deep tissue, 2-0 Vicryl in subcutaneous tissue, and staples on the skin.  Xeroform and well-padded sterile dressing was applied.  She was awakened, extubated, and taken to the recovery room in stable condition.  All final counts were correct. There were no complications noted.  Of note, Erskine Emery, PA-C, assisted the entire case, and his assistance was crucial for facilitating all aspects of this case.     Lind Guest. Ninfa Linden, M.D.     CYB/MEDQ  D:  02/17/2015  T:  02/17/2015  Job:  [id number]

## 2015-02-17 NOTE — Progress Notes (Signed)
Dr. Delma Post  Made aware of patient's etCO2s- O.K. To go to floor

## 2015-02-17 NOTE — Progress Notes (Signed)
Utilization review completed.  

## 2015-02-17 NOTE — Progress Notes (Signed)
X-ray results noted 

## 2015-02-17 NOTE — Progress Notes (Signed)
Portable AP Pelvis and Lateral Right Hip X-rays done. 

## 2015-02-17 NOTE — Anesthesia Preprocedure Evaluation (Addendum)
Anesthesia Evaluation  Patient identified by MRN, date of birth, ID band Patient awake  General Assessment Comment:Arthritis   . Diverticulosis  . GERD (gastroesophageal reflux disease)  . Osteoporosis  . Tuberous sclerosis    EXTERNAL LESIONS-- MAINLY HANDS (INTERNAL SCLEROTIC BONY LESIONS LUMBAR & PELVIS PER CT 11/2013) . Seasonal allergies  . H/O hiatal hernia  . History of diverticulitis of colon  . Nodule of right lung    BENIGN--- AND STABLE PER CT 11/2013 . History of gastric ulcer  . History of epilepsy    childhood age 23 to 53 ---secondary to high calcium deposts of optic nerve--- none since age 46 with pregency . IBS (irritable bowel syndrome)  . History of kidney stones  . History of shingles    MARCH 2014-- BACK AREA . History of vulvar dysplasia    S/P LASER ABLATION 2014 . Right ureteral stone  . Ureteral stenosis, right  . DDD (degenerative disc disease), lumbosacral  . Renal cyst, left    STABLE PER CT 11/2013 . PONV (postoperative nausea and vomiting)    occurred back in 1970's  . Tachycardia  . Mild obstructive sleep apnea    SLEEP STUDY 11-19-2012-- NO CPAP RX (DID NOT MEET CR[facility]IRIA) . Shortness of breath dyspnea    walking distances and climbing stairs . Bronchitis    hx of  . Phlebitis    hx of  . Dysrhythmia  . Anginal pain    hx of pt relates to stress last occurrence 8 to 10 years ago; pt states has now been diagnosed with tachycardia last episode of chest pain 1 month ago  . Pneumonia    hx of twice  . Bronchitis    hx of  . Falls    hx of  . Impaired gait and mobility  . Seizures        Reviewed: Allergy & Precautions, NPO status , Patient's Chart, lab work & pertinent test results  History of Anesthesia Complications (+) PONV and history of  anesthetic complications  Airway Mallampati: II  TM Distance: >3 FB Neck ROM: Full    Dental no notable dental hx.    Pulmonary shortness of breath, sleep apnea , pneumonia, resolved, former smoker,    Pulmonary exam normal breath sounds clear to auscultation       Cardiovascular + angina Normal cardiovascular exam+ dysrhythmias  Rhythm:Regular Rate:Normal     Neuro/Psych Seizures -,  Tuberous Sclerosis. Her neurologist recommends against spinal anesthesia. negative psych ROS   GI/Hepatic Neg liver ROS, hiatal hernia, GERD  ,  Endo/Other  negative endocrine ROS  Renal/GU Renal disease  negative genitourinary   Musculoskeletal  (+) Arthritis ,   Abdominal   Peds negative pediatric ROS (+)  Hematology negative hematology ROS (+)   Anesthesia Other Findings   Reproductive/Obstetrics negative OB ROS                            Anesthesia Physical Anesthesia Plan  ASA: III  Anesthesia Plan: General   Post-op Pain Management:    Induction: Intravenous  Airway Management Planned: Oral ETT  Additional Equipment:   Intra-op Plan:   Post-operative Plan: Extubation in OR  Informed Consent: I have reviewed the patients History and Physical, chart, labs and discussed the procedure including the risks, benefits and alternatives for the proposed anesthesia with the patient or authorized representative who has indicated his/her understanding and acceptance.   Dental advisory given  Plan Discussed with: CRNA  Anesthesia Plan Comments:         Anesthesia Quick Evaluation

## 2015-02-17 NOTE — Progress Notes (Signed)
Dr. Delma Post  In -talked with patient- saw EKG readings on monitor- O.K. To go to floor

## 2015-02-17 NOTE — Brief Op Note (Signed)
02/17/2015  11:55 AM  PATIENT:  [name]  69 y.o. female  PRE-OPERATIVE DIAGNOSIS:  osteoarthritis right hip  POST-OPERATIVE DIAGNOSIS:  osteoarthritis right hip  PROCEDURE:  Procedure(s): RIGHT TOTAL HIP ARTHROPLASTY ANTERIOR APPROACH (Right)  SURGEON:  Surgeon(s) and Role:    * Mcarthur Rossetti, MD - Primary  PHYSICIAN ASSISTANT: Benita Stabile, PA-C  ANESTHESIA:   general  EBL:   100 cc  LOCAL MEDICATIONS USED:  NONE  SPECIMEN:  No Specimen  DISPOSITION OF SPECIMEN:  N/A  COUNTS:  YES  TOURNIQUET:  * No tourniquets in log *  DICTATION: .Other Dictation: Dictation Number [id number]  PLAN OF CARE: Admit to inpatient   PATIENT DISPOSITION:  PACU - hemodynamically stable.   Delay start of Pharmacological VTE agent (>24hrs) due to surgical blood loss or risk of bleeding: no

## 2015-02-17 NOTE — Transfer of Care (Signed)
Immediate Anesthesia Transfer of Care Note  Patient: [name]  Procedure(s) Performed: Procedure(s): RIGHT TOTAL HIP ARTHROPLASTY ANTERIOR APPROACH (Right)  Patient Location: PACU  Anesthesia Type:General  Level of Consciousness: awake, alert , oriented and patient cooperative  Airway & Oxygen Therapy: Patient Spontanous Breathing and Patient connected to face mask oxygen  Post-op Assessment: Report given to RN, Post -op Vital signs reviewed and stable and Patient moving all extremities  Post vital signs: Reviewed and stable  Last Vitals:  Filed Vitals:   02/17/15 0725  BP: 126/88  Pulse: 52  Temp: 36.8 C  Resp: 16    Complications: No apparent anesthesia complications

## 2015-02-17 NOTE — H&P (Signed)
TOTAL HIP ADMISSION H&P  Patient is admitted for right total hip arthroplasty.  Subjective:  Chief Complaint: right hip pain  HPI: [name], 69 y.o. female, has a history of pain and functional disability in the right hip(s) due to arthritis and patient has failed non-surgical conservative treatments for greater than 12 weeks to include NSAID's and/or analgesics, corticosteriod injections, flexibility and strengthening excercises, supervised PT with diminished ADL's post treatment, use of assistive devices, weight reduction as appropriate and activity modification.  Onset of symptoms was gradual starting 2 years ago with gradually worsening course since that time.The patient noted no past surgery on the right hip(s).  Patient currently rates pain in the right hip at 10 out of 10 with activity. Patient has worsening of pain with activity and weight bearing, pain that interfers with activities of daily living and pain with passive range of motion. Patient has evidence of subchondral sclerosis, periarticular osteophytes and joint space narrowing by imaging studies. This condition presents safety issues increasing the risk of falls.  There is no current active infection.  Patient Active Problem List   Diagnosis Date Noted  . Osteoarthritis of right hip 02/17/2015  . Atrial tachycardia 12/12/2014  . 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 Recurrent dislocation of hip 10/17/2014  . Dislocation of internal left hip prosthesis 10/16/2014  . Ectopic atrial rhythm 10/12/2014  . Atrial bigeminy 10/12/2014  . Osteoarthritis of left hip 09/16/2014  . Status post total replacement of left hip 09/16/2014  . ASCUS favoring benign 09/05/2014  . History of vitamin B deficiency 05/02/2014  . Acute bronchitis 04/18/2014  . Oral aphthous ulcer 04/18/2014  . Edema 12/21/2013  . Irregular heart beat 12/21/2013  . Anuria 11/12/2013  . History of cervical dysplasia 09/21/2013  .  Rectocele, female 09/21/2013  . Kidney stone 09/21/2013  . Rash and nonspecific skin eruption 06/25/2013  . Obesity (BMI 30-39.9) 06/16/2013  . IBS (irritable bowel syndrome) 12/22/2012  . Other sleep disturbances 10/27/2012  . Elevated BP 09/29/2012  . Weight gain 08/17/2012  . Osteopenia 08/17/2012  . Vulvar lesion 08/10/2012  . 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 Condyloma acuminatum in female 07/14/2012  . Shingles 07/14/2012  . Vitamin D deficiency 07/14/2012   Past Medical History  Diagnosis Date  . Arthritis   . Diverticulosis   . GERD (gastroesophageal reflux disease)   . Osteoporosis   . Tuberous sclerosis     EXTERNAL LESIONS--  MAINLY HANDS (INTERNAL SCLEROTIC BONY LESIONS LUMBAR & PELVIS PER CT 11/2013)  . Seasonal allergies   . H/O hiatal hernia   . History of diverticulitis of colon   . Nodule of right lung     BENIGN--- AND STABLE PER  CT  11/2013  . History of gastric ulcer   . History of epilepsy     childhood age 52 to 25  ---secondary to high calcium deposts of optic nerve---  none since age 23 with pregency  . IBS (irritable bowel syndrome)   . History of kidney stones   . History of shingles     MARCH 2014--  BACK AREA  . History of vulvar dysplasia     S/P  LASER ABLATION 2014  . Right ureteral stone   . Ureteral stenosis, right   . DDD (degenerative disc disease), lumbosacral   . Renal cyst, left     STABLE PER CT 11/2013  . PONV (postoperative nausea and vomiting)     occurred back in 1970's   .  Tachycardia   . Mild obstructive sleep apnea     SLEEP STUDY  11-19-2012--  NO CPAP RX (DID NOT MEET CRITIRIA)  . Shortness of breath dyspnea     walking distances and climbing stairs  . Bronchitis     hx of   . Phlebitis     hx of   . Dysrhythmia   . Anginal pain     hx of pt relates to stress last occurrence 8 to 10 years ago; pt states has now been diagnosed with tachycardia last episode of chest pain 1 month ago   . Pneumonia     hx of twice    . Bronchitis     hx of   . Falls     hx of   . Impaired gait and mobility   . Seizures     Past Surgical History  Procedure Laterality Date  . Bladder surgery  1991    bladder reconstruction of torsion  . Colonoscopy    . Upper gastrointestinal endoscopy    . Cataract extraction w/ intraocular lens  implant, bilateral    . Cholecystectomy  11-18-2003  . Cardiac catheterization  06-26-2001    NORMAL CORONARY ARTERIES  . Orif right bimalleolar ankle fx  12-18-2003  . 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 Lumbar disc surgery  1985  . Vaginal hysterectomy  1975  . Hemorrhoid surgery  1970's  . Co2 laser application N/A 10/01/256    Procedure: CO2 LASER APPLICATION;  Surgeon: Terrance Mass, MD;  Location: [facility];  Service: Gynecology;  Laterality: N/A;CO2 laser of vaginal and vulvar lesions  . Gynecologic cryosurgery    . Dilation and curettage of uterus  multiple -- last one 1975  . Augmentation mammaplasty      SALINE  . Cystoscopy with retrograde pyelogram, ureteroscopy and stent placement Right 11/22/2013    Procedure: CYSTOSCOPY WITH RETROGRADE PYELOGRAM, URETEROSCOPY AND STENT PLACEMENT;  Surgeon: Sharyn Creamer, MD;  Location: [facility];  Service: Urology;  Laterality: Right;  . Cystoscopy/retrograde/ureteroscopy Right 12/01/2013    Procedure: CYSTOSCOPY, RIGHT RETROGRADE/RIGHT DIGITAL URETEROSCOPY, RIGHT URETERAL STENT EXCHANGE;  Surgeon: Sharyn Creamer, MD;  Location: [facility];  Service: Urology;  Laterality: Right;  STAGED RIGHT URETEROSCOPY RIGHT URETER DILATION    . Holmium laser application Right 10/02/7780    Procedure: HOLMIUM LASER APPLICATION;  Surgeon: Sharyn Creamer, MD;  Location: [facility];  Service: Urology;  Laterality: Right;  . Total hip arthroplasty Left 09/16/2014    Procedure: LEFT TOTAL HIP ARTHROPLASTY ANTERIOR APPROACH;   Surgeon: Mcarthur Rossetti, MD;  Location: [facility];  Service: Orthopedics;  Laterality: Left;  . Hip closed reduction Left 10/16/2014    Procedure: CLOSED MANIPULATION HIP;  Surgeon: Mcarthur Rossetti, MD;  Location: [facility];  Service: Orthopedics;  Laterality: Left;  . Anterior hip revision Left 10/18/2014    Procedure: EXCHANGE LEFT HIP BALL OF DIRECT ANTERIOR TOTAL HIP ARTHROPLASTY;  Surgeon: Mcarthur Rossetti, MD;  Location: [facility];  Service: Orthopedics;  Laterality: Left;    No prescriptions prior to admission   Allergies  Allergen Reactions  . Other     All anti-depressants pt states makes her feel as if her body is shutting down.  Marland Kitchen Zoloft [Sertraline Hcl]     Pt can not take SSRI - nausea and rash   . Percocet [Oxycodone-Acetaminophen] Nausea  And Vomiting  . Contrast Media [Iodinated Diagnostic Agents] Rash    States was oral contrast- not betadine based IV contrast    Social History  Substance Use Topics  . Smoking status: Former Smoker -- 2.00 packs/day for 25 years    Types: Cigarettes    Quit date: 06/03/1998  . Smokeless tobacco: Never Used  . Alcohol Use: Yes     Comment: rarely has a glass of wine    Family History  Problem Relation Age of Onset  . Heart disease Mother   . Tuberous sclerosis Mother   . Tuberous sclerosis Sister   . Tuberous sclerosis Daughter   . Tuberous sclerosis Son     2 sons  . Tuberous sclerosis Maternal Aunt   . Tuberous sclerosis Maternal Grandmother   . Tuberous sclerosis Maternal Aunt   . Tuberous sclerosis Maternal Aunt   . Tuberous sclerosis Cousin   . Uterine cancer Mother      Review of Systems  Musculoskeletal: Positive for joint pain.  All other systems reviewed and are negative.   Objective:  Physical Exam  Constitutional: She is oriented to person, place, and time. She appears well-developed and well-nourished.  HENT:  Head: Normocephalic and atraumatic.  Eyes: EOM are normal. Pupils are equal, round,  and reactive to light.  Neck: Normal range of motion. Neck supple.  Cardiovascular: Normal rate and regular rhythm.   Respiratory: Effort normal and breath sounds normal.  GI: Soft. Bowel sounds are normal.  Musculoskeletal:       Right hip: She exhibits decreased range of motion, decreased strength, tenderness and bony tenderness.  Neurological: She is alert and oriented to person, place, and time.  Skin: Skin is warm and dry.  Psychiatric: She has a normal mood and affect.    Vital signs in last 24 hours:    Labs:   Estimated body mass index is 35.62 kg/(m^2) as calculated from the following:   Height as of 12/12/14: 5\' 2"  (1.575 m).   Weight as of 12/12/14: 88.361 kg (194 lb 12.8 oz).   Imaging Review Plain radiographs demonstrate severe degenerative joint disease of the right hip(s). The bone quality appears to be good for age and reported activity level.  Assessment/Plan:  End stage arthritis, right hip(s)  The patient history, physical examination, clinical judgement of the Nasteho Glantz and imaging studies are consistent with end stage degenerative joint disease of the right hip(s) and total hip arthroplasty is deemed medically necessary. The treatment options including medical management, injection therapy, arthroscopy and arthroplasty were discussed at length. The risks and benefits of total hip arthroplasty were presented and reviewed. The risks due to aseptic loosening, infection, stiffness, dislocation/subluxation,  thromboembolic complications and other imponderables were discussed.  The patient acknowledged the explanation, agreed to proceed with the plan and consent was signed. Patient is being admitted for inpatient treatment for surgery, pain control, PT, OT, prophylactic antibiotics, VTE prophylaxis, progressive ambulation and ADL's and discharge planning.The patient is planning to be discharged home with home health services

## 2015-02-17 NOTE — Anesthesia Postprocedure Evaluation (Addendum)
  Anesthesia Post-op Note  Patient: [name]  Procedure(s) Performed: Procedure(s) (LRB): RIGHT TOTAL HIP ARTHROPLASTY ANTERIOR APPROACH (Right)  Patient Location: PACU  Anesthesia Type: General  Level of Consciousness: awake and alert   Airway and Oxygen Therapy: Patient Spontanous Breathing  Post-op Pain: mild  Post-op Assessment: Post-op Vital signs reviewed, Patient's Cardiovascular Status Stable, Respiratory Function Stable, Patent Airway and No signs of Nausea or vomiting. Her rhythm is somewhat irregular but with frequent P waves seen. She has a h/o irregular rhythm and ectopic atrial pacemaker. H/O mild OSA with no CPAP required. Orders for floor pulse oximetry and ETCO2 monitoring.  Last Vitals:  Filed Vitals:   02/17/15 1300  BP: 138/57  Pulse: 59  Temp:   Resp: 10    Post-op Vital Signs: stable   Complications: No apparent anesthesia complications

## 2015-02-17 NOTE — Plan of Care (Signed)
Problem: Consults Goal: Diagnosis- Total Joint Replacement Right anterior hip     

## 2015-02-18 LAB — CBC
HCT: 29.6 % — ABNORMAL LOW (ref 36.0–46.0)
Hemoglobin: 9.5 g/dL — ABNORMAL LOW (ref 12.0–15.0)
MCH: 25.6 pg — AB (ref 26.0–34.0)
MCHC: 32.1 g/dL (ref 30.0–36.0)
MCV: 79.8 fL (ref 78.0–100.0)
PLATELETS: 229 10*3/uL (ref 150–400)
RBC: 3.71 MIL/uL — AB (ref 3.87–5.11)
RDW: 15 % (ref 11.5–15.5)
WBC: 10.5 10*3/uL (ref 4.0–10.5)

## 2015-02-18 LAB — BASIC METABOLIC PANEL
Anion gap: 5 (ref 5–15)
BUN: 9 mg/dL (ref 6–20)
CALCIUM: 7.5 mg/dL — AB (ref 8.9–10.3)
CO2: 22 mmol/L (ref 22–32)
Chloride: 109 mmol/L (ref 101–111)
Creatinine, Ser: 0.49 mg/dL (ref 0.44–1.00)
GFR calc Af Amer: >60 mL/min (ref >60)
Glucose, Bld: 132 mg/dL — ABNORMAL HIGH (ref 65–99)
POTASSIUM: 3.9 mmol/L (ref 3.5–5.1)
SODIUM: 136 mmol/L (ref 135–145)

## 2015-02-18 MED ORDER — LORAZEPAM 1 MG PO TABS
1.0000 mg | ORAL_TABLET | Freq: Once | ORAL | Status: AC
  Administered 2015-02-18: 1 mg via ORAL
  Filled 2015-02-18: qty 1

## 2015-02-18 NOTE — Progress Notes (Signed)
   Subjective:  Patient reports pain as marked.    Objective:   VITALS:   Filed Vitals:   02/17/15 1753 02/17/15 2238 02/18/15 0215 02/18/15 0607  BP: 125/67 139/65 117/57 105/51  Pulse: 84 87 82 88  Temp: 98.4 F (36.9 C) 97.5 F (36.4 C) 98.6 F (37 C) 98.2 F (36.8 C)  TempSrc: Oral Oral Oral Oral  Resp: 15 20 18 16   Height:      Weight:      SpO2: 99% 99% 100% 97%    Neurologically intact Neurovascular intact Sensation intact distally Intact pulses distally Dorsiflexion/Plantar flexion intact Incision: dressing C/D/I and no drainage No cellulitis present Compartment soft   Lab Results  Component Value Date   WBC 10.5 02/18/2015   HGB 9.5* 02/18/2015   HCT 29.6* 02/18/2015   MCV 79.8 02/18/2015   PLT 229 02/18/2015     Assessment/Plan:  1 Day Post-Op   - Expected postop acute blood loss anemia - will monitor for symptoms - Up with PT/OT - DVT ppx - SCDs, ambulation, asa - WBAT operative extremity - Pain control - Discharge planning  Marianna Payment 02/18/2015, 8:38 AM [phone]

## 2015-02-18 NOTE — Evaluation (Signed)
Physical Therapy Evaluation Patient Details Name: [name] MRN: [id number] DOB: 12-26-1945 Today's Date: 02/18/2015   History of Present Illness  R THR - s/p L THR 4/16  Clinical Impression  Pt s/p R THR presents with decreased R LE strength/ROM and post op pain limiting functional mobility.  Pt would benefit from follow up rehab at SNF level to maximize IND and safety prior to return home alone.    Follow Up Recommendations SNF    Equipment Recommendations  None recommended by PT    Recommendations for Other Services OT consult     Precautions / Restrictions Precautions Precautions: Fall Restrictions Weight Bearing Restrictions: No Other Position/Activity Restrictions: WBAT      Mobility  Bed Mobility Overal bed mobility: Needs Assistance Bed Mobility: Supine to Sit     Supine to sit: Min assist;Mod assist     General bed mobility comments: cues for sequence and use of L LE to self assist  Transfers Overall transfer level: Needs assistance Equipment used: Rolling walker (2 wheeled) Transfers: Sit to/from Stand Sit to Stand: Min assist;Mod assist;From elevated surface         General transfer comment: cues for LE management and use of UEs to self assist  Ambulation/Gait Ambulation/Gait assistance: Min assist Ambulation Distance (Feet): 45 Feet Assistive device: Rolling walker (2 wheeled) Gait Pattern/deviations: Decreased step length - right;Decreased step length - left;Step-to pattern;Step-through pattern;Shuffle;Trunk flexed Gait velocity: decr   General Gait Details: cues for posture, position from RW and initial sequence  Stairs            Wheelchair Mobility    Modified Rankin (Stroke Patients Only)       Balance                                             Pertinent Vitals/Pain Pain Assessment: 0-10 Pain Score: 6  Pain Location: R hip and R shin Pain Descriptors / Indicators: Aching;Sore Pain  Intervention(s): Limited activity within patient's tolerance;Monitored during session;Premedicated before session;Ice applied    Home Living Family/patient expects to be discharged to:: Skilled nursing facility Living Arrangements: Alone                    Prior Function Level of Independence: Independent               Hand Dominance   Dominant Hand: Right    Extremity/Trunk Assessment   Upper Extremity Assessment: Overall WFL for tasks assessed           Lower Extremity Assessment: Overall WFL for tasks assessed      Cervical / Trunk Assessment: Normal  Communication   Communication: No difficulties  Cognition Arousal/Alertness: Awake/alert Behavior During Therapy: WFL for tasks assessed/performed Overall Cognitive Status: Within Functional Limits for tasks assessed                      General Comments      Exercises Total Joint Exercises Ankle Circles/Pumps: AROM;Both;15 reps;Supine Quad Sets: AROM;Both;10 reps;Supine Heel Slides: AAROM;Right;20 reps;Supine Hip ABduction/ADduction: AAROM;Right;Supine;15 reps      Assessment/Plan    PT Assessment Patient needs continued PT services  PT Diagnosis Difficulty walking   PT Problem List Decreased strength;Decreased range of motion;Decreased activity tolerance;Decreased mobility;Decreased knowledge of use of DME;Pain  PT Treatment Interventions DME instruction;Gait training;Stair training;Functional mobility training;Therapeutic activities;Therapeutic exercise;Patient/family  education   PT Goals (Current goals can be found in the Care Plan section) Acute Rehab PT Goals Patient Stated Goal: Walk without pain PT Goal Formulation: With patient Time For Goal Achievement: 02/22/15 Potential to Achieve Goals: Good    Frequency 7X/week   Barriers to discharge        Co-evaluation               End of Session Equipment Utilized During Treatment: Gait belt Activity Tolerance: Patient  tolerated treatment well;Patient limited by pain Patient left: in chair;with call bell/phone within reach Nurse Communication: Mobility status         Time: 0824-0902 PT Time Calculation (min) (ACUTE ONLY): 38 min   Charges:   PT Evaluation $Initial PT Evaluation Tier I: 1 Procedure PT Treatments $Gait Training: 8-22 mins $Therapeutic Exercise: 8-22 mins   PT G Codes:        BRADSHAW,HUNTER 02/24/2015, 9:15 AM

## 2015-02-18 NOTE — Progress Notes (Signed)
OT Cancellation Note  Patient Details Name: [name] MRN: [id number] DOB: 12-01-1945   Cancelled Treatment:    Reason Eval/Treat Not Completed: Other (comment).  Noted pt plans SNF for rehab.  Will defer OT to that venue.  Gabryella Murfin 02/18/2015, 12:42 PM  Lesle Chris, OTR/L [phone] 02/18/2015

## 2015-02-18 NOTE — Progress Notes (Signed)
Physical Therapy Treatment Patient Details Name: [name] MRN: [id number] DOB: April 08, 1946 Today's Date: 02-28-2015    History of Present Illness R THR - s/p L THR 4/16    PT Comments    Progressing well with mobility.   Follow Up Recommendations  SNF     Equipment Recommendations  None recommended by PT    Recommendations for Other Services OT consult     Precautions / Restrictions Precautions Precautions: Fall Restrictions Weight Bearing Restrictions: No Other Position/Activity Restrictions: WBAT    Mobility  Bed Mobility               General bed mobility comments: Pt declines back to bed  Transfers Overall transfer level: Needs assistance Equipment used: Rolling walker (2 wheeled) Transfers: Sit to/from Stand Sit to Stand: Min assist         General transfer comment: cues for LE management and use of UEs to self assist  Ambulation/Gait Ambulation/Gait assistance: Min assist;Min guard Ambulation Distance (Feet): 130 Feet Assistive device: Rolling walker (2 wheeled) Gait Pattern/deviations: Step-to pattern;Step-through pattern;Decreased step length - right;Decreased step length - left;Shuffle;Trunk flexed Gait velocity: decr   General Gait Details: cues for posture, position from RW and initial sequence   Stairs            Wheelchair Mobility    Modified Rankin (Stroke Patients Only)       Balance                                    Cognition Arousal/Alertness: Awake/alert Behavior During Therapy: WFL for tasks assessed/performed Overall Cognitive Status: Within Functional Limits for tasks assessed                      Exercises      General Comments        Pertinent Vitals/Pain Pain Assessment: 0-10 Pain Score: 6  Pain Location: R shin and hip Pain Descriptors / Indicators: Aching;Sore Pain Intervention(s): Limited activity within patient's tolerance;Monitored during session;Premedicated  before session    Home Living                      Prior Function            PT Goals (current goals can now be found in the care plan section) Acute Rehab PT Goals Patient Stated Goal: Walk without pain PT Goal Formulation: With patient Time For Goal Achievement: 02/22/15 Potential to Achieve Goals: Good Progress towards PT goals: Progressing toward goals    Frequency  7X/week    PT Plan Current plan remains appropriate    Co-evaluation             End of Session Equipment Utilized During Treatment: Gait belt Activity Tolerance: Patient tolerated treatment well Patient left: in chair;with call bell/phone within reach     Time: 1452-1506 PT Time Calculation (min) (ACUTE ONLY): 14 min  Charges:  $Gait Training: 8-22 mins                    G Codes:      BRADSHAW,HUNTER 28-Feb-2015, 3:54 PM

## 2015-02-18 NOTE — Discharge Instructions (Signed)

## 2015-02-19 LAB — CBC
HEMATOCRIT: 30.8 % — AB (ref 36.0–46.0)
HEMOGLOBIN: 9.9 g/dL — AB (ref 12.0–15.0)
MCH: 25.7 pg — AB (ref 26.0–34.0)
MCHC: 32.1 g/dL (ref 30.0–36.0)
MCV: 80 fL (ref 78.0–100.0)
Platelets: 223 10*3/uL (ref 150–400)
RBC: 3.85 MIL/uL — ABNORMAL LOW (ref 3.87–5.11)
RDW: 15.3 % (ref 11.5–15.5)
WBC: 8.3 10*3/uL (ref 4.0–10.5)

## 2015-02-19 MED ORDER — LORAZEPAM 1 MG PO TABS
1.0000 mg | ORAL_TABLET | Freq: Once | ORAL | Status: AC
  Administered 2015-02-19: 1 mg via ORAL
  Filled 2015-02-19: qty 1

## 2015-02-19 NOTE — Plan of Care (Signed)
Problem: Phase III Progression Outcomes Goal: Anticoagulant follow-up in place Outcome: Not Applicable Date Met:  54/65/03 asa

## 2015-02-19 NOTE — Progress Notes (Signed)
   Subjective:  Patient reports pain as improved.  Objective:   VITALS:   Filed Vitals:   02/18/15 0900 02/18/15 1400 02/18/15 2100 02/19/15 0514  BP: 112/62 119/55 130/48 134/83  Pulse:  63 97 67  Temp: 98.1 F (36.7 C) 98.4 F (36.9 C) 98.4 F (36.9 C) 98.4 F (36.9 C)  TempSrc: Oral Oral Oral Oral  Resp: 18 20 16 17   Height:      Weight:      SpO2: 99% 98% 100% 98%    Neurologically intact Neurovascular intact Sensation intact distally Intact pulses distally Dorsiflexion/Plantar flexion intact Incision: dressing C/D/I and no drainage No cellulitis present Compartment soft   Lab Results  Component Value Date   WBC 8.3 02/19/2015   HGB 9.9* 02/19/2015   HCT 30.8* 02/19/2015   MCV 80.0 02/19/2015   PLT 223 02/19/2015     Assessment/Plan:  2 Days Post-Op   - Expected postop acute blood loss anemia - will monitor for symptoms - Up with PT/OT - DVT ppx - SCDs, ambulation, asa - WBAT operative extremity - Pain control - Discharge planning - SNF tomorrow  Marianna Payment 02/19/2015, 1:18 PM [phone]

## 2015-02-19 NOTE — Progress Notes (Signed)
Physical Therapy Treatment Patient Details Name: [name] MRN: [id number] DOB: [birth year] Today's Date: 02/19/2015    History of Present Illness R THR - s/p L THR 4/16, h/o B ankle fxs    PT Comments    Pt progressing well with mobility, she walked 50' with RW, distance limited by RLE pain/fatigue. Performed THA exercises with min assist.   Follow Up Recommendations  SNF     Equipment Recommendations  None recommended by PT    Recommendations for Other Services OT consult     Precautions / Restrictions Precautions Precautions: Fall Restrictions Weight Bearing Restrictions: No Other Position/Activity Restrictions: WBAT    Mobility  Bed Mobility               General bed mobility comments: Pt declines back to bed  Transfers Overall transfer level: Needs assistance Equipment used: Rolling walker (2 wheeled) Transfers: Sit to/from Stand Sit to Stand: Supervision         General transfer comment: cues for LE management and use of UEs to self assist  Ambulation/Gait   Ambulation Distance (Feet): 170 Feet Assistive device: Rolling walker (2 wheeled) Gait Pattern/deviations: Step-through pattern;Decreased step length - right;Decreased step length - left Gait velocity: decr   General Gait Details: good sequencing and posture, distance limited by RLE fatigue   Stairs            Wheelchair Mobility    Modified Rankin (Stroke Patients Only)       Balance                                    Cognition Arousal/Alertness: Awake/alert Behavior During Therapy: WFL for tasks assessed/performed Overall Cognitive Status: Within Functional Limits for tasks assessed                      Exercises Total Joint Exercises Ankle Circles/Pumps: AROM;Both;15 reps;Supine Short Arc Quad: AROM;Right;15 reps;Supine Heel Slides: AAROM;Right;Supine;15 reps Hip ABduction/ADduction: AAROM;Right;Supine;10 reps Long Arc Quad:  AROM;Right;10 reps;Seated    General Comments        Pertinent Vitals/Pain Pain Score: 8  Pain Location: R hip and posterior knee Pain Descriptors / Indicators: Aching Pain Intervention(s): Monitored during session;Premedicated before session;Limited activity within patient's tolerance;Heat applied    Home Living                      Prior Function            PT Goals (current goals can now be found in the care plan section) Acute Rehab PT Goals Patient Stated Goal: Walk without pain, travel to [place] to visit her son, work in garden PT Goal Formulation: With patient Time For Goal Achievement: 02/22/15 Potential to Achieve Goals: Good Progress towards PT goals: Progressing toward goals    Frequency  7X/week    PT Plan Current plan remains appropriate    Co-evaluation             End of Session Equipment Utilized During Treatment: Gait belt Activity Tolerance: Patient tolerated treatment well Patient left: in chair;with call bell/phone within reach     Time: 0911-0934 PT Time Calculation (min) (ACUTE ONLY): 23 min  Charges:  $Gait Training: 8-22 mins $Therapeutic Exercise: 8-22 mins                    G Codes:  Blondell Reveal Kistler 02/19/2015, 9:39 AM [phone]

## 2015-02-19 NOTE — Clinical Social Work Note (Signed)
Clinical Social Work Assessment  Patient Details  Name: BRAELIN BROSCH MRN: [id number] Date of Birth: Dec 06, 1945  Date of referral:  02/19/15               Reason for consult:  Facility Placement                Permission sought to share information with:    Permission granted to share information::  Yes, Verbal Permission Granted  Name::        Agency::     Relationship::     Contact Information:     Housing/Transportation Living arrangements for the past 2 months:  Single Family Home Source of Information:  Patient Patient Interpreter Needed:  None Criminal Activity/Legal Involvement Pertinent to Current Situation/Hospitalization:    Significant Relationships:  Adult Children Lives with:  Self Do you feel safe going back to the place where you live?    Need for family participation in patient care:  No (Coment)  Care giving concerns:  No caregiver   Facilities manager / plan:  CSW met with pt at bedside to discuss discharge plans.  CSW provided information regarding role and SNF protocol.  CSW encourged pt to discuss her needs and also her feelings related to rehab.  CSW provided supportive listening and will send pt information to SNF's in [place] although pt wants to go to [facility]  Employment status:  Retired Forensic scientist:  Medicare PT Recommendations:  [place] / Referral to community resources:     Patient/Family's Response to care:  Pt discussed her left hip was replaced in April of this year and she went to [facility]  Pt stated that she lives alone, her children lives out of state so SNF is her choice.  She is hoping to be able to travel after she is finished with her rehab.    Patient/Family's Understanding of and Emotional Response to Diagnosis, Current Treatment, and Prognosis:  Pt has gone through rehab this year when her other hip was replaced.  She is hopeful to get back to a social life and travel after she has  completed rehab.   Emotional Assessment Appearance:  Appears younger than stated age Attitude/Demeanor/Rapport:   (pleasant) Affect (typically observed):  Accepting, Hopeful Orientation:  Oriented to Self, Oriented to Place, Oriented to Situation, Oriented to  Time Alcohol / Substance use:    Psych involvement (Current and /or in the community):  No (Comment)  Discharge Needs  Concerns to be addressed:    Readmission within the last 30 days:    Current discharge risk:    Barriers to Discharge:  No Barriers Identified   Carlean Jews, LCSW 02/19/2015, 12:46 PM

## 2015-02-19 NOTE — Progress Notes (Signed)
Physical Therapy Treatment Patient Details Name: [name] MRN: [id number] DOB: 08-Sep-1945 Today's Date: 02/19/2015    History of Present Illness R THR - s/p L THR 4/16, h/o B ankle fxs    PT Comments    Pt progressing well with mobility. Decreased pain with walking this afternoon.   Follow Up Recommendations  SNF     Equipment Recommendations  None recommended by PT    Recommendations for Other Services OT consult     Precautions / Restrictions Precautions Precautions: Fall Restrictions Weight Bearing Restrictions: No Other Position/Activity Restrictions: WBAT    Mobility  Bed Mobility Overal bed mobility: Needs Assistance Bed Mobility: Sit to Supine     Supine to sit: Min assist     General bed mobility comments: min A for BLEs into bed  Transfers Overall transfer level: Needs assistance Equipment used: Rolling walker (2 wheeled)   Sit to Stand: Supervision         General transfer comment: cues for LE management and use of UEs to self assist  Ambulation/Gait   Ambulation Distance (Feet): 240 Feet Assistive device: Rolling walker (2 wheeled) Gait Pattern/deviations: Step-through pattern Gait velocity: decr   General Gait Details: good sequencing and posture, distance limited by RLE fatigue   Stairs            Wheelchair Mobility    Modified Rankin (Stroke Patients Only)       Balance                                    Cognition Arousal/Alertness: Awake/alert Behavior During Therapy: WFL for tasks assessed/performed Overall Cognitive Status: Within Functional Limits for tasks assessed                      Exercises Total Joint Exercises Ankle Circles/Pumps: AROM;Both;15 reps;Supine Quad Sets: AROM;Both;10 reps;Supine    General Comments        Pertinent Vitals/Pain Pain Score: 4  Pain Location: R hip Pain Descriptors / Indicators: Aching Pain Intervention(s): Monitored during  session;Premedicated before session;Limited activity within patient's tolerance    Home Living                      Prior Function            PT Goals (current goals can now be found in the care plan section) Acute Rehab PT Goals Patient Stated Goal: Walk without pain, travel to [place] to visit her son, work in garden PT Goal Formulation: With patient Time For Goal Achievement: 02/22/15 Potential to Achieve Goals: Good Progress towards PT goals: Progressing toward goals    Frequency  7X/week    PT Plan Current plan remains appropriate    Co-evaluation             End of Session Equipment Utilized During Treatment: Gait belt Activity Tolerance: Patient tolerated treatment well Patient left: with call bell/phone within reach;in bed     Time: 6712-4580 PT Time Calculation (min) (ACUTE ONLY): 21 min  Charges:  $Gait Training: 8-22 mins                    G Codes:      [name] 02/19/2015, 2:18 PM [phone]

## 2015-02-20 DIAGNOSIS — Z9181 History of falling: Secondary | ICD-10-CM | POA: Diagnosis not present

## 2015-02-20 DIAGNOSIS — Z96641 Presence of right artificial hip joint: Secondary | ICD-10-CM | POA: Diagnosis not present

## 2015-02-20 DIAGNOSIS — R29898 Other symptoms and signs involving the musculoskeletal system: Secondary | ICD-10-CM | POA: Diagnosis not present

## 2015-02-20 DIAGNOSIS — M5137 Other intervertebral disc degeneration, lumbosacral region: Secondary | ICD-10-CM | POA: Diagnosis not present

## 2015-02-20 DIAGNOSIS — Q851 Tuberous sclerosis: Secondary | ICD-10-CM | POA: Diagnosis not present

## 2015-02-20 DIAGNOSIS — Z471 Aftercare following joint replacement surgery: Secondary | ICD-10-CM | POA: Diagnosis not present

## 2015-02-20 DIAGNOSIS — M810 Age-related osteoporosis without current pathological fracture: Secondary | ICD-10-CM | POA: Diagnosis not present

## 2015-02-20 DIAGNOSIS — M160 Bilateral primary osteoarthritis of hip: Secondary | ICD-10-CM | POA: Diagnosis not present

## 2015-02-20 DIAGNOSIS — R29818 Other symptoms and signs involving the nervous system: Secondary | ICD-10-CM | POA: Diagnosis not present

## 2015-02-20 DIAGNOSIS — M1990 Unspecified osteoarthritis, unspecified site: Secondary | ICD-10-CM | POA: Diagnosis not present

## 2015-02-20 DIAGNOSIS — R2689 Other abnormalities of gait and mobility: Secondary | ICD-10-CM | POA: Diagnosis not present

## 2015-02-20 DIAGNOSIS — D649 Anemia, unspecified: Secondary | ICD-10-CM | POA: Diagnosis not present

## 2015-02-20 DIAGNOSIS — K5790 Diverticulosis of intestine, part unspecified, without perforation or abscess without bleeding: Secondary | ICD-10-CM | POA: Diagnosis not present

## 2015-02-20 DIAGNOSIS — J302 Other seasonal allergic rhinitis: Secondary | ICD-10-CM | POA: Diagnosis not present

## 2015-02-20 DIAGNOSIS — K589 Irritable bowel syndrome without diarrhea: Secondary | ICD-10-CM | POA: Diagnosis not present

## 2015-02-20 DIAGNOSIS — K219 Gastro-esophageal reflux disease without esophagitis: Secondary | ICD-10-CM | POA: Diagnosis not present

## 2015-02-20 DIAGNOSIS — M1611 Unilateral primary osteoarthritis, right hip: Secondary | ICD-10-CM | POA: Diagnosis not present

## 2015-02-20 LAB — CBC
HEMATOCRIT: 32.3 % — AB (ref 36.0–46.0)
HEMOGLOBIN: 10.4 g/dL — AB (ref 12.0–15.0)
MCH: 25.8 pg — AB (ref 26.0–34.0)
MCHC: 32.2 g/dL (ref 30.0–36.0)
MCV: 80.1 fL (ref 78.0–100.0)
Platelets: 210 10*3/uL (ref 150–400)
RBC: 4.03 MIL/uL (ref 3.87–5.11)
RDW: 15.6 % — AB (ref 11.5–15.5)
WBC: 7 10*3/uL (ref 4.0–10.5)

## 2015-02-20 MED ORDER — METHOCARBAMOL 500 MG PO TABS: 500.0000 mg | ORAL_TABLET | Freq: Four times a day (QID) | ORAL | Status: DC | PRN

## 2015-02-20 MED ORDER — ASPIRIN 325 MG PO TBEC: 325.0000 mg | DELAYED_RELEASE_TABLET | Freq: Two times a day (BID) | ORAL | Status: DC

## 2015-02-20 MED ORDER — HYDROCODONE-ACETAMINOPHEN 5-325 MG PO TABS: 1.0000 | ORAL_TABLET | ORAL | Status: DC | PRN

## 2015-02-20 NOTE — Clinical Social Work Placement (Signed)
   CLINICAL SOCIAL WORK PLACEMENT  NOTE  Date:  02/20/2015  Patient Details  Name: [name] MRN: [id number] Date of Birth: 29-Oct-1945  Clinical Social Work is seeking post-discharge placement for this patient at the [place] level of care (*CSW will initial, date and re-position this form in  chart as items are completed):  Yes   Patient/family provided with [facility] Work Department's list of facilities offering this level of care within the geographic area requested by the patient (or if unable, by the patient's family).  Yes   Patient/family informed of their freedom to choose among providers that offer the needed level of care, that participate in Medicare, Medicaid or managed care program needed by the patient, have an available bed and are willing to accept the patient.  Yes   Patient/family informed of [facility]'s ownership interest in [facility] and [facility], as well as of the fact that they are under no obligation to receive care at these facilities.  PASRR submitted to EDS on       PASRR number received on       Existing PASRR number confirmed on 02/19/15     FL2 transmitted to all facilities in geographic area requested by pt/family on 02/19/15     FL2 transmitted to all facilities within larger geographic area on       Patient informed that his/her managed care company has contracts with or will negotiate with certain facilities, including the following:            Patient/family informed of bed offers received.  Patient chooses bed at  [facility])     Physician recommends and patient chooses bed at      Patient to be transferred to [facility] [place] on 02/20/15.  Patient to be transferred to facility by       Patient family notified on 02/20/15 of transfer.  Name of family member notified:  Pt contacted her friend directly.     PHYSICIAN       Additional Comment: Pt is in agreement with d/c to  [facility]  today. PT approved transport by car. NSG reviewed d/c summary, scripts, avs. Scripts included in d/c packet. D/c summary sent to SNF for review prior to d/c. D/C packet provided to pt prior to d/c.    _______________________________________________ Luretha Rued, LCSW  [phone] 02/20/2015, 1:40 PM

## 2015-02-20 NOTE — Care Management Note (Signed)
Case Management Note  Patient Details  Name: KYSA CALAIS MRN: [id number] Date of Birth: 04-27-1946  Subjective/Objective:         Right total hip arthroplasty, direct anterior approach           Action/Plan: Discharge planning per CSW  Expected Discharge Date:                  Expected Discharge Plan:  Skilled Nursing Facility  In-House Referral:  Clinical Social Work  Discharge planning Services  CM Consult  Post Acute Care Choice:  NA Choice offered to:  NA  DME Arranged:  N/A DME Agency:  NA  HH Arranged:  NA HH Agency:  NA  Status of Service:  Completed, signed off  Medicare Important Message Given:    Date Medicare IM Given:    Medicare IM give by:    Date Additional Medicare IM Given:    Additional Medicare Important Message give by:     If discussed at [place] of Stay Meetings, dates discussed:    Additional Comments:  Guadalupe Maple, RN 02/20/2015, 10:45 AM

## 2015-02-20 NOTE — Progress Notes (Signed)
Subjective: 3 Days Post-Op Procedure(s) (LRB): RIGHT TOTAL HIP ARTHROPLASTY ANTERIOR APPROACH (Right) Patient reports pain as moderate.  Doing well overall.  Asymptomatic acute blood loss anemia.  Objective: Vital signs in last 24 hours: Temp:  [98.2 F (36.8 C)-98.7 F (37.1 C)] 98.5 F (36.9 C) (09/19 7169) Pulse Rate:  [63-84] 78 (09/19 0613) Resp:  [16] 16 (09/19 0613) BP: (110-130)/(51-54) 110/51 mmHg (09/19 0613) SpO2:  [98 %-100 %] 98 % (09/19 0613)  Intake/Output from previous day: 09/18 0701 - 09/19 0700 In: 1200 [P.O.:1200] Out: 1400 [Urine:1400] Intake/Output this shift:     Recent Labs  02/18/15 0445 02/19/15 0500 02/20/15 0515  HGB 9.5* 9.9* 10.4*    Recent Labs  02/19/15 0500 02/20/15 0515  WBC 8.3 7.0  RBC 3.85* 4.03  HCT 30.8* 32.3*  PLT 223 210    Recent Labs  02/18/15 0445  NA 136  K 3.9  CL 109  CO2 22  BUN 9  CREATININE 0.49  GLUCOSE 132*  CALCIUM 7.5*   No results for input(s): LABPT, INR in the last 72 hours.  Sensation intact distally Intact pulses distally Dorsiflexion/Plantar flexion intact Incision: dressing C/D/I  Assessment/Plan: 3 Days Post-Op Procedure(s) (LRB): RIGHT TOTAL HIP ARTHROPLASTY ANTERIOR APPROACH (Right) Up with therapy Discharge to SNF today.  BLACKMAN,CHRISTOPHER Y 02/20/2015, 7:11 AM

## 2015-02-20 NOTE — Progress Notes (Signed)
Pt d/c to [facility] for rehab. Friend is taking patient by car, which was approved by physical therapy. Patient was medicated for pain prior to d/c. Report given to receiving RN at facility, [facility].

## 2015-02-20 NOTE — Discharge Summary (Signed)
Patient ID: [name] MRN: [id number] DOB/AGE: [birth year] 69 y.o.  Admit date: 02/17/2015 Discharge date: 02/20/2015  Admission Diagnoses:  Principal Problem:   Osteoarthritis of right hip Active Problems:   Status post total replacement of right hip   Discharge Diagnoses:  Same  Past Medical History  Diagnosis Date  . Arthritis   . Diverticulosis   . GERD (gastroesophageal reflux disease)   . Osteoporosis   . Tuberous sclerosis     EXTERNAL LESIONS--  MAINLY HANDS (INTERNAL SCLEROTIC BONY LESIONS LUMBAR & PELVIS PER CT 11/2013)  . Seasonal allergies   . H/O hiatal hernia   . History of diverticulitis of colon   . Nodule of right lung     BENIGN--- AND STABLE PER  CT  11/2013  . History of gastric ulcer   . History of epilepsy     childhood age 68 to 58  ---secondary to high calcium deposts of optic nerve---  none since age 28 with pregency  . IBS (irritable bowel syndrome)   . History of kidney stones   . History of shingles     MARCH 2014--  BACK AREA  . History of vulvar dysplasia     S/P  LASER ABLATION 2014  . Right ureteral stone   . Ureteral stenosis, right   . DDD (degenerative disc disease), lumbosacral   . Renal cyst, left     STABLE PER CT 11/2013  . PONV (postoperative nausea and vomiting)     occurred back in 1970's   . Tachycardia   . Mild obstructive sleep apnea     SLEEP STUDY  11-19-2012--  NO CPAP RX (DID NOT MEET CR[facility]IRIA)  . Shortness of breath dyspnea     walking distances and climbing stairs  . Bronchitis     hx of   . Phlebitis     hx of   . Dysrhythmia   . Anginal pain     hx of pt relates to stress last occurrence 8 to 10 years ago; pt states has now been diagnosed with tachycardia last episode of chest pain 1 month ago   . Pneumonia     hx of twice   . Bronchitis     hx of   . Falls     hx of   . Impaired gait and mobility   . Seizures     Surgeries: Procedure(s): RIGHT TOTAL HIP ARTHROPLASTY ANTERIOR APPROACH on  02/17/2015   Consultants:    Discharged Condition: Improved  Hospital Course: [name] is an 69 y.o. female who was admitted 02/17/2015 for operative treatment ofOsteoarthritis of right hip. Patient has severe unremitting pain that affects sleep, daily activities, and work/hobbies. After pre-op clearance the patient was taken to the operating room on 02/17/2015 and underwent  Procedure(s): RIGHT TOTAL HIP ARTHROPLASTY ANTERIOR APPROACH.    Patient was given perioperative antibiotics: Anti-infectives    Start     Dose/Rate Route Frequency Ordered Stop   02/17/15 1700  ceFAZolin (ANCEF) IVPB 1 g/50 mL premix     1 g 100 mL/hr over 30 Minutes Intravenous Every 6 hours 02/17/15 1442 02/17/15 2232   02/17/15 1441  valACYclovir (VALTREX) tablet 1,000 mg     1,000 mg Oral 3 times daily PRN 02/17/15 1442     02/17/15 0725  ceFAZolin (ANCEF) IVPB 2 g/50 mL premix     2 g 100 mL/hr over 30 Minutes Intravenous On call to O.R. 02/17/15 0725 02/17/15 1042  Patient was given sequential compression devices, early ambulation, and chemoprophylaxis to prevent DVT.  Patient benefited maximally from hospital stay and there were no complications.    Recent vital signs: Patient Vitals for the past 24 hrs:  BP Temp Temp src Pulse Resp SpO2  02/20/15 0613 (!) 110/51 mmHg 98.5 F (36.9 C) Oral 78 16 98 %  02/19/15 2018 (!) 118/54 mmHg 98.2 F (36.8 C) Oral 63 16 100 %  02/19/15 1400 (!) 130/53 mmHg 98.7 F (37.1 C) Oral 84 16 99 %     Recent laboratory studies:  Recent Labs  02/18/15 0445 02/19/15 0500 02/20/15 0515  WBC 10.5 8.3 7.0  HGB 9.5* 9.9* 10.4*  HCT 29.6* 30.8* 32.3*  PLT 229 223 210  NA 136  --   --   K 3.9  --   --   CL 109  --   --   CO2 22  --   --   BUN 9  --   --   CREATININE 0.49  --   --   GLUCOSE 132*  --   --   CALCIUM 7.5*  --   --      Discharge Medications:     Medication List    TAKE these medications        acetaminophen 500 MG tablet   Commonly known as:  TYLENOL  Take 500 mg by mouth every 6 (six) hours as needed for mild pain or headache.     aspirin 325 MG EC tablet  Take 1 tablet (325 mg total) by mouth 2 (two) times daily after a meal.     cholecalciferol 1000 UNITS tablet  Commonly known as:  VITAMIN D  Take 4,000 Units by mouth every morning.     cyclobenzaprine 10 MG tablet  Commonly known as:  FLEXERIL  Take 1 tablet (10 mg total) by mouth 3 (three) times daily as needed for muscle spasms.     furosemide 20 MG tablet  Commonly known as:  LASIX  TAKE 1 TABLET BY MOUTH DAILY     HYDROcodone-acetaminophen 5-325 MG per tablet  Commonly known as:  NORCO/VICODIN  Take 1-2 tablets by mouth every 4 (four) hours as needed for moderate pain.     KLOR-CON M20 20 MEQ tablet  Generic drug:  potassium chloride SA  Take 1 tablet by mouth every morning.     methocarbamol 500 MG tablet  Commonly known as:  ROBAXIN  Take 1 tablet (500 mg total) by mouth every 6 (six) hours as needed for muscle spasms.     metoprolol succinate 25 MG 24 hr tablet  Commonly known as:  TOPROL XL  Take 1 tablet (25 mg total) by mouth daily.     omeprazole 40 MG capsule  Commonly known as:  PRILOSEC  Take 40 mg by mouth every morning.     valACYclovir 1000 MG tablet  Commonly known as:  VALTREX  Take 1 tablet (1,000 mg total) by mouth 3 (three) times daily as needed (for outbreaks).        Diagnostic Studies: Dg C-arm 1-60 Min-no Report  02/17/2015   CLINICAL DATA: surgery   C-ARM 1-60 MINUTES  Fluoroscopy was utilized by the requesting physician.  No radiographic  interpretation.    Dg Hip Unilat With Pelvis 1v Right  02/17/2015   CLINICAL DATA:  Right total hip arthroplasty for osteoarthritis.  EXAM: DG HIP (WITH OR WITHOUT PELVIS) 1V RIGHT  COMPARISON:  Portable AP pelvis 10/18/2014.  FINDINGS: Two spot images from the C-arm fluoroscopic device, AP images of the right hip and lower pelvis were submitted for interpretation  postoperatively. These demonstrate anatomic alignment of the right hip arthroplasty in the AP projection. No acute complicating features.  The radiologic technologist documented 30 sec of fluoroscopy time.  IMPRESSION: Anatomic alignment in the AP projection post right total hip arthroplasty.   Electronically Signed   By: Evangeline Dakin M.D.   On: 02/17/2015 12:01   Dg Hip Port Unilat With Pelvis 1v Right  02/17/2015   CLINICAL DATA:  Status post right anterior hip replacement  EXAM: DG HIP (WITH OR WITHOUT PELVIS) 1V PORT RIGHT  COMPARISON:  02/17/2015  FINDINGS: Bilateral hip arthroplasties noted, most recently on the right. The recent right bipolar arthroplasty appears aligned without complicating feature. Expected postop changes of the soft tissues. Bony pelvis intact. No acute osseous finding or hardware abnormality.  IMPRESSION: Expected appearance status post recent right total hip arthroplasty.   Electronically Signed   By: Jerilynn Mages.  Shick M.D.   On: 02/17/2015 12:59    Disposition:   To skilled nursing      Discharge Instructions    Discharge patient    Complete by:  As directed            Follow-up Information    Follow up with Mcarthur Rossetti, MD In 2 weeks.   Specialty:  Orthopedic Surgery   Contact information:   [place] [address] [phone]        Signed: Mcarthur Rossetti 02/20/2015, 7:14 AM   !

## 2015-02-26 DIAGNOSIS — Z471 Aftercare following joint replacement surgery: Secondary | ICD-10-CM | POA: Diagnosis not present

## 2015-02-26 DIAGNOSIS — D649 Anemia, unspecified: Secondary | ICD-10-CM | POA: Diagnosis not present

## 2015-02-26 DIAGNOSIS — M810 Age-related osteoporosis without current pathological fracture: Secondary | ICD-10-CM | POA: Diagnosis not present

## 2015-03-02 DIAGNOSIS — M160 Bilateral primary osteoarthritis of hip: Secondary | ICD-10-CM | POA: Diagnosis not present

## 2015-03-02 DIAGNOSIS — M1611 Unilateral primary osteoarthritis, right hip: Secondary | ICD-10-CM | POA: Diagnosis not present

## 2015-03-13 DIAGNOSIS — Z7982 Long term (current) use of aspirin: Secondary | ICD-10-CM | POA: Diagnosis not present

## 2015-03-13 DIAGNOSIS — D62 Acute posthemorrhagic anemia: Secondary | ICD-10-CM | POA: Diagnosis not present

## 2015-03-13 DIAGNOSIS — Z96642 Presence of left artificial hip joint: Secondary | ICD-10-CM | POA: Diagnosis not present

## 2015-03-13 DIAGNOSIS — Z96641 Presence of right artificial hip joint: Secondary | ICD-10-CM | POA: Diagnosis not present

## 2015-03-13 DIAGNOSIS — I10 Essential (primary) hypertension: Secondary | ICD-10-CM | POA: Diagnosis not present

## 2015-03-13 DIAGNOSIS — Z87891 Personal history of nicotine dependence: Secondary | ICD-10-CM | POA: Diagnosis not present

## 2015-03-13 DIAGNOSIS — Z471 Aftercare following joint replacement surgery: Secondary | ICD-10-CM | POA: Diagnosis not present

## 2015-03-13 DIAGNOSIS — M810 Age-related osteoporosis without current pathological fracture: Secondary | ICD-10-CM | POA: Diagnosis not present

## 2015-03-16 DIAGNOSIS — K219 Gastro-esophageal reflux disease without esophagitis: Secondary | ICD-10-CM | POA: Diagnosis not present

## 2015-03-16 DIAGNOSIS — K449 Diaphragmatic hernia without obstruction or gangrene: Secondary | ICD-10-CM | POA: Diagnosis not present

## 2015-03-16 DIAGNOSIS — D509 Iron deficiency anemia, unspecified: Secondary | ICD-10-CM | POA: Diagnosis not present

## 2015-03-16 DIAGNOSIS — K5730 Diverticulosis of large intestine without perforation or abscess without bleeding: Secondary | ICD-10-CM | POA: Diagnosis not present

## 2015-03-17 DIAGNOSIS — Z471 Aftercare following joint replacement surgery: Secondary | ICD-10-CM | POA: Diagnosis not present

## 2015-03-17 DIAGNOSIS — M810 Age-related osteoporosis without current pathological fracture: Secondary | ICD-10-CM | POA: Diagnosis not present

## 2015-03-17 DIAGNOSIS — I10 Essential (primary) hypertension: Secondary | ICD-10-CM | POA: Diagnosis not present

## 2015-03-17 DIAGNOSIS — D62 Acute posthemorrhagic anemia: Secondary | ICD-10-CM | POA: Diagnosis not present

## 2015-03-17 DIAGNOSIS — Z96641 Presence of right artificial hip joint: Secondary | ICD-10-CM | POA: Diagnosis not present

## 2015-03-17 DIAGNOSIS — Z96642 Presence of left artificial hip joint: Secondary | ICD-10-CM | POA: Diagnosis not present

## 2015-03-20 ENCOUNTER — Ambulatory Visit (HOSPITAL_COMMUNITY): Payer: Self-pay | Admitting: Nurse Practitioner

## 2015-03-20 ENCOUNTER — Other Ambulatory Visit (HOSPITAL_COMMUNITY): Payer: Self-pay | Admitting: *Deleted

## 2015-03-20 DIAGNOSIS — Z96642 Presence of left artificial hip joint: Secondary | ICD-10-CM | POA: Diagnosis not present

## 2015-03-20 DIAGNOSIS — Z471 Aftercare following joint replacement surgery: Secondary | ICD-10-CM | POA: Diagnosis not present

## 2015-03-20 DIAGNOSIS — D62 Acute posthemorrhagic anemia: Secondary | ICD-10-CM | POA: Diagnosis not present

## 2015-03-20 DIAGNOSIS — M810 Age-related osteoporosis without current pathological fracture: Secondary | ICD-10-CM | POA: Diagnosis not present

## 2015-03-20 DIAGNOSIS — I10 Essential (primary) hypertension: Secondary | ICD-10-CM | POA: Diagnosis not present

## 2015-03-20 DIAGNOSIS — Z96641 Presence of right artificial hip joint: Secondary | ICD-10-CM | POA: Diagnosis not present

## 2015-03-20 MED ORDER — METOPROLOL SUCCINATE ER 25 MG PO TB24: 25.0000 mg | ORAL_TABLET | Freq: Every day | ORAL | Status: DC

## 2015-03-22 DIAGNOSIS — Z96642 Presence of left artificial hip joint: Secondary | ICD-10-CM | POA: Diagnosis not present

## 2015-03-22 DIAGNOSIS — M810 Age-related osteoporosis without current pathological fracture: Secondary | ICD-10-CM | POA: Diagnosis not present

## 2015-03-22 DIAGNOSIS — Z96641 Presence of right artificial hip joint: Secondary | ICD-10-CM | POA: Diagnosis not present

## 2015-03-22 DIAGNOSIS — Z471 Aftercare following joint replacement surgery: Secondary | ICD-10-CM | POA: Diagnosis not present

## 2015-03-22 DIAGNOSIS — D62 Acute posthemorrhagic anemia: Secondary | ICD-10-CM | POA: Diagnosis not present

## 2015-03-22 DIAGNOSIS — I10 Essential (primary) hypertension: Secondary | ICD-10-CM | POA: Diagnosis not present

## 2015-03-24 DIAGNOSIS — D62 Acute posthemorrhagic anemia: Secondary | ICD-10-CM | POA: Diagnosis not present

## 2015-03-24 DIAGNOSIS — Z96641 Presence of right artificial hip joint: Secondary | ICD-10-CM | POA: Diagnosis not present

## 2015-03-24 DIAGNOSIS — I10 Essential (primary) hypertension: Secondary | ICD-10-CM | POA: Diagnosis not present

## 2015-03-24 DIAGNOSIS — Z471 Aftercare following joint replacement surgery: Secondary | ICD-10-CM | POA: Diagnosis not present

## 2015-03-24 DIAGNOSIS — Z96642 Presence of left artificial hip joint: Secondary | ICD-10-CM | POA: Diagnosis not present

## 2015-03-24 DIAGNOSIS — M810 Age-related osteoporosis without current pathological fracture: Secondary | ICD-10-CM | POA: Diagnosis not present

## 2015-03-29 DIAGNOSIS — M810 Age-related osteoporosis without current pathological fracture: Secondary | ICD-10-CM | POA: Diagnosis not present

## 2015-03-29 DIAGNOSIS — D62 Acute posthemorrhagic anemia: Secondary | ICD-10-CM | POA: Diagnosis not present

## 2015-03-29 DIAGNOSIS — Z471 Aftercare following joint replacement surgery: Secondary | ICD-10-CM | POA: Diagnosis not present

## 2015-03-29 DIAGNOSIS — Z96642 Presence of left artificial hip joint: Secondary | ICD-10-CM | POA: Diagnosis not present

## 2015-03-29 DIAGNOSIS — Z96641 Presence of right artificial hip joint: Secondary | ICD-10-CM | POA: Diagnosis not present

## 2015-03-29 DIAGNOSIS — I10 Essential (primary) hypertension: Secondary | ICD-10-CM | POA: Diagnosis not present

## 2015-03-31 DIAGNOSIS — M810 Age-related osteoporosis without current pathological fracture: Secondary | ICD-10-CM | POA: Diagnosis not present

## 2015-03-31 DIAGNOSIS — Z96642 Presence of left artificial hip joint: Secondary | ICD-10-CM | POA: Diagnosis not present

## 2015-03-31 DIAGNOSIS — Z471 Aftercare following joint replacement surgery: Secondary | ICD-10-CM | POA: Diagnosis not present

## 2015-03-31 DIAGNOSIS — Z96641 Presence of right artificial hip joint: Secondary | ICD-10-CM | POA: Diagnosis not present

## 2015-03-31 DIAGNOSIS — D62 Acute posthemorrhagic anemia: Secondary | ICD-10-CM | POA: Diagnosis not present

## 2015-03-31 DIAGNOSIS — I10 Essential (primary) hypertension: Secondary | ICD-10-CM | POA: Diagnosis not present

## 2015-04-03 DIAGNOSIS — Z96641 Presence of right artificial hip joint: Secondary | ICD-10-CM | POA: Diagnosis not present

## 2015-04-03 DIAGNOSIS — I10 Essential (primary) hypertension: Secondary | ICD-10-CM | POA: Diagnosis not present

## 2015-04-03 DIAGNOSIS — R2231 Localized swelling, mass and lump, right upper limb: Secondary | ICD-10-CM | POA: Diagnosis not present

## 2015-04-03 DIAGNOSIS — M810 Age-related osteoporosis without current pathological fracture: Secondary | ICD-10-CM | POA: Diagnosis not present

## 2015-04-03 DIAGNOSIS — D62 Acute posthemorrhagic anemia: Secondary | ICD-10-CM | POA: Diagnosis not present

## 2015-04-03 DIAGNOSIS — Z471 Aftercare following joint replacement surgery: Secondary | ICD-10-CM | POA: Diagnosis not present

## 2015-04-03 DIAGNOSIS — Z96642 Presence of left artificial hip joint: Secondary | ICD-10-CM | POA: Diagnosis not present

## 2015-04-07 ENCOUNTER — Other Ambulatory Visit: Payer: Self-pay | Admitting: Physician Assistant

## 2015-04-07 DIAGNOSIS — M25551 Pain in right hip: Secondary | ICD-10-CM | POA: Diagnosis not present

## 2015-04-07 DIAGNOSIS — R2231 Localized swelling, mass and lump, right upper limb: Secondary | ICD-10-CM

## 2015-04-11 ENCOUNTER — Other Ambulatory Visit: Payer: Self-pay | Admitting: Family Medicine

## 2015-04-11 ENCOUNTER — Other Ambulatory Visit: Payer: Self-pay | Admitting: Physician Assistant

## 2015-04-11 DIAGNOSIS — R2231 Localized swelling, mass and lump, right upper limb: Secondary | ICD-10-CM

## 2015-04-18 ENCOUNTER — Other Ambulatory Visit: Payer: Self-pay

## 2015-05-01 ENCOUNTER — Ambulatory Visit
Admission: RE | Admit: 2015-05-01 | Discharge: 2015-05-01 | Disposition: A | Discharge status: Home / Self Care | Payer: Medicare Other | Source: Ambulatory Visit | Attending: Physician Assistant | Admitting: Physician Assistant

## 2015-05-01 ENCOUNTER — Other Ambulatory Visit: Payer: Self-pay | Admitting: Physician Assistant

## 2015-05-01 DIAGNOSIS — R2231 Localized swelling, mass and lump, right upper limb: Secondary | ICD-10-CM

## 2015-05-01 DIAGNOSIS — N6310 Unspecified lump in the right breast, unspecified quadrant: Secondary | ICD-10-CM

## 2015-05-01 DIAGNOSIS — R928 Other abnormal and inconclusive findings on diagnostic imaging of breast: Secondary | ICD-10-CM | POA: Diagnosis not present

## 2015-05-01 DIAGNOSIS — N6489 Other specified disorders of breast: Secondary | ICD-10-CM | POA: Diagnosis not present

## 2015-05-02 ENCOUNTER — Ambulatory Visit: Payer: Self-pay | Admitting: Diagnostic Neuroimaging

## 2015-05-09 ENCOUNTER — Encounter: Payer: Self-pay | Admitting: Physical Therapy

## 2015-05-09 ENCOUNTER — Ambulatory Visit: Payer: Medicare Other | Attending: Orthopaedic Surgery | Admitting: Physical Therapy

## 2015-05-09 DIAGNOSIS — M25561 Pain in right knee: Secondary | ICD-10-CM | POA: Diagnosis not present

## 2015-05-09 DIAGNOSIS — R262 Difficulty in walking, not elsewhere classified: Secondary | ICD-10-CM | POA: Insufficient documentation

## 2015-05-09 DIAGNOSIS — M25562 Pain in left knee: Secondary | ICD-10-CM | POA: Diagnosis not present

## 2015-05-09 DIAGNOSIS — R29898 Other symptoms and signs involving the musculoskeletal system: Secondary | ICD-10-CM

## 2015-05-09 DIAGNOSIS — M6289 Other specified disorders of muscle: Secondary | ICD-10-CM | POA: Diagnosis not present

## 2015-05-10 NOTE — Therapy (Signed)
[facility] [address]  [place], [place], [address] Phone: [phone]   Fax:  [phone]  Physical Therapy Evaluation  Patient Details  Name: [name] MRN: [id number] Date of Birth: Apr 19, 1946 Referring Provider: Jean Rosenthal  Encounter Date: 05/09/2015      PT End of Session - 05/09/15 1459    Visit Number 1   Number of Visits 12   Date for PT Re-Evaluation 06/20/15   PT Start Time [id number]   PT Stop Time 1538   PT Time Calculation (min) 44 min      Past Medical History  Diagnosis Date  . Arthritis   . Diverticulosis   . GERD (gastroesophageal reflux disease)   . Osteoporosis   . Tuberous sclerosis (HCC)     EXTERNAL LESIONS--  MAINLY HANDS (INTERNAL SCLEROTIC BONY LESIONS LUMBAR & PELVIS PER CT 11/2013)  . Seasonal allergies   . H/O hiatal hernia   . History of diverticulitis of colon   . Nodule of right lung     BENIGN--- AND STABLE PER  CT  11/2013  . History of gastric ulcer   . History of epilepsy     childhood age 62 to 67  ---secondary to high calcium deposts of optic nerve---  none since age 54 with pregency  . IBS (irritable bowel syndrome)   . History of kidney stones   . History of shingles     MARCH 2014--  BACK AREA  . History of vulvar dysplasia     S/P  LASER ABLATION 2014  . Right ureteral stone   . Ureteral stenosis, right   . DDD (degenerative disc disease), lumbosacral   . Renal cyst, left     STABLE PER CT 11/2013  . PONV (postoperative nausea and vomiting)     occurred back in 1970's   . Tachycardia   . Mild obstructive sleep apnea     SLEEP STUDY  11-19-2012--  NO CPAP RX (DID NOT MEET CR[facility]IRIA)  . Shortness of breath dyspnea     walking distances and climbing stairs  . Bronchitis     hx of   . Phlebitis     hx of   . Dysrhythmia   . Anginal pain ([place])     hx of pt relates to stress last occurrence 8 to 10 years ago; pt states has now been diagnosed  with tachycardia last episode of chest pain 1 month ago   . Pneumonia     hx of twice   . Bronchitis     hx of   . Falls     hx of   . Impaired gait and mobility   . Seizures [facility])     Past Surgical History  Procedure Laterality Date  . Bladder surgery  1991    bladder reconstruction of torsion  . Colonoscopy    . Upper gastrointestinal endoscopy    . Cataract extraction w/ intraocular lens  implant, bilateral    . Cholecystectomy  11-18-2003  . Cardiac catheterization  06-26-2001    NORMAL CORONARY ARTERIES  . Orif right bimalleolar ankle fx  12-18-2003  . 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 Lumbar disc surgery  1985  . Vaginal hysterectomy  1975  . Hemorrhoid surgery  1970's  . Co2 laser application N/A [id number]    Procedure: CO2 LASER APPLICATION;  Surgeon: Terrance Mass, MD;  Location: [place];  Service: Gynecology;  Laterality: N/A;CO2 laser of vaginal and vulvar lesions  . Gynecologic cryosurgery    . Dilation and curettage of uterus  multiple -- last one 1975  . Augmentation mammaplasty      SALINE  . Cystoscopy with retrograde pyelogram, ureteroscopy and stent placement Right 11/22/2013    Procedure: CYSTOSCOPY WITH RETROGRADE PYELOGRAM, URETEROSCOPY AND STENT PLACEMENT;  Surgeon: Sharyn Creamer, MD;  Location: [facility];  Service: Urology;  Laterality: Right;  . Cystoscopy/retrograde/ureteroscopy Right 12/01/2013    Procedure: CYSTOSCOPY, RIGHT RETROGRADE/RIGHT DIGITAL URETEROSCOPY, RIGHT URETERAL STENT EXCHANGE;  Surgeon: Sharyn Creamer, MD;  Location: [facility];  Service: Urology;  Laterality: Right;  STAGED RIGHT URETEROSCOPY RIGHT URETER DILATION    . Holmium laser application Right [id number]    Procedure: HOLMIUM LASER APPLICATION;  Surgeon: Sharyn Creamer, MD;  Location: [facility];  Service: Urology;  Laterality: Right;  . Total  hip arthroplasty Left 09/16/2014    Procedure: LEFT TOTAL HIP ARTHROPLASTY ANTERIOR APPROACH;  Surgeon: Mcarthur Rossetti, MD;  Location: [facility];  Service: Orthopedics;  Laterality: Left;  . Hip closed reduction Left 10/16/2014    Procedure: CLOSED MANIPULATION HIP;  Surgeon: Mcarthur Rossetti, MD;  Location: [facility];  Service: Orthopedics;  Laterality: Left;  . Anterior hip revision Left 10/18/2014    Procedure: EXCHANGE LEFT HIP BALL OF DIRECT ANTERIOR TOTAL HIP ARTHROPLASTY;  Surgeon: Mcarthur Rossetti, MD;  Location: [place];  Service: Orthopedics;  Laterality: Left;  . Total hip arthroplasty Right 02/17/2015    Procedure: RIGHT TOTAL HIP ARTHROPLASTY ANTERIOR APPROACH;  Surgeon: Mcarthur Rossetti, MD;  Location[facility];  Service: Orthopedics;  Laterality: Right;    There were no vitals filed for this visit.  Visit Diagnosis:  Knee pain, bilateral - Plan: PT plan of care cert/re-cert  Weakness of both hips - Plan: PT plan of care cert/re-cert  Difficulty walking up stairs - Plan: PT plan of care cert/re-cert      Subjective Assessment - 05/09/15 1456    Subjective Pt with c/o B knee pain stating she fell on B knees on concrete floor 14 years ago and has noted knee pain ever since with increasing intensity over the past 1-2 years.  Her chief complaint is difficulty with stairs and car transfers.  She is currently ambulating without use of AD and states rarely has pain with ambulating over level terrain.   Pertinent History L THA April and revision in May 2016 then R THA Sept 2016.   Patient Stated Goals be able to get up and down without limit by knee pain   Currently in Pain? Yes   Pain Score --  7/10 on AVG with activity   Pain Location Knee   Pain Orientation Right;Left;Anterior   Pain Descriptors / Indicators Sharp   Pain Frequency Intermittent   Aggravating Factors  up/down stairs and car transfers, sit<->stand transfers, prolonged standing   Pain Relieving  Factors medication, rest            [facility] PT Assessment - 05/09/15 0001    Assessment   Medical Diagnosis B Knee Pain with Mild OA   Referring Provider Jean Rosenthal   Onset Date/Surgical Date 05/03/13   Balance Screen   Has the patient fallen in the past 6 months Yes   How many times? [facility]   Home Access Stairs  to enter   Entrance Stairs-Number of Steps 3   Home Layout One level   Prior Function   Vocation Part time employment  25 hours/wek   Vocation Requirements sitting and walking   Leisure has been performing HEP from [place]   Observation/Other Assessments   Focus on Therapeutic Outcomes (FOTO)  71% limitation   ROM / Strength   AROM / PROM / Strength Strength;AROM   AROM   Overall AROM Comments B Knees WNL   Strength   Strength Assessment Site Hip   Right/Left Hip Right;Left   Right Hip Extension 4-/5   Right Hip External Rotation  4/5   Right Hip Internal Rotation 4/5   Right Hip ABduction 4+/5   Left Hip Flexion 3-/5   Left Hip Extension 4/5   Left Hip External Rotation 4-/5   Left Hip Internal Rotation 4/5   Left Hip ABduction 4/5   Right/Left Knee Right;Left   Right Knee Flexion 4/5   Right Knee Extension 4-/5   Left Knee Flexion 4+/5   Left Knee Extension 4/5        TODAY'S TREATMENT TherEx - reviewed current THA HEP.  Added Yellow TB to standing hip ABD and Ext and performed in clinic.  Ionto - 80Ma.min patch with dexamethasone applied to anterior L knee in area of pes anserine due to pain and edema in this area > R.                   PT Education - 05/10/15 0717    Education provided Yes   Education Details Added Yellow TB to Standing Hip ABD and Ext to THA HEP   Person(s) Educated Patient   Methods Explanation;Demonstration   Comprehension Verbalized understanding;Returned demonstration             PT Long Term Goals - 05/09/15 1500    PT LONG TERM GOAL #1    Title pt independent with advanced HEP as necessary by 06/20/15   Status New   PT LONG TERM GOAL #2   Title B hip and knee MMT 4+/5 or better grossly by 06/20/15   Status New   PT LONG TERM GOAL #3   Title pt able to safely ambulate up/down stairs with reciprocal gait and single rail use by 06/20/15   Status New   PT LONG TERM GOAL #4   Title pt able to perform all transfers (sit->stand and car transfers) safely, easily, with minimal to no need for UE by 06/20/15   Status New               Plan - 05/09/15 1502    Clinical Impression Statement Pt with c/o B knee pain since falling 14 years ago with significant pain over the past 1-2 years.  She states has a great deal of difficulty with stairs especially with top step unless she has something to help pull her up.  Pt has underwent B THA (anterior approach) within the past year and still with some weakness to B hips.  She is very TTP to B pes anserine with edema noted Bilaterally which is worse on L.  B LE AROM is WNL other than L Hip Flexion limited to 80 degrees due to weakness (3-/5).  Pt with known mild B knee OA which is liklely portion of her symptoms, however, bulk of pain seems PFPS and soft tissue in nature in area of pes anserine.   Pt will benefit from skilled therapeutic intervention in order  to improve on the following deficits Pain;Decreased strength;Decreased mobility;Decreased balance;Difficulty walking   Rehab Potential Good   PT Frequency 2x / week   PT Duration 6 weeks   PT Treatment/Interventions Therapeutic exercise;Manual techniques;Therapeutic activities;Scar mobilization;Taping;Vasopneumatic Device;Patient/family education;Balance training;Gait training;Stair training;Functional mobility training;Electrical Stimulation;Iontophoresis 4mg /ml Dexamethasone;Moist Heat;Cryotherapy   PT Next Visit Plan Patellar mobs; continue ionto to L knee; B hip strengthening and stability training; B knee strengthening (VMO and HS) to  tolerance; gait and balance training (stairs)   Consulted and Agree with Plan of Care Patient          G-Codes - 05-21-2015 0725    Functional Assessment Tool Used foto 71% limitation   Functional Limitation Mobility: Walking and moving around   Mobility: Walking and Moving Around Current Status [phone]) At least 60 percent but less than 80 percent impaired, limited or restricted   Mobility: Walking and Moving Around Goal Status [phone]) At least 40 percent but less than 60 percent impaired, limited or restricted       Problem List Patient Active Problem List   Diagnosis Date Noted  . Osteoarthritis of right hip 02/17/2015  . 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 Atrial tachycardia ([place]) 12/12/2014  . 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 Recurrent dislocation of hip 10/17/2014  . Dislocation of internal left hip prosthesis ([place]) 10/16/2014  . Ectopic atrial rhythm 10/12/2014  . Atrial bigeminy 10/12/2014  . Osteoarthritis of left hip 09/16/2014  . Status post total replacement of left hip 09/16/2014  . ASCUS favoring benign 09/05/2014  . History of vitamin B deficiency 05/02/2014  . Acute bronchitis 04/18/2014  . Oral aphthous ulcer 04/18/2014  . Edema 12/21/2013  . Irregular heart beat 12/21/2013  . Anuria 11/12/2013  . History of cervical dysplasia 09/21/2013  . Rectocele, female 09/21/2013  . Kidney stone 09/21/2013  . Rash and nonspecific skin eruption 06/25/2013  . Obesity (BMI 30-39.9) 06/16/2013  . IBS (irritable bowel syndrome) 12/22/2012  . Other sleep disturbances 10/27/2012  . Elevated BP 09/29/2012  . Weight gain 08/17/2012  . Osteopenia 08/17/2012  . Vulvar lesion 08/10/2012  . Tuberous sclerosis ([place]) 07/14/2012  . Condyloma acuminatum in female 07/14/2012  . Shingles 07/14/2012  . Vitamin D deficiency 07/14/2012    Lennie Dunnigan PT, OCS May 21, 2015, 7:34 AM  [facility] [address]  [place], [place], [address] Phone: [phone]   Fax:  [phone]  Name: [name] MRN: [id number] Date of Birth: [birth year]

## 2015-05-16 ENCOUNTER — Ambulatory Visit: Payer: Medicare Other | Admitting: Physical Therapy

## 2015-05-22 ENCOUNTER — Ambulatory Visit: Payer: Medicare Other | Admitting: Physical Therapy

## 2015-05-23 ENCOUNTER — Ambulatory Visit: Payer: Medicare Other | Admitting: Physical Therapy

## 2015-05-23 DIAGNOSIS — M25561 Pain in right knee: Secondary | ICD-10-CM

## 2015-05-23 DIAGNOSIS — M6289 Other specified disorders of muscle: Secondary | ICD-10-CM | POA: Diagnosis not present

## 2015-05-23 DIAGNOSIS — R262 Difficulty in walking, not elsewhere classified: Secondary | ICD-10-CM

## 2015-05-23 DIAGNOSIS — R29898 Other symptoms and signs involving the musculoskeletal system: Secondary | ICD-10-CM

## 2015-05-23 DIAGNOSIS — M25562 Pain in left knee: Principal | ICD-10-CM

## 2015-05-23 NOTE — Therapy (Signed)
[facility] [address]  [place], [place], [address] Phone: [phone]   Fax:  [phone]  Physical Therapy Treatment  Patient Details  Name: [name] MRN: [id number] Date of Birth: [birth year] Referring Provider: Jean Rosenthal  Encounter Date: 05/23/2015      PT End of Session - 05/23/15 0809    Visit Number 2   Number of Visits 12   Date for PT Re-Evaluation 06/20/15   PT Start Time 0808   PT Stop Time 0850   PT Time Calculation (min) 42 min      Past Medical History  Diagnosis Date  . Arthritis   . Diverticulosis   . GERD (gastroesophageal reflux disease)   . Osteoporosis   . Tuberous sclerosis (HCC)     EXTERNAL LESIONS--  MAINLY HANDS (INTERNAL SCLEROTIC BONY LESIONS LUMBAR & PELVIS PER CT 11/2013)  . Seasonal allergies   . H/O hiatal hernia   . History of diverticulitis of colon   . Nodule of right lung     BENIGN--- AND STABLE PER  CT  11/2013  . History of gastric ulcer   . History of epilepsy     childhood age 78 to 15  ---secondary to high calcium deposts of optic nerve---  none since age 59 with pregency  . IBS (irritable bowel syndrome)   . History of kidney stones   . History of shingles     MARCH 2014--  BACK AREA  . History of vulvar dysplasia     S/P  LASER ABLATION 2014  . Right ureteral stone   . Ureteral stenosis, right   . DDD (degenerative disc disease), lumbosacral   . Renal cyst, left     STABLE PER CT 11/2013  . PONV (postoperative nausea and vomiting)     occurred back in 1970's   . Tachycardia   . Mild obstructive sleep apnea     SLEEP STUDY  11-19-2012--  NO CPAP RX (DID NOT MEET [facility]IRIA)  . Shortness of breath dyspnea     walking distances and climbing stairs  . Bronchitis     hx of   . Phlebitis     hx of   . Dysrhythmia   . Anginal pain ([place])     hx of pt relates to stress last occurrence 8 to 10 years ago; pt states has now been diagnosed  with tachycardia last episode of chest pain 1 month ago   . Pneumonia     hx of twice   . Bronchitis     hx of   . Falls     hx of   . Impaired gait and mobility   . Seizures [facility])     Past Surgical History  Procedure Laterality Date  . Bladder surgery  1991    bladder reconstruction of torsion  . Colonoscopy    . Upper gastrointestinal endoscopy    . Cataract extraction w/ intraocular lens  implant, bilateral    . Cholecystectomy  11-18-2003  . Cardiac catheterization  06-26-2001    NORMAL CORONARY ARTERIES  . Orif right bimalleolar ankle fx  12-18-2003  . 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 Lumbar disc surgery  1985  . Vaginal hysterectomy  1975  . Hemorrhoid surgery  1970's  . Co2 laser application N/A [id number]    Procedure: CO2 LASER APPLICATION;  Surgeon: Terrance Mass, MD;  Location: [place];  Service: Gynecology;  Laterality: N/A;CO2 laser of vaginal and vulvar lesions  . Gynecologic cryosurgery    . Dilation and curettage of uterus  multiple -- last one 1975  . Augmentation mammaplasty      SALINE  . Cystoscopy with retrograde pyelogram, ureteroscopy and stent placement Right 11/22/2013    Procedure: CYSTOSCOPY WITH RETROGRADE PYELOGRAM, URETEROSCOPY AND STENT PLACEMENT;  Surgeon: Sharyn Creamer, MD;  Location: [facility];  Service: Urology;  Laterality: Right;  . Cystoscopy/retrograde/ureteroscopy Right 12/01/2013    Procedure: CYSTOSCOPY, RIGHT RETROGRADE/RIGHT DIGITAL URETEROSCOPY, RIGHT URETERAL STENT EXCHANGE;  Surgeon: Sharyn Creamer, MD;  Location: [facility];  Service: Urology;  Laterality: Right;  STAGED RIGHT URETEROSCOPY RIGHT URETER DILATION    . Holmium laser application Right [id number]    Procedure: HOLMIUM LASER APPLICATION;  Surgeon: Sharyn Creamer, MD;  Location: [facility];  Service: Urology;  Laterality: Right;  . Total  hip arthroplasty Left 09/16/2014    Procedure: LEFT TOTAL HIP ARTHROPLASTY ANTERIOR APPROACH;  Surgeon: Mcarthur Rossetti, MD;  [facility];  Service: Orthopedics;  Laterality: Left;  . Hip closed reduction Left 10/16/2014    Procedure: CLOSED MANIPULATION HIP;  Surgeon: Mcarthur Rossetti, MD;  [facility];  Service: Orthopedics;  Laterality: Left;  . Anterior hip revision Left 10/18/2014    Procedure: EXCHANGE LEFT HIP BALL OF DIRECT ANTERIOR TOTAL HIP ARTHROPLASTY;  Surgeon: Mcarthur Rossetti, MD;  Location: [place];  Service: Orthopedics;  Laterality: Left;  . Total hip arthroplasty Right 02/17/2015    Procedure: RIGHT TOTAL HIP ARTHROPLASTY ANTERIOR APPROACH;  Surgeon: Mcarthur Rossetti, MD;  Location: [facility];  Service: Orthopedics;  Laterality: Right;    There were no vitals filed for this visit.  Visit Diagnosis:  Knee pain, bilateral  Weakness of both hips  Difficulty walking up stairs      Subjective Assessment - 05/23/15 0810    Subjective States has been ill since last treatment (flu).  She states she was sore following last treatment but this has since resolved.  Due to her illness she hasn't been able to tolerate regular performance of HEP but states has performed it some.   Currently in Pain? Yes   Pain Score 3    Pain Location Knee   Pain Orientation Right;Left;Anterior         TODAY'S TREATMENT TherEx - Bridge 12x B FOB Tuck AROM 1' Stretch B HS B SAQ 15x Side-Lying Clam Green TB 12x each  Manual - B ITB strumming, B Patellar inferior and medial glides grade 3  TherEx - B Knee Flexion Machine 25# 2x10 Standing Hip ABD Yellow TB 12x each Standing B Heel Raise 15x  Kinesiotape to B knees: 25% medial and lateral to B patella; 75% anterior knee joint          PT Long Term Goals - 05/23/15 [id number]    PT LONG TERM GOAL #1   Title pt independent with advanced HEP as necessary by 06/20/15   Status On-going   PT LONG TERM GOAL #2    Title B hip and knee MMT 4+/5 or better grossly by 06/20/15   Status On-going   PT LONG TERM GOAL #3   Title pt able to safely ambulate up/down stairs with reciprocal gait and single rail use by 06/20/15   Status On-going   PT LONG TERM GOAL #4   Title pt able to perform all transfers (  sit->stand and car transfers) safely, easily, with minimal to no need for UE by 06/20/15   Status On-going   PT LONG TERM GOAL #5   Title --   Status --               Plan - 05/23/15 [id number]    Clinical Impression Statement pt not seen for 2 weeks; less TTP with patellar mobes.  Hasn't been able to perform HEP regularly due to having flu.  Today's treatment well tolerated but pretty low intensity due to recovering from flu.   PT Next Visit Plan Patellar mobs; assess benefit of tape; B hip strengthening and stability training; B knee strengthening (VMO and HS) to tolerance; gait and balance training (stairs); ionto   Consulted and Agree with Plan of Care Patient        Problem List Patient Active Problem List   Diagnosis Date Noted  . Osteoarthritis of right hip 02/17/2015  . 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 Atrial tachycardia ([place]) 12/12/2014  . 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 Recurrent dislocation of hip 10/17/2014  . Dislocation of internal left hip prosthesis ([place]) 10/16/2014  . Ectopic atrial rhythm 10/12/2014  . Atrial bigeminy 10/12/2014  . Osteoarthritis of left hip 09/16/2014  . Status post total replacement of left hip 09/16/2014  . ASCUS favoring benign 09/05/2014  . History of vitamin B deficiency 05/02/2014  . Acute bronchitis 04/18/2014  . Oral aphthous ulcer 04/18/2014  . Edema 12/21/2013  . Irregular heart beat 12/21/2013  . Anuria 11/12/2013  . History of cervical dysplasia 09/21/2013  . Rectocele, female 09/21/2013  . Kidney stone 09/21/2013  . Rash and nonspecific skin eruption 06/25/2013  . Obesity (BMI 30-39.9)  06/16/2013  . IBS (irritable bowel syndrome) 12/22/2012  . Other sleep disturbances 10/27/2012  . Elevated BP 09/29/2012  . Weight gain 08/17/2012  . Osteopenia 08/17/2012  . Vulvar lesion 08/10/2012  . Tuberous sclerosis ([place]) 07/14/2012  . Condyloma acuminatum in female 07/14/2012  . Shingles 07/14/2012  . Vitamin D deficiency 07/14/2012    Kamuela Magos PT, OCS 05/23/2015, 8:55 AM  [facility] [address]  [place], [place], [address] Phone: [phone]   Fax:  [phone]  Name: [name] MRN: [id number] Date of Birth: 1946-04-25

## 2015-05-30 ENCOUNTER — Ambulatory Visit: Payer: Medicare Other | Admitting: Physical Therapy

## 2015-05-30 DIAGNOSIS — M25561 Pain in right knee: Secondary | ICD-10-CM | POA: Diagnosis not present

## 2015-05-30 DIAGNOSIS — R262 Difficulty in walking, not elsewhere classified: Secondary | ICD-10-CM

## 2015-05-30 DIAGNOSIS — M25562 Pain in left knee: Principal | ICD-10-CM

## 2015-05-30 DIAGNOSIS — R29898 Other symptoms and signs involving the musculoskeletal system: Secondary | ICD-10-CM

## 2015-05-30 DIAGNOSIS — M6289 Other specified disorders of muscle: Secondary | ICD-10-CM | POA: Diagnosis not present

## 2015-05-30 NOTE — Therapy (Signed)
[facility] [address]  [place], [place], [address] Phone: [phone]   Fax:  [phone]  Physical Therapy Treatment  Patient Details  Name: [name] MRN: [id number] Date of Birth: Jan 06, 1946 Referring Provider: Jean Rosenthal  Encounter Date: 05/30/2015      PT End of Session - 05/30/15 0845    Visit Number 3   Number of Visits 12   Date for PT Re-Evaluation 06/20/15   PT Start Time 0845   PT Stop Time 0935   PT Time Calculation (min) 50 min      Past Medical History  Diagnosis Date  . Arthritis   . Diverticulosis   . GERD (gastroesophageal reflux disease)   . Osteoporosis   . Tuberous sclerosis (HCC)     EXTERNAL LESIONS--  MAINLY HANDS (INTERNAL SCLEROTIC BONY LESIONS LUMBAR & PELVIS PER CT 11/2013)  . Seasonal allergies   . H/O hiatal hernia   . History of diverticulitis of colon   . Nodule of right lung     BENIGN--- AND STABLE PER  CT  11/2013  . History of gastric ulcer   . History of epilepsy     childhood age 18 to 18  ---secondary to high calcium deposts of optic nerve---  none since age 78 with pregency  . IBS (irritable bowel syndrome)   . History of kidney stones   . History of shingles     MARCH 2014--  BACK AREA  . History of vulvar dysplasia     S/P  LASER ABLATION 2014  . Right ureteral stone   . Ureteral stenosis, right   . DDD (degenerative disc disease), lumbosacral   . Renal cyst, left     STABLE PER CT 11/2013  . PONV (postoperative nausea and vomiting)     occurred back in 1970's   . Tachycardia   . Mild obstructive sleep apnea     SLEEP STUDY  11-19-2012--  NO CPAP RX (DID NOT MEET CR[facility]IRIA)  . Shortness of breath dyspnea     walking distances and climbing stairs  . Bronchitis     hx of   . Phlebitis     hx of   . Dysrhythmia   . Anginal pain ([place])     hx of pt relates to stress last occurrence 8 to 10 years ago; pt states has now been diagnosed  with tachycardia last episode of chest pain 1 month ago   . Pneumonia     hx of twice   . Bronchitis     hx of   . Falls     hx of   . Impaired gait and mobility   . Seizures [facility])     Past Surgical History  Procedure Laterality Date  . Bladder surgery  1991    bladder reconstruction of torsion  . Colonoscopy    . Upper gastrointestinal endoscopy    . Cataract extraction w/ intraocular lens  implant, bilateral    . Cholecystectomy  11-18-2003  . Cardiac catheterization  06-26-2001    NORMAL CORONARY ARTERIES  . Orif right bimalleolar ankle fx  12-18-2003  . 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 Lumbar disc surgery  1985  . Vaginal hysterectomy  1975  . Hemorrhoid surgery  1970's  . Co2 laser application N/A [id number]    Procedure: CO2 LASER APPLICATION;  Surgeon: Terrance Mass, MD;  Location: [place];  Service: Gynecology;  Laterality: N/A;CO2 laser of vaginal and vulvar lesions  . Gynecologic cryosurgery    . Dilation and curettage of uterus  multiple -- last one 1975  . Augmentation mammaplasty      SALINE  . Cystoscopy with retrograde pyelogram, ureteroscopy and stent placement Right 11/22/2013    Procedure: CYSTOSCOPY WITH RETROGRADE PYELOGRAM, URETEROSCOPY AND STENT PLACEMENT;  Surgeon: Sharyn Creamer, MD;  Location: [facility];  Service: Urology;  Laterality: Right;  . Cystoscopy/retrograde/ureteroscopy Right 12/01/2013    Procedure: CYSTOSCOPY, RIGHT RETROGRADE/RIGHT DIGITAL URETEROSCOPY, RIGHT URETERAL STENT EXCHANGE;  Surgeon: Sharyn Creamer, MD;  Location[facility];  Service: Urology;  Laterality: Right;  STAGED RIGHT URETEROSCOPY RIGHT URETER DILATION    . Holmium laser application Right [id number]    Procedure: HOLMIUM LASER APPLICATION;  Surgeon: Sharyn Creamer, MD;  Location: [facility];  Service: Urology;  Laterality: Right;  . Total  hip arthroplasty Left 09/16/2014    Procedure: LEFT TOTAL HIP ARTHROPLASTY ANTERIOR APPROACH;  Surgeon: Mcarthur Rossetti, MD;  Location: [facility];  Service: Orthopedics;  Laterality: Left;  . Hip closed reduction Left 10/16/2014    Procedure: CLOSED MANIPULATION HIP;  Surgeon: Mcarthur Rossetti, MD;  Location: [facility];  Service: Orthopedics;  Laterality: Left;  . Anterior hip revision Left 10/18/2014    Procedure: EXCHANGE LEFT HIP BALL OF DIRECT ANTERIOR TOTAL HIP ARTHROPLASTY;  Surgeon: Mcarthur Rossetti, MD;  Location: [place];  Service: Orthopedics;  Laterality: Left;  . Total hip arthroplasty Right 02/17/2015    Procedure: RIGHT TOTAL HIP ARTHROPLASTY ANTERIOR APPROACH;  Surgeon: Mcarthur Rossetti, MD;  Location: [facility];  Service: Orthopedics;  Laterality: Right;    There were no vitals filed for this visit.  Visit Diagnosis:  Knee pain, bilateral  Weakness of both hips  Difficulty walking up stairs      Subjective Assessment - 05/30/15 0849    Subjective States has been sore since last treatment in L anterior hip and L knee. States has had to take pain meds at night to sleep due to pain.   Currently in Pain? Yes   Pain Score 3   up to 9/10 lately, 3/10 currently   Pain Location Hip   Pain Orientation Left;Anterior   Multiple Pain Sites Yes   Pain Score --  8-9/10   Pain Location Knee   Pain Orientation Right;Left   Aggravating Factors  palpation, walking, climbing in/out of van        TODAY'S TREATMENT TherEx - NuStep lvl 3, 3' Seated B Hip ABD Blue TB 10x (no c/o pain) Seated Unilateral Hip ADD Blue TB 10x each (notes mild L lateral hip pain) Seated LAQ 3# 10x each (no c/o pain) Attempted Hooklying March - tends to ER L LE and when attempts to correct c/o L Hip pain  Manual - STM / scar massage throughout L anteriolateral hip (especially TFL area)  Supine L Hip ABD/ADD gentle RROM 10x R Side-Lying L Hip Flexion AAROM with PT Assist and L LE on 8"  bolster12x Bridge 10x Seated L Hip IR RROM 10x  Manual - Seated B Patellar inferior and medial glides grade 3  Step-up training with use of SPC in R Hand.  Performed on 8" step 6x with R Leading.  Ionto #2/6 to L anterior knee over Pes Anserine             PT Long Term Goals -  05/23/15 0854    PT LONG TERM GOAL #1   Title pt independent with advanced HEP as necessary by 06/20/15   Status On-going   PT LONG TERM GOAL #2   Title B hip and knee MMT 4+/5 or better grossly by 06/20/15   Status On-going   PT LONG TERM GOAL #3   Title pt able to safely ambulate up/down stairs with reciprocal gait and single rail use by 06/20/15   Status On-going   PT LONG TERM GOAL #4   Title pt able to perform all transfers (sit->stand and car transfers) safely, easily, with minimal to no need for UE by 06/20/15   Status On-going   PT LONG TERM GOAL #5   Title --   Status --               Plan - 05/30/15 0854    Clinical Impression Statement pt attended initial eval 3 weeks ago, had first PT f/u 2 weeks after this, and returns today for 3rd treatment and states she is feeling worse with PT.  She also states that she has been performing Lucianne Lei transfers more lately (up to 20-30x/day) which requires use of step-stool.  This is much more likley the cause of her pain given minimal PT to date and treatments quite low intensity.  She states her pain in L anterior hip and B knees is in the 8-10/10 range (however, she states this with no apparent distress noted).  Significant weakness persists in L hip with Flexion and there is TTP throughout R anteriolateral hip over scar and over what appears to be TFL.  Performed some step-up training today with use of SPC and encouraged pt to use her cane while at work for [employer] transfers.  Pt denies benefit with tape to knee.   PT Next Visit Plan Patellar mobs; B hip strengthening and stability training; B knee strengthening (VMO and HS) to tolerance; gait and balance  training (stairs); ionto   Consulted and Agree with Plan of Care Patient        Problem List Patient Active Problem List   Diagnosis Date Noted  . Osteoarthritis of right hip 02/17/2015  . 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 Atrial tachycardia ([place]) 12/12/2014  . 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 Recurrent dislocation of hip 10/17/2014  . Dislocation of internal left hip prosthesis ([place]) 10/16/2014  . Ectopic atrial rhythm 10/12/2014  . Atrial bigeminy 10/12/2014  . Osteoarthritis of left hip 09/16/2014  . Status post total replacement of left hip 09/16/2014  . ASCUS favoring benign 09/05/2014  . History of vitamin B deficiency 05/02/2014  . Acute bronchitis 04/18/2014  . Oral aphthous ulcer 04/18/2014  . Edema 12/21/2013  . Irregular heart beat 12/21/2013  . Anuria 11/12/2013  . History of cervical dysplasia 09/21/2013  . Rectocele, female 09/21/2013  . Kidney stone 09/21/2013  . Rash and nonspecific skin eruption 06/25/2013  . Obesity (BMI 30-39.9) 06/16/2013  . IBS (irritable bowel syndrome) 12/22/2012  . Other sleep disturbances 10/27/2012  . Elevated BP 09/29/2012  . Weight gain 08/17/2012  . Osteopenia 08/17/2012  . Vulvar lesion 08/10/2012  . Tuberous sclerosis ([place]) 07/14/2012  . Condyloma acuminatum in female 07/14/2012  . Shingles 07/14/2012  . Vitamin D deficiency 07/14/2012    Maansi Wike PT, OCS 05/30/2015, 9:54 AM  [facility] [place], [place], [address] Phone: [phone]  Fax:  [phone]  Name: ERNIE TUCCIO MRN: [id number] Date of Birth: [birth year]

## 2015-06-06 ENCOUNTER — Ambulatory Visit: Payer: Medicare Other | Attending: Orthopaedic Surgery | Admitting: Physical Therapy

## 2015-06-06 DIAGNOSIS — R262 Difficulty in walking, not elsewhere classified: Secondary | ICD-10-CM

## 2015-06-06 DIAGNOSIS — M25562 Pain in left knee: Secondary | ICD-10-CM | POA: Diagnosis not present

## 2015-06-06 DIAGNOSIS — R29898 Other symptoms and signs involving the musculoskeletal system: Secondary | ICD-10-CM

## 2015-06-06 DIAGNOSIS — M6289 Other specified disorders of muscle: Secondary | ICD-10-CM | POA: Insufficient documentation

## 2015-06-06 DIAGNOSIS — M25561 Pain in right knee: Secondary | ICD-10-CM | POA: Diagnosis not present

## 2015-06-06 NOTE — Therapy (Signed)
[facility] [address]  [place], [place], [address] Phone: [phone]   Fax:  [phone]  Physical Therapy Treatment  Patient Details  Name: [name] MRN: [id number] Date of Birth: [birth year] Referring Provider: Jean Rosenthal  Encounter Date: 06/06/2015      PT End of Session - 06/06/15 1105    Visit Number 4   Number of Visits 12   Date for PT Re-Evaluation 06/20/15   PT Start Time 1100   PT Stop Time 1139   PT Time Calculation (min) 39 min      Past Medical History  Diagnosis Date  . Arthritis   . Diverticulosis   . GERD (gastroesophageal reflux disease)   . Osteoporosis   . Tuberous sclerosis (HCC)     EXTERNAL LESIONS--  MAINLY HANDS (INTERNAL SCLEROTIC BONY LESIONS LUMBAR & PELVIS PER CT 11/2013)  . Seasonal allergies   . H/O hiatal hernia   . History of diverticulitis of colon   . Nodule of right lung     BENIGN--- AND STABLE PER  CT  11/2013  . History of gastric ulcer   . History of epilepsy     childhood age 40 to 26  ---secondary to high calcium deposts of optic nerve---  none since age 30 with pregency  . IBS (irritable bowel syndrome)   . History of kidney stones   . History of shingles     MARCH 2014--  BACK AREA  . History of vulvar dysplasia     S/P  LASER ABLATION 2014  . Right ureteral stone   . Ureteral stenosis, right   . DDD (degenerative disc disease), lumbosacral   . Renal cyst, left     STABLE PER CT 11/2013  . PONV (postoperative nausea and vomiting)     occurred back in 1970's   . Tachycardia   . Mild obstructive sleep apnea     SLEEP STUDY  11-19-2012--  NO CPAP RX (DID NOT MEET CRITIRIA)  . Shortness of breath dyspnea     walking distances and climbing stairs  . Bronchitis     hx of   . Phlebitis     hx of   . Dysrhythmia   . Anginal pain ([place])     hx of pt relates to stress last occurrence 8 to 10 years ago; pt states has now been diagnosed  with tachycardia last episode of chest pain 1 month ago   . Pneumonia     hx of twice   . Bronchitis     hx of   . Falls     hx of   . Impaired gait and mobility   . Seizures [facility])     Past Surgical History  Procedure Laterality Date  . Bladder surgery  1991    bladder reconstruction of torsion  . Colonoscopy    . Upper gastrointestinal endoscopy    . Cataract extraction w/ intraocular lens  implant, bilateral    . Cholecystectomy  11-18-2003  . Cardiac catheterization  06-26-2001    NORMAL CORONARY ARTERIES  . Orif right bimalleolar ankle fx  12-18-2003  . 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 Lumbar disc surgery  1985  . Vaginal hysterectomy  1975  . Hemorrhoid surgery  1970's  . Co2 laser application N/A [id number]    Procedure: CO2 LASER APPLICATION;  Surgeon: Terrance Mass, MD;  Location: [place] [facility];  Service: Gynecology;  Laterality: N/A;CO2 laser of vaginal and vulvar lesions  . Gynecologic cryosurgery    . Dilation and curettage of uterus  multiple -- last one 1975  . Augmentation mammaplasty      SALINE  . Cystoscopy with retrograde pyelogram, ureteroscopy and stent placement Right 11/22/2013    Procedure: CYSTOSCOPY WITH RETROGRADE PYELOGRAM, URETEROSCOPY AND STENT PLACEMENT;  Surgeon: Sharyn Creamer, MD;  Location: [facility];  Service: Urology;  Laterality: Right;  . Cystoscopy/retrograde/ureteroscopy Right 12/01/2013    Procedure: CYSTOSCOPY, RIGHT RETROGRADE/RIGHT DIGITAL URETEROSCOPY, RIGHT URETERAL STENT EXCHANGE;  Surgeon: Sharyn Creamer, MD;  Location: [facility];  Service: Urology;  Laterality: Right;  STAGED RIGHT URETEROSCOPY RIGHT URETER DILATION    . Holmium laser application Right [id number]    Procedure: HOLMIUM LASER APPLICATION;  Surgeon: Sharyn Creamer, MD;  Location: [facility];  Service: Urology;  Laterality: Right;  . Total  hip arthroplasty Left 09/16/2014    Procedure: LEFT TOTAL HIP ARTHROPLASTY ANTERIOR APPROACH;  Surgeon: Mcarthur Rossetti, MD;  [facility];  Service: Orthopedics;  Laterality: Left;  . Hip closed reduction Left 10/16/2014    Procedure: CLOSED MANIPULATION HIP;  Surgeon: Mcarthur Rossetti, MD;  Location[facility];  Service: Orthopedics;  Laterality: Left;  . Anterior hip revision Left 10/18/2014    Procedure: EXCHANGE LEFT HIP BALL OF DIRECT ANTERIOR TOTAL HIP ARTHROPLASTY;  Surgeon: Mcarthur Rossetti, MD;  Location: [place];  Service: Orthopedics;  Laterality: Left;  . Total hip arthroplasty Right 02/17/2015    Procedure: RIGHT TOTAL HIP ARTHROPLASTY ANTERIOR APPROACH;  Surgeon: Mcarthur Rossetti, MD;  [facility];  Service: Orthopedics;  Laterality: Right;    There were no vitals filed for this visit.  Visit Diagnosis:  Knee pain, bilateral  Weakness of both hips  Difficulty walking up stairs      Subjective Assessment - 06/06/15 1104    Subjective States had been pain-free this AM until had to go out into weather to come to PT and that has causes some ache/pain in L knee and lower leg.  States this has been her issue since last treatment stating L hip hasn't bothered her as much ("just tender") and no R knee pain lately.   Currently in Pain? Yes   Pain Score 3    Pain Location Knee   Pain Orientation Left;Anterior             TODAY'S TREATMENT TherEx - NuStep lvl 3, 3'  Manual - supine L Patellar inferior and medial glides grade 3, seated L knee AP mobes grade 2  TherEx - SAQ 0# 10x, 4# 15x Bridge 12x  Manual - STM / scar massage throughout L anteriolateral hip  R Side-Lying L Clam AROM 10x, then with Yellow TB 10x R Side-Lying L Hip flexion RROM 12x Hooklying B Hip ABD Blue TB 15x B FOB (peanut) Tuck AROM with Bridge 10x Hooklying Unilateral Hip ADD Blue TB 10x each - no pain but "a little stiffer on L" Seated LAQ 4# 15x each Seated Knee  Flexion Black TB 15x each             PT Long Term Goals - 06/06/15 1130    PT LONG TERM GOAL #1   Title pt independent with advanced HEP as necessary by 06/20/15   Status On-going   PT LONG TERM GOAL #2   Title B hip and knee MMT  4+/5 or better grossly by 06/20/15   Status On-going   PT LONG TERM GOAL #3   Title pt able to safely ambulate up/down stairs with reciprocal gait and single rail use by 06/20/15   Status On-going   PT LONG TERM GOAL #4   Title pt able to perform all transfers (sit->stand and car transfers) safely, easily, with minimal to no need for UE by 06/20/15  still noting difficulty with car transfers but sit<->stand no problem without UE Assist               Plan - 06/06/15 1145    Clinical Impression Statement pt had c/o increased pain last week but this week she states she is feeling better.  She denies noting pain in R knee lately and states L hip is just "tender" lately.  Chief complaint is L knee pain.  She states she was pain-free in L knee earlier this AM but noted onset of ache pain with leaving home and going out in weather for PT appointment.  She denies pain with sit->stand transfers and is able to perform without UE assist; however, she reports still having difficulty with car transfers due to L knee and some L hip pain.  We reviewed use of SPC to assist with stepping up onto stool for Lucianne Lei transfers at work.  She states there are days she has to perform 20-30 transfers and notes L hip and knee pain with this.  She denied pain with performing 8" step-up with [facility] assistance in clinic last week.  Overall, she seems to be making some progress with current POC but has only attended 4 treatments to date (began PT 4 weeks ago on 05/09/15).  POC was for 2x/wk for 6wks but holidays and her work schedule have prevented her attending 2x/wk.  Due to this, I'd like to push out her POC end date from 06/20/15 to 07/04/15 without adding any addition treatments to her POC  (has 8 visits remaining on initial POC).   Pt will benefit from skilled therapeutic intervention in order to improve on the following deficits Pain;Decreased strength;Decreased mobility;Decreased balance;Difficulty walking   Rehab Potential Good   PT Frequency 2x / week   PT Duration 4 weeks   PT Treatment/Interventions Therapeutic exercise;Manual techniques;Therapeutic activities;Scar mobilization;Taping;Vasopneumatic Device;Patient/family education;Balance training;Gait training;Stair training;Functional mobility training;Electrical Stimulation;Iontophoresis 4mg /ml Dexamethasone;Moist Heat;Cryotherapy   PT Next Visit Plan Patellar mobs; B hip strengthening and stability training; B knee strengthening (VMO and HS) to tolerance; gait and balance training (stairs); ionto PRN   Consulted and Agree with Plan of Care Patient        Problem List Patient Active Problem List   Diagnosis Date Noted  . Osteoarthritis of right hip 02/17/2015  . 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 Atrial tachycardia ([place]) 12/12/2014  . 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 Recurrent dislocation of hip 10/17/2014  . Dislocation of internal left hip prosthesis ([place]) 10/16/2014  . Ectopic atrial rhythm 10/12/2014  . Atrial bigeminy 10/12/2014  . Osteoarthritis of left hip 09/16/2014  . Status post total replacement of left hip 09/16/2014  . ASCUS favoring benign 09/05/2014  . History of vitamin B deficiency 05/02/2014  . Acute bronchitis 04/18/2014  . Oral aphthous ulcer 04/18/2014  . Edema 12/21/2013  . Irregular heart beat 12/21/2013  . Anuria 11/12/2013  . History of cervical dysplasia 09/21/2013  . Rectocele, female 09/21/2013  . Kidney stone 09/21/2013  . Rash and nonspecific skin  eruption 06/25/2013  . Obesity (BMI 30-39.9) 06/16/2013  . IBS (irritable bowel syndrome) 12/22/2012  . Other sleep disturbances 10/27/2012  . Elevated BP 09/29/2012  . Weight  gain 08/17/2012  . Osteopenia 08/17/2012  . Vulvar lesion 08/10/2012  . Tuberous sclerosis ([place]) 07/14/2012  . Condyloma acuminatum in female 07/14/2012  . Shingles 07/14/2012  . Vitamin D deficiency 07/14/2012    Rollande Thursby PT, OCS 06/06/2015, 11:56 AM  [facility] [address]  [place], [place], [address] Phone: [phone]   Fax:  [phone]  Name: [name] MRN: [id number] Date of Birth: 08/29/1945

## 2015-06-12 ENCOUNTER — Ambulatory Visit: Payer: Medicare Other | Admitting: Physical Therapy

## 2015-06-13 ENCOUNTER — Ambulatory Visit: Payer: Medicare Other | Admitting: Physical Therapy

## 2015-06-19 ENCOUNTER — Ambulatory Visit: Payer: Medicare Other | Admitting: Physical Therapy

## 2015-06-19 DIAGNOSIS — R29898 Other symptoms and signs involving the musculoskeletal system: Secondary | ICD-10-CM

## 2015-06-19 DIAGNOSIS — M25562 Pain in left knee: Secondary | ICD-10-CM | POA: Diagnosis not present

## 2015-06-19 DIAGNOSIS — R262 Difficulty in walking, not elsewhere classified: Secondary | ICD-10-CM | POA: Diagnosis not present

## 2015-06-19 DIAGNOSIS — M25561 Pain in right knee: Secondary | ICD-10-CM | POA: Diagnosis not present

## 2015-06-19 DIAGNOSIS — M6289 Other specified disorders of muscle: Secondary | ICD-10-CM | POA: Diagnosis not present

## 2015-06-19 NOTE — Therapy (Signed)
[facility] [address]  [place], [place], [address] Phone: [phone]   Fax:  [phone]  Physical Therapy Treatment  Patient Details  Name: Samantha KOZLOSKI MRN: [id number] Date of Birth: 04/13/1946 Referring Provider: Jean Rosenthal  Encounter Date: 06/19/2015      PT End of Session - 06/19/15 1525    Visit Number 5   Number of Visits 12   Date for PT Re-Evaluation 07/04/15   PT Start Time [id number]   PT Stop Time 1610   PT Time Calculation (min) 47 min      Past Medical History  Diagnosis Date  . Arthritis   . Diverticulosis   . GERD (gastroesophageal reflux disease)   . Osteoporosis   . Tuberous sclerosis (HCC)     EXTERNAL LESIONS--  MAINLY HANDS (INTERNAL SCLEROTIC BONY LESIONS LUMBAR & PELVIS PER CT 11/2013)  . Seasonal allergies   . H/O hiatal hernia   . History of diverticulitis of colon   . Nodule of right lung     BENIGN--- AND STABLE PER  CT  11/2013  . History of gastric ulcer   . History of epilepsy     childhood age 51 to 78  ---secondary to high calcium deposts of optic nerve---  none since age 54 with pregency  . IBS (irritable bowel syndrome)   . History of kidney stones   . History of shingles     MARCH 2014--  BACK AREA  . History of vulvar dysplasia     S/P  LASER ABLATION 2014  . Right ureteral stone   . Ureteral stenosis, right   . DDD (degenerative disc disease), lumbosacral   . Renal cyst, left     STABLE PER CT 11/2013  . PONV (postoperative nausea and vomiting)     occurred back in 1970's   . Tachycardia   . Mild obstructive sleep apnea     SLEEP STUDY  11-19-2012--  NO CPAP RX (DID NOT MEET CR[facility]IRIA)  . Shortness of breath dyspnea     walking distances and climbing stairs  . Bronchitis     hx of   . Phlebitis     hx of   . Dysrhythmia   . Anginal pain ([place])     hx of pt relates to stress last occurrence 8 to 10 years ago; pt states has now been diagnosed  with tachycardia last episode of chest pain 1 month ago   . Pneumonia     hx of twice   . Bronchitis     hx of   . Falls     hx of   . Impaired gait and mobility   . Seizures [facility])     Past Surgical History  Procedure Laterality Date  . Bladder surgery  1991    bladder reconstruction of torsion  . Colonoscopy    . Upper gastrointestinal endoscopy    . Cataract extraction w/ intraocular lens  implant, bilateral    . Cholecystectomy  11-18-2003  . Cardiac catheterization  06-26-2001    NORMAL CORONARY ARTERIES  . Orif right bimalleolar ankle fx  12-18-2003  . 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 Lumbar disc surgery  1985  . Vaginal hysterectomy  1975  . Hemorrhoid surgery  1970's  . Co2 laser application N/A [id number]    Procedure: CO2 LASER APPLICATION;  Surgeon: Terrance Mass, MD;  Location: [place];  Service: Gynecology;  Laterality: N/A;CO2 laser of vaginal and vulvar lesions  . Gynecologic cryosurgery    . Dilation and curettage of uterus  multiple -- last one 1975  . Augmentation mammaplasty      SALINE  . Cystoscopy with retrograde pyelogram, ureteroscopy and stent placement Right 11/22/2013    Procedure: CYSTOSCOPY WITH RETROGRADE PYELOGRAM, URETEROSCOPY AND STENT PLACEMENT;  Surgeon: Sharyn Creamer, MD;  Location: [facility];  Service: Urology;  Laterality: Right;  . Cystoscopy/retrograde/ureteroscopy Right 12/01/2013    Procedure: CYSTOSCOPY, RIGHT RETROGRADE/RIGHT DIGITAL URETEROSCOPY, RIGHT URETERAL STENT EXCHANGE;  Surgeon: Sharyn Creamer, MD;  Location: [facility];  Service: Urology;  Laterality: Right;  STAGED RIGHT URETEROSCOPY RIGHT URETER DILATION    . Holmium laser application Right [id number]    Procedure: HOLMIUM LASER APPLICATION;  Surgeon: Sharyn Creamer, MD;  Location: [facility];  Service: Urology;  Laterality: Right;  . Total  hip arthroplasty Left 09/16/2014    Procedure: LEFT TOTAL HIP ARTHROPLASTY ANTERIOR APPROACH;  Surgeon: Mcarthur Rossetti, MD;  Location: [facility];  Service: Orthopedics;  Laterality: Left;  . Hip closed reduction Left 10/16/2014    Procedure: CLOSED MANIPULATION HIP;  Surgeon: Mcarthur Rossetti, MD;  Location: [facility];  Service: Orthopedics;  Laterality: Left;  . Anterior hip revision Left 10/18/2014    Procedure: EXCHANGE LEFT HIP BALL OF DIRECT ANTERIOR TOTAL HIP ARTHROPLASTY;  Surgeon: Mcarthur Rossetti, MD;  Location: [place];  Service: Orthopedics;  Laterality: Left;  . Total hip arthroplasty Right 02/17/2015    Procedure: RIGHT TOTAL HIP ARTHROPLASTY ANTERIOR APPROACH;  Surgeon: Mcarthur Rossetti, MD;  [facility];  Service: Orthopedics;  Laterality: Right;    There were no vitals filed for this visit.  Visit Diagnosis:  Knee pain, bilateral  Weakness of both hips  Difficulty walking up stairs      Subjective Assessment - 06/19/15 1526    Subjective states has been pain-free past few days   Currently in Pain? No/denies          TODAY'S TREATMENT TherEx - NuStep lvl 5,4'  Manual - supine L Patellar inferior and medial glides grade 3, seated L knee AP mobes grade 2, STM / scar massage throughout L anteriolateral hip  TherEx - SAQ 3# 15x Bridge 15x TRX DL Squat 12x TRX Lateral Lunge 8x each Standing Hip ABD, Ext, and ADD Green TB 12x each with no greater than SBA Standing L Hip Flexion 2# 2x10 B Knee Flexion Machine 25# 15x B Knee Extension 10# 15x 6" side step-up 10x each with single pole A (very fatigued on L) Hooklying LE Hip Flexion 10x each (much better control today)            PT Education - 06/19/15 1623    Education provided Yes   Education Details Added hooklying LE march to [employer]) Educated Patient   Methods Explanation;Demonstration   Comprehension Verbalized understanding;Returned demonstration             PT  Long Term Goals - 06/06/15 1130    PT LONG TERM GOAL #1   Title pt independent with advanced HEP as necessary by 06/20/15   Status On-going   PT LONG TERM GOAL #2   Title B hip and knee MMT 4+/5 or better grossly by 06/20/15   Status On-going   PT LONG TERM GOAL #3   Title pt able to safely ambulate  up/down stairs with reciprocal gait and single rail use by 06/20/15   Status On-going   PT LONG TERM GOAL #4   Title pt able to perform all transfers (sit->stand and car transfers) safely, easily, with minimal to no need for UE by 06/20/15  still noting difficulty with car transfers but sit<->stand no problem without UE Assist               Plan - 06/19/15 1618    Clinical Impression Statement Pt still with limited ability for L hip flexion AROM but is improving.  Is now able to perform in supine without hip ER and was able to perform some in standing but is not able to achieve 90 flexion in standing.  Performed very well overall today but will be back here tomorrow so may have limited tolerance with treatment given minimal break between treatments.   PT Next Visit Plan L Knee flexion strengthening, B hip stability; Knee strengthening PRN   Consulted and Agree with Plan of Care Patient        Problem List Patient Active Problem List   Diagnosis Date Noted  . Osteoarthritis of right hip 02/17/2015  . 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 Atrial tachycardia ([employer]) 12/12/2014  . 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 Recurrent dislocation of hip 10/17/2014  . Dislocation of internal left hip prosthesis ([place]) 10/16/2014  . Ectopic atrial rhythm 10/12/2014  . Atrial bigeminy 10/12/2014  . Osteoarthritis of left hip 09/16/2014  . Status post total replacement of left hip 09/16/2014  . ASCUS favoring benign 09/05/2014  . History of vitamin B deficiency 05/02/2014  . Acute bronchitis 04/18/2014  . Oral aphthous ulcer 04/18/2014  . Edema  12/21/2013  . Irregular heart beat 12/21/2013  . Anuria 11/12/2013  . History of cervical dysplasia 09/21/2013  . Rectocele, female 09/21/2013  . Kidney stone 09/21/2013  . Rash and nonspecific skin eruption 06/25/2013  . Obesity (BMI 30-39.9) 06/16/2013  . IBS (irritable bowel syndrome) 12/22/2012  . Other sleep disturbances 10/27/2012  . Elevated BP 09/29/2012  . Weight gain 08/17/2012  . Osteopenia 08/17/2012  . Vulvar lesion 08/10/2012  . Tuberous sclerosis ([place]) 07/14/2012  . Condyloma acuminatum in female 07/14/2012  . Shingles 07/14/2012  . Vitamin D deficiency 07/14/2012    Yovan Leeman PT, OCS 06/19/2015, 4:23 PM  [facility] [address]  [place], [place], [address] Phone: [phone]   Fax:  [phone]  Name: [name] MRN: [id number] Date of Birth: Apr 05, 1946

## 2015-06-20 ENCOUNTER — Ambulatory Visit (HOSPITAL_COMMUNITY)
Admission: RE | Admit: 2015-06-20 | Discharge: 2015-06-20 | Disposition: A | Discharge status: Home / Self Care | Payer: Medicare Other | Source: Ambulatory Visit | Attending: Nurse Practitioner | Admitting: Nurse Practitioner

## 2015-06-20 ENCOUNTER — Encounter (HOSPITAL_COMMUNITY): Payer: Self-pay | Admitting: Nurse Practitioner

## 2015-06-20 ENCOUNTER — Ambulatory Visit: Payer: Medicare Other | Admitting: Physical Therapy

## 2015-06-20 VITALS — BP 130/72 | HR 89 | Ht 63.0 in | Wt 206.0 lb

## 2015-06-20 DIAGNOSIS — K5790 Diverticulosis of intestine, part unspecified, without perforation or abscess without bleeding: Secondary | ICD-10-CM | POA: Diagnosis not present

## 2015-06-20 DIAGNOSIS — R262 Difficulty in walking, not elsewhere classified: Secondary | ICD-10-CM

## 2015-06-20 DIAGNOSIS — R51 Headache: Secondary | ICD-10-CM | POA: Insufficient documentation

## 2015-06-20 DIAGNOSIS — F1010 Alcohol abuse, uncomplicated: Secondary | ICD-10-CM | POA: Diagnosis not present

## 2015-06-20 DIAGNOSIS — Z950 Presence of cardiac pacemaker: Secondary | ICD-10-CM | POA: Diagnosis present

## 2015-06-20 DIAGNOSIS — K589 Irritable bowel syndrome without diarrhea: Secondary | ICD-10-CM | POA: Insufficient documentation

## 2015-06-20 DIAGNOSIS — I471 Supraventricular tachycardia: Secondary | ICD-10-CM | POA: Diagnosis not present

## 2015-06-20 DIAGNOSIS — Z8719 Personal history of other diseases of the digestive system: Secondary | ICD-10-CM | POA: Insufficient documentation

## 2015-06-20 DIAGNOSIS — M25561 Pain in right knee: Secondary | ICD-10-CM

## 2015-06-20 DIAGNOSIS — Z87891 Personal history of nicotine dependence: Secondary | ICD-10-CM | POA: Insufficient documentation

## 2015-06-20 DIAGNOSIS — N281 Cyst of kidney, acquired: Secondary | ICD-10-CM | POA: Diagnosis not present

## 2015-06-20 DIAGNOSIS — Z87442 Personal history of urinary calculi: Secondary | ICD-10-CM | POA: Insufficient documentation

## 2015-06-20 DIAGNOSIS — R569 Unspecified convulsions: Secondary | ICD-10-CM | POA: Diagnosis not present

## 2015-06-20 DIAGNOSIS — M25562 Pain in left knee: Secondary | ICD-10-CM | POA: Diagnosis not present

## 2015-06-20 DIAGNOSIS — G4733 Obstructive sleep apnea (adult) (pediatric): Secondary | ICD-10-CM | POA: Diagnosis not present

## 2015-06-20 DIAGNOSIS — K219 Gastro-esophageal reflux disease without esophagitis: Secondary | ICD-10-CM | POA: Diagnosis not present

## 2015-06-20 DIAGNOSIS — Z7982 Long term (current) use of aspirin: Secondary | ICD-10-CM | POA: Insufficient documentation

## 2015-06-20 DIAGNOSIS — I491 Atrial premature depolarization: Secondary | ICD-10-CM

## 2015-06-20 DIAGNOSIS — I498 Other specified cardiac arrhythmias: Secondary | ICD-10-CM

## 2015-06-20 DIAGNOSIS — R002 Palpitations: Secondary | ICD-10-CM | POA: Insufficient documentation

## 2015-06-20 DIAGNOSIS — R29898 Other symptoms and signs involving the musculoskeletal system: Secondary | ICD-10-CM

## 2015-06-20 DIAGNOSIS — M6289 Other specified disorders of muscle: Secondary | ICD-10-CM | POA: Diagnosis not present

## 2015-06-20 DIAGNOSIS — M810 Age-related osteoporosis without current pathological fracture: Secondary | ICD-10-CM | POA: Diagnosis not present

## 2015-06-20 MED ORDER — METOPROLOL SUCCINATE ER 25 MG PO TB24: 25.0000 mg | ORAL_TABLET | Freq: Every day | ORAL | Status: DC

## 2015-06-20 NOTE — Progress Notes (Signed)
Patient ID: [name], female   DOB: [birth year], 70 y.o.   MRN: [id number]     Primary Care Physician: Reginia Naas, MD Referring Physician: Dr. Elinor Dodge is a 70 y.o. female with a h/o atrial tachycardia/ atrial ectopic pacemaker that is in the afib clinic for f/u. On last visit with Dr. Ripley Fraise was started on diltiazem with improvement of symptoms/palpitations. She is on asa, no DOAC due to no finding of afib on recent monitor when reviewed by Dr. Rayann Heman. She  had a slight headache since starting cardizem, changed to metoprolol and headache resolved. She had hip surgery last fall and has done well. She does not feel her heart racing any longer since rate control med was started.   Today, she denies symptoms of palpitations, chest pain, shortness of breath, orthopnea, PND, lower extremity edema, dizziness, presyncope, syncope, or neurologic sequela. The patient is tolerating medications without difficulties and is otherwise without complaint today.   Past Medical History  Diagnosis Date  . Arthritis   . Diverticulosis   . GERD (gastroesophageal reflux disease)   . Osteoporosis   . Tuberous sclerosis (HCC)     EXTERNAL LESIONS--  MAINLY HANDS (INTERNAL SCLEROTIC BONY LESIONS LUMBAR & PELVIS PER CT 11/2013)  . Seasonal allergies   . H/O hiatal hernia   . History of diverticulitis of colon   . Nodule of right lung     BENIGN--- AND STABLE PER  CT  11/2013  . History of gastric ulcer   . History of epilepsy     childhood age 71 to 83  ---secondary to high calcium deposts of optic nerve---  none since age 38 with pregency  . IBS (irritable bowel syndrome)   . History of kidney stones   . History of shingles     MARCH 2014--  BACK AREA  . History of vulvar dysplasia     S/P  LASER ABLATION 2014  . Right ureteral stone   . Ureteral stenosis, right   . DDD (degenerative disc disease), lumbosacral   . Renal cyst, left     STABLE PER CT 11/2013  . PONV  (postoperative nausea and vomiting)     occurred back in 1970's   . Tachycardia   . Mild obstructive sleep apnea     SLEEP STUDY  11-19-2012--  NO CPAP RX (DID NOT MEET CRITIRIA)  . Shortness of breath dyspnea     walking distances and climbing stairs  . Bronchitis     hx of   . Phlebitis     hx of   . Dysrhythmia   . Anginal pain ([place])     hx of pt relates to stress last occurrence 8 to 10 years ago; pt states has now been diagnosed with tachycardia last episode of chest pain 1 month ago   . Pneumonia     hx of twice   . Bronchitis     hx of   . Falls     hx of   . Impaired gait and mobility   . Seizures [facility])    Past Surgical History  Procedure Laterality Date  . Bladder surgery  1991    bladder reconstruction of torsion  . Colonoscopy    . Upper gastrointestinal endoscopy    . Cataract extraction w/ intraocular lens  implant, bilateral    . Cholecystectomy  11-18-2003  . Cardiac catheterization  06-26-2001    NORMAL CORONARY ARTERIES  . Orif right bimalleolar  ankle fx  12-18-2003  . 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 Lumbar disc surgery  1985  . Vaginal hysterectomy  1975  . Hemorrhoid surgery  1970's  . Co2 laser application N/A [id number]    Procedure: CO2 LASER APPLICATION;  Surgeon: Terrance Mass, MD;  Location: [facility];  Service: Gynecology;  Laterality: N/A;CO2 laser of vaginal and vulvar lesions  . Gynecologic cryosurgery    . Dilation and curettage of uterus  multiple -- last one 1975  . Augmentation mammaplasty      SALINE  . Cystoscopy with retrograde pyelogram, ureteroscopy and stent placement Right 11/22/2013    Procedure: CYSTOSCOPY WITH RETROGRADE PYELOGRAM, URETEROSCOPY AND STENT PLACEMENT;  Surgeon: Sharyn Creamer, MD;  Location: [facility];  Service: Urology;  Laterality: Right;  . Cystoscopy/retrograde/ureteroscopy Right 12/01/2013    Procedure: CYSTOSCOPY,  RIGHT RETROGRADE/RIGHT DIGITAL URETEROSCOPY, RIGHT URETERAL STENT EXCHANGE;  Surgeon: Sharyn Creamer, MD;  Location: [facility];  Service: Urology;  Laterality: Right;  STAGED RIGHT URETEROSCOPY RIGHT URETER DILATION    . Holmium laser application Right [id number]    Procedure: HOLMIUM LASER APPLICATION;  Surgeon: Sharyn Creamer, MD;  Location: [facility];  Service: Urology;  Laterality: Right;  . Total hip arthroplasty Left 09/16/2014    Procedure: LEFT TOTAL HIP ARTHROPLASTY ANTERIOR APPROACH;  Surgeon: Mcarthur Rossetti, MD;  [facility];  Service: Orthopedics;  Laterality: Left;  . Hip closed reduction Left 10/16/2014    Procedure: CLOSED MANIPULATION HIP;  Surgeon: Mcarthur Rossetti, MD;  Location[facility];  Service: Orthopedics;  Laterality: Left;  . Anterior hip revision Left 10/18/2014    Procedure: EXCHANGE LEFT HIP BALL OF DIRECT ANTERIOR TOTAL HIP ARTHROPLASTY;  Surgeon: Mcarthur Rossetti, MD;  Location: [place];  Service: Orthopedics;  Laterality: Left;  . Total hip arthroplasty Right 02/17/2015    Procedure: RIGHT TOTAL HIP ARTHROPLASTY ANTERIOR APPROACH;  Surgeon: Mcarthur Rossetti, MD;  [facility];  Service: Orthopedics;  Laterality: Right;    Current Outpatient Prescriptions  Medication Sig Dispense Refill  . acetaminophen (TYLENOL) 500 MG tablet Take 500 mg by mouth every 6 (six) hours as needed for mild pain or headache.    Marland Kitchen aspirin EC 325 MG EC tablet Take 1 tablet (325 mg total) by mouth 2 (two) times daily after a meal. (Patient taking differently: Take 325 mg by mouth daily. ) 30 tablet 0  . cholecalciferol (VITAMIN D) 1000 UNITS tablet Take 4,000 Units by mouth every morning.     . cyclobenzaprine (FLEXERIL) 10 MG tablet Take 1 tablet (10 mg total) by mouth 3 (three) times daily as needed for muscle spasms. 60 tablet 0  . furosemide (LASIX) 20 MG tablet TAKE 1 TABLET BY MOUTH DAILY (Patient taking differently:  TAKE 1 TABLET BY MOUTH DAILY IN THE MORNING) 90 tablet 0  . HYDROcodone-acetaminophen (NORCO/VICODIN) 5-325 MG per tablet Take 1-2 tablets by mouth every 4 (four) hours as needed for moderate pain. 60 tablet 0  . KLOR-CON M20 20 MEQ tablet Take 1 tablet by mouth every morning.  0  . methocarbamol (ROBAXIN) 500 MG tablet Take 1 tablet (500 mg total) by mouth every 6 (six) hours as needed for muscle spasms. 60 tablet 0  . metoprolol succinate (TOPROL XL) 25 MG 24 hr tablet Take 1 tablet (25 mg total) by mouth at bedtime. 30 tablet 6  .  omeprazole (PRILOSEC) 40 MG capsule Take 40 mg by mouth every morning.     . valACYclovir (VALTREX) 1000 MG tablet Take 1 tablet (1,000 mg total) by mouth 3 (three) times daily as needed (for outbreaks). 20 tablet 0   No current facility-administered medications for this encounter.    Allergies  Allergen Reactions  . Other     All anti-depressants pt states makes her feel as if her body is shutting down.  Marland Kitchen Zoloft [Sertraline Hcl]     Pt can not take SSRI - nausea and rash   . Percocet [Oxycodone-Acetaminophen] Nausea And Vomiting  . Contrast Media [Iodinated Diagnostic Agents] Rash    States was oral contrast- not betadine based IV contrast    Social History   Social History  . Marital Status: Widowed    Spouse Name: N/A  . Number of Children: 2  . Years of Education: 12   Occupational History  . driver Other    [employer]   Social History Main Topics  . Smoking status: Former Smoker -- 2.00 packs/day for 25 years    Types: Cigarettes    Quit date: 06/03/1998  . Smokeless tobacco: Never Used  . Alcohol Use: Yes     Comment: rarely has a glass of wine  . Drug Use: No  . Sexual Activity:    Partners: Male   Other Topics Concern  . Not on file   Social History Narrative   Exercise-no   Lives alone    Drinks decaf coffee 1 cup a day     Family History  Problem Relation Age of Onset  . Heart disease Mother   . Tuberous  sclerosis Mother   . Tuberous sclerosis Sister   . Tuberous sclerosis Daughter   . Tuberous sclerosis Son     2 sons  . Tuberous sclerosis Maternal Aunt   . Tuberous sclerosis Maternal Grandmother   . Tuberous sclerosis Maternal Aunt   . Tuberous sclerosis Maternal Aunt   . Tuberous sclerosis Cousin   . Uterine cancer Mother     ROS- All systems are reviewed and negative except as per the HPI above  Physical Exam: Filed Vitals:   06/20/15 1018  BP: 130/72  Pulse: 89  Height: 5\' 3"  (1.6 m)  Weight: 206 lb (93.441 kg)    GEN- The patient is well appearing, alert and oriented x 3 today.   Head- normocephalic, atraumatic Eyes-  Sclera clear, conjunctiva pink Ears- hearing intact Oropharynx- clear Neck- supple, no JVP Lymph- no cervical lymphadenopathy Lungs- Clear to ausculation bilaterally, normal work of breathing Heart- Regular rate and rhythm, no murmurs, rubs or gallops, PMI not laterally displaced GI- soft, NT, ND, + BS Extremities- no clubbing, cyanosis, or edema MS- no significant deformity or atrophy Skin- no rash or lesion Psych- euthymic mood, full affect Neuro- strength and sensation are intact  EKG- Unusual P axsis, possible ectopic atrial rhythm with PAC's, in a pattern of bigeminy. Pr int 134 ms, qrs int 76 ms, qtc 450 ms  Assessment and Plan: 1. Atrial tachycardia Diltiazem has been changed to metoprolol due to headache and  has been helpful to resolve palpitations/ racing heart rate Continue asa, pr Dr. Rayann Heman, no evidence of afib by monitor that pt previously wore.  F/u with pcp Dr. Rayann Heman if rhythm issues change  Butch Penny C. Braulio Kiedrowski, [facility] [address] [place],  [address] [phone]

## 2015-06-20 NOTE — Therapy (Signed)
[facility] [address]  [place], [place], [address] Phone: [phone]   Fax:  [phone]  Physical Therapy Treatment  Patient Details  Name: [name] MRN: [id number] Date of Birth: 1946-04-27 Referring Provider: Jean Rosenthal  Encounter Date: 06/20/2015      PT End of Session - 06/20/15 1533    Visit Number 6   Number of Visits 12   Date for PT Re-Evaluation 07/04/15   PT Start Time [id number]   PT Stop Time 1616   PT Time Calculation (min) 45 min      Past Medical History  Diagnosis Date  . Arthritis   . Diverticulosis   . GERD (gastroesophageal reflux disease)   . Osteoporosis   . Tuberous sclerosis (HCC)     EXTERNAL LESIONS--  MAINLY HANDS (INTERNAL SCLEROTIC BONY LESIONS LUMBAR & PELVIS PER CT 11/2013)  . Seasonal allergies   . H/O hiatal hernia   . History of diverticulitis of colon   . Nodule of right lung     BENIGN--- AND STABLE PER  CT  11/2013  . History of gastric ulcer   . History of epilepsy     childhood age 77 to 65  ---secondary to high calcium deposts of optic nerve---  none since age 38 with pregency  . IBS (irritable bowel syndrome)   . History of kidney stones   . History of shingles     MARCH 2014--  BACK AREA  . History of vulvar dysplasia     S/P  LASER ABLATION 2014  . Right ureteral stone   . Ureteral stenosis, right   . DDD (degenerative disc disease), lumbosacral   . Renal cyst, left     STABLE PER CT 11/2013  . PONV (postoperative nausea and vomiting)     occurred back in 1970's   . Tachycardia   . Mild obstructive sleep apnea     SLEEP STUDY  11-19-2012--  NO CPAP RX (DID NOT MEET [facility])  . Shortness of breath dyspnea     walking distances and climbing stairs  . Bronchitis     hx of   . Phlebitis     hx of   . Dysrhythmia   . Anginal pain ([place])     hx of pt relates to stress last occurrence 8 to 10 years ago; pt states has now been diagnosed  with tachycardia last episode of chest pain 1 month ago   . Pneumonia     hx of twice   . Bronchitis     hx of   . Falls     hx of   . Impaired gait and mobility   . Seizures [facility])     Past Surgical History  Procedure Laterality Date  . Bladder surgery  1991    bladder reconstruction of torsion  . Colonoscopy    . Upper gastrointestinal endoscopy    . Cataract extraction w/ intraocular lens  implant, bilateral    . Cholecystectomy  11-18-2003  . Cardiac catheterization  06-26-2001    NORMAL CORONARY ARTERIES  . Orif right bimalleolar ankle fx  12-18-2003  . 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 Lumbar disc surgery  1985  . Vaginal hysterectomy  1975  . Hemorrhoid surgery  1970's  . Co2 laser application N/A [id number]    Procedure: CO2 LASER APPLICATION;  Surgeon: Terrance Mass, MD;  Location: [facility];  Service: Gynecology;  Laterality: N/A;CO2 laser of vaginal and vulvar lesions  . Gynecologic cryosurgery    . Dilation and curettage of uterus  multiple -- last one 1975  . Augmentation mammaplasty      SALINE  . Cystoscopy with retrograde pyelogram, ureteroscopy and stent placement Right 11/22/2013    Procedure: CYSTOSCOPY WITH RETROGRADE PYELOGRAM, URETEROSCOPY AND STENT PLACEMENT;  Surgeon: Sharyn Creamer, MD;  Location: [facility];  Service: Urology;  Laterality: Right;  . Cystoscopy/retrograde/ureteroscopy Right 12/01/2013    Procedure: CYSTOSCOPY, RIGHT RETROGRADE/RIGHT DIGITAL URETEROSCOPY, RIGHT URETERAL STENT EXCHANGE;  Surgeon: Sharyn Creamer, MD;  Location: [facility];  Service: Urology;  Laterality: Right;  STAGED RIGHT URETEROSCOPY RIGHT URETER DILATION    . Holmium laser application Right [id number]    Procedure: HOLMIUM LASER APPLICATION;  Surgeon: Sharyn Creamer, MD;  Location: [facility];  Service: Urology;  Laterality: Right;  . Total  hip arthroplasty Left 09/16/2014    Procedure: LEFT TOTAL HIP ARTHROPLASTY ANTERIOR APPROACH;  Surgeon: Mcarthur Rossetti, MD;  Location: [facility];  Service: Orthopedics;  Laterality: Left;  . Hip closed reduction Left 10/16/2014    Procedure: CLOSED MANIPULATION HIP;  Surgeon: Mcarthur Rossetti, MD;  Location: [facility];  Service: Orthopedics;  Laterality: Left;  . Anterior hip revision Left 10/18/2014    Procedure: EXCHANGE LEFT HIP BALL OF DIRECT ANTERIOR TOTAL HIP ARTHROPLASTY;  Surgeon: Mcarthur Rossetti, MD;  Location: [facility];  Service: Orthopedics;  Laterality: Left;  . Total hip arthroplasty Right 02/17/2015    Procedure: RIGHT TOTAL HIP ARTHROPLASTY ANTERIOR APPROACH;  Surgeon: Mcarthur Rossetti, MD;  Location: [facility];  Service: Orthopedics;  Laterality: Right;    There were no vitals filed for this visit.  Visit Diagnosis:  Knee pain, bilateral  Weakness of both hips  Difficulty walking up stairs      Subjective Assessment - 06/20/15 1534    Subjective states B thighs sore today after yesterday's treatment but no c/o increased knee pain - no joint pain today.   Currently in Pain? No/denies           TODAY'S TREATMENT TherEx - NuStep lvl 3, 5' Stretch B HS, Piri, SKTC, Prone knee flexion (tight B) Bridge 15x Hooklying B Hip ABD Black TB 15x Hooklying Unilateral Hip Add Black TB 15x each Seated Fitter 1 Black + 1 Blue 15x each  Manual - supine L Patellar inferior and medial glides grade 3, STM / scar massage throughout L anteriolateral hip and into thigh due to soreness in quads            PT Education - 06/19/15 1623    Education provided Yes   Education Details Added hooklying LE march to [employer]) Educated Patient   Methods Explanation;Demonstration   Comprehension Verbalized understanding;Returned demonstration             PT Long Term Goals - 06/06/15 1130    PT LONG TERM GOAL #1   Title pt independent with advanced HEP as  necessary by 06/20/15   Status On-going   PT LONG TERM GOAL #2   Title B hip and knee MMT 4+/5 or better grossly by 06/20/15   Status On-going   PT LONG TERM GOAL #3   Title pt able to safely ambulate up/down stairs with reciprocal gait and single rail use by 06/20/15   Status On-going   PT LONG TERM GOAL #  4   Title pt able to perform all transfers (sit->stand and car transfers) safely, easily, with minimal to no need for UE by 06/20/15  still noting difficulty with car transfers but sit<->stand no problem without UE Assist               Plan - 06/20/15 1555    Clinical Impression Statement B thigh pain/muscle soreness following yesterday's PT session but states knees feel fine without pain today.  Light intensity treatment today due to soreness.   PT Next Visit Plan L Knee flexion strengthening, B hip stability; Knee strengthening PRN; Prone Knee Flexion stretching   Consulted and Agree with Plan of Care Patient        Problem List Patient Active Problem List   Diagnosis Date Noted  . Osteoarthritis of right hip 02/17/2015  . 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 Atrial tachycardia ([place]) 12/12/2014  . 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 Recurrent dislocation of hip 10/17/2014  . Dislocation of internal left hip prosthesis ([place]) 10/16/2014  . Ectopic atrial rhythm 10/12/2014  . Atrial bigeminy 10/12/2014  . Osteoarthritis of left hip 09/16/2014  . Status post total replacement of left hip 09/16/2014  . ASCUS favoring benign 09/05/2014  . History of vitamin B deficiency 05/02/2014  . Acute bronchitis 04/18/2014  . Oral aphthous ulcer 04/18/2014  . Edema 12/21/2013  . Irregular heart beat 12/21/2013  . Anuria 11/12/2013  . History of cervical dysplasia 09/21/2013  . Rectocele, female 09/21/2013  . Kidney stone 09/21/2013  . Rash and nonspecific skin eruption 06/25/2013  . Obesity (BMI 30-39.9) 06/16/2013  . IBS  (irritable bowel syndrome) 12/22/2012  . Other sleep disturbances 10/27/2012  . Elevated BP 09/29/2012  . Weight gain 08/17/2012  . Osteopenia 08/17/2012  . Vulvar lesion 08/10/2012  . Tuberous sclerosis ([place]) 07/14/2012  . Condyloma acuminatum in female 07/14/2012  . Shingles 07/14/2012  . Vitamin D deficiency 07/14/2012    Reise Gladney PT, OCS 06/20/2015, 4:19 PM  [facility] [address]  [place], [place], [address] Phone: [phone]   Fax:  [phone]  Name: [name] MRN: [id number] Date of Birth: 1945/06/28

## 2015-06-26 ENCOUNTER — Ambulatory Visit: Payer: Medicare Other | Admitting: Physical Therapy

## 2015-06-26 DIAGNOSIS — M6289 Other specified disorders of muscle: Secondary | ICD-10-CM | POA: Diagnosis not present

## 2015-06-26 DIAGNOSIS — M25561 Pain in right knee: Secondary | ICD-10-CM

## 2015-06-26 DIAGNOSIS — R262 Difficulty in walking, not elsewhere classified: Secondary | ICD-10-CM

## 2015-06-26 DIAGNOSIS — M25562 Pain in left knee: Principal | ICD-10-CM

## 2015-06-26 DIAGNOSIS — R29898 Other symptoms and signs involving the musculoskeletal system: Secondary | ICD-10-CM

## 2015-06-26 DIAGNOSIS — Z96641 Presence of right artificial hip joint: Secondary | ICD-10-CM | POA: Diagnosis not present

## 2015-06-26 NOTE — Therapy (Signed)
[facility] [address]  [place], [place], [address] Phone: [phone]   Fax:  [phone]  Physical Therapy Treatment  Patient Details  Name: [name] MRN: [id number] Date of Birth: 1945/12/13 Referring Provider: Jean Rosenthal  Encounter Date: 06/26/2015      PT End of Session - 06/26/15 1537    Visit Number 7   Number of Visits 12   Date for PT Re-Evaluation 07/04/15   PT Start Time [id number]   PT Stop Time 1618   PT Time Calculation (min) 43 min      Past Medical History  Diagnosis Date  . Arthritis   . Diverticulosis   . GERD (gastroesophageal reflux disease)   . Osteoporosis   . Tuberous sclerosis (HCC)     EXTERNAL LESIONS--  MAINLY HANDS (INTERNAL SCLEROTIC BONY LESIONS LUMBAR & PELVIS PER CT 11/2013)  . Seasonal allergies   . H/O hiatal hernia   . History of diverticulitis of colon   . Nodule of right lung     BENIGN--- AND STABLE PER  CT  11/2013  . History of gastric ulcer   . History of epilepsy     childhood age 49 to 89  ---secondary to high calcium deposts of optic nerve---  none since age 73 with pregency  . IBS (irritable bowel syndrome)   . History of kidney stones   . History of shingles     MARCH 2014--  BACK AREA  . History of vulvar dysplasia     S/P  LASER ABLATION 2014  . Right ureteral stone   . Ureteral stenosis, right   . DDD (degenerative disc disease), lumbosacral   . Renal cyst, left     STABLE PER CT 11/2013  . PONV (postoperative nausea and vomiting)     occurred back in 1970's   . Tachycardia   . Mild obstructive sleep apnea     SLEEP STUDY  11-19-2012--  NO CPAP RX (DID NOT MEET [facility])  . Shortness of breath dyspnea     walking distances and climbing stairs  . Bronchitis     hx of   . Phlebitis     hx of   . Dysrhythmia   . Anginal pain ([place])     hx of pt relates to stress last occurrence 8 to 10 years ago; pt states has now been diagnosed  with tachycardia last episode of chest pain 1 month ago   . Pneumonia     hx of twice   . Bronchitis     hx of   . Falls     hx of   . Impaired gait and mobility   . Seizures [facility])     Past Surgical History  Procedure Laterality Date  . Bladder surgery  1991    bladder reconstruction of torsion  . Colonoscopy    . Upper gastrointestinal endoscopy    . Cataract extraction w/ intraocular lens  implant, bilateral    . Cholecystectomy  11-18-2003  . Cardiac catheterization  06-26-2001    NORMAL CORONARY ARTERIES  . Orif right bimalleolar ankle fx  12-18-2003  . 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 Lumbar disc surgery  1985  . Vaginal hysterectomy  1975  . Hemorrhoid surgery  1970's  . Co2 laser application N/A [id number]    Procedure: CO2 LASER APPLICATION;  Surgeon: Terrance Mass, MD;  Location: [facility];  Service: Gynecology;  Laterality: N/A;CO2 laser of vaginal and vulvar lesions  . Gynecologic cryosurgery    . Dilation and curettage of uterus  multiple -- last one 1975  . Augmentation mammaplasty      SALINE  . Cystoscopy with retrograde pyelogram, ureteroscopy and stent placement Right 11/22/2013    Procedure: CYSTOSCOPY WITH RETROGRADE PYELOGRAM, URETEROSCOPY AND STENT PLACEMENT;  Surgeon: Sharyn Creamer, MD;  Location: [facility];  Service: Urology;  Laterality: Right;  . Cystoscopy/retrograde/ureteroscopy Right 12/01/2013    Procedure: CYSTOSCOPY, RIGHT RETROGRADE/RIGHT DIGITAL URETEROSCOPY, RIGHT URETERAL STENT EXCHANGE;  Surgeon: Sharyn Creamer, MD;  Location: [facility];  Service: Urology;  Laterality: Right;  STAGED RIGHT URETEROSCOPY RIGHT URETER DILATION    . Holmium laser application Right [id number]    Procedure: HOLMIUM LASER APPLICATION;  Surgeon: Sharyn Creamer, MD;  Location: [facility];  Service: Urology;  Laterality: Right;  . Total  hip arthroplasty Left 09/16/2014    Procedure: LEFT TOTAL HIP ARTHROPLASTY ANTERIOR APPROACH;  Surgeon: Mcarthur Rossetti, MD;  Location: [facility];  Service: Orthopedics;  Laterality: Left;  . Hip closed reduction Left 10/16/2014    Procedure: CLOSED MANIPULATION HIP;  Surgeon: Mcarthur Rossetti, MD;  Location: [facility];  Service: Orthopedics;  Laterality: Left;  . Anterior hip revision Left 10/18/2014    Procedure: EXCHANGE LEFT HIP BALL OF DIRECT ANTERIOR TOTAL HIP ARTHROPLASTY;  Surgeon: Mcarthur Rossetti, MD;  Location: [facility];  Service: Orthopedics;  Laterality: Left;  . Total hip arthroplasty Right 02/17/2015    Procedure: RIGHT TOTAL HIP ARTHROPLASTY ANTERIOR APPROACH;  Surgeon: Mcarthur Rossetti, MD;  Location: [facility];  Service: Orthopedics;  Laterality: Right;    There were no vitals filed for this visit.  Visit Diagnosis:  Knee pain, bilateral  Weakness of both hips  Difficulty walking up stairs      Subjective Assessment - 06/26/15 1538    Subjective States pain still minimal to non lately.  Went to MD earlier today and she states he is pleased with progress.   Currently in Pain? No/denies         TODAY'S TREATMENT TherEx - NuStep lvl 3, 4' TRX DL Squat 10x Stretch B HS, Piri, SKTC Bridge 15x Stretch Prone knee flexion (tight B)  Manual - STM and MFR  anteriolateral hip and into thigh especially over scar   TherEx -  Prone R SLR 10x (mild anterior hip pulling pain) Side-Lying Hip ABD AROM 12x B Knee Ext Machine 15# 2x12 Standing Hip ABD, Ext, and Flexion (knee flexed) Green TB 12x each with no greater than SBA                PT Long Term Goals - 06/06/15 1130    PT LONG TERM GOAL #1   Title pt independent with advanced HEP as necessary by 06/20/15   Status On-going   PT LONG TERM GOAL #2   Title B hip and knee MMT 4+/5 or better grossly by 06/20/15   Status On-going   PT LONG TERM GOAL #3   Title pt able to safely ambulate up/down  stairs with reciprocal gait and single rail use by 06/20/15   Status On-going   PT LONG TERM GOAL #4   Title pt able to perform all transfers (sit->stand and car transfers) safely, easily, with minimal to no need for UE by 06/20/15  still noting difficulty with car transfers but sit<->stand  no problem without UE Assist               Plan - 06/26/15 1539    Clinical Impression Statement Doing well with regard to pain, states still has difficutly with top step on staircases.  Still weak with L Hip flexion but seems to be improving in that able to perform supine with minimal ER and performed standing with Green TB at ankle and nearly as high lift as on R.   PT Next Visit Plan L Knee flexion strengthening, B hip stability; Knee strengthening PRN; Prone Knee Flexion stretching   Consulted and Agree with Plan of Care Patient        Problem List Patient Active Problem List   Diagnosis Date Noted  . Osteoarthritis of right hip 02/17/2015  . 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 Atrial tachycardia ([place]) 12/12/2014  . 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 Recurrent dislocation of hip 10/17/2014  . Dislocation of internal left hip prosthesis ([place]) 10/16/2014  . Ectopic atrial rhythm 10/12/2014  . Atrial bigeminy 10/12/2014  . Osteoarthritis of left hip 09/16/2014  . Status post total replacement of left hip 09/16/2014  . ASCUS favoring benign 09/05/2014  . History of vitamin B deficiency 05/02/2014  . Acute bronchitis 04/18/2014  . Oral aphthous ulcer 04/18/2014  . Edema 12/21/2013  . Irregular heart beat 12/21/2013  . Anuria 11/12/2013  . History of cervical dysplasia 09/21/2013  . Rectocele, female 09/21/2013  . Kidney stone 09/21/2013  . Rash and nonspecific skin eruption 06/25/2013  . Obesity (BMI 30-39.9) 06/16/2013  . IBS (irritable bowel syndrome) 12/22/2012  . Other sleep disturbances 10/27/2012  . Elevated BP 09/29/2012  .  Weight gain 08/17/2012  . Osteopenia 08/17/2012  . Vulvar lesion 08/10/2012  . Tuberous sclerosis ([place]) 07/14/2012  . Condyloma acuminatum in female 07/14/2012  . Shingles 07/14/2012  . Vitamin D deficiency 07/14/2012    Lesle Faron PT, OCS 06/26/2015, 4:23 PM  [facility] [address]  [place], [place], [address] Phone: [phone]   Fax:  [phone]  Name: [name] MRN: [id number] Date of Birth: Jul 20, 1945

## 2015-06-27 ENCOUNTER — Ambulatory Visit: Payer: Medicare Other | Admitting: Physical Therapy

## 2015-06-27 DIAGNOSIS — M6289 Other specified disorders of muscle: Secondary | ICD-10-CM | POA: Diagnosis not present

## 2015-06-27 DIAGNOSIS — R262 Difficulty in walking, not elsewhere classified: Secondary | ICD-10-CM | POA: Diagnosis not present

## 2015-06-27 DIAGNOSIS — R29898 Other symptoms and signs involving the musculoskeletal system: Secondary | ICD-10-CM

## 2015-06-27 DIAGNOSIS — M25562 Pain in left knee: Secondary | ICD-10-CM | POA: Diagnosis not present

## 2015-06-27 DIAGNOSIS — M25561 Pain in right knee: Secondary | ICD-10-CM | POA: Diagnosis not present

## 2015-06-27 NOTE — Therapy (Signed)
[facility] [address]  [place], [place], [address] Phone: [phone]   Fax:  [phone]  Physical Therapy Treatment  Patient Details  Name: [name] MRN: [id number] Date of Birth: 12-25-1945 Referring Provider: Jean Rosenthal  Encounter Date: 06/27/2015      PT End of Session - 06/27/15 1549    Visit Number 8   Number of Visits 12   Date for PT Re-Evaluation 07/04/15   PT Start Time [id number]   PT Stop Time 1618   PT Time Calculation (min) 45 min      Past Medical History  Diagnosis Date  . Arthritis   . Diverticulosis   . GERD (gastroesophageal reflux disease)   . Osteoporosis   . Tuberous sclerosis (HCC)     EXTERNAL LESIONS--  MAINLY HANDS (INTERNAL SCLEROTIC BONY LESIONS LUMBAR & PELVIS PER CT 11/2013)  . Seasonal allergies   . H/O hiatal hernia   . History of diverticulitis of colon   . Nodule of right lung     BENIGN--- AND STABLE PER  CT  11/2013  . History of gastric ulcer   . History of epilepsy     childhood age 40 to 64  ---secondary to high calcium deposts of optic nerve---  none since age 62 with pregency  . IBS (irritable bowel syndrome)   . History of kidney stones   . History of shingles     MARCH 2014--  BACK AREA  . History of vulvar dysplasia     S/P  LASER ABLATION 2014  . Right ureteral stone   . Ureteral stenosis, right   . DDD (degenerative disc disease), lumbosacral   . Renal cyst, left     STABLE PER CT 11/2013  . PONV (postoperative nausea and vomiting)     occurred back in 1970's   . Tachycardia   . Mild obstructive sleep apnea     SLEEP STUDY  11-19-2012--  NO CPAP RX (DID NOT MEET CRITIRIA)  . Shortness of breath dyspnea     walking distances and climbing stairs  . Bronchitis     hx of   . Phlebitis     hx of   . Dysrhythmia   . Anginal pain ([place])     hx of pt relates to stress last occurrence 8 to 10 years ago; pt states has now been diagnosed  with tachycardia last episode of chest pain 1 month ago   . Pneumonia     hx of twice   . Bronchitis     hx of   . Falls     hx of   . Impaired gait and mobility   . Seizures [facility])     Past Surgical History  Procedure Laterality Date  . Bladder surgery  1991    bladder reconstruction of torsion  . Colonoscopy    . Upper gastrointestinal endoscopy    . Cataract extraction w/ intraocular lens  implant, bilateral    . Cholecystectomy  11-18-2003  . Cardiac catheterization  06-26-2001    NORMAL CORONARY ARTERIES  . Orif right bimalleolar ankle fx  12-18-2003  . 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 Lumbar disc surgery  1985  . Vaginal hysterectomy  1975  . Hemorrhoid surgery  1970's  . Co2 laser application N/A [id number]    Procedure: CO2 LASER APPLICATION;  Surgeon: Terrance Mass, MD;  Location: [facility];  Service: Gynecology;  Laterality: N/A;CO2 laser of vaginal and vulvar lesions  . Gynecologic cryosurgery    . Dilation and curettage of uterus  multiple -- last one 1975  . Augmentation mammaplasty      SALINE  . Cystoscopy with retrograde pyelogram, ureteroscopy and stent placement Right 11/22/2013    Procedure: CYSTOSCOPY WITH RETROGRADE PYELOGRAM, URETEROSCOPY AND STENT PLACEMENT;  Surgeon: Sharyn Creamer, MD;  Location: [facility];  Service: Urology;  Laterality: Right;  . Cystoscopy/retrograde/ureteroscopy Right 12/01/2013    Procedure: CYSTOSCOPY, RIGHT RETROGRADE/RIGHT DIGITAL URETEROSCOPY, RIGHT URETERAL STENT EXCHANGE;  Surgeon: Sharyn Creamer, MD;  Location: [facility];  Service: Urology;  Laterality: Right;  STAGED RIGHT URETEROSCOPY RIGHT URETER DILATION    . Holmium laser application Right [id number]    Procedure: HOLMIUM LASER APPLICATION;  Surgeon: Sharyn Creamer, MD;  Location: [facility];  Service: Urology;  Laterality: Right;  . Total  hip arthroplasty Left 09/16/2014    Procedure: LEFT TOTAL HIP ARTHROPLASTY ANTERIOR APPROACH;  Surgeon: Mcarthur Rossetti, MD;  Location: [facility];  Service: Orthopedics;  Laterality: Left;  . Hip closed reduction Left 10/16/2014    Procedure: CLOSED MANIPULATION HIP;  Surgeon: Mcarthur Rossetti, MD;  Location: [facility];  Service: Orthopedics;  Laterality: Left;  . Anterior hip revision Left 10/18/2014    Procedure: EXCHANGE LEFT HIP BALL OF DIRECT ANTERIOR TOTAL HIP ARTHROPLASTY;  Surgeon: Mcarthur Rossetti, MD;  Location: [facility];  Service: Orthopedics;  Laterality: Left;  . Total hip arthroplasty Right 02/17/2015    Procedure: RIGHT TOTAL HIP ARTHROPLASTY ANTERIOR APPROACH;  Surgeon: Mcarthur Rossetti, MD;  Location: [facility];  Service: Orthopedics;  Laterality: Right;    There were no vitals filed for this visit.  Visit Diagnosis:  Difficulty walking up stairs  Weakness of both hips      Subjective Assessment - 06/27/15 1549    Subjective States is feeling good today, no pain and denies soreness from yesterday's treatment.   Currently in Pain? No/denies            [facility] PT Assessment - 06/27/15 0001    ROM / Strength   AROM / PROM / Strength Strength   Strength   Strength Assessment Site Hip   Right/Left Hip Right;Left   Right Hip Flexion 4+/5   Right Hip External Rotation  4/5   Right Hip Internal Rotation 5/5   Left Hip Flexion 4/5  but AROM 2" lower than R in sitting   Left Hip External Rotation 4+/5   Left Hip Internal Rotation 5/5          TODAY'S TREATMENT TherEx - Rec Bike no resistance 3'  Manual - supine B Patellar inferior and medial glides grade 3, STM / scar massage throughout B anteriolateral hip  TherEx - SKTC Stretch Bridge 15x Hooklying Hip ABD Black TB 15x Bridge with Unilateral Hip ABD Black TB 3x5 each B FOB (55cm) Tuck 10x B FOB (55cm) Bridge 10x 8" Step Toe-tapping 10x each with SBA 6" ALT Step-up 5x each with SBA 8" ALT  Step-up 5x each (initially unable to perform leading with L but after practicing eccentric on L was able to perform stepping up with L with only SBA)  Gait in halls and up/down stairs between 2nd and 3rd floor - able to perform recip up/down with single rail use, able to perform step-to with either leg without rail use.  B Hip  MMT assessment           PT Long Term Goals - 06/06/15 1130    PT LONG TERM GOAL #1   Title pt independent with advanced HEP as necessary by 06/20/15   Status On-going   PT LONG TERM GOAL #2   Title B hip and knee MMT 4+/5 or better grossly by 06/20/15   Status On-going   PT LONG TERM GOAL #3   Title pt able to safely ambulate up/down stairs with reciprocal gait and single rail use by 06/20/15   Status On-going   PT LONG TERM GOAL #4   Title pt able to perform all transfers (sit->stand and car transfers) safely, easily, with minimal to no need for UE by 06/20/15  still noting difficulty with car transfers but sit<->stand no problem without UE Assist               Plan - 06/27/15 1618    Clinical Impression Statement good performance today - able to perform step-ups with 6" step with only SBA and able to perform same on 8" after practicing eccentrically on L first. Then able to ascend and descend stairs in building with recip gait with light rail use or able to perform step-to with either leg without rail use.  Very good progress overall.   PT Next Visit Plan L Knee flexion strengthening, B hip stability; Knee strengthening PRN; Prone Knee Flexion stretching   Consulted and Agree with Plan of Care Patient        Problem List Patient Active Problem List   Diagnosis Date Noted  . Osteoarthritis of right hip 02/17/2015  . 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 Atrial tachycardia ([place]) 12/12/2014  . 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 Recurrent dislocation of hip 10/17/2014  . Dislocation of internal  left hip prosthesis ([place]) 10/16/2014  . Ectopic atrial rhythm 10/12/2014  . Atrial bigeminy 10/12/2014  . Osteoarthritis of left hip 09/16/2014  . Status post total replacement of left hip 09/16/2014  . ASCUS favoring benign 09/05/2014  . History of vitamin B deficiency 05/02/2014  . Acute bronchitis 04/18/2014  . Oral aphthous ulcer 04/18/2014  . Edema 12/21/2013  . Irregular heart beat 12/21/2013  . Anuria 11/12/2013  . History of cervical dysplasia 09/21/2013  . Rectocele, female 09/21/2013  . Kidney stone 09/21/2013  . Rash and nonspecific skin eruption 06/25/2013  . Obesity (BMI 30-39.9) 06/16/2013  . IBS (irritable bowel syndrome) 12/22/2012  . Other sleep disturbances 10/27/2012  . Elevated BP 09/29/2012  . Weight gain 08/17/2012  . Osteopenia 08/17/2012  . Vulvar lesion 08/10/2012  . Tuberous sclerosis ([place]) 07/14/2012  . Condyloma acuminatum in female 07/14/2012  . Shingles 07/14/2012  . Vitamin D deficiency 07/14/2012    Ngina Royer PT, OCS 06/27/2015, 4:21 PM  [facility] [address]  [place], [place], [address] Phone: [phone]   Fax:  [phone]  Name: CARLECIA CORADO MRN: [id number] Date of Birth: 05-11-1946

## 2015-07-03 ENCOUNTER — Ambulatory Visit: Payer: Medicare Other

## 2015-07-03 DIAGNOSIS — R262 Difficulty in walking, not elsewhere classified: Secondary | ICD-10-CM | POA: Diagnosis not present

## 2015-07-03 DIAGNOSIS — M25561 Pain in right knee: Secondary | ICD-10-CM | POA: Diagnosis not present

## 2015-07-03 DIAGNOSIS — M25562 Pain in left knee: Secondary | ICD-10-CM | POA: Diagnosis not present

## 2015-07-03 DIAGNOSIS — R29898 Other symptoms and signs involving the musculoskeletal system: Secondary | ICD-10-CM

## 2015-07-03 DIAGNOSIS — M6289 Other specified disorders of muscle: Secondary | ICD-10-CM | POA: Diagnosis not present

## 2015-07-03 NOTE — Therapy (Signed)
[facility] [address]  [place], [place], [address] Phone: [phone]   Fax:  [phone]  Physical Therapy Treatment  Patient Details  Name: [name] MRN: [id number] Date of Birth: Mar 12, 1946 Referring Provider: Jean Rosenthal  Encounter Date: 07/03/2015      PT End of Session - 07/03/15 1417    Visit Number 9   Number of Visits 12   Date for PT Re-Evaluation 07/04/15   PT Start Time 1401   PT Stop Time 1445   PT Time Calculation (min) 44 min   Activity Tolerance Patient tolerated treatment well      Past Medical History  Diagnosis Date  . Arthritis   . Diverticulosis   . GERD (gastroesophageal reflux disease)   . Osteoporosis   . Tuberous sclerosis (HCC)     EXTERNAL LESIONS--  MAINLY HANDS (INTERNAL SCLEROTIC BONY LESIONS LUMBAR & PELVIS PER CT 11/2013)  . Seasonal allergies   . H/O hiatal hernia   . History of diverticulitis of colon   . Nodule of right lung     BENIGN--- AND STABLE PER  CT  11/2013  . History of gastric ulcer   . History of epilepsy     childhood age 47 to 59  ---secondary to high calcium deposts of optic nerve---  none since age 74 with pregency  . IBS (irritable bowel syndrome)   . History of kidney stones   . History of shingles     MARCH 2014--  BACK AREA  . History of vulvar dysplasia     S/P  LASER ABLATION 2014  . Right ureteral stone   . Ureteral stenosis, right   . DDD (degenerative disc disease), lumbosacral   . Renal cyst, left     STABLE PER CT 11/2013  . PONV (postoperative nausea and vomiting)     occurred back in 1970's   . Tachycardia   . Mild obstructive sleep apnea     SLEEP STUDY  11-19-2012--  NO CPAP RX (DID NOT MEET CRITIRIA)  . Shortness of breath dyspnea     walking distances and climbing stairs  . Bronchitis     hx of   . Phlebitis     hx of   . Dysrhythmia   . Anginal pain ([place])     hx of pt relates to stress last occurrence  8 to 10 years ago; pt states has now been diagnosed with tachycardia last episode of chest pain 1 month ago   . Pneumonia     hx of twice   . Bronchitis     hx of   . Falls     hx of   . Impaired gait and mobility   . Seizures [facility])     Past Surgical History  Procedure Laterality Date  . Bladder surgery  1991    bladder reconstruction of torsion  . Colonoscopy    . Upper gastrointestinal endoscopy    . Cataract extraction w/ intraocular lens  implant, bilateral    . Cholecystectomy  11-18-2003  . Cardiac catheterization  06-26-2001    NORMAL CORONARY ARTERIES  . Orif right bimalleolar ankle fx  12-18-2003  . 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 Lumbar disc surgery  1985  . Vaginal hysterectomy  1975  . Hemorrhoid surgery  1970's  . Co2 laser application N/A [id number]    Procedure: CO2  LASER APPLICATION;  Surgeon: Terrance Mass, MD;  Location: [facility];  Service: Gynecology;  Laterality: N/A;CO2 laser of vaginal and vulvar lesions  . Gynecologic cryosurgery    . Dilation and curettage of uterus  multiple -- last one 1975  . Augmentation mammaplasty      SALINE  . Cystoscopy with retrograde pyelogram, ureteroscopy and stent placement Right 11/22/2013    Procedure: CYSTOSCOPY WITH RETROGRADE PYELOGRAM, URETEROSCOPY AND STENT PLACEMENT;  Surgeon: Sharyn Creamer, MD;  Location: [facility];  Service: Urology;  Laterality: Right;  . Cystoscopy/retrograde/ureteroscopy Right 12/01/2013    Procedure: CYSTOSCOPY, RIGHT RETROGRADE/RIGHT DIGITAL URETEROSCOPY, RIGHT URETERAL STENT EXCHANGE;  Surgeon: Sharyn Creamer, MD;  Location: [facility];  Service: Urology;  Laterality: Right;  STAGED RIGHT URETEROSCOPY RIGHT URETER DILATION    . Holmium laser application Right [id number]    Procedure: HOLMIUM LASER APPLICATION;  Surgeon: Sharyn Creamer, MD;  Location[facility];  Service: Urology;  Laterality: Right;  . Total hip arthroplasty Left 09/16/2014    Procedure: LEFT TOTAL HIP ARTHROPLASTY ANTERIOR APPROACH;  Surgeon: Mcarthur Rossetti, MD;  Location: [facility];  Service: Orthopedics;  Laterality: Left;  . Hip closed reduction Left 10/16/2014    Procedure: CLOSED MANIPULATION HIP;  Surgeon: Mcarthur Rossetti, MD;  Location: [facility];  Service: Orthopedics;  Laterality: Left;  . Anterior hip revision Left 10/18/2014    Procedure: EXCHANGE LEFT HIP BALL OF DIRECT ANTERIOR TOTAL HIP ARTHROPLASTY;  Surgeon: Mcarthur Rossetti, MD;  Location: [facility];  Service: Orthopedics;  Laterality: Left;  . Total hip arthroplasty Right 02/17/2015    Procedure: RIGHT TOTAL HIP ARTHROPLASTY ANTERIOR APPROACH;  Surgeon: Mcarthur Rossetti, MD;  Location: [facility];  Service: Orthopedics;  Laterality: Right;    There were no vitals filed for this visit.  Visit Diagnosis:  Weakness of both hips  Difficulty walking up stairs      Subjective Assessment - 07/03/15 1457    Subjective Pt. states,"my low back has been cramping today and my feet too.".   Pertinent History L THA April and revision in May 2016 then R THA Sept 2016.   Currently in Pain? No/denies   Pain Score 0-No pain   Multiple Pain Sites No            [facility] PT Assessment - 07/03/15 0001    Strength   Right Hip ABduction 4/5   Left Hip ABduction 4+/5   Right Knee Extension 4+/5   Left Knee Extension 4+/5     TODAY'S TREATMENT  TherEx (to improve bilateral hip and knee strength and stability to improve activity tolerance): - Bil SKTC Stretch x 30 sec  - Prone lying R hip flexor stretch x 30 sec  - Bridge 15x - Hooklying Hip ABD Black TB 15x - Standing hip abduction / extension - Bridge with R Unilateral Hip ABD Black TB 3x5  - Bil FOB (55cm) Tuck 10x - Bil FOB (55cm) Bridge 10x - Standing hip abduction / extension next to counter x 10 reps each leg; 2 UE support on counter; verbal  cues provided to maintain upright posture. - 6" Step-up 10x each with SBA; verbal cues provided to encourage slow controlled movement. - 4" side step ups R and L x 10 reps; 2 UE supports with black poles.   - single leg stance on blue foam pad 2 x 20 secs; 1 UE support - Leg curl machine #15  2  x 15 reps; verbal cues provided to encourage slow eccentric control.        PT Long Term Goals - 07/03/15 1422    PT LONG TERM GOAL #1   Title --   Status --   PT LONG TERM GOAL #2   Title --   Status --   PT LONG TERM GOAL #3   Title --   Status --   PT LONG TERM GOAL #4   Title --   Status --               Plan - 07/03/15 1501    Clinical Impression Statement Pt. tolerated increased repetitions with stepping exercises but continues to demo limited hip stability during single leg stance activities.  Leg curl machine added today for knee flexion strengthening; pt. demo'd understanding of technique.  Verbal cues required throughout therex to prevent supplemental movements and improve technique.  Pt. continues to improve Bilateral hip / knee strength however would benefit from further single leg / hip stability training.     PT Next Visit Plan L Knee flexion strengthening, B hip stability; Knee strengthening PRN; Continued prone Knee Flexion stretching.  Single leg stance stability training on compliant surfaces.  Continue with POC.      Consulted and Agree with Plan of Care Patient        Problem List Patient Active Problem List   Diagnosis Date Noted  . Osteoarthritis of right hip 02/17/2015  . 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 Atrial tachycardia ([place]) 12/12/2014  . 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 Recurrent dislocation of hip 10/17/2014  . Dislocation of internal left hip prosthesis ([place]) 10/16/2014  . Ectopic atrial rhythm 10/12/2014  . Atrial bigeminy 10/12/2014  . Osteoarthritis of left hip 09/16/2014  . Status post  total replacement of left hip 09/16/2014  . ASCUS favoring benign 09/05/2014  . History of vitamin B deficiency 05/02/2014  . Acute bronchitis 04/18/2014  . Oral aphthous ulcer 04/18/2014  . Edema 12/21/2013  . Irregular heart beat 12/21/2013  . Anuria 11/12/2013  . History of cervical dysplasia 09/21/2013  . Rectocele, female 09/21/2013  . Kidney stone 09/21/2013  . Rash and nonspecific skin eruption 06/25/2013  . Obesity (BMI 30-39.9) 06/16/2013  . IBS (irritable bowel syndrome) 12/22/2012  . Other sleep disturbances 10/27/2012  . Elevated BP 09/29/2012  . Weight gain 08/17/2012  . Osteopenia 08/17/2012  . Vulvar lesion 08/10/2012  . Tuberous sclerosis ([place]) 07/14/2012  . Condyloma acuminatum in female 07/14/2012  . Shingles 07/14/2012  . Vitamin D deficiency 07/14/2012    Bess Harvest, PTA  07/03/2015, 5:19 PM   Name: JOSI HUMISTON MRN: [id number] Date of Birth: 06-25-1945

## 2015-07-04 ENCOUNTER — Ambulatory Visit: Payer: Medicare Other | Admitting: Physical Therapy

## 2015-07-04 DIAGNOSIS — M25562 Pain in left knee: Secondary | ICD-10-CM

## 2015-07-04 DIAGNOSIS — M25561 Pain in right knee: Secondary | ICD-10-CM | POA: Diagnosis not present

## 2015-07-04 DIAGNOSIS — R29898 Other symptoms and signs involving the musculoskeletal system: Secondary | ICD-10-CM

## 2015-07-04 DIAGNOSIS — R262 Difficulty in walking, not elsewhere classified: Secondary | ICD-10-CM | POA: Diagnosis not present

## 2015-07-04 DIAGNOSIS — M6289 Other specified disorders of muscle: Secondary | ICD-10-CM | POA: Diagnosis not present

## 2015-07-04 NOTE — Therapy (Signed)
[facility] [address]  [place], [place], [address] Phone: [phone]   Fax:  [phone]  Physical Therapy Treatment  Patient Details  Name: [name] MRN: [id number] Date of Birth: Mar 19, 1946 Referring Provider: Jean Rosenthal  Encounter Date: 07/04/2015      PT End of Session - 07/04/15 1409    Visit Number 10   Number of Visits 12   Date for PT Re-Evaluation 07/04/15   PT Start Time 1406   PT Stop Time 1448   PT Time Calculation (min) 42 min      Past Medical History  Diagnosis Date  . Arthritis   . Diverticulosis   . GERD (gastroesophageal reflux disease)   . Osteoporosis   . Tuberous sclerosis (HCC)     EXTERNAL LESIONS--  MAINLY HANDS (INTERNAL SCLEROTIC BONY LESIONS LUMBAR & PELVIS PER CT 11/2013)  . Seasonal allergies   . H/O hiatal hernia   . History of diverticulitis of colon   . Nodule of right lung     BENIGN--- AND STABLE PER  CT  11/2013  . History of gastric ulcer   . History of epilepsy     childhood age 89 to 27  ---secondary to high calcium deposts of optic nerve---  none since age 26 with pregency  . IBS (irritable bowel syndrome)   . History of kidney stones   . History of shingles     MARCH 2014--  BACK AREA  . History of vulvar dysplasia     S/P  LASER ABLATION 2014  . Right ureteral stone   . Ureteral stenosis, right   . DDD (degenerative disc disease), lumbosacral   . Renal cyst, left     STABLE PER CT 11/2013  . PONV (postoperative nausea and vomiting)     occurred back in 1970's   . Tachycardia   . Mild obstructive sleep apnea     SLEEP STUDY  11-19-2012--  NO CPAP RX (DID NOT MEET [facility])  . Shortness of breath dyspnea     walking distances and climbing stairs  . Bronchitis     hx of   . Phlebitis     hx of   . Dysrhythmia   . Anginal pain ([place])     hx of pt relates to stress last occurrence 8 to 10 years ago; pt states has now been diagnosed  with tachycardia last episode of chest pain 1 month ago   . Pneumonia     hx of twice   . Bronchitis     hx of   . Falls     hx of   . Impaired gait and mobility   . Seizures [facility])     Past Surgical History  Procedure Laterality Date  . Bladder surgery  1991    bladder reconstruction of torsion  . Colonoscopy    . Upper gastrointestinal endoscopy    . Cataract extraction w/ intraocular lens  implant, bilateral    . Cholecystectomy  11-18-2003  . Cardiac catheterization  06-26-2001    NORMAL CORONARY ARTERIES  . Orif right bimalleolar ankle fx  12-18-2003  . 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 Lumbar disc surgery  1985  . Vaginal hysterectomy  1975  . Hemorrhoid surgery  1970's  . Co2 laser application N/A [id number]    Procedure: CO2 LASER APPLICATION;  Surgeon: Terrance Mass, MD;  Location: [facility];  Service: Gynecology;  Laterality: N/A;CO2 laser of vaginal and vulvar lesions  . Gynecologic cryosurgery    . Dilation and curettage of uterus  multiple -- last one 1975  . Augmentation mammaplasty      SALINE  . Cystoscopy with retrograde pyelogram, ureteroscopy and stent placement Right 11/22/2013    Procedure: CYSTOSCOPY WITH RETROGRADE PYELOGRAM, URETEROSCOPY AND STENT PLACEMENT;  Surgeon: Sharyn Creamer, MD;  Location: [facility];  Service: Urology;  Laterality: Right;  . Cystoscopy/retrograde/ureteroscopy Right 12/01/2013    Procedure: CYSTOSCOPY, RIGHT RETROGRADE/RIGHT DIGITAL URETEROSCOPY, RIGHT URETERAL STENT EXCHANGE;  Surgeon: Sharyn Creamer, MD;  Location: [facility];  Service: Urology;  Laterality: Right;  STAGED RIGHT URETEROSCOPY RIGHT URETER DILATION    . Holmium laser application Right [id number]    Procedure: HOLMIUM LASER APPLICATION;  Surgeon: Sharyn Creamer, MD;  Location[facility];  Service: Urology;  Laterality: Right;  . Total  hip arthroplasty Left 09/16/2014    Procedure: LEFT TOTAL HIP ARTHROPLASTY ANTERIOR APPROACH;  Surgeon: Mcarthur Rossetti, MD;  Location: [facility];  Service: Orthopedics;  Laterality: Left;  . Hip closed reduction Left 10/16/2014    Procedure: CLOSED MANIPULATION HIP;  Surgeon: Mcarthur Rossetti, MD;  Location: [facility];  Service: Orthopedics;  Laterality: Left;  . Anterior hip revision Left 10/18/2014    Procedure: EXCHANGE LEFT HIP BALL OF DIRECT ANTERIOR TOTAL HIP ARTHROPLASTY;  Surgeon: Mcarthur Rossetti, MD;  Location: [facility];  Service: Orthopedics;  Laterality: Left;  . Total hip arthroplasty Right 02/17/2015    Procedure: RIGHT TOTAL HIP ARTHROPLASTY ANTERIOR APPROACH;  Surgeon: Mcarthur Rossetti, MD;  Location: [facility];  Service: Orthopedics;  Laterality: Right;    There were no vitals filed for this visit.  Visit Diagnosis:  Weakness of both hips  Difficulty walking up stairs  Knee pain, bilateral      Subjective Assessment - 07/13/2015 1408    Subjective no c/o pain but states L lower back still with intermittent catching sensation   Currently in Pain? No/denies            TODAY'S TREATMENT TherEx - Bridge 15x Stretch HS, SKTC, Piriformis  Manual - MFR and stretch B hip flexors with pt in supine Mod Thomas  TherEx - B FOB (55cm) B LE Tuck against mild Resistance by PT 9" FW Step-up with Single Pole A 6x each (greater difficulty with R stepping) Doorway Lunges 8x (partial depth) 8" step ALT toe-tapping 10x each with SBA 8" ALT Step-up 4x each with SBA Side-stepping at counter with Yellow TB at ankles 3 laps Monster Walk Yellow TB at ankles                   Plan - 13-Jul-2015 1452    Clinical Impression Statement performed very well today and no pain following yesterday's treatment.  Still with greater weakness and less function in L hip vs R but is able to step up 8" step with either leg without UE assist.   PT Next Visit Plan L Knee  flexion strengthening, B hip stability; Knee strengthening PRN; Continued prone Knee Flexion stretching.  Single leg stance stability training on compliant surfaces.  Continue with POC.      Consulted and Agree with Plan of Care Patient          G-Codes - July 13, 2015 1453    Functional Assessment Tool Used Foto 44% limitation   Functional Limitation  Mobility: Walking and moving around   Mobility: Walking and Moving Around Current Status [phone]) At least 40 percent but less than 60 percent impaired, limited or restricted   Mobility: Walking and Moving Around Goal Status [phone]) At least 40 percent but less than 60 percent impaired, limited or restricted      Problem List Patient Active Problem List   Diagnosis Date Noted  . Osteoarthritis of right hip 02/17/2015  . 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 Atrial tachycardia ([place]) 12/12/2014  . 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 Recurrent dislocation of hip 10/17/2014  . Dislocation of internal left hip prosthesis ([place]) 10/16/2014  . Ectopic atrial rhythm 10/12/2014  . Atrial bigeminy 10/12/2014  . Osteoarthritis of left hip 09/16/2014  . Status post total replacement of left hip 09/16/2014  . ASCUS favoring benign 09/05/2014  . History of vitamin B deficiency 05/02/2014  . Acute bronchitis 04/18/2014  . Oral aphthous ulcer 04/18/2014  . Edema 12/21/2013  . Irregular heart beat 12/21/2013  . Anuria 11/12/2013  . History of cervical dysplasia 09/21/2013  . Rectocele, female 09/21/2013  . Kidney stone 09/21/2013  . Rash and nonspecific skin eruption 06/25/2013  . Obesity (BMI 30-39.9) 06/16/2013  . IBS (irritable bowel syndrome) 12/22/2012  . Other sleep disturbances 10/27/2012  . Elevated BP 09/29/2012  . Weight gain 08/17/2012  . Osteopenia 08/17/2012  . Vulvar lesion 08/10/2012  . Tuberous sclerosis ([place]) 07/14/2012  . Condyloma acuminatum in female 07/14/2012  . Shingles  07/14/2012  . Vitamin D deficiency 07/14/2012    Nikolaus Pienta PT, OCS 07/04/2015, 2:56 PM  [facility] [address]  [place], [place], [address] Phone: [phone]   Fax:  [phone]  Name: [name] MRN: [id number] Date of Birth: 1945-08-16

## 2015-07-10 ENCOUNTER — Ambulatory Visit: Payer: Medicare Other | Attending: Orthopaedic Surgery

## 2015-07-10 DIAGNOSIS — M6289 Other specified disorders of muscle: Secondary | ICD-10-CM | POA: Diagnosis not present

## 2015-07-10 DIAGNOSIS — M25562 Pain in left knee: Secondary | ICD-10-CM | POA: Diagnosis not present

## 2015-07-10 DIAGNOSIS — M6281 Muscle weakness (generalized): Secondary | ICD-10-CM | POA: Insufficient documentation

## 2015-07-10 DIAGNOSIS — R262 Difficulty in walking, not elsewhere classified: Secondary | ICD-10-CM | POA: Insufficient documentation

## 2015-07-10 DIAGNOSIS — M25561 Pain in right knee: Secondary | ICD-10-CM | POA: Insufficient documentation

## 2015-07-10 DIAGNOSIS — R29898 Other symptoms and signs involving the musculoskeletal system: Secondary | ICD-10-CM

## 2015-07-10 NOTE — Therapy (Signed)
[facility] [address]  [place], [place], [address] Phone: [phone]   Fax:  [phone]  Physical Therapy Treatment  Patient Details  Name: [name] MRN: [id number] Date of Birth: 1945-08-24 Referring Provider: Jean Rosenthal  Encounter Date: 07/10/2015      PT End of Session - 07/10/15 1452    Visit Number 11   Number of Visits 12   Date for PT Re-Evaluation 07/04/15   PT Start Time 1453  Pt. originally scheduled for 1:15pm time today but arrived late to therapy and was transfered to 2:45 time    PT Stop Time 1532   PT Time Calculation (min) 39 min   Activity Tolerance Patient tolerated treatment well   Behavior During Therapy [facility] for tasks assessed/performed      Past Medical History  Diagnosis Date  . Arthritis   . Diverticulosis   . GERD (gastroesophageal reflux disease)   . Osteoporosis   . Tuberous sclerosis (HCC)     EXTERNAL LESIONS--  MAINLY HANDS (INTERNAL SCLEROTIC BONY LESIONS LUMBAR & PELVIS PER CT 11/2013)  . Seasonal allergies   . H/O hiatal hernia   . History of diverticulitis of colon   . Nodule of right lung     BENIGN--- AND STABLE PER  CT  11/2013  . History of gastric ulcer   . History of epilepsy     childhood age 70 to 70  ---secondary to high calcium deposts of optic nerve---  none since age 70 with pregency  . IBS (irritable bowel syndrome)   . History of kidney stones   . History of shingles     MARCH 2014--  BACK AREA  . History of vulvar dysplasia     S/P  LASER ABLATION 2014  . Right ureteral stone   . Ureteral stenosis, right   . DDD (degenerative disc disease), lumbosacral   . Renal cyst, left     STABLE PER CT 11/2013  . PONV (postoperative nausea and vomiting)     occurred back in 1970's   . Tachycardia   . Mild obstructive sleep apnea     SLEEP STUDY  11-19-2012--  NO CPAP RX (DID NOT MEET [facility]IRIA)  . Shortness of breath dyspnea     walking  distances and climbing stairs  . Bronchitis     hx of   . Phlebitis     hx of   . Dysrhythmia   . Anginal pain ([place])     hx of pt relates to stress last occurrence 8 to 10 years ago; pt states has now been diagnosed with tachycardia last episode of chest pain 1 month ago   . Pneumonia     hx of twice   . Bronchitis     hx of   . Falls     hx of   . Impaired gait and mobility   . Seizures [facility])     Past Surgical History  Procedure Laterality Date  . Bladder surgery  1991    bladder reconstruction of torsion  . Colonoscopy    . Upper gastrointestinal endoscopy    . Cataract extraction w/ intraocular lens  implant, bilateral    . Cholecystectomy  11-18-2003  . Cardiac catheterization  06-26-2001    NORMAL CORONARY ARTERIES  . Orif right bimalleolar ankle fx  12-18-2003  . 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  Lumbar disc surgery  1985  . Vaginal hysterectomy  1975  . Hemorrhoid surgery  1970's  . Co2 laser application N/A [id number]    Procedure: CO2 LASER APPLICATION;  Surgeon: Terrance Mass, MD;  Location: [facility];  Service: Gynecology;  Laterality: N/A;CO2 laser of vaginal and vulvar lesions  . Gynecologic cryosurgery    . Dilation and curettage of uterus  multiple -- last one 1975  . Augmentation mammaplasty      SALINE  . Cystoscopy with retrograde pyelogram, ureteroscopy and stent placement Right 11/22/2013    Procedure: CYSTOSCOPY WITH RETROGRADE PYELOGRAM, URETEROSCOPY AND STENT PLACEMENT;  Surgeon: Sharyn Creamer, MD;  Location: [facility];  Service: Urology;  Laterality: Right;  . Cystoscopy/retrograde/ureteroscopy Right 12/01/2013    Procedure: CYSTOSCOPY, RIGHT RETROGRADE/RIGHT DIGITAL URETEROSCOPY, RIGHT URETERAL STENT EXCHANGE;  Surgeon: Sharyn Creamer, MD;  Location: [facility];  Service: Urology;  Laterality: Right;  STAGED RIGHT URETEROSCOPY RIGHT URETER  DILATION    . Holmium laser application Right [id number]    Procedure: HOLMIUM LASER APPLICATION;  Surgeon: Sharyn Creamer, MD;  Location: [facility];  Service: Urology;  Laterality: Right;  . Total hip arthroplasty Left 09/16/2014    Procedure: LEFT TOTAL HIP ARTHROPLASTY ANTERIOR APPROACH;  Surgeon: Mcarthur Rossetti, MD;  Location: [facility];  Service: Orthopedics;  Laterality: Left;  . Hip closed reduction Left 10/16/2014    Procedure: CLOSED MANIPULATION HIP;  Surgeon: Mcarthur Rossetti, MD;  Location: [facility];  Service: Orthopedics;  Laterality: Left;  . Anterior hip revision Left 10/18/2014    Procedure: EXCHANGE LEFT HIP BALL OF DIRECT ANTERIOR TOTAL HIP ARTHROPLASTY;  Surgeon: Mcarthur Rossetti, MD;  Location: [facility];  Service: Orthopedics;  Laterality: Left;  . Total hip arthroplasty Right 02/17/2015    Procedure: RIGHT TOTAL HIP ARTHROPLASTY ANTERIOR APPROACH;  Surgeon: Mcarthur Rossetti, MD;  Location: [facility];  Service: Orthopedics;  Laterality: Right;    There were no vitals filed for this visit.  Visit Diagnosis:  Weakness of both hips  Difficulty walking up stairs      Subjective Assessment - 07/10/15 1542    Subjective Pt. reports intermittent LB catching sensation while walking.     Patient Stated Goals be able to get up and down without limit by knee pain   Currently in Pain? No/denies   Pain Score 0-No pain    TODAY'S TREATMENT  TherEx - Bridge 15x - Stretch HS, SKTC, Piriformis x 30 sec - stretch B hip flexors with pt in supine Mod Thomas - bridging x 4 reps; exercise suspended due to LB pain  - B FOB (55cm) B LE Tuck against mild Resistance by PT x 15 reps  - 6" FW Step-up with Single Pole A 8x each (greater difficulty with R stepping) - B Standing hip extension / abduction / adduction with red TB x 15 reps  - 6" step ALT toe-tapping 2 x 10 each with SBA - 8" step ALT toe-tapping 2 x 10 each with SBA - 4" side step-ups x 10 reps  each leg  - 6" side step-up x 10 with R hip; 4" side step up with L x 15        PT Long Term Goals - 07/03/15 1422    PT LONG TERM GOAL #1   Title --   Status --   PT LONG TERM GOAL #2   Title --   Status --   PT  LONG TERM GOAL #3   Title --   Status --   PT LONG TERM GOAL #4   Title --   Status --               Plan - 07/10/15 1548    Clinical Impression Statement Pt. continues to demo greater weakness and less function in L hip vs R.  Intermittent LB pain with bridging and standing hip extension exercises.  Hip extension exercises performed at pt. tolerance.  Pt. with limited R hip flexion during step up exercises.  Pt. tolerated addition of side step up well in session.      PT Next Visit Plan L Knee flexion strengthening, B hip stability; Knee strengthening PRN; Continued prone Knee Flexion stretching.  Single leg stance stability training on compliant surfaces.  Continue with POC.      Consulted and Agree with Plan of Care Patient        Problem List Patient Active Problem List   Diagnosis Date Noted  . Osteoarthritis of right hip 02/17/2015  . 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 Atrial tachycardia ([place]) 12/12/2014  . 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 Recurrent dislocation of hip 10/17/2014  . Dislocation of internal left hip prosthesis ([place]) 10/16/2014  . Ectopic atrial rhythm 10/12/2014  . Atrial bigeminy 10/12/2014  . Osteoarthritis of left hip 09/16/2014  . Status post total replacement of left hip 09/16/2014  . ASCUS favoring benign 09/05/2014  . History of vitamin B deficiency 05/02/2014  . Acute bronchitis 04/18/2014  . Oral aphthous ulcer 04/18/2014  . Edema 12/21/2013  . Irregular heart beat 12/21/2013  . Anuria 11/12/2013  . History of cervical dysplasia 09/21/2013  . Rectocele, female 09/21/2013  . Kidney stone 09/21/2013  . Rash and nonspecific skin eruption 06/25/2013  . Obesity (BMI  30-39.9) 06/16/2013  . IBS (irritable bowel syndrome) 12/22/2012  . Other sleep disturbances 10/27/2012  . Elevated BP 09/29/2012  . Weight gain 08/17/2012  . Osteopenia 08/17/2012  . Vulvar lesion 08/10/2012  . Tuberous sclerosis ([place]) 07/14/2012  . Condyloma acuminatum in female 07/14/2012  . Shingles 07/14/2012  . Vitamin D deficiency 07/14/2012    Bess Harvest, PTA 07/10/2015, 5:06 PM  [facility] [address]  [place], [place], [address] Phone: [phone]   Fax:  [phone]  Name: RELLA MISEK MRN: [id number] Date of Birth: 06-Jan-1946

## 2015-07-11 ENCOUNTER — Ambulatory Visit: Payer: Medicare Other | Admitting: Physical Therapy

## 2015-07-11 DIAGNOSIS — M25562 Pain in left knee: Secondary | ICD-10-CM | POA: Diagnosis not present

## 2015-07-11 DIAGNOSIS — R262 Difficulty in walking, not elsewhere classified: Secondary | ICD-10-CM

## 2015-07-11 DIAGNOSIS — R29898 Other symptoms and signs involving the musculoskeletal system: Secondary | ICD-10-CM

## 2015-07-11 DIAGNOSIS — M6281 Muscle weakness (generalized): Secondary | ICD-10-CM | POA: Diagnosis not present

## 2015-07-11 DIAGNOSIS — M25561 Pain in right knee: Secondary | ICD-10-CM | POA: Diagnosis not present

## 2015-07-11 DIAGNOSIS — M6289 Other specified disorders of muscle: Secondary | ICD-10-CM | POA: Diagnosis not present

## 2015-07-11 NOTE — Therapy (Signed)
[facility] [address]  [place], [place], [address] Phone: [phone]   Fax:  [phone]  Physical Therapy Treatment  Patient Details  Name: [name] MRN: [id number] Date of Birth: [birth year] Referring Provider: Jean Rosenthal  Encounter Date: 07/11/2015      PT End of Session - 07/11/15 1027    Visit Number 12   Number of Visits 20   Date for PT Re-Evaluation 08/08/15   PT Start Time 1025   PT Stop Time 1110   PT Time Calculation (min) 45 min      Past Medical History  Diagnosis Date  . Arthritis   . Diverticulosis   . GERD (gastroesophageal reflux disease)   . Osteoporosis   . Tuberous sclerosis (HCC)     EXTERNAL LESIONS--  MAINLY HANDS (INTERNAL SCLEROTIC BONY LESIONS LUMBAR & PELVIS PER CT 11/2013)  . Seasonal allergies   . H/O hiatal hernia   . History of diverticulitis of colon   . Nodule of right lung     BENIGN--- AND STABLE PER  CT  11/2013  . History of gastric ulcer   . History of epilepsy     childhood age 70 to 57  ---secondary to high calcium deposts of optic nerve---  none since age 51 with pregency  . IBS (irritable bowel syndrome)   . History of kidney stones   . History of shingles     MARCH 2014--  BACK AREA  . History of vulvar dysplasia     S/P  LASER ABLATION 2014  . Right ureteral stone   . Ureteral stenosis, right   . DDD (degenerative disc disease), lumbosacral   . Renal cyst, left     STABLE PER CT 11/2013  . PONV (postoperative nausea and vomiting)     occurred back in 1970's   . Tachycardia   . Mild obstructive sleep apnea     SLEEP STUDY  11-19-2012--  NO CPAP RX (DID NOT MEET CRITIRIA)  . Shortness of breath dyspnea     walking distances and climbing stairs  . Bronchitis     hx of   . Phlebitis     hx of   . Dysrhythmia   . Anginal pain ([place])     hx of pt relates to stress last occurrence 8 to 10 years ago; pt states has now been diagnosed  with tachycardia last episode of chest pain 1 month ago   . Pneumonia     hx of twice   . Bronchitis     hx of   . Falls     hx of   . Impaired gait and mobility   . Seizures [facility])     Past Surgical History  Procedure Laterality Date  . Bladder surgery  1991    bladder reconstruction of torsion  . Colonoscopy    . Upper gastrointestinal endoscopy    . Cataract extraction w/ intraocular lens  implant, bilateral    . Cholecystectomy  11-18-2003  . Cardiac catheterization  06-26-2001    NORMAL CORONARY ARTERIES  . Orif right bimalleolar ankle fx  12-18-2003  . 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 Lumbar disc surgery  1985  . Vaginal hysterectomy  1975  . Hemorrhoid surgery  1970's  . Co2 laser application N/A 02/05/8015    Procedure: CO2 LASER APPLICATION;  Surgeon: Terrance Mass, MD;  Location: [facility];  Service: Gynecology;  Laterality: N/A;CO2 laser of vaginal and vulvar lesions  . Gynecologic cryosurgery    . Dilation and curettage of uterus  multiple -- last one 1975  . Augmentation mammaplasty      SALINE  . Cystoscopy with retrograde pyelogram, ureteroscopy and stent placement Right 11/22/2013    Procedure: CYSTOSCOPY WITH RETROGRADE PYELOGRAM, URETEROSCOPY AND STENT PLACEMENT;  Surgeon: Sharyn Creamer, MD;  Location: [facility];  Service: Urology;  Laterality: Right;  . Cystoscopy/retrograde/ureteroscopy Right 12/01/2013    Procedure: CYSTOSCOPY, RIGHT RETROGRADE/RIGHT DIGITAL URETEROSCOPY, RIGHT URETERAL STENT EXCHANGE;  Surgeon: Sharyn Creamer, MD;  Location: [facility];  Service: Urology;  Laterality: Right;  STAGED RIGHT URETEROSCOPY RIGHT URETER DILATION    . Holmium laser application Right 08/08/3336    Procedure: HOLMIUM LASER APPLICATION;  Surgeon: Sharyn Creamer, MD;  Location: [facility];  Service: Urology;  Laterality: Right;  . Total  hip arthroplasty Left 09/16/2014    Procedure: LEFT TOTAL HIP ARTHROPLASTY ANTERIOR APPROACH;  Surgeon: Mcarthur Rossetti, MD;  Location: [facility];  Service: Orthopedics;  Laterality: Left;  . Hip closed reduction Left 10/16/2014    Procedure: CLOSED MANIPULATION HIP;  Surgeon: Mcarthur Rossetti, MD;  [facility];  Service: Orthopedics;  Laterality: Left;  . Anterior hip revision Left 10/18/2014    Procedure: EXCHANGE LEFT HIP BALL OF DIRECT ANTERIOR TOTAL HIP ARTHROPLASTY;  Surgeon: Mcarthur Rossetti, MD;  Location: [place];  Service: Orthopedics;  Laterality: Left;  . Total hip arthroplasty Right 02/17/2015    Procedure: RIGHT TOTAL HIP ARTHROPLASTY ANTERIOR APPROACH;  Surgeon: Mcarthur Rossetti, MD;  [facility];  Service: Orthopedics;  Laterality: Right;    There were no vitals filed for this visit.  Visit Diagnosis:  Weakness of both hips  Difficulty walking up stairs      Subjective Assessment - 07/11/15 1027    Subjective States noted some increased L hip pain following treatments last week then noted a further increase after experiencing a near fall at work last Wednesday.  States hasn't been able to lie on L side lately due to pain.   Currently in Pain? Yes   Pain Score --  6/10   Pain Location Hip   Pain Orientation Left;Lateral;Posterior            [facility] PT Assessment - 07/11/15 0001    ROM / Strength   AROM / PROM / Strength Strength   Strength   Right Hip Extension 4/5   Left Hip Extension 4+/5  R LBP   Right Knee Flexion 4+/5   Left Knee Flexion 4+/5       TODAY'S TREATMENT TherEx - Bridge 15x Stretch HS, SKTC, Piriformis  Manual - MFR and stretch B hip flexors with pt in supine Mod Thomas Strumming L ITB in R Side-Lying TPR L TFL and prox glutes  TherEx - Seated Hip ABD Black TB 15x (added to HEP)        PT Education - 07/11/15 1750    Education provided Yes   Education Details Seated Hip ABD Black TB   Person(s)  Educated Patient   Methods Explanation;Demonstration   Comprehension Verbalized understanding;Returned demonstration             PT Long Term Goals - 07/11/15 1318    PT LONG TERM GOAL #1   Title pt independent with advanced HEP as necessary by 06/20/15   Status  On-going   PT LONG TERM GOAL #2   Title B hip and knee MMT 4+/5 or better grossly by 06/20/15  Met other than L Hip Flexion, R Hip Extension, R Hip ABD, and R Hip ER   Status Partially Met   PT LONG TERM GOAL #3   Title pt able to safely ambulate up/down stairs with reciprocal gait and single rail use  achieved in clinic and no issues outside of clinic other than pt reports difficutly with top step of staircase at times   Status Partially Met   PT LONG TERM GOAL #4   Title pt able to perform all transfers (sit->stand and car transfers) safely, easily, with minimal to no need for UE by 06/20/15   Status Partially Met               Plan - 07/11/15 1319    Clinical Impression Statement Ms. Calo reports improvement in B knee pain stating function not currently limited by knee pain.  However, she continues to note some limitation with level of fuction related to hip weakness and intermittent pain.  L hip is weak into Flexion (4/5) and R Hip is weak into Ext, ABD, and ER (4/5) each.  She is able to ascend/descend stairs with reciprocal gait and single rail use and is able to perform 6" and 8" step-ups with B LE in clinic yet she continues to c/o difficulty with stepping up last step in staircases.  This seems more of a mental block at this time and we are addressing through repetition to decrease anxiety with stairs.  She reports continued difficulty with transfering into truck/van at work due to height of vehicle.  She c/o increased pain in L hip over the past week.  Pain in located in proximal glutes and in TFL. She states she experienced a near fall at work and had to quickly shift wt onto L hip which seems to have caused a  mild strain.  She has attended 12 treatments to date and would like to continue with PT with hope of making transfers and stairs easier.  I'm recommending continued PT 2x/wk for 2-4 wks.   Pt will benefit from skilled therapeutic intervention in order to improve on the following deficits Pain;Decreased strength;Decreased mobility;Decreased balance;Difficulty walking   PT Frequency 2x / week   PT Duration --  2-4wks   PT Treatment/Interventions Therapeutic exercise;Manual techniques;Therapeutic activities;Scar mobilization;Taping;Vasopneumatic Device;Patient/family education;Balance training;Gait training;Stair training;Functional mobility training;Electrical Stimulation;Iontophoresis 8m/ml Dexamethasone;Moist Heat;Cryotherapy   PT Next Visit Plan L Knee flexion strengthening, B hip stability; Knee strengthening PRN; Continued prone Knee Flexion stretching.  Single leg stance stability training on compliant surfaces.   Consulted and Agree with Plan of Care Patient        Problem List Patient Active Problem List   Diagnosis Date Noted  . Osteoarthritis of right hip 02/17/2015  . 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 Atrial tachycardia ([place] 12/12/2014  . 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 Recurrent dislocation of hip 10/17/2014  . Dislocation of internal left hip prosthesis ([place] 10/16/2014  . Ectopic atrial rhythm 10/12/2014  . Atrial bigeminy 10/12/2014  . Osteoarthritis of left hip 09/16/2014  . Status post total replacement of left hip 09/16/2014  . ASCUS favoring benign 09/05/2014  . History of vitamin B deficiency 05/02/2014  . Acute bronchitis 04/18/2014  . Oral aphthous ulcer 04/18/2014  . Edema 12/21/2013  . Irregular heart beat 12/21/2013  . Anuria  11/12/2013  . History of cervical dysplasia 09/21/2013  . Rectocele, female 09/21/2013  . Kidney stone 09/21/2013  . Rash and nonspecific skin eruption 06/25/2013  . Obesity  (BMI 30-39.9) 06/16/2013  . IBS (irritable bowel syndrome) 12/22/2012  . Other sleep disturbances 10/27/2012  . Elevated BP 09/29/2012  . Weight gain 08/17/2012  . Osteopenia 08/17/2012  . Vulvar lesion 08/10/2012  . Tuberous sclerosis ([place]) 07/14/2012  . Condyloma acuminatum in female 07/14/2012  . Shingles 07/14/2012  . Vitamin D deficiency 07/14/2012    Brookley Spitler PT, OCS 07/11/2015, 5:52 PM  [facility] [address]  [place], [place], [address] Phone: [phone]   Fax:  [phone]  Name: ZULLY FRANE MRN: [id number] Date of Birth: 26-Jun-1945

## 2015-07-17 ENCOUNTER — Ambulatory Visit: Payer: Medicare Other

## 2015-07-17 DIAGNOSIS — R262 Difficulty in walking, not elsewhere classified: Secondary | ICD-10-CM | POA: Diagnosis not present

## 2015-07-17 DIAGNOSIS — I4891 Unspecified atrial fibrillation: Secondary | ICD-10-CM | POA: Diagnosis not present

## 2015-07-17 DIAGNOSIS — N200 Calculus of kidney: Secondary | ICD-10-CM | POA: Diagnosis not present

## 2015-07-17 DIAGNOSIS — K219 Gastro-esophageal reflux disease without esophagitis: Secondary | ICD-10-CM | POA: Diagnosis not present

## 2015-07-17 DIAGNOSIS — M25561 Pain in right knee: Secondary | ICD-10-CM | POA: Diagnosis not present

## 2015-07-17 DIAGNOSIS — Z6836 Body mass index (BMI) 36.0-36.9, adult: Secondary | ICD-10-CM | POA: Diagnosis not present

## 2015-07-17 DIAGNOSIS — M6281 Muscle weakness (generalized): Secondary | ICD-10-CM | POA: Diagnosis not present

## 2015-07-17 DIAGNOSIS — Z87898 Personal history of other specified conditions: Secondary | ICD-10-CM | POA: Diagnosis not present

## 2015-07-17 DIAGNOSIS — M6289 Other specified disorders of muscle: Secondary | ICD-10-CM | POA: Diagnosis not present

## 2015-07-17 DIAGNOSIS — M25562 Pain in left knee: Secondary | ICD-10-CM | POA: Diagnosis not present

## 2015-07-17 DIAGNOSIS — E669 Obesity, unspecified: Secondary | ICD-10-CM | POA: Diagnosis not present

## 2015-07-17 DIAGNOSIS — R29898 Other symptoms and signs involving the musculoskeletal system: Secondary | ICD-10-CM

## 2015-07-17 DIAGNOSIS — M810 Age-related osteoporosis without current pathological fracture: Secondary | ICD-10-CM | POA: Diagnosis not present

## 2015-07-17 NOTE — Therapy (Signed)
[facility] [address]  [address] [place], [place], [address] Phone: [phone]   Fax:  [phone]  Physical Therapy Treatment  Patient Details  Name: [name] MRN: [id number] Date of Birth: [birth year] Referring Provider: Doneen Poisson  Encounter Date: 07/17/2015      PT End of Session - 07/17/15 1404    Visit Number 13   Number of Visits 20   Date for PT Re-Evaluation 08/08/15   PT Start Time 1401   PT Stop Time 1445   PT Time Calculation (min) 44 min   Activity Tolerance Patient tolerated treatment well   Behavior During Therapy [facility] for tasks assessed/performed      Past Medical History  Diagnosis Date  . Arthritis   . Diverticulosis   . GERD (gastroesophageal reflux disease)   . Osteoporosis   . Tuberous sclerosis (HCC)     EXTERNAL LESIONS--  MAINLY HANDS (INTERNAL SCLEROTIC BONY LESIONS LUMBAR & PELVIS PER CT 11/2013)  . Seasonal allergies   . H/O hiatal hernia   . History of diverticulitis of colon   . Nodule of right lung     BENIGN--- AND STABLE PER  CT  11/2013  . History of gastric ulcer   . History of epilepsy     childhood age 69 to 11  ---secondary to high calcium deposts of optic nerve---  none since age 41 with pregency  . IBS (irritable bowel syndrome)   . History of kidney stones   . History of shingles     MARCH 2014--  BACK AREA  . History of vulvar dysplasia     S/P  LASER ABLATION 2014  . Right ureteral stone   . Ureteral stenosis, right   . DDD (degenerative disc disease), lumbosacral   . Renal cyst, left     STABLE PER CT 11/2013  . PONV (postoperative nausea and vomiting)     occurred back in 1970's   . Tachycardia   . Mild obstructive sleep apnea     SLEEP STUDY  11-19-2012--  NO CPAP RX (DID NOT MEET CRITIRIA)  . Shortness of breath dyspnea     walking distances and climbing stairs  . Bronchitis     hx of   . Phlebitis     hx of   . Dysrhythmia   .  Anginal pain (HCC)     hx of pt relates to stress last occurrence 8 to 10 years ago; pt states has now been diagnosed with tachycardia last episode of chest pain 1 month ago   . Pneumonia     hx of twice   . Bronchitis     hx of   . Falls     hx of   . Impaired gait and mobility   . Seizures [facility])     Past Surgical History  Procedure Laterality Date  . Bladder surgery  1991    bladder reconstruction of torsion  . Colonoscopy    . Upper gastrointestinal endoscopy    . Cataract extraction w/ intraocular lens  implant, bilateral    . Cholecystectomy  11-18-2003  . Cardiac catheterization  06-26-2001    NORMAL CORONARY ARTERIES  . Orif right bimalleolar ankle fx  12-18-2003  . 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 Lumbar disc surgery  1985  . Vaginal hysterectomy  1975  . Hemorrhoid surgery  1970's  . Co2  laser application N/A 12/07/5463    Procedure: CO2 LASER APPLICATION;  Surgeon: Terrance Mass, MD;  Location[facility];  Service: Gynecology;  Laterality: N/A;CO2 laser of vaginal and vulvar lesions  . Gynecologic cryosurgery    . Dilation and curettage of uterus  multiple -- last one 1975  . Augmentation mammaplasty      SALINE  . Cystoscopy with retrograde pyelogram, ureteroscopy and stent placement Right 11/22/2013    Procedure: CYSTOSCOPY WITH RETROGRADE PYELOGRAM, URETEROSCOPY AND STENT PLACEMENT;  Surgeon: Sharyn Creamer, MD;  [facility];  Service: Urology;  Laterality: Right;  . Cystoscopy/retrograde/ureteroscopy Right 12/01/2013    Procedure: CYSTOSCOPY, RIGHT RETROGRADE/RIGHT DIGITAL URETEROSCOPY, RIGHT URETERAL STENT EXCHANGE;  Surgeon: Sharyn Creamer, MD;  Location: [facility];  Service: Urology;  Laterality: Right;  STAGED RIGHT URETEROSCOPY RIGHT URETER DILATION    . Holmium laser application Right 0/08/5463    Procedure: HOLMIUM LASER APPLICATION;   Surgeon: Sharyn Creamer, MD;  Location: [facility];  Service: Urology;  Laterality: Right;  . Total hip arthroplasty Left 09/16/2014    Procedure: LEFT TOTAL HIP ARTHROPLASTY ANTERIOR APPROACH;  Surgeon: Mcarthur Rossetti, MD;  [facility];  Service: Orthopedics;  Laterality: Left;  . Hip closed reduction Left 10/16/2014    Procedure: CLOSED MANIPULATION HIP;  Surgeon: Mcarthur Rossetti, MD;  [facility];  Service: Orthopedics;  Laterality: Left;  . Anterior hip revision Left 10/18/2014    Procedure: EXCHANGE LEFT HIP BALL OF DIRECT ANTERIOR TOTAL HIP ARTHROPLASTY;  Surgeon: Mcarthur Rossetti, MD;  Location: [place];  Service: Orthopedics;  Laterality: Left;  . Total hip arthroplasty Right 02/17/2015    Procedure: RIGHT TOTAL HIP ARTHROPLASTY ANTERIOR APPROACH;  Surgeon: Mcarthur Rossetti, MD;  [facility];  Service: Orthopedics;  Laterality: Right;    There were no vitals filed for this visit.  Visit Diagnosis:  Weakness of both hips  Difficulty walking up stairs      Subjective Assessment - 07/17/15 1448    Subjective States noted some increased L hip pain following L side lying a few nights ago.  Pt. reports a 2/10 pain at the L hip currently at rest.     Patient Stated Goals be able to get up and down without limit by knee pain   Currently in Pain? Yes   Pain Score 2    Pain Location Hip   Pain Orientation Left;Posterior;Lateral   Pain Descriptors / Indicators Aching   Pain Type Acute pain   Pain Radiating Towards n/a   Pain Frequency Intermittent   Aggravating Factors  up / down stairs.   Pain Relieving Factors medication, rest   Multiple Pain Sites No     TODAY'S TREATMENT NuStep 4 level, 38mn TherEx - Bridge 2 x 15 Stretch R HS, SKTC, Piriformis x 30 sec each  Strumming L ITB in R Side-Lying Hook-lying clam shells with green TB x 15 reps  Supine marching 2 x 10 each leg Bridge with isometric and ER 5 x 3 each leg with  green TB around knees Step ups forward 2 x 10 reps each way on 6" step  Side step ups x 10 reps each way 4" step  Step step ups x 10 reps each way 6" step  Fitter 2 blue bands B hip extension x 10 each side        PT Long Term Goals - 07/11/15 1318    PT LONG  TERM GOAL #1   Title pt independent with advanced HEP as necessary by 08/08/15   Status On-going   PT LONG TERM GOAL #2   Title B hip and knee MMT 4+/5 or better grossly by 08/08/15  Met other than L Hip Flexion, R Hip Extension, R Hip ABD, and R Hip ER   Status Partially Met   PT LONG TERM GOAL #3   Title pt able to safely ambulate up/down stairs with reciprocal gait and single rail use by 08/08/15  achieved in clinic and no issues outside of clinic other than pt reports difficutly with top step of staircase at times   Status Partially Met   PT LONG TERM GOAL #4   Title pt able to perform all transfers (sit->stand and car transfers) safely, easily, with minimal to no need for UE by 08/08/15   Status Partially Met               Plan - 07/17/15 1451    Clinical Impression Statement Pt. continues to demo L hip flexion weakness with 8" step ups however tolerated B hip strengthening in todays treatment well with only minimal pain complaint at L hip.  No increase in L hip pain following treatment.  Pt. continues to report occasional calf and LB cramping with therex that quickly subsides with rest.     Pt will benefit from skilled therapeutic intervention in order to improve on the following deficits Pain;Decreased strength;Decreased mobility;Decreased balance;Difficulty walking   PT Frequency 2x / week   PT Duration --  2-4wks   PT Treatment/Interventions Therapeutic exercise;Manual techniques;Therapeutic activities;Scar mobilization;Taping;Vasopneumatic Device;Patient/family education;Balance training;Gait training;Stair training;Functional mobility training;Electrical Stimulation;Iontophoresis 10m/ml Dexamethasone;Moist  Heat;Cryotherapy   PT Next Visit Plan L Knee flexion strengthening, B hip stability; Knee strengthening PRN; Continued prone Knee Flexion stretching.  Single leg stance stability training on compliant surfaces.   Consulted and Agree with Plan of Care Patient        Problem List Patient Active Problem List   Diagnosis Date Noted  . Osteoarthritis of right hip 02/17/2015  . 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 Atrial tachycardia ([place] 12/12/2014  . 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 Recurrent dislocation of hip 10/17/2014  . Dislocation of internal left hip prosthesis ([place] 10/16/2014  . Ectopic atrial rhythm 10/12/2014  . Atrial bigeminy 10/12/2014  . Osteoarthritis of left hip 09/16/2014  . Status post total replacement of left hip 09/16/2014  . ASCUS favoring benign 09/05/2014  . History of vitamin B deficiency 05/02/2014  . Acute bronchitis 04/18/2014  . Oral aphthous ulcer 04/18/2014  . Edema 12/21/2013  . Irregular heart beat 12/21/2013  . Anuria 11/12/2013  . History of cervical dysplasia 09/21/2013  . Rectocele, female 09/21/2013  . Kidney stone 09/21/2013  . Rash and nonspecific skin eruption 06/25/2013  . Obesity (BMI 30-39.9) 06/16/2013  . IBS (irritable bowel syndrome) 12/22/2012  . Other sleep disturbances 10/27/2012  . Elevated BP 09/29/2012  . Weight gain 08/17/2012  . Osteopenia 08/17/2012  . Vulvar lesion 08/10/2012  . Tuberous sclerosis ([place] 07/14/2012  . Condyloma acuminatum in female 07/14/2012  . Shingles 07/14/2012  . Vitamin D deficiency 07/14/2012    MBess Harvest PTA 07/17/2015, 3:05 PM  [facility] [place] [address]Phone: [phone]  Fax:  [phone] Name: Samantha CHAMPINEMRN: [id number] of Birth: [birth year]

## 2015-07-18 ENCOUNTER — Ambulatory Visit: Payer: Medicare Other

## 2015-07-18 DIAGNOSIS — K219 Gastro-esophageal reflux disease without esophagitis: Secondary | ICD-10-CM | POA: Diagnosis not present

## 2015-07-18 DIAGNOSIS — M6289 Other specified disorders of muscle: Secondary | ICD-10-CM | POA: Diagnosis not present

## 2015-07-18 DIAGNOSIS — R262 Difficulty in walking, not elsewhere classified: Secondary | ICD-10-CM | POA: Diagnosis not present

## 2015-07-18 DIAGNOSIS — N898 Other specified noninflammatory disorders of vagina: Secondary | ICD-10-CM | POA: Diagnosis not present

## 2015-07-18 DIAGNOSIS — R609 Edema, unspecified: Secondary | ICD-10-CM | POA: Diagnosis not present

## 2015-07-18 DIAGNOSIS — M6281 Muscle weakness (generalized): Secondary | ICD-10-CM | POA: Diagnosis not present

## 2015-07-18 DIAGNOSIS — R29898 Other symptoms and signs involving the musculoskeletal system: Secondary | ICD-10-CM

## 2015-07-18 DIAGNOSIS — I4891 Unspecified atrial fibrillation: Secondary | ICD-10-CM | POA: Diagnosis not present

## 2015-07-18 DIAGNOSIS — M810 Age-related osteoporosis without current pathological fracture: Secondary | ICD-10-CM | POA: Diagnosis not present

## 2015-07-18 DIAGNOSIS — M25561 Pain in right knee: Secondary | ICD-10-CM | POA: Diagnosis not present

## 2015-07-18 DIAGNOSIS — M25562 Pain in left knee: Secondary | ICD-10-CM | POA: Diagnosis not present

## 2015-07-18 DIAGNOSIS — Q851 Tuberous sclerosis: Secondary | ICD-10-CM | POA: Diagnosis not present

## 2015-07-18 NOTE — Therapy (Signed)
[facility] [address]  [place], [place], [address] Phone: [phone]   Fax:  [phone]  Physical Therapy Treatment  Patient Details  Name: [name] MRN: [id number] Date of Birth: 1945/10/06 Referring Provider: Jean Rosenthal  Encounter Date: 07/18/2015      PT End of Session - 07/18/15 1403    Visit Number 14   Number of Visits 20   Date for PT Re-Evaluation 08/08/15   PT Start Time 1404   PT Stop Time 1447   PT Time Calculation (min) 43 min   Activity Tolerance Patient tolerated treatment well   Behavior During Therapy [facility] for tasks assessed/performed      Past Medical History  Diagnosis Date  . Arthritis   . Diverticulosis   . GERD (gastroesophageal reflux disease)   . Osteoporosis   . Tuberous sclerosis (HCC)     EXTERNAL LESIONS--  MAINLY HANDS (INTERNAL SCLEROTIC BONY LESIONS LUMBAR & PELVIS PER CT 11/2013)  . Seasonal allergies   . H/O hiatal hernia   . History of diverticulitis of colon   . Nodule of right lung     BENIGN--- AND STABLE PER  CT  11/2013  . History of gastric ulcer   . History of epilepsy     childhood age 45 to 16  ---secondary to high calcium deposts of optic nerve---  none since age 47 with pregency  . IBS (irritable bowel syndrome)   . History of kidney stones   . History of shingles     MARCH 2014--  BACK AREA  . History of vulvar dysplasia     S/P  LASER ABLATION 2014  . Right ureteral stone   . Ureteral stenosis, right   . DDD (degenerative disc disease), lumbosacral   . Renal cyst, left     STABLE PER CT 11/2013  . PONV (postoperative nausea and vomiting)     occurred back in 1970's   . Tachycardia   . Mild obstructive sleep apnea     SLEEP STUDY  11-19-2012--  NO CPAP RX (DID NOT MEET CR[facility]IRIA)  . Shortness of breath dyspnea     walking distances and climbing stairs  . Bronchitis     hx of   . Phlebitis     hx of   . Dysrhythmia   .  Anginal pain ([place])     hx of pt relates to stress last occurrence 8 to 10 years ago; pt states has now been diagnosed with tachycardia last episode of chest pain 1 month ago   . Pneumonia     hx of twice   . Bronchitis     hx of   . Falls     hx of   . Impaired gait and mobility   . Seizures [facility])     Past Surgical History  Procedure Laterality Date  . Bladder surgery  1991    bladder reconstruction of torsion  . Colonoscopy    . Upper gastrointestinal endoscopy    . Cataract extraction w/ intraocular lens  implant, bilateral    . Cholecystectomy  11-18-2003  . Cardiac catheterization  06-26-2001    NORMAL CORONARY ARTERIES  . Orif right bimalleolar ankle fx  12-18-2003  . 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 Lumbar disc surgery  1985  . Vaginal hysterectomy  1975  . Hemorrhoid surgery  1970's  . Co2  laser application N/A 02/05/6386    Procedure: CO2 LASER APPLICATION;  Surgeon: Terrance Mass, MD;  Location: [facility];  Service: Gynecology;  Laterality: N/A;CO2 laser of vaginal and vulvar lesions  . Gynecologic cryosurgery    . Dilation and curettage of uterus  multiple -- last one 1975  . Augmentation mammaplasty      SALINE  . Cystoscopy with retrograde pyelogram, ureteroscopy and stent placement Right 11/22/2013    Procedure: CYSTOSCOPY WITH RETROGRADE PYELOGRAM, URETEROSCOPY AND STENT PLACEMENT;  Surgeon: Sharyn Creamer, MD;  Location: [facility];  Service: Urology;  Laterality: Right;  . Cystoscopy/retrograde/ureteroscopy Right 12/01/2013    Procedure: CYSTOSCOPY, RIGHT RETROGRADE/RIGHT DIGITAL URETEROSCOPY, RIGHT URETERAL STENT EXCHANGE;  Surgeon: Sharyn Creamer, MD;  Location: [facility];  Service: Urology;  Laterality: Right;  STAGED RIGHT URETEROSCOPY RIGHT URETER DILATION    . Holmium laser application Right 10/07/4330    Procedure: HOLMIUM LASER APPLICATION;   Surgeon: Sharyn Creamer, MD;  [facility];  Service: Urology;  Laterality: Right;  . Total hip arthroplasty Left 09/16/2014    Procedure: LEFT TOTAL HIP ARTHROPLASTY ANTERIOR APPROACH;  Surgeon: Mcarthur Rossetti, MD;  Location: [facility];  Service: Orthopedics;  Laterality: Left;  . Hip closed reduction Left 10/16/2014    Procedure: CLOSED MANIPULATION HIP;  Surgeon: Mcarthur Rossetti, MD;  Location: [facility];  Service: Orthopedics;  Laterality: Left;  . Anterior hip revision Left 10/18/2014    Procedure: EXCHANGE LEFT HIP BALL OF DIRECT ANTERIOR TOTAL HIP ARTHROPLASTY;  Surgeon: Mcarthur Rossetti, MD;  Location: [facility];  Service: Orthopedics;  Laterality: Left;  . Total hip arthroplasty Right 02/17/2015    Procedure: RIGHT TOTAL HIP ARTHROPLASTY ANTERIOR APPROACH;  Surgeon: Mcarthur Rossetti, MD;  Location: [facility];  Service: Orthopedics;  Laterality: Right;    There were no vitals filed for this visit.  Visit Diagnosis:  Weakness of both hips  Difficulty walking up stairs      Subjective Assessment - 07/18/15 1407    Subjective Pt. states, "I don't feel 100% today, my left knee is sore.".  No other complaints reported.   Currently in Pain? Yes   Pain Score 4    Pain Orientation Left;Lateral   Pain Descriptors / Indicators Aching   Pain Type Acute pain   Pain Radiating Towards n/a   Pain Frequency Intermittent   Aggravating Factors  up / down stairs    Pain Relieving Factors medication, rest   Multiple Pain Sites No      TODAY'S TREATMENT  TherEx  NuStep 4 level, 53mn Bridge 2 x 10 Strumming L ITB in R Side-Lying x 1 min  Hook-lying clam shells with blue TB x 15 reps  Standing hip abduction 2 x 15 reps; 2 UE on chair  Standing hip extension 2 x 15 reps; 2 UE on chair  Standing hip flexion 2 x 15 reps; 2 UE on chair  Leg curl machine 15 x 15# Leg extension machine 15 x 15# B Standing calf stretch x 30 sec  B SKTC, HS, piriformis  stretch x 30 sec        PT Long Term Goals - 07/11/15 1318    PT LONG TERM GOAL #1   Title pt independent with advanced HEP as necessary by 08/08/15   Status On-going   PT LONG TERM GOAL #2   Title B hip and knee MMT 4+/5 or better grossly by 08/08/15  Met  other than L Hip Flexion, R Hip Extension, R Hip ABD, and R Hip ER   Status Partially Met   PT LONG TERM GOAL #3   Title pt able to safely ambulate up/down stairs with reciprocal gait and single rail use by 08/08/15  achieved in clinic and no issues outside of clinic other than pt reports difficutly with top step of staircase at times   Status Partially Met   PT LONG TERM GOAL #4   Title pt able to perform all transfers (sit->stand and car transfers) safely, easily, with minimal to no need for UE by 08/08/15   Status Partially Met           Plan - 07/18/15 1449    Clinical Impression Statement Today's session focused on B hip strengthening.  Pt. tolerated all single leg hip strengthening activities with only intermittent low back pain relieved by rest.  L knee pain dminished in session from initial report.  Pt. able to ascend / descend 6 stairs with reciprocal gait pattern using one handrail with only minimal hip instability noted.  Pt. with improved hip strength in todays session, progressing toward goals.       Pt will benefit from skilled therapeutic intervention in order to improve on the following deficits --   PT Frequency --   PT Duration --  2-4wks   PT Treatment/Interventions --   PT Next Visit Plan L Knee flexion strengthening, B hip stability; Knee strengthening PRN; Continued prone Knee Flexion stretching.  Single leg stance stability training on compliant surfaces.   Consulted and Agree with Plan of Care Patient        Problem List Patient Active Problem List   Diagnosis Date Noted  . Osteoarthritis of right hip 02/17/2015  . 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 Atrial tachycardia ([place])  12/12/2014  . 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 Recurrent dislocation of hip 10/17/2014  . Dislocation of internal left hip prosthesis ([place]) 10/16/2014  . Ectopic atrial rhythm 10/12/2014  . Atrial bigeminy 10/12/2014  . Osteoarthritis of left hip 09/16/2014  . Status post total replacement of left hip 09/16/2014  . ASCUS favoring benign 09/05/2014  . History of vitamin B deficiency 05/02/2014  . Acute bronchitis 04/18/2014  . Oral aphthous ulcer 04/18/2014  . Edema 12/21/2013  . Irregular heart beat 12/21/2013  . Anuria 11/12/2013  . History of cervical dysplasia 09/21/2013  . Rectocele, female 09/21/2013  . Kidney stone 09/21/2013  . Rash and nonspecific skin eruption 06/25/2013  . Obesity (BMI 30-39.9) 06/16/2013  . IBS (irritable bowel syndrome) 12/22/2012  . Other sleep disturbances 10/27/2012  . Elevated BP 09/29/2012  . Weight gain 08/17/2012  . Osteopenia 08/17/2012  . Vulvar lesion 08/10/2012  . Tuberous sclerosis ([place]) 07/14/2012  . Condyloma acuminatum in female 07/14/2012  . Shingles 07/14/2012  . Vitamin D deficiency 07/14/2012    Bess Harvest, PTA 07/18/2015, 2:59 PM  [facility] [address]  [place], [place], [address] Phone: [phone]   Fax:  [phone]  Name: MAKALA FETTEROLF MRN: [id number] Date of Birth: [birth year]

## 2015-07-24 ENCOUNTER — Ambulatory Visit: Payer: Medicare Other

## 2015-07-24 DIAGNOSIS — R262 Difficulty in walking, not elsewhere classified: Secondary | ICD-10-CM | POA: Diagnosis not present

## 2015-07-24 DIAGNOSIS — M25561 Pain in right knee: Secondary | ICD-10-CM | POA: Diagnosis not present

## 2015-07-24 DIAGNOSIS — M6289 Other specified disorders of muscle: Secondary | ICD-10-CM | POA: Diagnosis not present

## 2015-07-24 DIAGNOSIS — M25562 Pain in left knee: Secondary | ICD-10-CM | POA: Diagnosis not present

## 2015-07-24 DIAGNOSIS — R29898 Other symptoms and signs involving the musculoskeletal system: Secondary | ICD-10-CM

## 2015-07-24 DIAGNOSIS — M6281 Muscle weakness (generalized): Secondary | ICD-10-CM | POA: Diagnosis not present

## 2015-07-24 NOTE — Therapy (Signed)
[facility] [address]  [place], [place], [address] Phone: [phone]   Fax:  [phone]  Physical Therapy Treatment  Patient Details  Name: [name] MRN: [id number] Date of Birth: 1946-05-17 Referring Provider: Jean Rosenthal  Encounter Date: 07/24/2015      PT End of Session - 07/24/15 1428    Visit Number 15   Number of Visits 20   Date for PT Re-Evaluation 08/08/15   PT Start Time 5638   PT Stop Time 1445   PT Time Calculation (min) 42 min   Activity Tolerance Patient tolerated treatment well   Behavior During Therapy [facility] for tasks assessed/performed      Past Medical History  Diagnosis Date  . Arthritis   . Diverticulosis   . GERD (gastroesophageal reflux disease)   . Osteoporosis   . Tuberous sclerosis (HCC)     EXTERNAL LESIONS--  MAINLY HANDS (INTERNAL SCLEROTIC BONY LESIONS LUMBAR & PELVIS PER CT 11/2013)  . Seasonal allergies   . H/O hiatal hernia   . History of diverticulitis of colon   . Nodule of right lung     BENIGN--- AND STABLE PER  CT  11/2013  . History of gastric ulcer   . History of epilepsy     childhood age 76 to 34  ---secondary to high calcium deposts of optic nerve---  none since age 12 with pregency  . IBS (irritable bowel syndrome)   . History of kidney stones   . History of shingles     MARCH 2014--  BACK AREA  . History of vulvar dysplasia     S/P  LASER ABLATION 2014  . Right ureteral stone   . Ureteral stenosis, right   . DDD (degenerative disc disease), lumbosacral   . Renal cyst, left     STABLE PER CT 11/2013  . PONV (postoperative nausea and vomiting)     occurred back in 1970's   . Tachycardia   . Mild obstructive sleep apnea     SLEEP STUDY  11-19-2012--  NO CPAP RX (DID NOT MEET CRITIRIA)  . Shortness of breath dyspnea     walking distances and climbing stairs  . Bronchitis     hx of   . Phlebitis     hx of   . Dysrhythmia   .  Anginal pain ([place])     hx of pt relates to stress last occurrence 8 to 10 years ago; pt states has now been diagnosed with tachycardia last episode of chest pain 1 month ago   . Pneumonia     hx of twice   . Bronchitis     hx of   . Falls     hx of   . Impaired gait and mobility   . Seizures [facility])     Past Surgical History  Procedure Laterality Date  . Bladder surgery  1991    bladder reconstruction of torsion  . Colonoscopy    . Upper gastrointestinal endoscopy    . Cataract extraction w/ intraocular lens  implant, bilateral    . Cholecystectomy  11-18-2003  . Cardiac catheterization  06-26-2001    NORMAL CORONARY ARTERIES  . Orif right bimalleolar ankle fx  12-18-2003  . 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 Lumbar disc surgery  1985  . Vaginal hysterectomy  1975  . Hemorrhoid surgery  1970's  . Co2  laser application N/A 06/09/5100    Procedure: CO2 LASER APPLICATION;  Surgeon: Terrance Mass, MD;  Location: [facility];  Service: Gynecology;  Laterality: N/A;CO2 laser of vaginal and vulvar lesions  . Gynecologic cryosurgery    . Dilation and curettage of uterus  multiple -- last one 1975  . Augmentation mammaplasty      SALINE  . Cystoscopy with retrograde pyelogram, ureteroscopy and stent placement Right 11/22/2013    Procedure: CYSTOSCOPY WITH RETROGRADE PYELOGRAM, URETEROSCOPY AND STENT PLACEMENT;  Surgeon: Sharyn Creamer, MD;  [facility];  Service: Urology;  Laterality: Right;  . Cystoscopy/retrograde/ureteroscopy Right 12/01/2013    Procedure: CYSTOSCOPY, RIGHT RETROGRADE/RIGHT DIGITAL URETEROSCOPY, RIGHT URETERAL STENT EXCHANGE;  Surgeon: Sharyn Creamer, MD;  Location: [facility];  Service: Urology;  Laterality: Right;  STAGED RIGHT URETEROSCOPY RIGHT URETER DILATION    . Holmium laser application Right 10/09/5275    Procedure: HOLMIUM LASER APPLICATION;   Surgeon: Sharyn Creamer, MD;  Location: [facility];  Service: Urology;  Laterality: Right;  . Total hip arthroplasty Left 09/16/2014    Procedure: LEFT TOTAL HIP ARTHROPLASTY ANTERIOR APPROACH;  Surgeon: Mcarthur Rossetti, MD;  Location: [facility];  Service: Orthopedics;  Laterality: Left;  . Hip closed reduction Left 10/16/2014    Procedure: CLOSED MANIPULATION HIP;  Surgeon: Mcarthur Rossetti, MD;  Location: [facility];  Service: Orthopedics;  Laterality: Left;  . Anterior hip revision Left 10/18/2014    Procedure: EXCHANGE LEFT HIP BALL OF DIRECT ANTERIOR TOTAL HIP ARTHROPLASTY;  Surgeon: Mcarthur Rossetti, MD;  Location: [facility];  Service: Orthopedics;  Laterality: Left;  . Total hip arthroplasty Right 02/17/2015    Procedure: RIGHT TOTAL HIP ARTHROPLASTY ANTERIOR APPROACH;  Surgeon: Mcarthur Rossetti, MD;  Location: [facility];  Service: Orthopedics;  Laterality: Right;    There were no vitals filed for this visit.  Visit Diagnosis:  Weakness of both hips  Difficulty walking up stairs  Knee pain, bilateral  Weakness of back      Subjective Assessment - 07/24/15 1411    Subjective Pt. states, she felt a dull ache in the L posterior hip following slipping while getting out of a work [employer].  Pt. states the Lucianne Lei was high up and she slid out of the seat and landed hard on both feet lowering down into a squat at which point she felt pain in the L posterior hip.  Pt. states she did not land on her knees but reported feeling dull ache in L shoulder catching herself during the fall.     Patient Stated Goals be able to get up and down without limit by knee pain   Currently in Pain? No/denies   Pain Score 0-No pain   Multiple Pain Sites No      TODAY'S TREATMENT  TherEx   NuStep 4 level, 40mn Bridge x 10 B SLR 2 x 10 reps  Hook-lying clam shells with blue TB 2 x 15 reps  Strumming L ITB in R Side-Lying x 1 min  B SKTC, HS, piriformis stretch x 30  sec         PT Long Term Goals - 07/11/15 1318    PT LONG TERM GOAL #1   Title pt independent with advanced HEP as necessary by 08/08/15   Status On-going   PT LONG TERM GOAL #2   Title B hip and knee MMT 4+/5 or better grossly by 08/08/15  Met other  than L Hip Flexion, R Hip Extension, R Hip ABD, and R Hip ER   Status Partially Met   PT LONG TERM GOAL #3   Title pt able to safely ambulate up/down stairs with reciprocal gait and single rail use by 08/08/15  achieved in clinic and no issues outside of clinic other than pt reports difficutly with top step of staircase at times   Status Partially Met   PT LONG TERM GOAL #4   Title pt able to perform all transfers (sit->stand and car transfers) safely, easily, with minimal to no need for UE by 08/08/15   Status Partially Met            Plan - 07/24/15 1545    Clinical Impression Statement Today's treatment focused on B hip / knee strengthening in supine following fall friday pt. requested to "go easy" in today's treatment. Pt. tolerated all therex well with no increased in LB or knee pain.     PT Duration --   PT Next Visit Plan L Knee flexion strengthening, B hip stability; Knee strengthening PRN; Continued prone Knee Flexion stretching.  Single leg stance stability training on compliant surfaces.   Consulted and Agree with Plan of Care Patient      Problem List Patient Active Problem List   Diagnosis Date Noted  . Osteoarthritis of right hip 02/17/2015  . 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 Atrial tachycardia ([place]) 12/12/2014  . 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 Recurrent dislocation of hip 10/17/2014  . Dislocation of internal left hip prosthesis ([place]) 10/16/2014  . Ectopic atrial rhythm 10/12/2014  . Atrial bigeminy 10/12/2014  . Osteoarthritis of left hip 09/16/2014  . Status post total replacement of left hip 09/16/2014  . ASCUS favoring benign 09/05/2014  . History of  vitamin B deficiency 05/02/2014  . Acute bronchitis 04/18/2014  . Oral aphthous ulcer 04/18/2014  . Edema 12/21/2013  . Irregular heart beat 12/21/2013  . Anuria 11/12/2013  . History of cervical dysplasia 09/21/2013  . Rectocele, female 09/21/2013  . Kidney stone 09/21/2013  . Rash and nonspecific skin eruption 06/25/2013  . Obesity (BMI 30-39.9) 06/16/2013  . IBS (irritable bowel syndrome) 12/22/2012  . Other sleep disturbances 10/27/2012  . Elevated BP 09/29/2012  . Weight gain 08/17/2012  . Osteopenia 08/17/2012  . Vulvar lesion 08/10/2012  . Tuberous sclerosis ([place]) 07/14/2012  . Condyloma acuminatum in female 07/14/2012  . Shingles 07/14/2012  . Vitamin D deficiency 07/14/2012    Bess Harvest, PTA 07/24/2015, 3:57 PM  [facility] [address]  [place], [place], [address] Phone: [phone]   Fax:  [phone]  Name: [name] MRN: [id number] Date of Birth: February 18, 1946

## 2015-07-25 ENCOUNTER — Ambulatory Visit: Payer: Medicare Other

## 2015-07-25 DIAGNOSIS — M6281 Muscle weakness (generalized): Secondary | ICD-10-CM | POA: Diagnosis not present

## 2015-07-25 DIAGNOSIS — R262 Difficulty in walking, not elsewhere classified: Secondary | ICD-10-CM

## 2015-07-25 DIAGNOSIS — R29898 Other symptoms and signs involving the musculoskeletal system: Secondary | ICD-10-CM

## 2015-07-25 DIAGNOSIS — M25562 Pain in left knee: Secondary | ICD-10-CM

## 2015-07-25 DIAGNOSIS — M25561 Pain in right knee: Secondary | ICD-10-CM | POA: Diagnosis not present

## 2015-07-25 DIAGNOSIS — M6289 Other specified disorders of muscle: Secondary | ICD-10-CM | POA: Diagnosis not present

## 2015-07-25 NOTE — Therapy (Signed)
[facility] [address]  [place], [place], [address] Phone: [phone]   Fax:  [phone]  Physical Therapy Treatment  Patient Details  Name: [name] MRN: [id number] Date of Birth: July 19, 1945 Referring Provider: Jean Rosenthal  Encounter Date: 07/25/2015      PT End of Session - 07/25/15 1453    Visit Number 16   Number of Visits 20   Date for PT Re-Evaluation 08/08/15   PT Start Time 3976   PT Stop Time 1530   PT Time Calculation (min) 41 min   Activity Tolerance Patient tolerated treatment well   Behavior During Therapy [facility] for tasks assessed/performed      Past Medical History  Diagnosis Date  . Arthritis   . Diverticulosis   . GERD (gastroesophageal reflux disease)   . Osteoporosis   . Tuberous sclerosis (HCC)     EXTERNAL LESIONS--  MAINLY HANDS (INTERNAL SCLEROTIC BONY LESIONS LUMBAR & PELVIS PER CT 11/2013)  . Seasonal allergies   . H/O hiatal hernia   . History of diverticulitis of colon   . Nodule of right lung     BENIGN--- AND STABLE PER  CT  11/2013  . History of gastric ulcer   . History of epilepsy     childhood age 70 to 44  ---secondary to high calcium deposts of optic nerve---  none since age 26 with pregency  . IBS (irritable bowel syndrome)   . History of kidney stones   . History of shingles     MARCH 2014--  BACK AREA  . History of vulvar dysplasia     S/P  LASER ABLATION 2014  . Right ureteral stone   . Ureteral stenosis, right   . DDD (degenerative disc disease), lumbosacral   . Renal cyst, left     STABLE PER CT 11/2013  . PONV (postoperative nausea and vomiting)     occurred back in 1970's   . Tachycardia   . Mild obstructive sleep apnea     SLEEP STUDY  11-19-2012--  NO CPAP RX (DID NOT MEET CRITIRIA)  . Shortness of breath dyspnea     walking distances and climbing stairs  . Bronchitis     hx of   . Phlebitis     hx of   . Dysrhythmia   .  Anginal pain ([place])     hx of pt relates to stress last occurrence 8 to 10 years ago; pt states has now been diagnosed with tachycardia last episode of chest pain 1 month ago   . Pneumonia     hx of twice   . Bronchitis     hx of   . Falls     hx of   . Impaired gait and mobility   . Seizures [facility])     Past Surgical History  Procedure Laterality Date  . Bladder surgery  1991    bladder reconstruction of torsion  . Colonoscopy    . Upper gastrointestinal endoscopy    . Cataract extraction w/ intraocular lens  implant, bilateral    . Cholecystectomy  11-18-2003  . Cardiac catheterization  06-26-2001    NORMAL CORONARY ARTERIES  . Orif right bimalleolar ankle fx  12-18-2003  . 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 Lumbar disc surgery  1985  . Vaginal hysterectomy  1975  . Hemorrhoid surgery  1970's  . Co2  laser application N/A 09/01/9377    Procedure: CO2 LASER APPLICATION;  Surgeon: Terrance Mass, MD;  Location: [facility];  Service: Gynecology;  Laterality: N/A;CO2 laser of vaginal and vulvar lesions  . Gynecologic cryosurgery    . Dilation and curettage of uterus  multiple -- last one 1975  . Augmentation mammaplasty      SALINE  . Cystoscopy with retrograde pyelogram, ureteroscopy and stent placement Right 11/22/2013    Procedure: CYSTOSCOPY WITH RETROGRADE PYELOGRAM, URETEROSCOPY AND STENT PLACEMENT;  Surgeon: Sharyn Creamer, MD;  Location: [facility];  Service: Urology;  Laterality: Right;  . Cystoscopy/retrograde/ureteroscopy Right 12/01/2013    Procedure: CYSTOSCOPY, RIGHT RETROGRADE/RIGHT DIGITAL URETEROSCOPY, RIGHT URETERAL STENT EXCHANGE;  Surgeon: Sharyn Creamer, MD;  Location: [facility];  Service: Urology;  Laterality: Right;  STAGED RIGHT URETEROSCOPY RIGHT URETER DILATION    . Holmium laser application Right 0/07/4095    Procedure: HOLMIUM LASER APPLICATION;   Surgeon: Sharyn Creamer, MD;  Location: [facility];  Service: Urology;  Laterality: Right;  . Total hip arthroplasty Left 09/16/2014    Procedure: LEFT TOTAL HIP ARTHROPLASTY ANTERIOR APPROACH;  Surgeon: Mcarthur Rossetti, MD;  Location: [facility];  Service: Orthopedics;  Laterality: Left;  . Hip closed reduction Left 10/16/2014    Procedure: CLOSED MANIPULATION HIP;  Surgeon: Mcarthur Rossetti, MD;  Location: [facility];  Service: Orthopedics;  Laterality: Left;  . Anterior hip revision Left 10/18/2014    Procedure: EXCHANGE LEFT HIP BALL OF DIRECT ANTERIOR TOTAL HIP ARTHROPLASTY;  Surgeon: Mcarthur Rossetti, MD;  Location: [facility];  Service: Orthopedics;  Laterality: Left;  . Total hip arthroplasty Right 02/17/2015    Procedure: RIGHT TOTAL HIP ARTHROPLASTY ANTERIOR APPROACH;  Surgeon: Mcarthur Rossetti, MD;  Location: [facility];  Service: Orthopedics;  Laterality: Right;    There were no vitals filed for this visit.  Visit Diagnosis:  Weakness of both hips  Difficulty walking up stairs  Knee pain, bilateral  Weakness of back      Subjective Assessment - 07/25/15 1454    Subjective Pt. states L lateral hip pain 2/10 at rest 8/10 with L side-lying.  Pt. reports headache as a 8/10 anterior L sided.     Currently in Pain? Yes   Pain Score 2    Pain Location Hip   Pain Orientation Left;Lateral   Pain Descriptors / Indicators Aching   Pain Type Acute pain   Pain Radiating Towards n/a   Pain Frequency Intermittent   Aggravating Factors  up / down stairs   Pain Relieving Factors medication, rest    Multiple Pain Sites Yes   Pain Score 8   Pain Location Head   Pain Orientation Left   Pain Descriptors / Indicators Aching   Pain Radiating Towards n/a   Pain Onset Yesterday   Pain Frequency Constant   Aggravating Factors  movement      Therex  NuStep level 3, 5' Hook-lying bridge 2 sec hold x 10 reps  Bridge with heels on peanut p-ball x 10 reps    Hook-lying clam shells with blue TB x 15  Hook-lying abdominal marching x 10 each leg L SLR hip flexion 2 x 10 reps  Fitter R hip extension x 10 reps  B Standing hip extension / abduction x 10 reps holding onto chair B Standing hip flexion x 10 holding onto chair  Manual R hip flexor stretch in mod thomas x 30 sec R  hip friction massage in mod thomas (for scar tissue breakup) x 3 min        PT Long Term Goals - 07/11/15 1318    PT LONG TERM GOAL #1   Title pt independent with advanced HEP as necessary by 08/08/15   Status On-going   PT LONG TERM GOAL #2   Title B hip and knee MMT 4+/5 or better grossly by 08/08/15  Met other than L Hip Flexion, R Hip Extension, R Hip ABD, and R Hip ER   Status Partially Met   PT LONG TERM GOAL #3   Title pt able to safely ambulate up/down stairs with reciprocal gait and single rail use by 08/08/15  achieved in clinic and no issues outside of clinic other than pt reports difficutly with top step of staircase at times   Status Partially Met   PT LONG TERM GOAL #4   Title pt able to perform all transfers (sit->stand and car transfers) safely, easily, with minimal to no need for UE by 08/08/15   Status Partially Met           Plan - 07/25/15 1453    Clinical Impression Statement Pt. tolerated today's treatment focused on L hip flexion, R hip extenison / abduction strengthening well with only occasional, brief LBP reported.  Pt. continues to improve B hip strength however is frequently limited by brief LB spasms.     PT Next Visit Plan L Knee flexion strengthening, B hip stability; Knee strengthening PRN; Continued prone Knee Flexion stretching.  Single leg stance stability training on compliant surfaces.   Consulted and Agree with Plan of Care Patient        Problem List Patient Active Problem List   Diagnosis Date Noted  . Osteoarthritis of right hip 02/17/2015  . 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 Atrial tachycardia  ([place]) 12/12/2014  . 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 Recurrent dislocation of hip 10/17/2014  . Dislocation of internal left hip prosthesis ([place]) 10/16/2014  . Ectopic atrial rhythm 10/12/2014  . Atrial bigeminy 10/12/2014  . Osteoarthritis of left hip 09/16/2014  . Status post total replacement of left hip 09/16/2014  . ASCUS favoring benign 09/05/2014  . History of vitamin B deficiency 05/02/2014  . Acute bronchitis 04/18/2014  . Oral aphthous ulcer 04/18/2014  . Edema 12/21/2013  . Irregular heart beat 12/21/2013  . Anuria 11/12/2013  . History of cervical dysplasia 09/21/2013  . Rectocele, female 09/21/2013  . Kidney stone 09/21/2013  . Rash and nonspecific skin eruption 06/25/2013  . Obesity (BMI 30-39.9) 06/16/2013  . IBS (irritable bowel syndrome) 12/22/2012  . Other sleep disturbances 10/27/2012  . Elevated BP 09/29/2012  . Weight gain 08/17/2012  . Osteopenia 08/17/2012  . Vulvar lesion 08/10/2012  . Tuberous sclerosis ([place]) 07/14/2012  . Condyloma acuminatum in female 07/14/2012  . Shingles 07/14/2012  . Vitamin D deficiency 07/14/2012    Bess Harvest, PTA 07/25/2015, 3:39 PM  [facility] [address]  [place], [place], [address] Phone: [phone]   Fax:  [phone]  Name: AERIELLE STOKLOSA MRN: [id number] Date of Birth: Jul 03, 1945

## 2015-07-31 ENCOUNTER — Ambulatory Visit: Payer: Medicare Other

## 2015-08-01 ENCOUNTER — Ambulatory Visit: Payer: Medicare Other

## 2015-08-07 ENCOUNTER — Ambulatory Visit: Payer: Medicare Other | Attending: Orthopaedic Surgery | Admitting: Physical Therapy

## 2015-08-07 DIAGNOSIS — M6289 Other specified disorders of muscle: Secondary | ICD-10-CM | POA: Diagnosis not present

## 2015-08-07 DIAGNOSIS — R159 Full incontinence of feces: Secondary | ICD-10-CM | POA: Diagnosis not present

## 2015-08-07 DIAGNOSIS — R262 Difficulty in walking, not elsewhere classified: Secondary | ICD-10-CM | POA: Insufficient documentation

## 2015-08-07 DIAGNOSIS — R29898 Other symptoms and signs involving the musculoskeletal system: Secondary | ICD-10-CM

## 2015-08-07 DIAGNOSIS — N898 Other specified noninflammatory disorders of vagina: Secondary | ICD-10-CM | POA: Diagnosis not present

## 2015-08-07 NOTE — Therapy (Signed)
[facility] [address]  [place], [place], [address] Phone: [phone]   Fax:  [phone]  Physical Therapy Treatment  Patient Details  Name: ALAJA GOLDINGER MRN: [id number] Date of Birth: 10/31/1945 Referring Provider: Jean Rosenthal  Encounter Date: 08/07/2015      PT End of Session - 08/07/15 0805    Visit Number 17   Number of Visits 20   Date for PT Re-Evaluation 08/08/15   PT Start Time 0804   PT Stop Time 0850   PT Time Calculation (min) 46 min      Past Medical History  Diagnosis Date  . Arthritis   . Diverticulosis   . GERD (gastroesophageal reflux disease)   . Osteoporosis   . Tuberous sclerosis (HCC)     EXTERNAL LESIONS--  MAINLY HANDS (INTERNAL SCLEROTIC BONY LESIONS LUMBAR & PELVIS PER CT 11/2013)  . Seasonal allergies   . H/O hiatal hernia   . History of diverticulitis of colon   . Nodule of right lung     BENIGN--- AND STABLE PER  CT  11/2013  . History of gastric ulcer   . History of epilepsy     childhood age 68 to 9  ---secondary to high calcium deposts of optic nerve---  none since age 54 with pregency  . IBS (irritable bowel syndrome)   . History of kidney stones   . History of shingles     MARCH 2014--  BACK AREA  . History of vulvar dysplasia     S/P  LASER ABLATION 2014  . Right ureteral stone   . Ureteral stenosis, right   . DDD (degenerative disc disease), lumbosacral   . Renal cyst, left     STABLE PER CT 11/2013  . PONV (postoperative nausea and vomiting)     occurred back in 1970's   . Tachycardia   . Mild obstructive sleep apnea     SLEEP STUDY  11-19-2012--  NO CPAP RX (DID NOT MEET CRITIRIA)  . Shortness of breath dyspnea     walking distances and climbing stairs  . Bronchitis     hx of   . Phlebitis     hx of   . Dysrhythmia   . Anginal pain ([place])     hx of pt relates to stress last occurrence 8 to 10 years ago; pt states has now been diagnosed  with tachycardia last episode of chest pain 1 month ago   . Pneumonia     hx of twice   . Bronchitis     hx of   . Falls     hx of   . Impaired gait and mobility   . Seizures [facility])     Past Surgical History  Procedure Laterality Date  . Bladder surgery  1991    bladder reconstruction of torsion  . Colonoscopy    . Upper gastrointestinal endoscopy    . Cataract extraction w/ intraocular lens  implant, bilateral    . Cholecystectomy  11-18-2003  . Cardiac catheterization  06-26-2001    NORMAL CORONARY ARTERIES  . Orif right bimalleolar ankle fx  12-18-2003  . 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 Lumbar disc surgery  1985  . Vaginal hysterectomy  1975  . Hemorrhoid surgery  1970's  . Co2 laser application N/A 0/09/8887    Procedure: CO2 LASER APPLICATION;  Surgeon: Terrance Mass, MD;  Location: [facility];  Service: Gynecology;  Laterality: N/A;CO2 laser of vaginal and vulvar lesions  . Gynecologic cryosurgery    . Dilation and curettage of uterus  multiple -- last one 1975  . Augmentation mammaplasty      SALINE  . Cystoscopy with retrograde pyelogram, ureteroscopy and stent placement Right 11/22/2013    Procedure: CYSTOSCOPY WITH RETROGRADE PYELOGRAM, URETEROSCOPY AND STENT PLACEMENT;  Surgeon: Sharyn Creamer, MD;  Location[facility];  Service: Urology;  Laterality: Right;  . Cystoscopy/retrograde/ureteroscopy Right 12/01/2013    Procedure: CYSTOSCOPY, RIGHT RETROGRADE/RIGHT DIGITAL URETEROSCOPY, RIGHT URETERAL STENT EXCHANGE;  Surgeon: Sharyn Creamer, MD;  Location: [facility];  Service: Urology;  Laterality: Right;  STAGED RIGHT URETEROSCOPY RIGHT URETER DILATION    . Holmium laser application Right 12/07/7207    Procedure: HOLMIUM LASER APPLICATION;  Surgeon: Sharyn Creamer, MD;  Location: [facility];  Service: Urology;  Laterality: Right;  . Total  hip arthroplasty Left 09/16/2014    Procedure: LEFT TOTAL HIP ARTHROPLASTY ANTERIOR APPROACH;  Surgeon: Mcarthur Rossetti, MD;  Location: [facility];  Service: Orthopedics;  Laterality: Left;  . Hip closed reduction Left 10/16/2014    Procedure: CLOSED MANIPULATION HIP;  Surgeon: Mcarthur Rossetti, MD;  Location: [facility];  Service: Orthopedics;  Laterality: Left;  . Anterior hip revision Left 10/18/2014    Procedure: EXCHANGE LEFT HIP BALL OF DIRECT ANTERIOR TOTAL HIP ARTHROPLASTY;  Surgeon: Mcarthur Rossetti, MD;  Location: [facility];  Service: Orthopedics;  Laterality: Left;  . Total hip arthroplasty Right 02/17/2015    Procedure: RIGHT TOTAL HIP ARTHROPLASTY ANTERIOR APPROACH;  Surgeon: Mcarthur Rossetti, MD;  Location: [facility];  Service: Orthopedics;  Laterality: Right;    There were no vitals filed for this visit.  Visit Diagnosis:  Weakness of both hips  Difficulty walking up stairs      Subjective Assessment - 08/07/15 0806    Subjective States noted some pain last week while in car approx 2 hours in rain and noted increased pain ithroughout L LE which she states was up to 10/10.  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  States hasn't really had any pain since last Wednesday.  States stairs have improved significantly stating is quite proud of this.  She reports she has been performing stairs with reciprocal gait and rarely has difficulty with top step.   Currently in Pain? No/denies            [facility] PT Assessment - 08/07/15 0001    ROM / Strength   AROM / PROM / Strength Strength   Strength   Strength Assessment Site Knee   Right/Left Hip Right;Left   Right Hip Flexion 4+/5   Right Hip Extension 4+/5   Right Hip External Rotation  4+/5   Right Hip Internal Rotation 5/5   Right Hip ABduction 4+/5   Right Hip ADduction 5/5   Left Hip Flexion 4/5    Left Hip Extension 4+/5   Left Hip External Rotation 4+/5   Left Hip Internal Rotation 5/5   Left Hip ABduction 4+/5   Left Hip ADduction 4+/5   Right/Left Knee Right;Left   Right Knee Flexion 5/5   Right Knee Extension 5/5   Left Knee Flexion 5/5   Left Knee Extension 5/5  TODAY'S TREATMENT TherEx - Bridge 15x3" Bridge with Black TB at Knees 15x3" Stretch B HS, gentle SKTC, Prone Knee Flexion; Supine Mod Thomas Hip Flexor  B LE MMT assessment         PT Education - 08/07/15 [phone]    Education provided Yes   Education Details Stretches added to HEP   Person(s) Educated Patient   Methods Explanation;Handout   Comprehension Verbalized understanding;Returned demonstration             PT Long Term Goals - 08/07/15 1147    PT LONG TERM GOAL #1   Title pt independent with advanced HEP as necessary by 08/08/15   Status Achieved   PT LONG TERM GOAL #2   Title B hip and knee MMT 4+/5 or better grossly by 08/08/15  Met at B Hips and Knee other than L Hip Flexion 4/5.   Status Partially Met   PT LONG TERM GOAL #3   Title pt able to safely ambulate up/down stairs with reciprocal gait and single rail use by 08/08/15   Status Achieved               Plan - 08/07/15 1144    Clinical Impression Statement pt displays significant improvement in B LE MMT today with all planes 4+/5 or better other than L Hip Flexion which is still limited to 4/5.  She states she hasn't noted hip pain in past several days and she feels as though she may be ready to stop PT.  We reviewed some additional stretches for HEP today to address continued hip flexor tightness.  She returns to PT tomorrow and will likely place on hold following that treatment.   PT Next Visit Plan answer any questions; review and progress HEP as necessary   Consulted and Agree with Plan of Care Patient        Problem List Patient Active Problem List   Diagnosis Date Noted  . Osteoarthritis of right hip  02/17/2015  . 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 Atrial tachycardia ([place]) 12/12/2014  . 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 Recurrent dislocation of hip 10/17/2014  . Dislocation of internal left hip prosthesis ([place]) 10/16/2014  . Ectopic atrial rhythm 10/12/2014  . Atrial bigeminy 10/12/2014  . Osteoarthritis of left hip 09/16/2014  . Status post total replacement of left hip 09/16/2014  . ASCUS favoring benign 09/05/2014  . History of vitamin B deficiency 05/02/2014  . Acute bronchitis 04/18/2014  . Oral aphthous ulcer 04/18/2014  . Edema 12/21/2013  . Irregular heart beat 12/21/2013  . Anuria 11/12/2013  . History of cervical dysplasia 09/21/2013  . Rectocele, female 09/21/2013  . Kidney stone 09/21/2013  . Rash and nonspecific skin eruption 06/25/2013  . Obesity (BMI 30-39.9) 06/16/2013  . IBS (irritable bowel syndrome) 12/22/2012  . Other sleep disturbances 10/27/2012  . Elevated BP 09/29/2012  . Weight gain 08/17/2012  . Osteopenia 08/17/2012  . Vulvar lesion 08/10/2012  . Tuberous sclerosis ([place]) 07/14/2012  . Condyloma acuminatum in female 07/14/2012  . Shingles 07/14/2012  . Vitamin D deficiency 07/14/2012    Graeson Nouri PT, OCS 08/07/2015, 11:51 AM  [facility] [address]  [place], [place], [address] Phone: [phone]   Fax:  [phone]  Name: AHMIRA BOISSELLE MRN: [id number] Date of Birth: May 15, 1946

## 2015-08-08 ENCOUNTER — Ambulatory Visit: Payer: Medicare Other | Admitting: Physical Therapy

## 2015-08-08 DIAGNOSIS — M6289 Other specified disorders of muscle: Secondary | ICD-10-CM | POA: Diagnosis not present

## 2015-08-08 DIAGNOSIS — R29898 Other symptoms and signs involving the musculoskeletal system: Secondary | ICD-10-CM

## 2015-08-08 DIAGNOSIS — R262 Difficulty in walking, not elsewhere classified: Secondary | ICD-10-CM

## 2015-08-08 NOTE — Therapy (Addendum)
[facility] [address]  [place], [place], [address] Phone: [phone]   Fax:  [phone]  Physical Therapy Treatment  Patient Details  Name: [name] MRN: [id number] Date of Birth: 09-10-1945 Referring Provider: Jean Rosenthal  Encounter Date: 08/08/2015      PT End of Session - 08/08/15 1025    Visit Number 18   Number of Visits 20   Date for PT Re-Evaluation 08/08/15   PT Start Time 1740   PT Stop Time 1058   PT Time Calculation (min) 37 min      Past Medical History  Diagnosis Date  . Arthritis   . Diverticulosis   . GERD (gastroesophageal reflux disease)   . Osteoporosis   . Tuberous sclerosis (HCC)     EXTERNAL LESIONS--  MAINLY HANDS (INTERNAL SCLEROTIC BONY LESIONS LUMBAR & PELVIS PER CT 11/2013)  . Seasonal allergies   . H/O hiatal hernia   . History of diverticulitis of colon   . Nodule of right lung     BENIGN--- AND STABLE PER  CT  11/2013  . History of gastric ulcer   . History of epilepsy     childhood age 34 to 38  ---secondary to high calcium deposts of optic nerve---  none since age 66 with pregency  . IBS (irritable bowel syndrome)   . History of kidney stones   . History of shingles     MARCH 2014--  BACK AREA  . History of vulvar dysplasia     S/P  LASER ABLATION 2014  . Right ureteral stone   . Ureteral stenosis, right   . DDD (degenerative disc disease), lumbosacral   . Renal cyst, left     STABLE PER CT 11/2013  . PONV (postoperative nausea and vomiting)     occurred back in 1970's   . Tachycardia   . Mild obstructive sleep apnea     SLEEP STUDY  11-19-2012--  NO CPAP RX (DID NOT MEET CRITIRIA)  . Shortness of breath dyspnea     walking distances and climbing stairs  . Bronchitis     hx of   . Phlebitis     hx of   . Dysrhythmia   . Anginal pain ([place])     hx of pt relates to stress last occurrence 8 to 10 years ago; pt states has now been diagnosed  with tachycardia last episode of chest pain 1 month ago   . Pneumonia     hx of twice   . Bronchitis     hx of   . Falls     hx of   . Impaired gait and mobility   . Seizures [facility])     Past Surgical History  Procedure Laterality Date  . Bladder surgery  1991    bladder reconstruction of torsion  . Colonoscopy    . Upper gastrointestinal endoscopy    . Cataract extraction w/ intraocular lens  implant, bilateral    . Cholecystectomy  11-18-2003  . Cardiac catheterization  06-26-2001    NORMAL CORONARY ARTERIES  . Orif right bimalleolar ankle fx  12-18-2003  . 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 Lumbar disc surgery  1985  . Vaginal hysterectomy  1975  . Hemorrhoid surgery  1970's  . Co2 laser application N/A 01/02/4480    Procedure: CO2 LASER APPLICATION;  Surgeon: Terrance Mass, MD;  Location: [facility];  Service: Gynecology;  Laterality: N/A;CO2 laser of vaginal and vulvar lesions  . Gynecologic cryosurgery    . Dilation and curettage of uterus  multiple -- last one 1975  . Augmentation mammaplasty      SALINE  . Cystoscopy with retrograde pyelogram, ureteroscopy and stent placement Right 11/22/2013    Procedure: CYSTOSCOPY WITH RETROGRADE PYELOGRAM, URETEROSCOPY AND STENT PLACEMENT;  Surgeon: Sharyn Creamer, MD;  Location: [facility];  Service: Urology;  Laterality: Right;  . Cystoscopy/retrograde/ureteroscopy Right 12/01/2013    Procedure: CYSTOSCOPY, RIGHT RETROGRADE/RIGHT DIGITAL URETEROSCOPY, RIGHT URETERAL STENT EXCHANGE;  Surgeon: Sharyn Creamer, MD;  Location: [facility];  Service: Urology;  Laterality: Right;  STAGED RIGHT URETEROSCOPY RIGHT URETER DILATION    . Holmium laser application Right 0/07/5850    Procedure: HOLMIUM LASER APPLICATION;  Surgeon: Sharyn Creamer, MD;  Location: [facility];  Service: Urology;  Laterality: Right;  . Total  hip arthroplasty Left 09/16/2014    Procedure: LEFT TOTAL HIP ARTHROPLASTY ANTERIOR APPROACH;  Surgeon: Mcarthur Rossetti, MD;  Location: [facility];  Service: Orthopedics;  Laterality: Left;  . Hip closed reduction Left 10/16/2014    Procedure: CLOSED MANIPULATION HIP;  Surgeon: Mcarthur Rossetti, MD;  Location: [facility];  Service: Orthopedics;  Laterality: Left;  . Anterior hip revision Left 10/18/2014    Procedure: EXCHANGE LEFT HIP BALL OF DIRECT ANTERIOR TOTAL HIP ARTHROPLASTY;  Surgeon: Mcarthur Rossetti, MD;  Location: [facility];  Service: Orthopedics;  Laterality: Left;  . Total hip arthroplasty Right 02/17/2015    Procedure: RIGHT TOTAL HIP ARTHROPLASTY ANTERIOR APPROACH;  Surgeon: Mcarthur Rossetti, MD;  Location: [facility];  Service: Orthopedics;  Laterality: Right;    There were no vitals filed for this visit.  Visit Diagnosis:  Weakness of both hips  Difficulty walking up stairs      Subjective Assessment - 08/08/15 1020    Subjective States noted pain in R T/L area last night beginning around 3AM which she states was 9-10/10 throbbing pain.  States took 2 Aleve and this decreased pain to 1/10 but still TTP.  States walked up/down stairs yesterday with reciprocal gait and states it went very well - "better than I thought it would"   Currently in Pain? Yes   Pain Score 0-No pain   Pain Location Hip   Pain Score 1   Pain Location Back   Pain Orientation Lower;Right  R T/L area          TODAY'S TREATMENT TherEx - SL ing trunk rotation stretch Supine Mod Thomas RF / hip flexor stretch SLS / slight wt bearing on trail foot Lawnmower Blue TB 15x each Hooklying LE March 10x (good ROM) Standing March 10x (good ROM L hip) Reviewed HEP                      PT Education - 08/08/15 1147    Education provided Yes   Education Details HEP update   Person(s) Educated Patient   Methods Explanation;Demonstration;Handout   Comprehension Returned  demonstration;Verbalized understanding             PT Long Term Goals - 08/08/15 1148    PT LONG TERM GOAL #1   Title pt independent with advanced HEP as necessary   Status Achieved   PT LONG TERM GOAL #2   Title B hip and knee MMT 4+/5 or better grossly  Met other than  L Hip Flexion 4/5   Status Partially Met   PT LONG TERM GOAL #3   Title pt able to safely ambulate up/down stairs with reciprocal gait and single rail use   Status Achieved   PT LONG TERM GOAL #4   Title pt able to perform all transfers (sit->stand and car transfers) safely, easily, with minimal to no need for UE  met other than difficulty with car transfers into van/truck at work   Status Partially Met               Plan - 08/08/15 [place] pt without c/o LE pain for over 1 week.  B LE MMT significantly improved since beginning PT and pt denies difficulty with stairs.  She does note intermittent difficulty with car transfers in/out of vans and trucks but otherwise not difficulty with transfers.  Pt pleased with progress and is independent with HEP.  We are placing her on hold today while she performs her HEP.  Should her pain return of if she has additional questions/concerns and feels she needs to return to PT she is to contact us within 30 days.  Otherwise, she will be discharged from our care in 30 days.   PT Next Visit Plan on hold until 09/07/15   Consulted and Agree with Plan of Care Patient     G-Codes    Functional Assessment Tool Used clinicial impression: MMT, gait, transfers   Functional Limitation Mobility: Walking and moving around   Mobility: Walking and Moving Around Current and Discharge Status At least 20 percent but less than 40 percent impaired, limited or restricted   Mobility: Walking and Moving Around Goal Status At least 40 percent but less than 60 percent impaired, limited or restricted            Problem List Patient Active Problem List    Diagnosis Date Noted  . Osteoarthritis of right hip 02/17/2015  . 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 Atrial tachycardia ([place]) 12/12/2014  . 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 Recurrent dislocation of hip 10/17/2014  . Dislocation of internal left hip prosthesis ([place]) 10/16/2014  . Ectopic atrial rhythm 10/12/2014  . Atrial bigeminy 10/12/2014  . Osteoarthritis of left hip 09/16/2014  . Status post total replacement of left hip 09/16/2014  . ASCUS favoring benign 09/05/2014  . History of vitamin B deficiency 05/02/2014  . Acute bronchitis 04/18/2014  . Oral aphthous ulcer 04/18/2014  . Edema 12/21/2013  . Irregular heart beat 12/21/2013  . Anuria 11/12/2013  . History of cervical dysplasia 09/21/2013  . Rectocele, female 09/21/2013  . Kidney stone 09/21/2013  . Rash and nonspecific skin eruption 06/25/2013  . Obesity (BMI 30-39.9) 06/16/2013  . IBS (irritable bowel syndrome) 12/22/2012  . Other sleep disturbances 10/27/2012  . Elevated BP 09/29/2012  . Weight gain 08/17/2012  . Osteopenia 08/17/2012  . Vulvar lesion 08/10/2012  . Tuberous sclerosis ([place]) 07/14/2012  . Condyloma acuminatum in female 07/14/2012  . Shingles 07/14/2012  . Vitamin D deficiency 07/14/2012    Andrus Sharp PT, OCS 08/08/2015, 12:08 PM  [facility] [address]  [place], [place], [address] Phone: [phone]   Fax:  [phone]  Name: [name] MRN: [id number] Date of Birth: [birth year]    PHYSICAL THERAPY DISCHARGE SUMMARY  Visits from Start of Care: 18  Current functional level related to goals / functional  outcomes: Pt last seen on 08/08/15 at which time was performing well without pain and was independent with HEP. She was to contact us should her pain return. We haven't heard from her and are therefore discharging [name] from our care at this time.   Remaining  deficits: Mild weakness with L hip flexion, intermittent difficulty with car transfers   Education / Equipment: HEP, transfer training Plan: Patient agrees to discharge.  Patient goals were partially met. Patient is being discharged due to being pleased with the current functional level.  ?????        Leonette Most PT, OCS 09/27/2015 11:03 AM

## 2015-08-11 DIAGNOSIS — Z0000 Encounter for general adult medical examination without abnormal findings: Secondary | ICD-10-CM | POA: Diagnosis not present

## 2015-08-11 DIAGNOSIS — B9620 Unspecified Escherichia coli [E. coli] as the cause of diseases classified elsewhere: Secondary | ICD-10-CM | POA: Diagnosis not present

## 2015-08-11 DIAGNOSIS — N390 Urinary tract infection, site not specified: Secondary | ICD-10-CM | POA: Diagnosis not present

## 2015-09-18 DIAGNOSIS — J209 Acute bronchitis, unspecified: Secondary | ICD-10-CM | POA: Diagnosis not present

## 2015-09-25 DIAGNOSIS — R05 Cough: Secondary | ICD-10-CM | POA: Diagnosis not present

## 2015-10-09 DIAGNOSIS — M7072 Other bursitis of hip, left hip: Secondary | ICD-10-CM | POA: Diagnosis not present

## 2015-11-06 ENCOUNTER — Encounter: Payer: Self-pay | Admitting: Nurse Practitioner

## 2015-12-21 DIAGNOSIS — R0609 Other forms of dyspnea: Secondary | ICD-10-CM | POA: Diagnosis not present

## 2015-12-21 DIAGNOSIS — K219 Gastro-esophageal reflux disease without esophagitis: Secondary | ICD-10-CM | POA: Diagnosis not present

## 2015-12-21 DIAGNOSIS — R0789 Other chest pain: Secondary | ICD-10-CM | POA: Diagnosis not present

## 2015-12-22 ENCOUNTER — Other Ambulatory Visit: Payer: Self-pay | Admitting: Family Medicine

## 2015-12-22 ENCOUNTER — Inpatient Hospital Stay: Admission: RE | Admit: 2015-12-22 | Payer: Self-pay | Source: Ambulatory Visit

## 2015-12-22 ENCOUNTER — Ambulatory Visit (HOSPITAL_COMMUNITY)
Admission: RE | Admit: 2015-12-22 | Discharge: 2015-12-22 | Disposition: A | Discharge status: Home / Self Care | Payer: Medicare Other | Source: Ambulatory Visit | Attending: Family Medicine | Admitting: Family Medicine

## 2015-12-22 DIAGNOSIS — R079 Chest pain, unspecified: Secondary | ICD-10-CM

## 2015-12-22 DIAGNOSIS — K219 Gastro-esophageal reflux disease without esophagitis: Secondary | ICD-10-CM | POA: Diagnosis not present

## 2015-12-22 DIAGNOSIS — M810 Age-related osteoporosis without current pathological fracture: Secondary | ICD-10-CM | POA: Diagnosis not present

## 2015-12-22 DIAGNOSIS — E669 Obesity, unspecified: Secondary | ICD-10-CM | POA: Diagnosis not present

## 2015-12-22 DIAGNOSIS — R0600 Dyspnea, unspecified: Secondary | ICD-10-CM | POA: Insufficient documentation

## 2015-12-22 DIAGNOSIS — Z6836 Body mass index (BMI) 36.0-36.9, adult: Secondary | ICD-10-CM | POA: Diagnosis not present

## 2015-12-22 DIAGNOSIS — R7989 Other specified abnormal findings of blood chemistry: Secondary | ICD-10-CM

## 2015-12-22 DIAGNOSIS — Z87898 Personal history of other specified conditions: Secondary | ICD-10-CM | POA: Diagnosis not present

## 2015-12-22 DIAGNOSIS — R0602 Shortness of breath: Secondary | ICD-10-CM | POA: Diagnosis not present

## 2015-12-22 DIAGNOSIS — I4891 Unspecified atrial fibrillation: Secondary | ICD-10-CM | POA: Diagnosis not present

## 2015-12-22 DIAGNOSIS — N200 Calculus of kidney: Secondary | ICD-10-CM | POA: Diagnosis not present

## 2015-12-22 MED ORDER — TECHNETIUM TC 99M DIETHYLENETRIAME-PENTAACETIC ACID: 32.4000 | Freq: Once | INTRAVENOUS | Status: DC | PRN

## 2015-12-22 MED ORDER — TECHNETIUM TO 99M ALBUMIN AGGREGATED
4.3000 | Freq: Once | INTRAVENOUS | Status: AC | PRN
Start: 2015-12-22 — End: 2015-12-22
  Administered 2015-12-22: 4.3 via INTRAVENOUS

## 2015-12-28 DIAGNOSIS — M7072 Other bursitis of hip, left hip: Secondary | ICD-10-CM | POA: Diagnosis not present

## 2016-01-18 DIAGNOSIS — M7072 Other bursitis of hip, left hip: Secondary | ICD-10-CM | POA: Diagnosis not present

## 2016-01-19 DIAGNOSIS — M810 Age-related osteoporosis without current pathological fracture: Secondary | ICD-10-CM | POA: Diagnosis not present

## 2016-02-03 ENCOUNTER — Emergency Department (HOSPITAL_BASED_OUTPATIENT_CLINIC_OR_DEPARTMENT_OTHER)
Admission: EM | Admit: 2016-02-03 | Discharge: 2016-02-03 | Disposition: A | Discharge status: Home / Self Care | Payer: Medicare Other | Attending: Emergency Medicine | Admitting: Emergency Medicine

## 2016-02-03 ENCOUNTER — Encounter (HOSPITAL_BASED_OUTPATIENT_CLINIC_OR_DEPARTMENT_OTHER): Payer: Self-pay

## 2016-02-03 ENCOUNTER — Emergency Department (HOSPITAL_BASED_OUTPATIENT_CLINIC_OR_DEPARTMENT_OTHER): Payer: Medicare Other

## 2016-02-03 DIAGNOSIS — M79604 Pain in right leg: Secondary | ICD-10-CM | POA: Diagnosis not present

## 2016-02-03 DIAGNOSIS — M25571 Pain in right ankle and joints of right foot: Secondary | ICD-10-CM | POA: Diagnosis not present

## 2016-02-03 DIAGNOSIS — M25561 Pain in right knee: Secondary | ICD-10-CM | POA: Diagnosis not present

## 2016-02-03 DIAGNOSIS — Z87891 Personal history of nicotine dependence: Secondary | ICD-10-CM | POA: Diagnosis not present

## 2016-02-03 DIAGNOSIS — M545 Low back pain: Secondary | ICD-10-CM | POA: Diagnosis not present

## 2016-02-03 MED ORDER — CYCLOBENZAPRINE HCL 5 MG PO TABS: 10.0000 mg | ORAL_TABLET | Freq: Three times a day (TID) | ORAL | 0 refills | Status: DC | PRN

## 2016-02-03 MED ORDER — NAPROXEN 500 MG PO TABS: 500.0000 mg | ORAL_TABLET | Freq: Two times a day (BID) | ORAL | 0 refills | Status: DC

## 2016-02-03 NOTE — Discharge Instructions (Signed)
Take the medications as needed for pain, follow-up with your orthopedic doctor regarding her back and leg pain, as we discussed please follow-up with your primary care doctor regarding the bone lesions mentioned on your lumbar spine x-rays.

## 2016-02-03 NOTE — ED Notes (Signed)
EDP at [facility] 

## 2016-02-03 NOTE — ED Triage Notes (Signed)
C/o pain to right LE x 4 days-worse today with difficult weight bearing-NAD-presents to triage in w/c

## 2016-02-03 NOTE — ED Notes (Signed)
Pt alert, NAD, calm, interactive, resps e/u, speaking in clear complete sentences, VSS, denies questions or needs, dressed sitting on edge of bed "ready to go", family x2 at [facility].

## 2016-02-03 NOTE — ED Provider Notes (Signed)
[place] DEPT [facility] Provider Note   CSN: [id number] Arrival date & time: 02/03/16  1622 By signing my name below, I, [name], attest that this documentation has been prepared under the direction and in the presence of Dorie Rank, MD . Electronically Signed: Dyke Newton, Scribe. 02/03/2016. 5:31 PM.   History   Chief Complaint Chief Complaint  Patient presents with  . Leg Pain    HPI [name] is a 70 y.o. female who presents to the Emergency Department complaining of sudden onset, moderate right knee, calf, and ankle pain x four days. Pt states pain is worse this morning. Pt works at [employer] and had "airport duty" four days ago when pain began. She states pain is exacerbated by walking or weight bearing. Pt notes associated back pain. Pt has no other complaints at this time. She denies any leg swelling.   The history is provided by the patient. No language interpreter was used.    Past Medical History:  Diagnosis Date  . Arthritis   . DDD (degenerative disc disease), lumbosacral   . Diverticulosis   . Falls    hx of   . GERD (gastroesophageal reflux disease)   . H/O hiatal hernia   . History of diverticulitis of colon   . History of epilepsy    childhood age 20 to 19  ---secondary to high calcium deposts of optic nerve---  none since age 5 with pregency  . History of gastric ulcer   . History of kidney stones   . History of shingles    MARCH 2014--  BACK AREA  . History of vulvar dysplasia    S/P  LASER ABLATION 2014  . IBS (irritable bowel syndrome)   . Mild obstructive sleep apnea    SLEEP STUDY  11-19-2012--  NO CPAP RX (DID NOT MEET CRITIRIA)  . Nodule of right lung    BENIGN--- AND STABLE PER  CT  11/2013  . Osteoporosis   . Renal cyst, left    STABLE PER CT 11/2013  . Seizures ([place])   . 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 Osteoarthritis of right hip 02/17/2015  . 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 Atrial tachycardia ([place]) 12/12/2014  . 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 Recurrent dislocation of hip 10/17/2014  . Dislocation of internal left hip prosthesis ([place]) 10/16/2014  . Ectopic atrial rhythm 10/12/2014  . Atrial bigeminy 10/12/2014  . Osteoarthritis of left hip 09/16/2014  . Status post total replacement of left hip 09/16/2014  . ASCUS favoring benign 09/05/2014  . History of vitamin B deficiency 05/02/2014  . Acute bronchitis 04/18/2014  . Oral aphthous ulcer 04/18/2014  . Edema 12/21/2013  . Irregular heart beat 12/21/2013  . Anuria 11/12/2013  . History of cervical dysplasia 09/21/2013  . Rectocele, female 09/21/2013  . Kidney stone 09/21/2013  . Rash and nonspecific skin eruption 06/25/2013  . Obesity (BMI 30-39.9) 06/16/2013  . IBS (irritable bowel syndrome) 12/22/2012  . Other sleep disturbances 10/27/2012  . Elevated BP 09/29/2012  . Weight gain 08/17/2012  . Osteopenia 08/17/2012  . Vulvar lesion 08/10/2012  . 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 Condyloma acuminatum in female 07/14/2012  . Shingles 07/14/2012  . Vitamin D deficiency 07/14/2012    Past Surgical History:  Procedure Laterality Date  . ANTERIOR HIP REVISION Left 10/18/2014   Procedure: EXCHANGE LEFT HIP BALL OF DIRECT ANTERIOR TOTAL HIP ARTHROPLASTY;  Surgeon: Mcarthur Rossetti, MD;  Location: [place];  Service: Orthopedics;  Laterality: Left;  . AUGMENTATION MAMMAPLASTY     SALINE  . BLADDER SURGERY  1991   bladder reconstruction of torsion  . CARDIAC CATHETERIZATION  06-26-2001   NORMAL CORONARY ARTERIES  . CATARACT EXTRACTION W/ INTRAOCULAR LENS  IMPLANT, BILATERAL    . CHOLECYSTECTOMY  11-18-2003  . CO2 LASER APPLICATION N/A [id number]   Procedure: CO2 LASER APPLICATION;  Surgeon: Terrance Mass, MD;  [facility];  Service: Gynecology;  Laterality: N/A;CO2 laser of vaginal and vulvar lesions  . COLONOSCOPY    . CYSTOSCOPY WITH RETROGRADE PYELOGRAM, URETEROSCOPY AND STENT PLACEMENT Right 11/22/2013   Procedure: CYSTOSCOPY WITH RETROGRADE PYELOGRAM, URETEROSCOPY AND STENT PLACEMENT;  Surgeon: Sharyn Creamer, MD;  Location: [facility];  Service: Urology;  Laterality: Right;  . CYSTOSCOPY/RETROGRADE/URETEROSCOPY Right 12/01/2013   Procedure: CYSTOSCOPY, RIGHT RETROGRADE/RIGHT DIGITAL URETEROSCOPY, RIGHT URETERAL STENT EXCHANGE;  Surgeon: Sharyn Creamer, MD;  Location: [facility];  Service: Urology;  Laterality: Right;  STAGED RIGHT URETEROSCOPY RIGHT URETER DILATION    . DILATION AND CURETTAGE OF UTERUS  multiple -- last one 1975  . GYNECOLOGIC CRYOSURGERY    . HEMORRHOID SURGERY  1970's  . HIP CLOSED REDUCTION Left 10/16/2014   Procedure: CLOSED MANIPULATION HIP;  Surgeon: Mcarthur Rossetti, MD;  [facility];  Service: Orthopedics;  Laterality: Left;  . HOLMIUM LASER APPLICATION Right [id number]   Procedure: HOLMIUM LASER APPLICATION;  Surgeon: Sharyn Creamer, MD;  Location: [facility];  Service: Urology;  Laterality: Right;  . [place] SURGERY  1985  . ORIF RIGHT BIMALLEOLAR ANKLE FX  12-18-2003  . TOTAL HIP ARTHROPLASTY Left 09/16/2014   Procedure: LEFT TOTAL HIP ARTHROPLASTY ANTERIOR APPROACH;  Surgeon: Mcarthur Rossetti, MD;  [facility];  Service: Orthopedics;  Laterality: Left;  . TOTAL HIP ARTHROPLASTY Right 02/17/2015   Procedure: RIGHT TOTAL HIP ARTHROPLASTY ANTERIOR APPROACH;  Surgeon: Mcarthur Rossetti, MD;  [facility];  Service: Orthopedics;  Laterality: Right;  . UPPER GASTROINTESTINAL ENDOSCOPY    . VAGINAL HYSTERECTOMY  1975  . WIDE LOCAL EXCISION OF FOURCHETTE AND PERINEUM  04-06-2009   VULVAR CARCINOMA IN SITU    OB History    Gravida Para Term Preterm AB Living   2 2 0 0 0  2   SAB TAB Ectopic Multiple Live Births   0 0 0 0        Home Medications    Prior to Admission medications   Medication Sig Start Date End Date Taking? Authorizing Provider  acetaminophen (TYLENOL) 500 MG tablet Take 500 mg by mouth every 6 (six) hours as needed for mild pain or headache.    Historical Provider, MD  aspirin EC 325 MG EC tablet Take 1 tablet (325 mg total) by mouth 2 (two) times daily after a meal. Patient taking differently: Take 325 mg by mouth daily.  02/20/15   Mcarthur Rossetti, MD  cholecalciferol (VITAMIN D) 1000 UNITS tablet Take 4,000 Units by mouth every morning.     Historical Provider, MD  cyclobenzaprine (FLEXERIL) 5 MG tablet Take 2 tablets (10 mg total) by mouth 3 (three) times daily as needed for muscle spasms. 02/03/16   Dorie Rank, MD  furosemide (LASIX) 20 MG tablet TAKE 1  TABLET BY MOUTH DAILY Patient taking differently: TAKE 1 TABLET BY MOUTH DAILY IN THE MORNING 07/11/14   Rosalita Chessman Chase, DO  HYDROcodone-acetaminophen (NORCO/VICODIN) 5-325 MG per tablet Take 1-2 tablets by mouth every 4 (four) hours as needed for moderate pain. 02/20/15   Mcarthur Rossetti, MD  KLOR-CON M20 20 MEQ tablet Take 1 tablet by mouth every morning. 12/29/14   Historical Provider, MD  methocarbamol (ROBAXIN) 500 MG tablet Take 1 tablet (500 mg total) by mouth every 6 (six) hours as needed for muscle spasms. 02/20/15   Mcarthur Rossetti, MD  metoprolol succinate (TOPROL XL) 25 MG 24 hr tablet Take 1 tablet (25 mg total) by mouth at bedtime. 06/20/15   Sherran Needs, NP  naproxen (NAPROSYN) 500 MG tablet Take 1 tablet (500 mg total) by mouth 2 (two) times daily. 02/03/16   Dorie Rank, MD  omeprazole (PRILOSEC) 40 MG capsule Take 40 mg by mouth every morning.     Historical Provider, MD  valACYclovir (VALTREX) 1000 MG tablet Take 1 tablet (1,000 mg total) by mouth 3 (three) times daily as needed (for outbreaks). 10/21/14   Mcarthur Rossetti, MD    Family  History Family History  Problem Relation Age of Onset  . Heart disease Mother   . Tuberous sclerosis Mother   . Uterine cancer Mother   . Tuberous sclerosis Maternal Grandmother   . Tuberous sclerosis Sister   . Tuberous sclerosis Daughter   . Tuberous sclerosis Son     2 sons  . Tuberous sclerosis Maternal Aunt   . Tuberous sclerosis Maternal Aunt   . Tuberous sclerosis Maternal Aunt   . Tuberous sclerosis Cousin     Social History Social History  Substance Use Topics  . Smoking status: Former Smoker    Packs/day: 2.00    Years: 25.00    Types: Cigarettes    Quit date: 06/03/1998  . Smokeless tobacco: Never Used  . Alcohol use Yes     Comment: rarely has a glass of wine     Allergies   Other; Zoloft [sertraline hcl]; Percocet [oxycodone-acetaminophen]; and Contrast media [iodinated diagnostic agents]   Review of Systems Review of Systems  Cardiovascular: Negative for leg swelling.  Musculoskeletal: Positive for arthralgias, back pain and gait problem.  All other systems reviewed and are negative.   Physical Exam Updated Vital Signs BP 125/60 (BP Location: Right Arm)   Pulse (!) 56   Temp 98.5 F (36.9 C) (Oral)   Resp 18   Ht 5\' 2"  (1.575 m)   Wt 91.6 kg   SpO2 100%   BMI 36.95 kg/m   Physical Exam  Constitutional: She appears well-developed and well-nourished. No distress.  HENT:  Head: Normocephalic and atraumatic.  Right Ear: External ear normal.  Left Ear: External ear normal.  Eyes: Conjunctivae are normal. Right eye exhibits no discharge. Left eye exhibits no discharge. No scleral icterus.  Neck: Neck supple. No tracheal deviation present.  Cardiovascular: Normal rate.   Pulmonary/Chest: Effort normal. No stridor. No respiratory distress.  Musculoskeletal: She exhibits no edema.       Right hip: Normal.       Right knee: She exhibits no swelling and no effusion. Tenderness found.       Right ankle: She exhibits no swelling and normal pulse.  Tenderness.       Lumbar back: She exhibits no tenderness, no bony tenderness and no swelling.       Right lower leg:  She exhibits no tenderness, no swelling and no edema.  Neurological: She is alert. Cranial nerve deficit: no gross deficits.  Skin: Skin is warm and dry. No rash noted.  Psychiatric: She has a normal mood and affect.  Nursing note and vitals reviewed.    ED Treatments / Results  DIAGNOSTIC STUDIES:  Oxygen Saturation is 100% on RA, normal by my interpretation.    COORDINATION OF CARE:  5:28 PM Will order DG lumbar spine, DG ankle, and DG knee. Discussed treatment plan with pt at bedside and pt agreed to plan.  Labs (all labs ordered are listed, but only abnormal results are displayed) Labs Reviewed - No data to display  EKG  EKG Interpretation None       Radiology Dg Lumbar Spine Complete  Result Date: 02/03/2016 CLINICAL DATA:  Acute low back pain since last night. Unable to bear weight on right leg today. Right ankle pain. Right knee feels unstable. No injury. EXAM: LUMBAR SPINE - COMPLETE 4+ VIEW COMPARISON:  CT abdomen and pelvis 11/12/2013 FINDINGS: Normal alignment of the lumbar spine. Diffuse degenerative changes with narrowed interspaces and endplate hypertrophic changes. Degenerative disc disease at L4-5. No vertebral compression deformities. Heterogeneous nodular sclerosis demonstrated throughout the visualized bones. This pattern has been present previously. The appearance is worrisome for bone metastasis. Bilateral hip arthroplasties. Surgical clips in the right upper quadrant. IMPRESSION: Degenerative changes in the lumbar spine. Normal alignment. No acute displaced fractures identified. Diffuse nodular sclerotic pattern throughout the visualized bones is similar prior study but worrisome for metastatic disease. Electronically Signed   By: Lucienne Capers M.D.   On: 02/03/2016 19:28   Dg Ankle Complete Right  Result Date: 02/03/2016 CLINICAL DATA:   Acute low back pain since last night. Unable to bear weight on right leg. Right ankle pain. No injury. EXAM: RIGHT ANKLE - COMPLETE 3+ VIEW COMPARISON:  Right ankle 12/18/2003.  Right foot 09/26/2012 FINDINGS: Plate and screw fixation of an old healed fracture of the distal right fibula with screw fixation of an old healed fracture of the distal medial malleolus. Focal cystic lesion in the medial aspect of the talar dome with peripheral sclerosis. This could represent a degenerative cyst, avascular necrosis, osteochondral lesion, or other focal bone lesion. No acute fracture or dislocation. Degenerative changes in the intertarsal joints. Old ununited ossicles inferior to the lateral malleolus. IMPRESSION: Postoperative changes with internal fixation of old healed fractures of the medial and lateral malleoli. Cystic lesion in the medial talar dome was present previously and could represent degenerative cyst, avascular necrosis, osteochondral lesion, or other focal bone lesion. No acute fracture or dislocation. Electronically Signed   By: Lucienne Capers M.D.   On: 02/03/2016 19:31   Dg Knee Complete 4 Views Right  Result Date: 02/03/2016 CLINICAL DATA:  Right knee pain and instability on weight-bearing. EXAM: RIGHT KNEE - COMPLETE 4+ VIEW COMPARISON:  None. FINDINGS: Degenerative changes in the right knee with medial, lateral, and patellofemoral compartment narrowing and small osteophyte formation. Chondrocalcinosis. Deformity of the proximal fibula probably represents an old fracture deformity. No acute fracture or dislocation is suspected. No significant effusion. Soft tissues are unremarkable. IMPRESSION: Moderate degenerative changes in the right knee. No acute bony abnormalities. Electronically Signed   By: Lucienne Capers M.D.   On: 02/03/2016 19:32    Procedures Procedures (including critical care time)  Medications Ordered in ED Medications - No data to display   Initial Impression / Assessment  and Plan / ED Course  I have reviewed the triage vital signs and the nursing notes.  Pertinent labs & imaging results that were available during my care of the patient were reviewed by me and considered in my medical decision making (see chart for details).  Clinical Course   No signs of infection or acute vascular compromise. No DVT or cellulitis.   I reviewed the x-ray findings with the patient and her family. No acute fracture noted on her ankle or knee films. She does have findings consistent with arthritis. It's possible this is a contributing factor her symptoms. It is possible the symptoms could also be radicular in nature from a nerve impingement in the lumbar region. I discussed the sclerotic pattern of the bone that was noted on x-ray. Patient didn't have this finding back in 2005. I recommended she follow up with her primary doctor to see if this has been evaluated previously her to consider any further evaluation such as bone scan.  I will prescribe her Naprosyn and Flexeril. Patient will follow up with her primary doctor or orthopedic doctor for further evaluation.  Final Clinical Impressions(s) / ED Diagnoses   Final diagnoses:  Right leg pain   New Prescriptions New Prescriptions   CYCLOBENZAPRINE (FLEXERIL) 5 MG TABLET    Take 2 tablets (10 mg total) by mouth 3 (three) times daily as needed for muscle spasms.   NAPROXEN (NAPROSYN) 500 MG TABLET    Take 1 tablet (500 mg total) by mouth 2 (two) times daily.  I personally performed the services described in this documentation, which was scribed in my presence.  The recorded information has been reviewed and is accurate.      Dorie Rank, MD 02/03/16 2014

## 2016-02-08 ENCOUNTER — Other Ambulatory Visit (HOSPITAL_COMMUNITY): Payer: Self-pay | Admitting: Nurse Practitioner

## 2016-02-08 DIAGNOSIS — M545 Low back pain: Secondary | ICD-10-CM | POA: Diagnosis not present

## 2016-02-10 ENCOUNTER — Other Ambulatory Visit: Payer: Self-pay | Admitting: Physician Assistant

## 2016-02-10 DIAGNOSIS — M545 Low back pain: Secondary | ICD-10-CM

## 2016-02-14 ENCOUNTER — Ambulatory Visit
Admission: RE | Admit: 2016-02-14 | Discharge: 2016-02-14 | Disposition: A | Discharge status: Home / Self Care | Payer: Medicare Other | Source: Ambulatory Visit | Attending: Physician Assistant | Admitting: Physician Assistant

## 2016-02-14 DIAGNOSIS — M5126 Other intervertebral disc displacement, lumbar region: Secondary | ICD-10-CM | POA: Diagnosis not present

## 2016-02-14 DIAGNOSIS — M545 Low back pain: Secondary | ICD-10-CM

## 2016-02-14 MED ORDER — GADOBENATE DIMEGLUMINE 529 MG/ML IV SOLN
19.0000 mL | Freq: Once | INTRAVENOUS | Status: AC | PRN
Start: 2016-02-14 — End: 2016-02-14
  Administered 2016-02-14: 19 mL via INTRAVENOUS

## 2016-02-29 DIAGNOSIS — M25561 Pain in right knee: Secondary | ICD-10-CM | POA: Diagnosis not present

## 2016-02-29 DIAGNOSIS — M545 Low back pain: Secondary | ICD-10-CM | POA: Diagnosis not present

## 2016-03-06 DIAGNOSIS — M79671 Pain in right foot: Secondary | ICD-10-CM | POA: Diagnosis not present

## 2016-03-06 DIAGNOSIS — M6281 Muscle weakness (generalized): Secondary | ICD-10-CM | POA: Diagnosis not present

## 2016-03-06 DIAGNOSIS — M25561 Pain in right knee: Secondary | ICD-10-CM | POA: Diagnosis not present

## 2016-03-06 DIAGNOSIS — M545 Low back pain: Secondary | ICD-10-CM | POA: Diagnosis not present

## 2016-03-11 DIAGNOSIS — I471 Supraventricular tachycardia: Secondary | ICD-10-CM | POA: Diagnosis not present

## 2016-03-11 DIAGNOSIS — E7800 Pure hypercholesterolemia, unspecified: Secondary | ICD-10-CM | POA: Diagnosis not present

## 2016-03-11 DIAGNOSIS — R609 Edema, unspecified: Secondary | ICD-10-CM | POA: Diagnosis not present

## 2016-03-11 DIAGNOSIS — Q851 Tuberous sclerosis: Secondary | ICD-10-CM | POA: Diagnosis not present

## 2016-03-11 DIAGNOSIS — Z1389 Encounter for screening for other disorder: Secondary | ICD-10-CM | POA: Diagnosis not present

## 2016-03-11 DIAGNOSIS — I10 Essential (primary) hypertension: Secondary | ICD-10-CM | POA: Diagnosis not present

## 2016-03-11 DIAGNOSIS — Z1211 Encounter for screening for malignant neoplasm of colon: Secondary | ICD-10-CM | POA: Diagnosis not present

## 2016-03-11 DIAGNOSIS — K219 Gastro-esophageal reflux disease without esophagitis: Secondary | ICD-10-CM | POA: Diagnosis not present

## 2016-03-11 DIAGNOSIS — Z0001 Encounter for general adult medical examination with abnormal findings: Secondary | ICD-10-CM | POA: Diagnosis not present

## 2016-03-11 DIAGNOSIS — H9191 Unspecified hearing loss, right ear: Secondary | ICD-10-CM | POA: Diagnosis not present

## 2016-03-11 DIAGNOSIS — Z23 Encounter for immunization: Secondary | ICD-10-CM | POA: Diagnosis not present

## 2016-03-11 DIAGNOSIS — M810 Age-related osteoporosis without current pathological fracture: Secondary | ICD-10-CM | POA: Diagnosis not present

## 2016-03-13 DIAGNOSIS — M25561 Pain in right knee: Secondary | ICD-10-CM | POA: Diagnosis not present

## 2016-03-13 DIAGNOSIS — M79671 Pain in right foot: Secondary | ICD-10-CM | POA: Diagnosis not present

## 2016-03-13 DIAGNOSIS — M6281 Muscle weakness (generalized): Secondary | ICD-10-CM | POA: Diagnosis not present

## 2016-03-13 DIAGNOSIS — M545 Low back pain: Secondary | ICD-10-CM | POA: Diagnosis not present

## 2016-03-21 ENCOUNTER — Ambulatory Visit (INDEPENDENT_AMBULATORY_CARE_PROVIDER_SITE_OTHER): Payer: Medicare Other | Admitting: Orthopaedic Surgery

## 2016-03-21 DIAGNOSIS — M25561 Pain in right knee: Secondary | ICD-10-CM | POA: Diagnosis not present

## 2016-03-22 ENCOUNTER — Other Ambulatory Visit (INDEPENDENT_AMBULATORY_CARE_PROVIDER_SITE_OTHER): Payer: Self-pay | Admitting: Orthopaedic Surgery

## 2016-03-22 DIAGNOSIS — M25561 Pain in right knee: Secondary | ICD-10-CM

## 2016-03-28 DIAGNOSIS — Z1211 Encounter for screening for malignant neoplasm of colon: Secondary | ICD-10-CM | POA: Diagnosis not present

## 2016-04-03 ENCOUNTER — Ambulatory Visit
Admission: RE | Admit: 2016-04-03 | Discharge: 2016-04-03 | Disposition: A | Discharge status: Home / Self Care | Payer: Medicare Other | Source: Ambulatory Visit | Attending: Orthopaedic Surgery | Admitting: Orthopaedic Surgery

## 2016-04-03 DIAGNOSIS — S83281A Other tear of lateral meniscus, current injury, right knee, initial encounter: Secondary | ICD-10-CM | POA: Diagnosis not present

## 2016-04-03 DIAGNOSIS — M25561 Pain in right knee: Secondary | ICD-10-CM

## 2016-04-11 DIAGNOSIS — H903 Sensorineural hearing loss, bilateral: Secondary | ICD-10-CM | POA: Diagnosis not present

## 2016-04-11 DIAGNOSIS — H8143 Vertigo of central origin, bilateral: Secondary | ICD-10-CM | POA: Diagnosis not present

## 2016-04-18 ENCOUNTER — Ambulatory Visit (INDEPENDENT_AMBULATORY_CARE_PROVIDER_SITE_OTHER): Payer: Medicare Other | Admitting: Orthopaedic Surgery

## 2016-04-18 DIAGNOSIS — M1711 Unilateral primary osteoarthritis, right knee: Secondary | ICD-10-CM | POA: Diagnosis not present

## 2016-04-18 DIAGNOSIS — G8929 Other chronic pain: Secondary | ICD-10-CM | POA: Diagnosis not present

## 2016-04-18 DIAGNOSIS — M25561 Pain in right knee: Secondary | ICD-10-CM

## 2016-04-18 MED ORDER — LIDOCAINE HCL 1 % IJ SOLN
3.0000 mL | INTRAMUSCULAR | Status: AC | PRN
  Administered 2016-04-18: 3 mL

## 2016-04-18 MED ORDER — METHYLPREDNISOLONE ACETATE 40 MG/ML IJ SUSP
40.0000 mg | INTRAMUSCULAR | Status: AC | PRN
  Administered 2016-04-18: 40 mg via INTRA_ARTICULAR

## 2016-04-18 NOTE — Progress Notes (Signed)
Office Visit Note   Patient: [name]           Date of Birth: December 01, 1945           MRN: [id number] Visit Date: 04/18/2016              Requested by: Carol Ada, MD [address] [place], [place] [address] PCP: Reginia Naas, MD   Assessment & Plan: Visit Diagnoses:  1. Chronic pain of right knee   2. Unilateral primary osteoarthritis, right knee     Plan: She tolerated the steroid injection in her right knee well. We will order hyaluronic acid injection for her right knee at this standpoint given the moderate arthritis in her knee that showed on MRI. We'll see her back in 2 weeks and we'll place had injection in her knee. She is been work on weight loss as well as quad strengthening exercises.  Follow-Up Instructions: Return in about 4 weeks (around 05/16/2016).   Orders:  No orders of the defined types were placed in this encounter.  No orders of the defined types were placed in this encounter.     Procedures: Large Joint Inj Date/Time: 04/18/2016 2:06 PM Performed by: Mcarthur Rossetti Authorized by: Mcarthur Rossetti   Location:  Knee Site:  R knee Approach:  Anterolateral Ultrasound Guidance: No   Fluoroscopic Guidance: No   Arthrogram: No   Medications:  3 mL lidocaine 1 %; 40 mg methylPREDNISolone acetate 40 MG/ML     Clinical Data: No additional findings.   Subjective: Chief Complaint  Patient presents with  . Right Knee - Follow-up    Discuss MRI results    HPI  Review of Systems   Objective: Vital Signs: There were no vitals taken for this visit.  Physical Exam She is alert and oriented 3. Ortho Exam Her right knee continues to show good range of motion with a slight valgus deformity. There is medial lateral joint line tenderness but no McMurray sign and a negative Lachman sign. The knee feels ligament was a stable. Specialty Comments:  No specialty comments available.  Imaging: No results  found. The MRI of her right knee shows moderate arthritic changes throughout the knee with no significant effusion. There is minimal degenerative meniscal signals medially and laterally but nothing that would warrant an acute surgical intervention. Again her significant chondromalacia throughout the knee.  PMFS History: Patient Active Problem List   Diagnosis Date Noted  . Osteoarthritis of right hip 02/17/2015  . 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 Atrial tachycardia ([place]) 12/12/2014  . 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 Recurrent dislocation of hip 10/17/2014  . Dislocation of internal left hip prosthesis ([place]) 10/16/2014  . Ectopic atrial rhythm 10/12/2014  . Atrial bigeminy 10/12/2014  . Osteoarthritis of left hip 09/16/2014  . Status post total replacement of left hip 09/16/2014  . ASCUS favoring benign 09/05/2014  . History of vitamin B deficiency 05/02/2014  . Acute bronchitis 04/18/2014  . Oral aphthous ulcer 04/18/2014  . Edema 12/21/2013  . Irregular heart beat 12/21/2013  . Anuria 11/12/2013  . History of cervical dysplasia 09/21/2013  . Rectocele, female 09/21/2013  . Kidney stone 09/21/2013  . Rash and nonspecific skin eruption 06/25/2013  . Obesity (BMI 30-39.9) 06/16/2013  . IBS (irritable bowel syndrome) 12/22/2012  . Other sleep disturbances 10/27/2012  . Elevated BP 09/29/2012  . Weight gain 08/17/2012  . Osteopenia 08/17/2012  .  Vulvar lesion 08/10/2012  . 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 Condyloma acuminatum in female 07/14/2012  . Shingles 07/14/2012  . Vitamin D deficiency 07/14/2012   Past Medical History:  Diagnosis Date  . Arthritis   . DDD (degenerative disc disease), lumbosacral   . Diverticulosis   . Falls    hx of   . GERD (gastroesophageal reflux disease)   . H/O hiatal hernia   . History of diverticulitis of colon   . History of epilepsy    childhood age 38 to 59  ---secondary to  high calcium deposts of optic nerve---  none since age 68 with pregency  . History of gastric ulcer   . History of kidney stones   . History of shingles    MARCH 2014--  BACK AREA  . History of vulvar dysplasia    S/P  LASER ABLATION 2014  . IBS (irritable bowel syndrome)   . Mild obstructive sleep apnea    SLEEP STUDY  11-19-2012--  NO CPAP RX (DID NOT MEET CRITIRIA)  . Nodule of right lung    BENIGN--- AND STABLE PER  CT  11/2013  . Osteoporosis   . Renal cyst, left    STABLE PER CT 11/2013  . Seizures ([place])   . 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 Heart disease Mother   . Tuberous sclerosis Mother   . Uterine cancer Mother   . Tuberous sclerosis Maternal Grandmother   . Tuberous sclerosis Sister   . Tuberous sclerosis Daughter   . Tuberous sclerosis Son     2 sons  . Tuberous sclerosis Maternal Aunt   . Tuberous sclerosis Maternal Aunt   . Tuberous sclerosis Maternal Aunt   . Tuberous sclerosis Cousin     Past Surgical History:  Procedure Laterality Date  . ANTERIOR HIP REVISION Left 10/18/2014   Procedure: EXCHANGE LEFT HIP BALL OF DIRECT ANTERIOR TOTAL HIP ARTHROPLASTY;  Surgeon: Mcarthur Rossetti, MD;  Location: [place];  Service: Orthopedics;  Laterality: Left;  . AUGMENTATION MAMMAPLASTY     SALINE  . BLADDER SURGERY  1991   bladder reconstruction of torsion  . CARDIAC CATHETERIZATION  06-26-2001   NORMAL CORONARY ARTERIES  . CATARACT EXTRACTION W/ INTRAOCULAR LENS  IMPLANT, BILATERAL    . CHOLECYSTECTOMY  11-18-2003  . CO2 LASER APPLICATION N/A [id number]   Procedure: CO2 LASER APPLICATION;  Surgeon: Terrance Mass, MD;  Location: [facility];  Service: Gynecology;  Laterality: N/A;CO2 laser of vaginal and vulvar lesions  . COLONOSCOPY    . CYSTOSCOPY WITH RETROGRADE PYELOGRAM, URETEROSCOPY AND STENT  PLACEMENT Right 11/22/2013   Procedure: CYSTOSCOPY WITH RETROGRADE PYELOGRAM, URETEROSCOPY AND STENT PLACEMENT;  Surgeon: Sharyn Creamer, MD;  Location: [facility];  Service: Urology;  Laterality: Right;  . CYSTOSCOPY/RETROGRADE/URETEROSCOPY Right 12/01/2013   Procedure: CYSTOSCOPY, RIGHT RETROGRADE/RIGHT DIGITAL URETEROSCOPY, RIGHT URETERAL STENT EXCHANGE;  Surgeon: Sharyn Creamer, MD;  Location: [facility];  Service: Urology;  Laterality: Right;  STAGED RIGHT URETEROSCOPY RIGHT URETER DILATION    . DILATION AND CURETTAGE OF UTERUS  multiple -- last one 1975  . GYNECOLOGIC CRYOSURGERY    . HEMORRHOID SURGERY  1970's  . HIP CLOSED REDUCTION Left 10/16/2014   Procedure: CLOSED MANIPULATION HIP;  Surgeon: Mcarthur Rossetti, MD;  [facility];  Service: Orthopedics;  Laterality: Left;  . HOLMIUM LASER APPLICATION Right [id number]   Procedure: HOLMIUM LASER APPLICATION;  Surgeon: Sharyn Creamer, MD;  Location[facility];  Service: Urology;  Laterality: Right;  . [place] SURGERY  1985  . ORIF RIGHT BIMALLEOLAR ANKLE FX  12-18-2003  . TOTAL HIP ARTHROPLASTY Left 09/16/2014   Procedure: LEFT TOTAL HIP ARTHROPLASTY ANTERIOR APPROACH;  Surgeon: Mcarthur Rossetti, MD;  [facility];  Service: Orthopedics;  Laterality: Left;  . TOTAL HIP ARTHROPLASTY Right 02/17/2015   Procedure: RIGHT TOTAL HIP ARTHROPLASTY ANTERIOR APPROACH;  Surgeon: Mcarthur Rossetti, MD;  [facility];  Service: Orthopedics;  Laterality: Right;  . UPPER GASTROINTESTINAL ENDOSCOPY    . VAGINAL HYSTERECTOMY  1975  . WIDE LOCAL EXCISION OF FOURCHETTE AND PERINEUM  04-06-2009   VULVAR CARCINOMA IN SITU   Social History   Occupational History  . driver Other    [employer]   Social History Main Topics  . Smoking status: Former Smoker    Packs/day: 2.00    Years: 25.00    Types: Cigarettes    Quit date: 06/03/1998  . Smokeless tobacco: Never  Used  . Alcohol use Yes     Comment: rarely has a glass of wine  . Drug use: No  . Sexual activity: Not Currently    Partners: Male

## 2016-04-29 DIAGNOSIS — J019 Acute sinusitis, unspecified: Secondary | ICD-10-CM | POA: Diagnosis not present

## 2016-05-02 ENCOUNTER — Encounter (INDEPENDENT_AMBULATORY_CARE_PROVIDER_SITE_OTHER): Payer: Self-pay | Admitting: Physician Assistant

## 2016-05-02 ENCOUNTER — Ambulatory Visit (INDEPENDENT_AMBULATORY_CARE_PROVIDER_SITE_OTHER): Payer: Medicare Other | Admitting: Physician Assistant

## 2016-05-02 ENCOUNTER — Ambulatory Visit (INDEPENDENT_AMBULATORY_CARE_PROVIDER_SITE_OTHER): Payer: Medicare Other

## 2016-05-02 VITALS — Ht 62.0 in | Wt 202.0 lb

## 2016-05-02 DIAGNOSIS — M25511 Pain in right shoulder: Secondary | ICD-10-CM

## 2016-05-02 DIAGNOSIS — M1711 Unilateral primary osteoarthritis, right knee: Secondary | ICD-10-CM

## 2016-05-02 DIAGNOSIS — G8929 Other chronic pain: Secondary | ICD-10-CM

## 2016-05-02 MED ORDER — HYALURONAN 88 MG/4ML IX SOSY
88.0000 mg | PREFILLED_SYRINGE | INTRA_ARTICULAR | Status: AC | PRN
  Administered 2016-05-02: 88 mg via INTRA_ARTICULAR

## 2016-05-02 MED ORDER — LIDOCAINE HCL 1 % IJ SOLN
3.0000 mL | INTRAMUSCULAR | Status: AC | PRN
  Administered 2016-05-02: 3 mL

## 2016-05-02 MED ORDER — METHYLPREDNISOLONE ACETATE 40 MG/ML IJ SUSP
40.0000 mg | INTRAMUSCULAR | Status: AC | PRN
  Administered 2016-05-02: 40 mg via INTRA_ARTICULAR

## 2016-05-02 NOTE — Progress Notes (Signed)
Office Visit Note   Patient: [name]           Date of Birth: 1945/08/04           MRN: [id number] Visit Date: 05/02/2016              Requested by: Carol Ada, MD [address], [place] [address] PCP: Reginia Naas, MD   Assessment & Plan: Visit Diagnoses:  1. Chronic right shoulder pain   2. Primary osteoarthritis of right knee     Plan: Shoulder exercises demonstrated patient will have her do these wall crawls, pendulum and Codman exercises. Right knee quad strengthening is encouraged.  Follow-Up Instructions: Return in about 4 weeks (around 05/30/2016).   Orders:  Orders Placed This Encounter  Procedures  . Large Joint Injection/Arthrocentesis  . Large Joint Injection/Arthrocentesis  . XR Shoulder Right   No orders of the defined types were placed in this encounter.     Procedures: Large Joint Inj Date/Time: 05/02/2016 5:02 PM Performed by: Pete Pelt Authorized by: Pete Pelt   Consent Given by:  Patient Site marked: the procedure site was marked   Indications:  Pain Location:  Knee Site:  R knee Needle Size:  22 G Approach:  Anterolateral Ultrasound Guidance: No   Fluoroscopic Guidance: No   Medications:  88 mg Hyaluronan 88 MG/4ML Aspiration Attempted: No   Patient tolerance:  Patient tolerated the procedure well with no immediate complications Large Joint Inj Date/Time: 05/02/2016 5:03 PM Performed by: Pete Pelt Authorized by: Pete Pelt   Consent Given by:  Patient Site marked: the procedure site was marked   Indications:  Pain Location:  Shoulder Site:  R subacromial bursa Needle Size:  22 G Needle Length:  1.5 inches Approach:  Lateral Ultrasound Guidance: No   Fluoroscopic Guidance: No   Arthrogram: No   Medications:  3 mL lidocaine 1 %; 40 mg methylPREDNISolone acetate 40 MG/ML Aspiration Attempted: No   Patient tolerance:  Patient  tolerated the procedure well with no immediate complications     Clinical Data: No additional findings.   Subjective: Chief Complaint  Patient presents with  . Right Shoulder - Pain  . Right Knee - Pain    monovisc injection    Patient presents for right knee follow up and right monovisc injection. She is status post right cortisone injection 04/18/16. She did have good relief but is ready to pursue hyaluronic acid injection today. Patient also presents with right shoulder pain, worsening over the weeks. She is unable to sleep on right side. She is requesting injection today for right shoulder.    Review of Systems   Objective: Vital Signs: Ht 5\' 2"  (1.575 m)   Wt 202 lb (91.6 kg)   BMI 36.95 kg/m   Physical Exam  Constitutional: She is oriented to person, place, and time. She appears well-developed and well-nourished. No distress.  Neurological: She is alert and oriented to person, place, and time.  Psychiatric: She has a normal mood and affect.   radial pulses are 2+ bilaterally  Ortho Exam Right knee she has tenderness along the medial lateral joint line. Full extension flexion to 110. No effusion no erythema or signs of infection. 5 out of 5 strengths both shoulders with external and internal rotation against resistance. Negative liftoff test on the left unable to perform liftoff  test on the right secondary to decreased motion. Positive impingement on the right. Negative cross over test bilaterally. Empty can test negative bilaterally. Specialty Comments:  No specialty comments available.  Imaging: Xr Shoulder Right  Result Date: 05/02/2016 3 views right shoulder: No acute fracture. Moderate degenerative changes of the AC joint. Glenohumeral joint appears well preserved. Y view is of no diagnostic quality    PMFS History: Patient Active Problem List   Diagnosis Date Noted  . Osteoarthritis of right hip 02/17/2015  . Status post total replacement of right hip  02/17/2015  . Atrial tachycardia ([place]) 12/12/2014  . 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 Recurrent dislocation of hip 10/17/2014  . Dislocation of internal left hip prosthesis ([place]) 10/16/2014  . Ectopic atrial rhythm 10/12/2014  . Atrial bigeminy 10/12/2014  . Osteoarthritis of left hip 09/16/2014  . Status post total replacement of left hip 09/16/2014  . ASCUS favoring benign 09/05/2014  . History of vitamin B deficiency 05/02/2014  . Acute bronchitis 04/18/2014  . Oral aphthous ulcer 04/18/2014  . Edema 12/21/2013  . Irregular heart beat 12/21/2013  . Anuria 11/12/2013  . History of cervical dysplasia 09/21/2013  . Rectocele, female 09/21/2013  . Kidney stone 09/21/2013  . Rash and nonspecific skin eruption 06/25/2013  . Obesity (BMI 30-39.9) 06/16/2013  . IBS (irritable bowel syndrome) 12/22/2012  . Other sleep disturbances 10/27/2012  . Elevated BP 09/29/2012  . Weight gain 08/17/2012  . Osteopenia 08/17/2012  . Vulvar lesion 08/10/2012  . 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 Condyloma acuminatum in female 07/14/2012  . Shingles 07/14/2012  . Vitamin D deficiency 07/14/2012   Past Medical History:  Diagnosis Date  . Arthritis   . DDD (degenerative disc disease), lumbosacral   . Diverticulosis   . Falls    hx of   . GERD (gastroesophageal reflux disease)   . H/O hiatal hernia   . History of diverticulitis of colon   . History of epilepsy    childhood age 76 to 26  ---secondary to high calcium deposts of optic nerve---  none since age 57 with pregency  . History of gastric ulcer   . History of kidney stones   . History of shingles    MARCH 2014--  BACK AREA  . History of vulvar dysplasia    S/P  LASER ABLATION 2014  . IBS (irritable bowel syndrome)   . Mild obstructive sleep apnea    SLEEP STUDY  11-19-2012--  NO CPAP RX (DID NOT MEET CRITIRIA)  . Nodule of right lung    BENIGN--- AND STABLE PER  CT  11/2013  . Osteoporosis   .  Renal cyst, left    STABLE PER CT 11/2013  . Seizures ([place])   . 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 Heart disease Mother   . Tuberous sclerosis Mother   . Uterine cancer Mother   . Tuberous sclerosis Maternal Grandmother   . Tuberous sclerosis Sister   . Tuberous sclerosis Daughter   . Tuberous sclerosis Son     2 sons  . Tuberous sclerosis Maternal Aunt   . Tuberous sclerosis Maternal Aunt   . Tuberous sclerosis Maternal Aunt   . Tuberous sclerosis Cousin     Past Surgical History:  Procedure Laterality Date  . ANTERIOR HIP REVISION Left 10/18/2014  Procedure: EXCHANGE LEFT HIP BALL OF DIRECT ANTERIOR TOTAL HIP ARTHROPLASTY;  Surgeon: Mcarthur Rossetti, MD;  Location: [facility];  Service: Orthopedics;  Laterality: Left;  . AUGMENTATION MAMMAPLASTY     SALINE  . BLADDER SURGERY  1991   bladder reconstruction of torsion  . CARDIAC CATHETERIZATION  06-26-2001   NORMAL CORONARY ARTERIES  . CATARACT EXTRACTION W/ INTRAOCULAR LENS  IMPLANT, BILATERAL    . CHOLECYSTECTOMY  11-18-2003  . CO2 LASER APPLICATION N/A [id number]   Procedure: CO2 LASER APPLICATION;  Surgeon: Terrance Mass, MD;  Location: [facility];  Service: Gynecology;  Laterality: N/A;CO2 laser of vaginal and vulvar lesions  . COLONOSCOPY    . CYSTOSCOPY WITH RETROGRADE PYELOGRAM, URETEROSCOPY AND STENT PLACEMENT Right 11/22/2013   Procedure: CYSTOSCOPY WITH RETROGRADE PYELOGRAM, URETEROSCOPY AND STENT PLACEMENT;  Surgeon: Sharyn Creamer, MD;  Location[facility];  Service: Urology;  Laterality: Right;  . CYSTOSCOPY/RETROGRADE/URETEROSCOPY Right 12/01/2013   Procedure: CYSTOSCOPY, RIGHT RETROGRADE/RIGHT DIGITAL URETEROSCOPY, RIGHT URETERAL STENT EXCHANGE;  Surgeon: Sharyn Creamer, MD;  Location: [facility];  Service: Urology;   Laterality: Right;  STAGED RIGHT URETEROSCOPY RIGHT URETER DILATION    . DILATION AND CURETTAGE OF UTERUS  multiple -- last one 1975  . GYNECOLOGIC CRYOSURGERY    . HEMORRHOID SURGERY  1970's  . HIP CLOSED REDUCTION Left 10/16/2014   Procedure: CLOSED MANIPULATION HIP;  Surgeon: Mcarthur Rossetti, MD;  Location: [facility];  Service: Orthopedics;  Laterality: Left;  . HOLMIUM LASER APPLICATION Right [id number]   Procedure: HOLMIUM LASER APPLICATION;  Surgeon: Sharyn Creamer, MD;  Location: [facility];  Service: Urology;  Laterality: Right;  . [place] SURGERY  1985  . ORIF RIGHT BIMALLEOLAR ANKLE FX  12-18-2003  . TOTAL HIP ARTHROPLASTY Left 09/16/2014   Procedure: LEFT TOTAL HIP ARTHROPLASTY ANTERIOR APPROACH;  Surgeon: Mcarthur Rossetti, MD;  Location: [facility];  Service: Orthopedics;  Laterality: Left;  . TOTAL HIP ARTHROPLASTY Right 02/17/2015   Procedure: RIGHT TOTAL HIP ARTHROPLASTY ANTERIOR APPROACH;  Surgeon: Mcarthur Rossetti, MD;  Location: [facility];  Service: Orthopedics;  Laterality: Right;  . UPPER GASTROINTESTINAL ENDOSCOPY    . VAGINAL HYSTERECTOMY  1975  . WIDE LOCAL EXCISION OF FOURCHETTE AND PERINEUM  04-06-2009   VULVAR CARCINOMA IN SITU   Social History   Occupational History  . driver Other    [employer]   Social History Main Topics  . Smoking status: Former Smoker    Packs/day: 2.00    Years: 25.00    Types: Cigarettes    Quit date: 06/03/1998  . Smokeless tobacco: Never Used  . Alcohol use Yes     Comment: rarely has a glass of wine  . Drug use: No  . Sexual activity: Not Currently    Partners: Male

## 2016-05-21 DIAGNOSIS — J069 Acute upper respiratory infection, unspecified: Secondary | ICD-10-CM | POA: Diagnosis not present

## 2016-06-04 DIAGNOSIS — R399 Unspecified symptoms and signs involving the genitourinary system: Secondary | ICD-10-CM | POA: Diagnosis not present

## 2016-06-04 DIAGNOSIS — L609 Nail disorder, unspecified: Secondary | ICD-10-CM | POA: Diagnosis not present

## 2016-06-04 DIAGNOSIS — N3000 Acute cystitis without hematuria: Secondary | ICD-10-CM | POA: Diagnosis not present

## 2016-06-04 DIAGNOSIS — K219 Gastro-esophageal reflux disease without esophagitis: Secondary | ICD-10-CM | POA: Diagnosis not present

## 2016-06-06 ENCOUNTER — Ambulatory Visit (INDEPENDENT_AMBULATORY_CARE_PROVIDER_SITE_OTHER): Payer: Self-pay | Admitting: Physician Assistant

## 2016-06-13 ENCOUNTER — Ambulatory Visit (INDEPENDENT_AMBULATORY_CARE_PROVIDER_SITE_OTHER): Payer: Medicare Other | Admitting: Physician Assistant

## 2016-06-13 ENCOUNTER — Ambulatory Visit (INDEPENDENT_AMBULATORY_CARE_PROVIDER_SITE_OTHER): Payer: Medicare Other

## 2016-06-13 ENCOUNTER — Encounter (INDEPENDENT_AMBULATORY_CARE_PROVIDER_SITE_OTHER): Payer: Self-pay

## 2016-06-13 ENCOUNTER — Encounter (INDEPENDENT_AMBULATORY_CARE_PROVIDER_SITE_OTHER): Payer: Self-pay | Admitting: Physician Assistant

## 2016-06-13 DIAGNOSIS — L602 Onychogryphosis: Secondary | ICD-10-CM | POA: Diagnosis not present

## 2016-06-13 DIAGNOSIS — G8929 Other chronic pain: Secondary | ICD-10-CM

## 2016-06-13 DIAGNOSIS — M7541 Impingement syndrome of right shoulder: Secondary | ICD-10-CM

## 2016-06-13 DIAGNOSIS — M25562 Pain in left knee: Secondary | ICD-10-CM

## 2016-06-13 NOTE — Progress Notes (Signed)
Office Visit Note   Patient: [name]           Date of Birth: 04-03-1946           MRN: [id number] Visit Date: 06/13/2016              Requested by: Carol Ada, MD [address] [place], [place] [address] PCP: Reginia Naas, MD   Assessment & Plan: Visit Diagnoses:  1. Chronic pain of left knee   2. Onychogryphosis   3. Shoulder impingement syndrome, right     Plan: MRI of her right shoulder rule out rotator cuff tear. If she requires surgery on her right shoulder while set this up E done at the same time as the excision and ablation of both thumbnails. See her back in about 2 weeks to go over the results of the MRI. In regards to the left knee will try to gain approval for supplemental injection in this knee as is been very beneficial for her right knee.  Follow-Up Instructions: Return in about 3 weeks (around 07/04/2016) for Supplemental injection.   Orders:  Orders Placed This Encounter  Procedures  . Large Joint Injection/Arthrocentesis  . XR Knee 1-2 Views Left   No orders of the defined types were placed in this encounter.     Procedures: No procedures performed   Clinical Data: No additional findings.   Subjective: Chief Complaint  Patient presents with  . Right Shoulder - Follow-up  . Right Knee - Follow-up  . RT and LT Thumb fingernail    HPI Vin[place] returns today stating that her right knee pain is much improved status post Monovisc injection on 05/02/2016. She is requesting injection in the left knee with Monovisc also as she's having difficulty going up and down stairs and pain globally throughout the knee. Regards to her right shoulder the injection really did not give her any results area has pain with extreme external rotation and overhead activity in the shoulder she denies any radicular symptoms down the arm. Bilateral thumbnail deformities secondary to her Tuberous sclerosis.  Review of Systems   Objective: Vital  Signs: There were no vitals taken for this visit.  Physical Exam Developed well-nourished female in no acute distress. Affect appropriate. Psychiatric alert 3 Ortho Exam Bilateral hands she has deformities of the thumbnails bilaterally and these appeared to be RAM horn like consistent with onychogryphosis due to Tuberous sclerosis. Maintaining fingernails are all painted and no gross deformities. Final shoulders 5 out of 5 strengths with external and internal rotation against resistance. Empty can test negative bilaterally but causes significant pain on the right. Positive impingement testing on the right. Left knee she has tenderness along medial lateral joint line. She has good range of motion of the knee no instability slight valgus deformity. Specialty Comments:  No specialty comments available.  Imaging: Xr Knee 1-2 Views Left  Result Date: 06/13/2016 AP lateral views left knee: Ossification of the meniscus of both knees seen. Otherwise medial lateral joint lines of both knees well preserved on the AP view. Lateral view of the left knee shows that the patellofemoral joint be well maintained. No acute fractures no bony abnormalities.    PMFS History: Patient Active Problem List   Diagnosis Date Noted  . Osteoarthritis of right hip 02/17/2015  . 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 Atrial tachycardia ([place]) 12/12/2014  . 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 Recurrent dislocation of  hip 10/17/2014  . Dislocation of internal left hip prosthesis ([place]) 10/16/2014  . Ectopic atrial rhythm 10/12/2014  . Atrial bigeminy 10/12/2014  . Osteoarthritis of left hip 09/16/2014  . Status post total replacement of left hip 09/16/2014  . ASCUS favoring benign 09/05/2014  . History of vitamin B deficiency 05/02/2014  . Acute bronchitis 04/18/2014  . Oral aphthous ulcer 04/18/2014  . Edema 12/21/2013  . Irregular heart beat 12/21/2013  . Anuria  11/12/2013  . History of cervical dysplasia 09/21/2013  . Rectocele, female 09/21/2013  . Kidney stone 09/21/2013  . Rash and nonspecific skin eruption 06/25/2013  . Obesity (BMI 30-39.9) 06/16/2013  . IBS (irritable bowel syndrome) 12/22/2012  . Other sleep disturbances 10/27/2012  . Elevated BP 09/29/2012  . Weight gain 08/17/2012  . Osteopenia 08/17/2012  . Vulvar lesion 08/10/2012  . 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 Condyloma acuminatum in female 07/14/2012  . Shingles 07/14/2012  . Vitamin D deficiency 07/14/2012   Past Medical History:  Diagnosis Date  . Arthritis   . DDD (degenerative disc disease), lumbosacral   . Diverticulosis   . Falls    hx of   . GERD (gastroesophageal reflux disease)   . H/O hiatal hernia   . History of diverticulitis of colon   . History of epilepsy    childhood age 41 to 12  ---secondary to high calcium deposts of optic nerve---  none since age 56 with pregency  . History of gastric ulcer   . History of kidney stones   . History of shingles    MARCH 2014--  BACK AREA  . History of vulvar dysplasia    S/P  LASER ABLATION 2014  . IBS (irritable bowel syndrome)   . Mild obstructive sleep apnea    SLEEP STUDY  11-19-2012--  NO CPAP RX (DID NOT MEET CRITIRIA)  . Nodule of right lung    BENIGN--- AND STABLE PER  CT  11/2013  . Osteoporosis   . Renal cyst, left    STABLE PER CT 11/2013  . Seizures ([place])   . 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 Heart disease Mother   . Tuberous sclerosis Mother   . Uterine cancer Mother   . Tuberous sclerosis Maternal Grandmother   . Tuberous sclerosis Sister   . Tuberous sclerosis Daughter   . Tuberous sclerosis Son     2 sons  . Tuberous sclerosis Maternal Aunt   . Tuberous sclerosis Maternal Aunt   . Tuberous sclerosis Maternal Aunt   . Tuberous  sclerosis Cousin     Past Surgical History:  Procedure Laterality Date  . ANTERIOR HIP REVISION Left 10/18/2014   Procedure: EXCHANGE LEFT HIP BALL OF DIRECT ANTERIOR TOTAL HIP ARTHROPLASTY;  Surgeon: Mcarthur Rossetti, MD;  Location: [facility];  Service: Orthopedics;  Laterality: Left;  . AUGMENTATION MAMMAPLASTY     SALINE  . BLADDER SURGERY  1991   bladder reconstruction of torsion  . CARDIAC CATHETERIZATION  06-26-2001   NORMAL CORONARY ARTERIES  . CATARACT EXTRACTION W/ INTRAOCULAR LENS  IMPLANT, BILATERAL    . CHOLECYSTECTOMY  11-18-2003  . CO2 LASER APPLICATION N/A [id number]   Procedure: CO2 LASER APPLICATION;  Surgeon: Terrance Mass, MD;  Location: [facility];  Service: Gynecology;  Laterality: N/A;CO2 laser of vaginal and vulvar lesions  .  COLONOSCOPY    . CYSTOSCOPY WITH RETROGRADE PYELOGRAM, URETEROSCOPY AND STENT PLACEMENT Right 11/22/2013   Procedure: CYSTOSCOPY WITH RETROGRADE PYELOGRAM, URETEROSCOPY AND STENT PLACEMENT;  Surgeon: Sharyn Creamer, MD;  Location: [facility];  Service: Urology;  Laterality: Right;  . CYSTOSCOPY/RETROGRADE/URETEROSCOPY Right 12/01/2013   Procedure: CYSTOSCOPY, RIGHT RETROGRADE/RIGHT DIGITAL URETEROSCOPY, RIGHT URETERAL STENT EXCHANGE;  Surgeon: Sharyn Creamer, MD;  Location: [facility];  Service: Urology;  Laterality: Right;  STAGED RIGHT URETEROSCOPY RIGHT URETER DILATION    . DILATION AND CURETTAGE OF UTERUS  multiple -- last one 1975  . GYNECOLOGIC CRYOSURGERY    . HEMORRHOID SURGERY  1970's  . HIP CLOSED REDUCTION Left 10/16/2014   Procedure: CLOSED MANIPULATION HIP;  Surgeon: Mcarthur Rossetti, MD;  Location: [facility];  Service: Orthopedics;  Laterality: Left;  . HOLMIUM LASER APPLICATION Right [id number]   Procedure: HOLMIUM LASER APPLICATION;  Surgeon: Sharyn Creamer, MD;  Location: [facility];  Service: Urology;  Laterality: Right;  . [place] SURGERY  1985  .  ORIF RIGHT BIMALLEOLAR ANKLE FX  12-18-2003  . TOTAL HIP ARTHROPLASTY Left 09/16/2014   Procedure: LEFT TOTAL HIP ARTHROPLASTY ANTERIOR APPROACH;  Surgeon: Mcarthur Rossetti, MD;  Location: [facility];  Service: Orthopedics;  Laterality: Left;  . TOTAL HIP ARTHROPLASTY Right 02/17/2015   Procedure: RIGHT TOTAL HIP ARTHROPLASTY ANTERIOR APPROACH;  Surgeon: Mcarthur Rossetti, MD;  Location: [facility];  Service: Orthopedics;  Laterality: Right;  . UPPER GASTROINTESTINAL ENDOSCOPY    . VAGINAL HYSTERECTOMY  1975  . WIDE LOCAL EXCISION OF FOURCHETTE AND PERINEUM  04-06-2009   VULVAR CARCINOMA IN SITU   Social History   Occupational History  . driver Other    [employer]   Social History Main Topics  . Smoking status: Former Smoker    Packs/day: 2.00    Years: 25.00    Types: Cigarettes    Quit date: 06/03/1998  . Smokeless tobacco: Never Used  . Alcohol use Yes     Comment: rarely has a glass of wine  . Drug use: No  . Sexual activity: Not Currently    Partners: Male

## 2016-06-17 ENCOUNTER — Other Ambulatory Visit (INDEPENDENT_AMBULATORY_CARE_PROVIDER_SITE_OTHER): Payer: Self-pay

## 2016-06-17 DIAGNOSIS — M25511 Pain in right shoulder: Principal | ICD-10-CM

## 2016-06-17 DIAGNOSIS — G8929 Other chronic pain: Secondary | ICD-10-CM

## 2016-06-26 ENCOUNTER — Telehealth (INDEPENDENT_AMBULATORY_CARE_PROVIDER_SITE_OTHER): Payer: Self-pay | Admitting: *Deleted

## 2016-06-26 ENCOUNTER — Ambulatory Visit
Admission: RE | Admit: 2016-06-26 | Discharge: 2016-06-26 | Disposition: A | Discharge status: Home / Self Care | Payer: Medicare Other | Source: Ambulatory Visit | Attending: Orthopaedic Surgery | Admitting: Orthopaedic Surgery

## 2016-06-26 DIAGNOSIS — M25511 Pain in right shoulder: Principal | ICD-10-CM

## 2016-06-26 DIAGNOSIS — G8929 Other chronic pain: Secondary | ICD-10-CM

## 2016-06-26 NOTE — Telephone Encounter (Signed)
Patient came in this afternoon in regards to needing to talk to [place] about her pain. Her CB is [phone].

## 2016-06-27 NOTE — Telephone Encounter (Signed)
Patient states her leg is having some pain and she has an appt next week. I told her she is more than welcome to check back for cancellations. I told her if the pain got unmanageable over the weekend to go to the ER

## 2016-07-04 ENCOUNTER — Ambulatory Visit (INDEPENDENT_AMBULATORY_CARE_PROVIDER_SITE_OTHER): Payer: Medicare Other | Admitting: Physician Assistant

## 2016-07-04 DIAGNOSIS — M1711 Unilateral primary osteoarthritis, right knee: Secondary | ICD-10-CM

## 2016-07-04 DIAGNOSIS — M25511 Pain in right shoulder: Secondary | ICD-10-CM

## 2016-07-04 MED ORDER — HYALURONAN 88 MG/4ML IX SOSY
88.0000 mg | PREFILLED_SYRINGE | INTRA_ARTICULAR | Status: AC | PRN
  Administered 2016-07-04: 88 mg via INTRA_ARTICULAR

## 2016-07-04 NOTE — Progress Notes (Signed)
Office Visit Note   Patient: [name]           Date of Birth: 09-18-1945           MRN: [id number] Visit Date: 07/04/2016              Requested by: Carol Ada, MD [place], [place] [address] PCP: Reginia Naas, MD   Assessment & Plan: Visit Diagnoses:  1. Primary osteoarthritis of right knee   2. Right shoulder pain, unspecified chronicity     Plan: See her back in 6 weeks' check the progress of the right knee with theMonovisc injection. In regards to her shoulder she does not wish to have any surgery of her shoulder at this point in time I didn't discuss with her that if she starts developing weakness or decreased range of motion persistent pain I would come in she proceed with again right shoulder subacromial beak compression arthroscopic debridement and possible rotator cuff repair.  Follow-Up Instructions: Return in about 6 weeks (around 08/15/2016).   Orders:  Orders Placed This Encounter  Procedures  . Large Joint Injection/Arthrocentesis   No orders of the defined types were placed in this encounter.     Procedures: Large Joint Inj Date/Time: 07/04/2016 1:09 PM Performed by: Anise Salvo Authorized by: Pete Pelt   Consent Given by:  Patient Indications:  Joint swelling and pain Location:  Knee Site:  R knee Needle Size:  22 G Needle Length:  1.5 inches Ultrasound Guidance: No   Fluoroscopic Guidance: No   Medications:  88 mg Hyaluronan 88 MG/4ML       Clinical Data: No additional findings.   Subjective: No chief complaint on file.   HPI This Klepper returns today follow-up of her right shoulder status post MRI. She is also here for right knee Monovisc injection. MRI of the right shoulder is reviewed with the patient today. MRI showed severe subscapularis tendon pathway with considerable partial tearing no full-thickness tear. Also smaller tears which were also partial thickness in the  supraspinatus and infraspinatus tendons.  Degenerative tearing of the superior and posterior labrum noted. Type.-2 acromion. Review of Systems   Objective: Vital Signs: There were no vitals taken for this visit.  Physical Exam  Ortho Exam Right knee overall good range of motion and no effusion no abnormal warmth no erythema and no instability Specialty Comments:  No specialty comments available.  Imaging: No results found.   PMFS History: Patient Active Problem List   Diagnosis Date Noted  . Osteoarthritis of right hip 02/17/2015  . 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 Atrial tachycardia ([place]) 12/12/2014  . 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 Recurrent dislocation of hip 10/17/2014  . Dislocation of internal left hip prosthesis ([place]) 10/16/2014  . Ectopic atrial rhythm 10/12/2014  . Atrial bigeminy 10/12/2014  . Osteoarthritis of left hip 09/16/2014  . Status post total replacement of left hip 09/16/2014  . ASCUS favoring benign 09/05/2014  . History of vitamin B deficiency 05/02/2014  . Acute bronchitis 04/18/2014  . Oral aphthous ulcer 04/18/2014  . Edema 12/21/2013  . Irregular heart beat 12/21/2013  . Anuria 11/12/2013  . History of cervical dysplasia 09/21/2013  . Rectocele, female 09/21/2013  . Kidney stone 09/21/2013  . Rash and nonspecific skin eruption 06/25/2013  . Obesity (BMI 30-39.9) 06/16/2013  . IBS (irritable bowel syndrome) 12/22/2012  . Other sleep disturbances 10/27/2012  .  Elevated BP 09/29/2012  . Weight gain 08/17/2012  . Osteopenia 08/17/2012  . Vulvar lesion 08/10/2012  . 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 Condyloma acuminatum in female 07/14/2012  . Shingles 07/14/2012  . Vitamin D deficiency 07/14/2012   Past Medical History:  Diagnosis Date  . Arthritis   . DDD (degenerative disc disease), lumbosacral   . Diverticulosis   . Falls    hx of   . GERD (gastroesophageal reflux  disease)   . H/O hiatal hernia   . History of diverticulitis of colon   . History of epilepsy    childhood age 79 to 3  ---secondary to high calcium deposts of optic nerve---  none since age 87 with pregency  . History of gastric ulcer   . History of kidney stones   . History of shingles    MARCH 2014--  BACK AREA  . History of vulvar dysplasia    S/P  LASER ABLATION 2014  . IBS (irritable bowel syndrome)   . Mild obstructive sleep apnea    SLEEP STUDY  11-19-2012--  NO CPAP RX (DID NOT MEET CRITIRIA)  . Nodule of right lung    BENIGN--- AND STABLE PER  CT  11/2013  . Osteoporosis   . Renal cyst, left    STABLE PER CT 11/2013  . Seizures ([place])   . 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 Heart disease Mother   . Tuberous sclerosis Mother   . Uterine cancer Mother   . Tuberous sclerosis Maternal Grandmother   . Tuberous sclerosis Sister   . Tuberous sclerosis Daughter   . Tuberous sclerosis Son     2 sons  . Tuberous sclerosis Maternal Aunt   . Tuberous sclerosis Maternal Aunt   . Tuberous sclerosis Maternal Aunt   . Tuberous sclerosis Cousin     Past Surgical History:  Procedure Laterality Date  . ANTERIOR HIP REVISION Left 10/18/2014   Procedure: EXCHANGE LEFT HIP BALL OF DIRECT ANTERIOR TOTAL HIP ARTHROPLASTY;  Surgeon: Mcarthur Rossetti, MD;  Location: [facility];  Service: Orthopedics;  Laterality: Left;  . AUGMENTATION MAMMAPLASTY     SALINE  . BLADDER SURGERY  1991   bladder reconstruction of torsion  . CARDIAC CATHETERIZATION  06-26-2001   NORMAL CORONARY ARTERIES  . CATARACT EXTRACTION W/ INTRAOCULAR LENS  IMPLANT, BILATERAL    . CHOLECYSTECTOMY  11-18-2003  . CO2 LASER APPLICATION N/A [id number]   Procedure: CO2 LASER APPLICATION;  Surgeon: Terrance Mass, MD;  Location[facility];  Service: Gynecology;   Laterality: N/A;CO2 laser of vaginal and vulvar lesions  . COLONOSCOPY    . CYSTOSCOPY WITH RETROGRADE PYELOGRAM, URETEROSCOPY AND STENT PLACEMENT Right 11/22/2013   Procedure: CYSTOSCOPY WITH RETROGRADE PYELOGRAM, URETEROSCOPY AND STENT PLACEMENT;  Surgeon: Sharyn Creamer, MD;  Location: [facility];  Service: Urology;  Laterality: Right;  . CYSTOSCOPY/RETROGRADE/URETEROSCOPY Right 12/01/2013   Procedure: CYSTOSCOPY, RIGHT RETROGRADE/RIGHT DIGITAL URETEROSCOPY, RIGHT URETERAL STENT EXCHANGE;  Surgeon: Sharyn Creamer, MD;  Location: [facility];  Service: Urology;  Laterality: Right;  STAGED RIGHT URETEROSCOPY RIGHT URETER DILATION    . DILATION AND CURETTAGE OF UTERUS  multiple -- last one 1975  . GYNECOLOGIC CRYOSURGERY    . HEMORRHOID SURGERY  1970's  . HIP CLOSED REDUCTION Left 10/16/2014   Procedure: CLOSED MANIPULATION  HIP;  Surgeon: Mcarthur Rossetti, MD;  Location: [facility];  Service: Orthopedics;  Laterality: Left;  . HOLMIUM LASER APPLICATION Right [id number]   Procedure: HOLMIUM LASER APPLICATION;  Surgeon: Sharyn Creamer, MD;  Location: [facility];  Service: Urology;  Laterality: Right;  . [place] SURGERY  1985  . ORIF RIGHT BIMALLEOLAR ANKLE FX  12-18-2003  . TOTAL HIP ARTHROPLASTY Left 09/16/2014   Procedure: LEFT TOTAL HIP ARTHROPLASTY ANTERIOR APPROACH;  Surgeon: Mcarthur Rossetti, MD;  Location: [facility];  Service: Orthopedics;  Laterality: Left;  . TOTAL HIP ARTHROPLASTY Right 02/17/2015   Procedure: RIGHT TOTAL HIP ARTHROPLASTY ANTERIOR APPROACH;  Surgeon: Mcarthur Rossetti, MD;  Location: [facility];  Service: Orthopedics;  Laterality: Right;  . UPPER GASTROINTESTINAL ENDOSCOPY    . VAGINAL HYSTERECTOMY  1975  . WIDE LOCAL EXCISION OF FOURCHETTE AND PERINEUM  04-06-2009   VULVAR CARCINOMA IN SITU   Social History   Occupational History  . driver Other    [employer]   Social History Main Topics  .  Smoking status: Former Smoker    Packs/day: 2.00    Years: 25.00    Types: Cigarettes    Quit date: 06/03/1998  . Smokeless tobacco: Never Used  . Alcohol use Yes     Comment: rarely has a glass of wine  . Drug use: No  . Sexual activity: Not Currently    Partners: Male

## 2016-07-25 DIAGNOSIS — M810 Age-related osteoporosis without current pathological fracture: Secondary | ICD-10-CM | POA: Diagnosis not present

## 2016-08-01 ENCOUNTER — Ambulatory Visit (INDEPENDENT_AMBULATORY_CARE_PROVIDER_SITE_OTHER): Payer: Medicare Other | Admitting: Physician Assistant

## 2016-08-01 DIAGNOSIS — M5442 Lumbago with sciatica, left side: Secondary | ICD-10-CM | POA: Diagnosis not present

## 2016-08-01 DIAGNOSIS — M7541 Impingement syndrome of right shoulder: Secondary | ICD-10-CM

## 2016-08-01 NOTE — Progress Notes (Signed)
Office Visit Note   Patient: [name]           Date of Birth: 11/21/1945           MRN: [id number] Visit Date: 08/01/2016              Requested by: Carol Ada, MD [place], [place] [address] PCP: Reginia Naas, MD   Assessment & Plan: Visit Diagnoses:  1. Impingement syndrome of right shoulder   2. Acute left-sided low back pain with left-sided sciatica     Plan: Due to the fact that she's failed conservative treatment including cortisone injection time exercise would recommend right shoulder arthroscopy with debridement and subacromial decompression and possible rotator cuff tear. Discussed with her the postop recovery from this a shoulder arthroscopy with debridement and SAD versus rotator cuff repair. She would like to proceed with surgery in the near future. Regards to her back we'll send her for an epidural steroid injection lumbar spine. We'll see her back 1 week postop.  Follow-Up Instructions: Return in about 1 week (around 08/08/2016) for after surgery, post op.   Orders:  No orders of the defined types were placed in this encounter.  No orders of the defined types were placed in this encounter.     Procedures: No procedures performed   Clinical Data: No additional findings.   Subjective: Chief Complaint  Patient presents with  . Right Shoulder - Follow-up  . Left Knee - Follow-up    Patient here following up her right shoulder and left knee. She states that the Monovisc injection she had at the beginning at February hasn't worked at all. He noted that it was in fact her left knee that was given on of his injection 4 weeks ago and not her right knee cyst was a dictation error .She is still having a lot of pain in her left  knee, aswell as her shoulder, she wants to discuss shoulder surgery today too. She's currently having pain down the left leg from buttocks down into her ankle and foot region. She denies any numbness  tingling down the leg. September she had some back pain and radicular symptoms down the right leg was sent for an MRI. With conservative treatment this resolved. Had no new injury. She did undergo an MRI of the lumbar spine in September which showed mild left foraminal stenosis and foraminal disc protrusion at L3-4, L4-L5 bilateral foraminal stenosis secondary to disc bulge and L5-S1 mild left foraminal stenosis secondary to facet arthropathy.  MR I of her right shoulder dated 06/27/2016 again showed moderate to severe subscapularis tendinopathy with considerable partial thickness tearing and small partial tears in the supraspinatus and infraspinatus tendons. Downsloping type II acromion.  Review of Systems Right shoulder pain Radicular pain down the left leg. Denies numbness tingling down the left leg. Systems otherwise negative  Objective: Vital Signs: There were no vitals taken for this visit.  Physical Exam  Constitutional: She is oriented to person, place, and time. She appears well-developed and well-nourished. No distress.  Neurological: She is alert and oriented to person, place, and time.  Psychiatric: She has a normal mood and affect. Her behavior is normal.    Ortho Exam Lower extremity strength testing 5 out of 5 strength throughout the lower extremities against resistance. Her straight leg raise on the left. Right shoulder positive impingement testing. 5 out of 5 strengths with external and internal rotation against resistance bilaterally. Empty can test  negative bilaterally. Specialty Comments:  No specialty comments available.  Imaging: No results found.   PMFS History: Patient Active Problem List   Diagnosis Date Noted  . Osteoarthritis of right hip 02/17/2015  . 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 Atrial tachycardia ([place]) 12/12/2014  . 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 Recurrent dislocation of hip 10/17/2014  .  Dislocation of internal left hip prosthesis ([place]) 10/16/2014  . Ectopic atrial rhythm 10/12/2014  . Atrial bigeminy 10/12/2014  . Osteoarthritis of left hip 09/16/2014  . Status post total replacement of left hip 09/16/2014  . ASCUS favoring benign 09/05/2014  . History of vitamin B deficiency 05/02/2014  . Acute bronchitis 04/18/2014  . Oral aphthous ulcer 04/18/2014  . Edema 12/21/2013  . Irregular heart beat 12/21/2013  . Anuria 11/12/2013  . History of cervical dysplasia 09/21/2013  . Rectocele, female 09/21/2013  . Kidney stone 09/21/2013  . Rash and nonspecific skin eruption 06/25/2013  . Obesity (BMI 30-39.9) 06/16/2013  . IBS (irritable bowel syndrome) 12/22/2012  . Other sleep disturbances 10/27/2012  . Elevated BP 09/29/2012  . Weight gain 08/17/2012  . Osteopenia 08/17/2012  . Vulvar lesion 08/10/2012  . 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 Condyloma acuminatum in female 07/14/2012  . Shingles 07/14/2012  . Vitamin D deficiency 07/14/2012   Past Medical History:  Diagnosis Date  . Arthritis   . DDD (degenerative disc disease), lumbosacral   . Diverticulosis   . Falls    hx of   . GERD (gastroesophageal reflux disease)   . H/O hiatal hernia   . History of diverticulitis of colon   . History of epilepsy    childhood age 5 to 74  ---secondary to high calcium deposts of optic nerve---  none since age 60 with pregency  . History of gastric ulcer   . History of kidney stones   . History of shingles    MARCH 2014--  BACK AREA  . History of vulvar dysplasia    S/P  LASER ABLATION 2014  . IBS (irritable bowel syndrome)   . Mild obstructive sleep apnea    SLEEP STUDY  11-19-2012--  NO CPAP RX (DID NOT MEET CRITIRIA)  . Nodule of right lung    BENIGN--- AND STABLE PER  CT  11/2013  . Osteoporosis   . Renal cyst, left    STABLE PER CT 11/2013  . Seizures ([place])   . Tuberous sclerosis ([facility])    EXTERNAL LESIONS--  MAINLY HANDS (INTERNAL SCLEROTIC BONY LESIONS  LUMBAR & PELVIS PER CT 11/2013)    Family History  Problem Relation Age of Onset  . Heart disease Mother   . Tuberous sclerosis Mother   . Uterine cancer Mother   . Tuberous sclerosis Maternal Grandmother   . Tuberous sclerosis Sister   . Tuberous sclerosis Daughter   . Tuberous sclerosis Son     2 sons  . Tuberous sclerosis Maternal Aunt   . Tuberous sclerosis Maternal Aunt   . Tuberous sclerosis Maternal Aunt   . Tuberous sclerosis Cousin     Past Surgical History:  Procedure Laterality Date  . ANTERIOR HIP REVISION Left 10/18/2014   Procedure: EXCHANGE LEFT HIP BALL OF DIRECT ANTERIOR TOTAL HIP ARTHROPLASTY;  Surgeon: Mcarthur Rossetti, MD;  Location: [facility];  Service: Orthopedics;  Laterality: Left;  . AUGMENTATION MAMMAPLASTY     SALINE  . BLADDER SURGERY  1991   bladder reconstruction of torsion  .  CARDIAC CATHETERIZATION  06-26-2001   NORMAL CORONARY ARTERIES  . CATARACT EXTRACTION W/ INTRAOCULAR LENS  IMPLANT, BILATERAL    . CHOLECYSTECTOMY  11-18-2003  . CO2 LASER APPLICATION N/A [id number]   Procedure: CO2 LASER APPLICATION;  Surgeon: Terrance Mass, MD;  Location: [facility];  Service: Gynecology;  Laterality: N/A;CO2 laser of vaginal and vulvar lesions  . COLONOSCOPY    . CYSTOSCOPY WITH RETROGRADE PYELOGRAM, URETEROSCOPY AND STENT PLACEMENT Right 11/22/2013   Procedure: CYSTOSCOPY WITH RETROGRADE PYELOGRAM, URETEROSCOPY AND STENT PLACEMENT;  Surgeon: Sharyn Creamer, MD;  Location: [facility];  Service: Urology;  Laterality: Right;  . CYSTOSCOPY/RETROGRADE/URETEROSCOPY Right 12/01/2013   Procedure: CYSTOSCOPY, RIGHT RETROGRADE/RIGHT DIGITAL URETEROSCOPY, RIGHT URETERAL STENT EXCHANGE;  Surgeon: Sharyn Creamer, MD;  Location: [facility];  Service: Urology;  Laterality: Right;  STAGED RIGHT URETEROSCOPY RIGHT URETER DILATION    . DILATION AND CURETTAGE OF UTERUS  multiple -- last one 1975  . GYNECOLOGIC  CRYOSURGERY    . HEMORRHOID SURGERY  1970's  . HIP CLOSED REDUCTION Left 10/16/2014   Procedure: CLOSED MANIPULATION HIP;  Surgeon: Mcarthur Rossetti, MD;  Location: [facility];  Service: Orthopedics;  Laterality: Left;  . HOLMIUM LASER APPLICATION Right [id number]   Procedure: HOLMIUM LASER APPLICATION;  Surgeon: Sharyn Creamer, MD;  Location: [facility];  Service: Urology;  Laterality: Right;  . [place] SURGERY  1985  . ORIF RIGHT BIMALLEOLAR ANKLE FX  12-18-2003  . TOTAL HIP ARTHROPLASTY Left 09/16/2014   Procedure: LEFT TOTAL HIP ARTHROPLASTY ANTERIOR APPROACH;  Surgeon: Mcarthur Rossetti, MD;  Location: [facility];  Service: Orthopedics;  Laterality: Left;  . TOTAL HIP ARTHROPLASTY Right 02/17/2015   Procedure: RIGHT TOTAL HIP ARTHROPLASTY ANTERIOR APPROACH;  Surgeon: Mcarthur Rossetti, MD;  Location: [facility];  Service: Orthopedics;  Laterality: Right;  . UPPER GASTROINTESTINAL ENDOSCOPY    . VAGINAL HYSTERECTOMY  1975  . WIDE LOCAL EXCISION OF FOURCHETTE AND PERINEUM  04-06-2009   VULVAR CARCINOMA IN SITU   Social History   Occupational History  . driver Other    [employer]   Social History Main Topics  . Smoking status: Former Smoker    Packs/day: 2.00    Years: 25.00    Types: Cigarettes    Quit date: 06/03/1998  . Smokeless tobacco: Never Used  . Alcohol use Yes     Comment: rarely has a glass of wine  . Drug use: No  . Sexual activity: Not Currently    Partners: Male

## 2016-08-02 ENCOUNTER — Other Ambulatory Visit (INDEPENDENT_AMBULATORY_CARE_PROVIDER_SITE_OTHER): Payer: Self-pay

## 2016-08-02 DIAGNOSIS — M545 Low back pain: Secondary | ICD-10-CM

## 2016-08-05 ENCOUNTER — Telehealth (INDEPENDENT_AMBULATORY_CARE_PROVIDER_SITE_OTHER): Payer: Self-pay | Admitting: Orthopaedic Surgery

## 2016-08-05 ENCOUNTER — Telehealth (INDEPENDENT_AMBULATORY_CARE_PROVIDER_SITE_OTHER): Payer: Self-pay

## 2016-08-05 DIAGNOSIS — F411 Generalized anxiety disorder: Secondary | ICD-10-CM

## 2016-08-05 NOTE — Telephone Encounter (Signed)
Have her try tylenol #3, 1-2 every 6-8 hours as needed, #60

## 2016-08-05 NOTE — Telephone Encounter (Signed)
Called into pharmacy

## 2016-08-05 NOTE — Telephone Encounter (Signed)
Patient is scheduled for injection next week and would like something to relax her.Uses [facility] in [place].

## 2016-08-05 NOTE — Telephone Encounter (Signed)
Patient is requesting a refill for pain medication. States she was not able to bear weight this past weekend.  She states the pain is severe. Cb#: [phone] [facility] [address] [facility] [place]

## 2016-08-05 NOTE — Telephone Encounter (Signed)
Please advise 

## 2016-08-06 MED ORDER — HYDROXYZINE PAMOATE 50 MG PO CAPS: ORAL_CAPSULE | ORAL | 1 refills | Status: DC

## 2016-08-06 NOTE — Addendum Note (Signed)
Addended by: Raymondo Band on: 08/06/2016 01:00 PM   Modules accepted: Orders

## 2016-08-06 NOTE — Telephone Encounter (Signed)
Sent in hydroxyzine for procedure

## 2016-08-07 ENCOUNTER — Telehealth (INDEPENDENT_AMBULATORY_CARE_PROVIDER_SITE_OTHER): Payer: Self-pay | Admitting: Orthopaedic Surgery

## 2016-08-07 MED ORDER — HYDROCODONE-ACETAMINOPHEN 5-325 MG PO TABS: 1.0000 | ORAL_TABLET | Freq: Three times a day (TID) | ORAL | 0 refills | Status: DC | PRN

## 2016-08-07 NOTE — Telephone Encounter (Signed)
Patient states that the Tylenol is not helping with her pain and she's unable to work.

## 2016-08-07 NOTE — Telephone Encounter (Signed)
Please advise 

## 2016-08-07 NOTE — Telephone Encounter (Signed)
Can come pick up norco (hydrocodone), but to use very sparingly and to not drive while taking it

## 2016-08-07 NOTE — Telephone Encounter (Signed)
Patient aware Rx ready at front desk and of the below message

## 2016-08-13 ENCOUNTER — Encounter (INDEPENDENT_AMBULATORY_CARE_PROVIDER_SITE_OTHER): Payer: Self-pay

## 2016-08-13 ENCOUNTER — Telehealth (INDEPENDENT_AMBULATORY_CARE_PROVIDER_SITE_OTHER): Payer: Self-pay | Admitting: *Deleted

## 2016-08-13 NOTE — Telephone Encounter (Signed)
Patient aware note at front desk  

## 2016-08-13 NOTE — Telephone Encounter (Signed)
Pt called stating because of the pain in her knee and leg, pt has missed work. Pt has injection tomorrow for this but needs a note for being out of work 3/5 and 3/7 and 3/12, 3/13 and 3/14 pts employer will not let pt come back to work until after injection 3/14. pts shift starts back 3/19. CB: [phone]. 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

## 2016-08-14 ENCOUNTER — Ambulatory Visit (INDEPENDENT_AMBULATORY_CARE_PROVIDER_SITE_OTHER): Payer: Medicare Other

## 2016-08-14 ENCOUNTER — Ambulatory Visit (INDEPENDENT_AMBULATORY_CARE_PROVIDER_SITE_OTHER): Payer: Medicare Other | Admitting: Physical Medicine and Rehabilitation

## 2016-08-14 ENCOUNTER — Encounter (INDEPENDENT_AMBULATORY_CARE_PROVIDER_SITE_OTHER): Payer: Self-pay | Admitting: Physical Medicine and Rehabilitation

## 2016-08-14 VITALS — BP 155/73 | HR 100

## 2016-08-14 DIAGNOSIS — M5416 Radiculopathy, lumbar region: Secondary | ICD-10-CM | POA: Diagnosis not present

## 2016-08-14 MED ORDER — METHYLPREDNISOLONE ACETATE 80 MG/ML IJ SUSP
80.0000 mg | Freq: Once | INTRAMUSCULAR | Status: AC
  Administered 2016-08-14: 80 mg

## 2016-08-14 MED ORDER — LIDOCAINE HCL (PF) 1 % IJ SOLN
0.3300 mL | Freq: Once | INTRAMUSCULAR | Status: AC
  Administered 2016-08-14: 0.3 mL

## 2016-08-14 NOTE — Patient Instructions (Signed)

## 2016-08-14 NOTE — Progress Notes (Signed)
[name] - 71 y.o. female MRN [id number]  Date of birth: 06-24-1945  Office Visit Note: Visit Date: 08/14/2016 PCP: Reginia Naas, MD Referred by: Carol Ada, MD  Subjective: Chief Complaint  Patient presents with  . Lower Back - Pain   HPI: [name] is a 71 year old female that I have actually seen several years ago when she was having a lot of low back and hip pain. She had injections by Dr. Cameron Sprang at [facility]. 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 When I saw her at the time is really was more of a hip issue and she went on to have total hip replacement. She now reports Left leg pain for several months and now right leg starting to bother her as well. Some back pain but mainly just leg pain. Worse on left side. Pain down both legs to ankle and worse on left side and worse around left knee. Difficulty bearing weight.   Wants it noted that she had 3 injections in 2015 at L4 and L5 at the spinal clinic in [place], [place] and had great relief with all three of the injections.   MRI of the lumbar spine is really not very revealing considering the amount of pain that she is having and describes. She describes seriously not being able to bear weight at this point when she is in severe pain. This MRI was from 2017. Part of her symptoms seem to be more L5 related in the left-sided symptoms could be related to L3 but again there is no focal nerve compression no focal disc herniations no focal significant stenosis.     ROS Otherwise per HPI.  Assessment & Plan: Visit Diagnoses:  1. Lumbar radiculopathy     Plan: Findings:  Left L3 transforaminal epidural steroid injection. The patient seemed to be ambulating better after the injection.    Meds & Orders:  Meds ordered this encounter  Medications  . lidocaine (PF) (XYLOCAINE) 1 % injection 0.3 mL  . methylPREDNISolone acetate (DEPO-MEDROL) injection 80 mg    Orders Placed  This Encounter  Procedures  . XR C-ARM NO REPORT  . Epidural Steroid injection    Follow-up: Return if symptoms worsen or fail to improve, for Dr. Ninfa Linden, Benita Stabile, PA.   Procedures: No procedures performed  Lumbosacral Transforaminal Epidural Steroid Injection - Infraneural Approach with Fluoroscopic Guidance  Patient: [name]      Date of Birth: Oct 01, 1945 MRN: [id number] PCP: Reginia Naas, MD      Visit Date: 08/14/2016   Universal Protocol:    Date/Time: 03/16/185:57 AM  Consent Given By: the patient  Position: PRONE   Additional Comments: Vital signs were monitored before and after the procedure. Patient was prepped and draped in the usual sterile fashion. The correct patient, procedure, and site was verified.   Injection Procedure Details:  Procedure Site One Meds Administered:  Meds ordered this encounter  Medications  . lidocaine (PF) (XYLOCAINE) 1 % injection 0.3 mL  . methylPREDNISolone acetate (DEPO-MEDROL) injection 80 mg      Laterality: Left  Location/Site:  L3-L4  Needle size: 22 G  Needle type: Spinal  Needle Placement: Transforaminal  Findings:  -Contrast Used: 1 mL iohexol 180 mg iodine/mL   -Comments: Excellent flow of contrast along the nerve and into the epidural space.  Procedure Details: After squaring off the end-plates of the desired vertebral level to get a true AP view, the C-arm was  obliqued to the painful side so that the superior articulating process is positioned about 1/3 the length of the inferior endplate.  The needle was aimed toward the junction of the superior articular process and the transverse process of the inferior vertebrae. The needle's initial entry is in the lower third of the foramen through Kambin's triangle. The soft tissues overlying this target were infiltrated with 2-3 ml. of 1% Lidocaine without Epinephrine.  The spinal needle was then inserted and advanced toward the target using a  "trajectory" view along the fluoroscope beam.  Under AP and lateral visualization, the needle was advanced so it did not puncture dura and did not traverse medially beyond the 6 o'clock position of the pedicle. Bi-planar projections were used to confirm position. Aspiration was confirmed to be negative for CSF and/or blood. A 1-2 ml. volume of Isovue-250 was injected and flow of contrast was noted at each level. Radiographs were obtained for documentation purposes.   After attaining the desired flow of contrast documented above, a 0.5 to 1.0 ml test dose of 0.25% Marcaine was injected into each respective transforaminal space.  The patient was observed for 90 seconds post injection.  After no sensory deficits were reported, and normal lower extremity motor function was noted,   the above injectate was administered so that equal amounts of the injectate were placed at each foramen (level) into the transforaminal epidural space.   Additional Comments:  The patient tolerated the procedure well Dressing: Band-Aid    Post-procedure details: Patient was observed during the procedure. Post-procedure instructions were reviewed.  Patient left the clinic in stable condition.   Clinical History: Lumbar spine MRI dated 02/14/2016  L3-4: Mild left foraminal stenosis and mild left subarticular lateral recess stenosis due to left lateral recess and foraminal disc protrusion along with intervertebral spurring. Underlying mild disc bulge noted.  L4-5: Borderline bilateral foraminal stenosis due to disc bulge and intervertebral spurring. Prior left laminectomy.  L5-S1: Mild left and borderline right foraminal stenosis due to facet arthropathy, disc bulge, and mild intervertebral spurring.  IMPRESSION: 1. Lumbar spondylosis and degenerative disc disease, causing mild impingement at L3-4 and L5-S 1, as detailed above. 2.  Bilateral renal fluid signal intensity lesions favor cysts. 3. Asymmetric left  iliopsoas muscular atrophy. 4. Marrow heterogeneity is present. Although this can be caused by marrow infiltrative processes, the most common causes include anemia, smoking, obesity, or advancing age.  She reports that she quit smoking about 18 years ago. Her smoking use included Cigarettes. She has a 50.00 pack-year smoking history. She has never used smokeless tobacco. No results for input(s): HGBA1C, LABURIC in the last 8760 hours.  Objective:  VS:  HT:    WT:   BMI:     BP:(!) 155/73  HR:100bpm  TEMP: ( )  RESP:98 % Physical Exam  Musculoskeletal:  The patient ambulates without aid with an antalgic gait to the left. She has good distal strength. She has really significant tenderness to even light palpation around the hip and greater trochanteric area.    Ortho Exam Imaging: No results found.  Past Medical/Family/Surgical/Social History: Medications & Allergies reviewed per EMR Patient Active Problem List   Diagnosis Date Noted  . Osteoarthritis of right hip 02/17/2015  . 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 Atrial tachycardia ([place]) 12/12/2014  . 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 Recurrent dislocation of hip 10/17/2014  . Dislocation of internal left hip prosthesis ([place]) 10/16/2014  .  Ectopic atrial rhythm 10/12/2014  . Atrial bigeminy 10/12/2014  . Osteoarthritis of left hip 09/16/2014  . Status post total replacement of left hip 09/16/2014  . ASCUS favoring benign 09/05/2014  . History of vitamin B deficiency 05/02/2014  . Acute bronchitis 04/18/2014  . Oral aphthous ulcer 04/18/2014  . Edema 12/21/2013  . Irregular heart beat 12/21/2013  . Anuria 11/12/2013  . History of cervical dysplasia 09/21/2013  . Rectocele, female 09/21/2013  . Kidney stone 09/21/2013  . Rash and nonspecific skin eruption 06/25/2013  . Obesity (BMI 30-39.9) 06/16/2013  . IBS (irritable bowel syndrome) 12/22/2012  . Other sleep disturbances  10/27/2012  . Elevated BP 09/29/2012  . Weight gain 08/17/2012  . Osteopenia 08/17/2012  . Vulvar lesion 08/10/2012  . 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 Condyloma acuminatum in female 07/14/2012  . Shingles 07/14/2012  . Vitamin D deficiency 07/14/2012   Past Medical History:  Diagnosis Date  . Arthritis   . DDD (degenerative disc disease), lumbosacral   . Diverticulosis   . Falls    hx of   . GERD (gastroesophageal reflux disease)   . H/O hiatal hernia   . History of diverticulitis of colon   . History of epilepsy    childhood age 31 to 27  ---secondary to high calcium deposts of optic nerve---  none since age 89 with pregency  . History of gastric ulcer   . History of kidney stones   . History of shingles    MARCH 2014--  BACK AREA  . History of vulvar dysplasia    S/P  LASER ABLATION 2014  . IBS (irritable bowel syndrome)   . Mild obstructive sleep apnea    SLEEP STUDY  11-19-2012--  NO CPAP RX (DID NOT MEET CR[facility]IRIA)  . Nodule of right lung    BENIGN--- AND STABLE PER  CT  11/2013  . Osteoporosis   . Renal cyst, left    STABLE PER CT 11/2013  . Seizures ([place])   . Tuberous sclerosis ([facility])    EXTERNAL LESIONS--  MAINLY HANDS (INTERNAL SCLEROTIC BONY LESIONS LUMBAR & PELVIS PER CT 11/2013)   Family History  Problem Relation Age of Onset  . Heart disease Mother   . Tuberous sclerosis Mother   . Uterine cancer Mother   . Tuberous sclerosis Maternal Grandmother   . Tuberous sclerosis Sister   . Tuberous sclerosis Daughter   . Tuberous sclerosis Son     2 sons  . Tuberous sclerosis Maternal Aunt   . Tuberous sclerosis Maternal Aunt   . Tuberous sclerosis Maternal Aunt   . Tuberous sclerosis Cousin    Past Surgical History:  Procedure Laterality Date  . ANTERIOR HIP REVISION Left 10/18/2014   Procedure: EXCHANGE LEFT HIP BALL OF DIRECT ANTERIOR TOTAL HIP ARTHROPLASTY;  Surgeon: Mcarthur Rossetti, MD;  Location: [facility];  Service: Orthopedics;  Laterality:  Left;  . AUGMENTATION MAMMAPLASTY     SALINE  . BLADDER SURGERY  1991   bladder reconstruction of torsion  . CARDIAC CATHETERIZATION  06-26-2001   NORMAL CORONARY ARTERIES  . CATARACT EXTRACTION W/ INTRAOCULAR LENS  IMPLANT, BILATERAL    . CHOLECYSTECTOMY  11-18-2003  . CO2 LASER APPLICATION N/A 01/08/1102   Procedure: CO2 LASER APPLICATION;  Surgeon: Terrance Mass, MD;  Location: [facility];  Service: Gynecology;  Laterality: N/A;CO2 laser of vaginal and vulvar lesions  . COLONOSCOPY    . CYSTOSCOPY WITH RETROGRADE PYELOGRAM, URETEROSCOPY AND STENT PLACEMENT Right 11/22/2013  Procedure: CYSTOSCOPY WITH RETROGRADE PYELOGRAM, URETEROSCOPY AND STENT PLACEMENT;  Surgeon: Sharyn Creamer, MD;  Location: [facility];  Service: Urology;  Laterality: Right;  . CYSTOSCOPY/RETROGRADE/URETEROSCOPY Right 12/01/2013   Procedure: CYSTOSCOPY, RIGHT RETROGRADE/RIGHT DIGITAL URETEROSCOPY, RIGHT URETERAL STENT EXCHANGE;  Surgeon: Sharyn Creamer, MD;  Location: [facility];  Service: Urology;  Laterality: Right;  STAGED RIGHT URETEROSCOPY RIGHT URETER DILATION    . DILATION AND CURETTAGE OF UTERUS  multiple -- last one 1975  . GYNECOLOGIC CRYOSURGERY    . HEMORRHOID SURGERY  1970's  . HIP CLOSED REDUCTION Left 10/16/2014   Procedure: CLOSED MANIPULATION HIP;  Surgeon: Mcarthur Rossetti, MD;  Location: [facility];  Service: Orthopedics;  Laterality: Left;  . HOLMIUM LASER APPLICATION Right 4/0/3524   Procedure: HOLMIUM LASER APPLICATION;  Surgeon: Sharyn Creamer, MD;  Location: [facility];  Service: Urology;  Laterality: Right;  . [place] SURGERY  1985  . ORIF RIGHT BIMALLEOLAR ANKLE FX  12-18-2003  . TOTAL HIP ARTHROPLASTY Left 09/16/2014   Procedure: LEFT TOTAL HIP ARTHROPLASTY ANTERIOR APPROACH;  Surgeon: Mcarthur Rossetti, MD;  Location: [facility];  Service: Orthopedics;  Laterality: Left;  . TOTAL HIP ARTHROPLASTY Right  02/17/2015   Procedure: RIGHT TOTAL HIP ARTHROPLASTY ANTERIOR APPROACH;  Surgeon: Mcarthur Rossetti, MD;  Location: [facility];  Service: Orthopedics;  Laterality: Right;  . UPPER GASTROINTESTINAL ENDOSCOPY    . VAGINAL HYSTERECTOMY  1975  . WIDE LOCAL EXCISION OF [place] AND PERINEUM  04-06-2009   VULVAR CARCINOMA IN SITU   Social History   Occupational History  . driver Other    [employer]   Social History Main Topics  . Smoking status: Former Smoker    Packs/day: 2.00    Years: 25.00    Types: Cigarettes    Quit date: 06/03/1998  . Smokeless tobacco: Never Used  . Alcohol use Yes     Comment: rarely has a glass of wine  . Drug use: No  . Sexual activity: Not Currently    Partners: Male

## 2016-08-15 ENCOUNTER — Ambulatory Visit (INDEPENDENT_AMBULATORY_CARE_PROVIDER_SITE_OTHER): Payer: Self-pay | Admitting: Physician Assistant

## 2016-08-16 DIAGNOSIS — I959 Hypotension, unspecified: Secondary | ICD-10-CM | POA: Diagnosis not present

## 2016-08-16 DIAGNOSIS — R112 Nausea with vomiting, unspecified: Secondary | ICD-10-CM | POA: Diagnosis not present

## 2016-08-16 DIAGNOSIS — R61 Generalized hyperhidrosis: Secondary | ICD-10-CM | POA: Diagnosis not present

## 2016-08-16 NOTE — Procedures (Signed)
Lumbosacral Transforaminal Epidural Steroid Injection - Infraneural Approach with Fluoroscopic Guidance  Patient: [name]      Date of Birth: 02-Oct-1945 MRN: [id number] PCP: Reginia Naas, MD      Visit Date: 08/14/2016   Universal Protocol:    Date/Time: 03/16/185:57 AM  Consent Given By: the patient  Position: PRONE   Additional Comments: Vital signs were monitored before and after the procedure. Patient was prepped and draped in the usual sterile fashion. The correct patient, procedure, and site was verified.   Injection Procedure Details:  Procedure Site One Meds Administered:  Meds ordered this encounter  Medications  . lidocaine (PF) (XYLOCAINE) 1 % injection 0.3 mL  . methylPREDNISolone acetate (DEPO-MEDROL) injection 80 mg      Laterality: Left  Location/Site:  L3-L4  Needle size: 22 G  Needle type: Spinal  Needle Placement: Transforaminal  Findings:  -Contrast Used: 1 mL iohexol 180 mg iodine/mL   -Comments: Excellent flow of contrast along the nerve and into the epidural space.  Procedure Details: After squaring off the end-plates of the desired vertebral level to get a true AP view, the C-arm was obliqued to the painful side so that the superior articulating process is positioned about 1/3 the length of the inferior endplate.  The needle was aimed toward the junction of the superior articular process and the transverse process of the inferior vertebrae. The needle's initial entry is in the lower third of the foramen through Kambin's triangle. The soft tissues overlying this target were infiltrated with 2-3 ml. of 1% Lidocaine without Epinephrine.  The spinal needle was then inserted and advanced toward the target using a "trajectory" view along the fluoroscope beam.  Under AP and lateral visualization, the needle was advanced so it did not puncture dura and did not traverse medially beyond the 6 o'clock position of the pedicle. Bi-planar  projections were used to confirm position. Aspiration was confirmed to be negative for CSF and/or blood. A 1-2 ml. volume of Isovue-250 was injected and flow of contrast was noted at each level. Radiographs were obtained for documentation purposes.   After attaining the desired flow of contrast documented above, a 0.5 to 1.0 ml test dose of 0.25% Marcaine was injected into each respective transforaminal space.  The patient was observed for 90 seconds post injection.  After no sensory deficits were reported, and normal lower extremity motor function was noted,   the above injectate was administered so that equal amounts of the injectate were placed at each foramen (level) into the transforaminal epidural space.   Additional Comments:  The patient tolerated the procedure well Dressing: Band-Aid    Post-procedure details: Patient was observed during the procedure. Post-procedure instructions were reviewed.  Patient left the clinic in stable condition.

## 2016-08-17 ENCOUNTER — Encounter (HOSPITAL_BASED_OUTPATIENT_CLINIC_OR_DEPARTMENT_OTHER): Payer: Self-pay | Admitting: Emergency Medicine

## 2016-08-17 ENCOUNTER — Emergency Department (HOSPITAL_BASED_OUTPATIENT_CLINIC_OR_DEPARTMENT_OTHER): Payer: Medicare Other

## 2016-08-17 ENCOUNTER — Inpatient Hospital Stay (HOSPITAL_BASED_OUTPATIENT_CLINIC_OR_DEPARTMENT_OTHER)
Admission: EM | Admit: 2016-08-17 | Discharge: 2016-08-21 | DRG: 872 | Disposition: A | Discharge status: Home / Self Care | Payer: Medicare Other | Attending: Internal Medicine | Admitting: Internal Medicine

## 2016-08-17 DIAGNOSIS — Z791 Long term (current) use of non-steroidal anti-inflammatories (NSAID): Secondary | ICD-10-CM | POA: Diagnosis not present

## 2016-08-17 DIAGNOSIS — M5430 Sciatica, unspecified side: Secondary | ICD-10-CM | POA: Diagnosis present

## 2016-08-17 DIAGNOSIS — I10 Essential (primary) hypertension: Secondary | ICD-10-CM | POA: Diagnosis not present

## 2016-08-17 DIAGNOSIS — Z8049 Family history of malignant neoplasm of other genital organs: Secondary | ICD-10-CM

## 2016-08-17 DIAGNOSIS — Z8249 Family history of ischemic heart disease and other diseases of the circulatory system: Secondary | ICD-10-CM | POA: Diagnosis not present

## 2016-08-17 DIAGNOSIS — T501X5A Adverse effect of loop [high-ceiling] diuretics, initial encounter: Secondary | ICD-10-CM | POA: Diagnosis present

## 2016-08-17 DIAGNOSIS — N12 Tubulo-interstitial nephritis, not specified as acute or chronic: Secondary | ICD-10-CM

## 2016-08-17 DIAGNOSIS — I491 Atrial premature depolarization: Secondary | ICD-10-CM | POA: Diagnosis not present

## 2016-08-17 DIAGNOSIS — R509 Fever, unspecified: Secondary | ICD-10-CM | POA: Diagnosis not present

## 2016-08-17 DIAGNOSIS — Z96643 Presence of artificial hip joint, bilateral: Secondary | ICD-10-CM | POA: Diagnosis present

## 2016-08-17 DIAGNOSIS — Z87891 Personal history of nicotine dependence: Secondary | ICD-10-CM | POA: Diagnosis not present

## 2016-08-17 DIAGNOSIS — E876 Hypokalemia: Secondary | ICD-10-CM | POA: Diagnosis present

## 2016-08-17 DIAGNOSIS — Q851 Tuberous sclerosis: Secondary | ICD-10-CM | POA: Diagnosis not present

## 2016-08-17 DIAGNOSIS — Z6835 Body mass index (BMI) 35.0-35.9, adult: Secondary | ICD-10-CM

## 2016-08-17 DIAGNOSIS — B9620 Unspecified Escherichia coli [E. coli] as the cause of diseases classified elsewhere: Secondary | ICD-10-CM | POA: Diagnosis present

## 2016-08-17 DIAGNOSIS — Z79899 Other long term (current) drug therapy: Secondary | ICD-10-CM | POA: Diagnosis not present

## 2016-08-17 DIAGNOSIS — R000 Tachycardia, unspecified: Secondary | ICD-10-CM | POA: Insufficient documentation

## 2016-08-17 DIAGNOSIS — N179 Acute kidney failure, unspecified: Secondary | ICD-10-CM | POA: Diagnosis present

## 2016-08-17 DIAGNOSIS — K219 Gastro-esophageal reflux disease without esophagitis: Secondary | ICD-10-CM | POA: Diagnosis present

## 2016-08-17 DIAGNOSIS — E871 Hypo-osmolality and hyponatremia: Secondary | ICD-10-CM | POA: Diagnosis not present

## 2016-08-17 DIAGNOSIS — G4733 Obstructive sleep apnea (adult) (pediatric): Secondary | ICD-10-CM | POA: Diagnosis present

## 2016-08-17 DIAGNOSIS — E669 Obesity, unspecified: Secondary | ICD-10-CM | POA: Diagnosis present

## 2016-08-17 DIAGNOSIS — Z7982 Long term (current) use of aspirin: Secondary | ICD-10-CM

## 2016-08-17 DIAGNOSIS — K5730 Diverticulosis of large intestine without perforation or abscess without bleeding: Secondary | ICD-10-CM | POA: Diagnosis not present

## 2016-08-17 DIAGNOSIS — I5189 Other ill-defined heart diseases: Secondary | ICD-10-CM | POA: Diagnosis present

## 2016-08-17 DIAGNOSIS — M810 Age-related osteoporosis without current pathological fracture: Secondary | ICD-10-CM | POA: Diagnosis present

## 2016-08-17 DIAGNOSIS — R0789 Other chest pain: Secondary | ICD-10-CM

## 2016-08-17 DIAGNOSIS — N10 Acute tubulo-interstitial nephritis: Secondary | ICD-10-CM | POA: Diagnosis not present

## 2016-08-17 DIAGNOSIS — Z91041 Radiographic dye allergy status: Secondary | ICD-10-CM

## 2016-08-17 DIAGNOSIS — A419 Sepsis, unspecified organism: Principal | ICD-10-CM | POA: Diagnosis present

## 2016-08-17 DIAGNOSIS — I952 Hypotension due to drugs: Secondary | ICD-10-CM | POA: Diagnosis present

## 2016-08-17 DIAGNOSIS — E860 Dehydration: Secondary | ICD-10-CM | POA: Diagnosis present

## 2016-08-17 DIAGNOSIS — Z0389 Encounter for observation for other suspected diseases and conditions ruled out: Secondary | ICD-10-CM | POA: Diagnosis not present

## 2016-08-17 DIAGNOSIS — Z888 Allergy status to other drugs, medicaments and biological substances status: Secondary | ICD-10-CM

## 2016-08-17 DIAGNOSIS — R0602 Shortness of breath: Secondary | ICD-10-CM | POA: Diagnosis not present

## 2016-08-17 DIAGNOSIS — R079 Chest pain, unspecified: Secondary | ICD-10-CM | POA: Diagnosis not present

## 2016-08-17 DIAGNOSIS — I519 Heart disease, unspecified: Secondary | ICD-10-CM | POA: Diagnosis not present

## 2016-08-17 DIAGNOSIS — N390 Urinary tract infection, site not specified: Secondary | ICD-10-CM | POA: Insufficient documentation

## 2016-08-17 LAB — URINALYSIS, ROUTINE W REFLEX MICROSCOPIC
Glucose, UA: NEGATIVE mg/dL
Ketones, ur: NEGATIVE mg/dL
Nitrite: POSITIVE — AB
PH: 5.5 (ref 5.0–8.0)
Protein, ur: 100 mg/dL — AB
SPECIFIC GRAVITY, URINE: 1.018 (ref 1.005–1.030)

## 2016-08-17 LAB — CBC WITH DIFFERENTIAL/PLATELET
BASOS PCT: 0 %
Basophils Absolute: 0 10*3/uL (ref 0.0–0.1)
EOS ABS: 0 10*3/uL (ref 0.0–0.7)
EOS PCT: 0 %
HCT: 35.9 % — ABNORMAL LOW (ref 36.0–46.0)
HEMOGLOBIN: 12.5 g/dL (ref 12.0–15.0)
LYMPHS PCT: 4 %
Lymphs Abs: 0.8 10*3/uL (ref 0.7–4.0)
MCH: 27.9 pg (ref 26.0–34.0)
MCHC: 34.8 g/dL (ref 30.0–36.0)
MCV: 80.1 fL (ref 78.0–100.0)
MONOS PCT: 8 %
Monocytes Absolute: 1.5 10*3/uL — ABNORMAL HIGH (ref 0.1–1.0)
NEUTROS ABS: 16.7 10*3/uL — AB (ref 1.7–7.7)
NEUTROS PCT: 88 %
Platelets: 207 10*3/uL (ref 150–400)
RBC: 4.48 MIL/uL (ref 3.87–5.11)
RDW: 14 % (ref 11.5–15.5)
WBC: 19 10*3/uL — ABNORMAL HIGH (ref 4.0–10.5)

## 2016-08-17 LAB — URINALYSIS, MICROSCOPIC (REFLEX)

## 2016-08-17 LAB — COMPREHENSIVE METABOLIC PANEL
ALBUMIN: 3 g/dL — AB (ref 3.5–5.0)
ALT: 37 U/L (ref 14–54)
ANION GAP: 14 (ref 5–15)
AST: 31 U/L (ref 15–41)
Alkaline Phosphatase: 144 U/L — ABNORMAL HIGH (ref 38–126)
BUN: 24 mg/dL — ABNORMAL HIGH (ref 6–20)
CHLORIDE: 93 mmol/L — AB (ref 101–111)
CO2: 21 mmol/L — AB (ref 22–32)
Calcium: 7.4 mg/dL — ABNORMAL LOW (ref 8.9–10.3)
Creatinine, Ser: 1.27 mg/dL — ABNORMAL HIGH (ref 0.44–1.00)
GFR calc non Af Amer: 42 mL/min — ABNORMAL LOW (ref >60)
GFR, EST AFRICAN AMERICAN: 49 mL/min — AB (ref >60)
Glucose, Bld: 171 mg/dL — ABNORMAL HIGH (ref 65–99)
Potassium: 3.2 mmol/L — ABNORMAL LOW (ref 3.5–5.1)
SODIUM: 128 mmol/L — AB (ref 135–145)
Total Bilirubin: 1.5 mg/dL — ABNORMAL HIGH (ref 0.3–1.2)
Total Protein: 7.3 g/dL (ref 6.5–8.1)

## 2016-08-17 LAB — LACTIC ACID, PLASMA: Lactic Acid, Venous: 1.7 mmol/L (ref 0.5–1.9)

## 2016-08-17 LAB — I-STAT CG4 LACTIC ACID, ED
Lactic Acid, Venous: 2.24 mmol/L (ref 0.5–1.9)
Lactic Acid, Venous: 0.62 mmol/L (ref 0.5–1.9)

## 2016-08-17 LAB — TROPONIN I

## 2016-08-17 MED ORDER — SODIUM CHLORIDE 0.9 % IV SOLN
1.0000 g | Freq: Once | INTRAVENOUS | Status: AC
  Administered 2016-08-18: 1 g via INTRAVENOUS
  Filled 2016-08-17: qty 10

## 2016-08-17 MED ORDER — ENOXAPARIN SODIUM 40 MG/0.4ML ~~LOC~~ SOLN
40.0000 mg | Freq: Every day | SUBCUTANEOUS | Status: DC
  Administered 2016-08-18 – 2016-08-21 (×4): 40 mg via SUBCUTANEOUS
  Filled 2016-08-17 (×3): qty 0.4

## 2016-08-17 MED ORDER — ALBUTEROL SULFATE (2.5 MG/3ML) 0.083% IN NEBU
2.5000 mg | INHALATION_SOLUTION | RESPIRATORY_TRACT | Status: DC | PRN
  Administered 2016-08-18: 2.5 mg via RESPIRATORY_TRACT
  Filled 2016-08-17: qty 3

## 2016-08-17 MED ORDER — ACETAMINOPHEN 325 MG PO TABS
650.0000 mg | ORAL_TABLET | Freq: Four times a day (QID) | ORAL | Status: DC | PRN
  Administered 2016-08-18: 650 mg via ORAL
  Filled 2016-08-17 (×3): qty 2

## 2016-08-17 MED ORDER — SODIUM CHLORIDE 0.9 % IV BOLUS (SEPSIS)
1000.0000 mL | Freq: Once | INTRAVENOUS | Status: AC
  Administered 2016-08-17: 1000 mL via INTRAVENOUS

## 2016-08-17 MED ORDER — DEXTROSE 5 % IV SOLN
1.0000 g | Freq: Once | INTRAVENOUS | Status: AC
  Administered 2016-08-17: 1 g via INTRAVENOUS
  Filled 2016-08-17: qty 10

## 2016-08-17 MED ORDER — ONDANSETRON HCL 4 MG/2ML IJ SOLN
4.0000 mg | Freq: Four times a day (QID) | INTRAMUSCULAR | Status: DC | PRN
  Administered 2016-08-18: 4 mg via INTRAVENOUS
  Filled 2016-08-17: qty 2

## 2016-08-17 MED ORDER — SODIUM CHLORIDE 0.9 % IV SOLN
30.0000 meq | Freq: Once | INTRAVENOUS | Status: AC
  Administered 2016-08-18: 30 meq via INTRAVENOUS
  Filled 2016-08-17: qty 15

## 2016-08-17 MED ORDER — ONDANSETRON HCL 4 MG PO TABS: 4.0000 mg | ORAL_TABLET | Freq: Four times a day (QID) | ORAL | Status: DC | PRN

## 2016-08-17 MED ORDER — SODIUM CHLORIDE 0.9 % IV SOLN
INTRAVENOUS | Status: DC
  Administered 2016-08-18: 01:00:00 via INTRAVENOUS

## 2016-08-17 MED ORDER — ACETAMINOPHEN 650 MG RE SUPP: 650.0000 mg | Freq: Four times a day (QID) | RECTAL | Status: DC | PRN

## 2016-08-17 MED ORDER — ACETAMINOPHEN 500 MG PO TABS
1000.0000 mg | ORAL_TABLET | Freq: Once | ORAL | Status: AC
  Administered 2016-08-17: 1000 mg via ORAL
  Filled 2016-08-17: qty 2

## 2016-08-17 MED ORDER — ONDANSETRON HCL 4 MG/2ML IJ SOLN
INTRAMUSCULAR | Status: AC
  Administered 2016-08-17: 4 mg
  Filled 2016-08-17: qty 2

## 2016-08-17 MED ORDER — DEXTROSE 5 % IV SOLN: 1.0000 g | INTRAVENOUS | Status: DC

## 2016-08-17 NOTE — ED Triage Notes (Signed)
N/V/D x 1 week. Went to PCP yesterday and states her BP was 70/50. She says they made her drink a lot of water and sent her home.

## 2016-08-17 NOTE — ED Notes (Signed)
Alert, NAD, calm, interactive, resps e/u, speaking in clear complete sentences, no dyspnea noted, skin W&D, VSS, mentions some low back pain and some mild sob, (denies: nausea, dizziness or visual changes). Denies questions or needs.

## 2016-08-17 NOTE — ED Provider Notes (Signed)
[facility] Provider Note   CSN: [id number] Arrival date & time: 08/17/16  1437  By signing my name below, I, Dora Sims, attest that this documentation has been prepared under the direction and in the presence of physician practitioner, Quintella Reichert, MD. Electronically Signed: Dora Sims, Scribe. 08/17/2016. 3:14 PM.  History   Chief Complaint Chief Complaint  Patient presents with  . Emesis    The history is provided by the patient. No language interpreter was used.    HPI Comments: [name] is a 71 y.o. female not anticoagulated, with PMHx including DDD, diverticulosis, GERD, and atrial tachycardia, who presents to the Emergency Department complaining of persistent nausea, vomiting, and diarrhea beginning 3 days ago. She reports associated shaking chills, sweats, fevers (tmax 101), generalized weakness, and general malaise. She notes she had an episode of chest pain three nights ago which resolved on its own and has had none since. She reports she has been regurgitating most of her food and fluid intake. No alleviating factors noted. Pt notes her symptoms presented the day after having a steroid injection at L4-L5 in her lower back; no pain at the injection site. Pt saw her PCP yesterday for the same and her BP was measured at 78/58; she was advised to come to the ED to receive fluids today if her symptoms persisted. She was also advised by her PCP to discontinue her Lasix for a couple of days to see if it improved her symptoms. She has been urinating normally. Pt denies h/o abdominal surgery but has several documented abdominal surgeries in her medical record. No known sick contacts. No h/o DM or CKD. Pt denies abdominal pain, cough, rashes, dysuria, or any other associated symptoms.  Past Medical History:  Diagnosis Date  . Arthritis   . DDD (degenerative disc disease), lumbosacral   . Diverticulosis   . Falls    hx of   . GERD (gastroesophageal reflux  disease)   . H/O hiatal hernia   . History of diverticulitis of colon   . History of epilepsy    childhood age 75 to 77  ---secondary to high calcium deposts of optic nerve---  none since age 41 with pregency  . History of gastric ulcer   . History of kidney stones   . History of shingles    MARCH 2014--  BACK AREA  . History of vulvar dysplasia    S/P  LASER ABLATION 2014  . IBS (irritable bowel syndrome)   . Mild obstructive sleep apnea    SLEEP STUDY  11-19-2012--  NO CPAP RX (DID NOT MEET CRITIRIA)  . Nodule of right lung    BENIGN--- AND STABLE PER  CT  11/2013  . Osteoporosis   . Renal cyst, left    STABLE PER CT 11/2013  . Seizures ([place])   . 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 Sepsis ([place])   . Acute lower UTI   . Tachycardia   . Osteoarthritis of right hip 02/17/2015  . 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 Atrial tachycardia ([place]) 12/12/2014  . 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 Recurrent dislocation of hip 10/17/2014  . Dislocation of internal left hip prosthesis ([place]) 10/16/2014  . Ectopic atrial rhythm 10/12/2014  . Atrial bigeminy 10/12/2014  . Osteoarthritis  of left hip 09/16/2014  . Status post total replacement of left hip 09/16/2014  . ASCUS favoring benign 09/05/2014  . History of vitamin B deficiency 05/02/2014  . Acute bronchitis 04/18/2014  . Oral aphthous ulcer 04/18/2014  . Edema 12/21/2013  . Irregular heart beat 12/21/2013  . Anuria 11/12/2013  . History of cervical dysplasia 09/21/2013  . Rectocele, female 09/21/2013  . Kidney stone 09/21/2013  . Rash and nonspecific skin eruption 06/25/2013  . Obesity (BMI 30-39.9) 06/16/2013  . IBS (irritable bowel syndrome) 12/22/2012  . Other sleep disturbances 10/27/2012  . Elevated BP 09/29/2012  . Weight  gain 08/17/2012  . Osteopenia 08/17/2012  . Vulvar lesion 08/10/2012  . 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 Condyloma acuminatum in female 07/14/2012  . Shingles 07/14/2012  . Vitamin D deficiency 07/14/2012    Past Surgical History:  Procedure Laterality Date  . ANTERIOR HIP REVISION Left 10/18/2014   Procedure: EXCHANGE LEFT HIP BALL OF DIRECT ANTERIOR TOTAL HIP ARTHROPLASTY;  Surgeon: Mcarthur Rossetti, MD;  Location: [facility];  Service: Orthopedics;  Laterality: Left;  . AUGMENTATION MAMMAPLASTY     SALINE  . BLADDER SURGERY  1991   bladder reconstruction of torsion  . CARDIAC CATHETERIZATION  06-26-2001   NORMAL CORONARY ARTERIES  . CATARACT EXTRACTION W/ INTRAOCULAR LENS  IMPLANT, BILATERAL    . CHOLECYSTECTOMY  11-18-2003  . CO2 LASER APPLICATION N/A 11/02/8364   Procedure: CO2 LASER APPLICATION;  Surgeon: Terrance Mass, MD;  Location: [facility];  Service: Gynecology;  Laterality: N/A;CO2 laser of vaginal and vulvar lesions  . COLONOSCOPY    . CYSTOSCOPY WITH RETROGRADE PYELOGRAM, URETEROSCOPY AND STENT PLACEMENT Right 11/22/2013   Procedure: CYSTOSCOPY WITH RETROGRADE PYELOGRAM, URETEROSCOPY AND STENT PLACEMENT;  Surgeon: Sharyn Creamer, MD;  Location: [facility];  Service: Urology;  Laterality: Right;  . CYSTOSCOPY/RETROGRADE/URETEROSCOPY Right 12/01/2013   Procedure: CYSTOSCOPY, RIGHT RETROGRADE/RIGHT DIGITAL URETEROSCOPY, RIGHT URETERAL STENT EXCHANGE;  Surgeon: Sharyn Creamer, MD;  Location: [facility];  Service: Urology;  Laterality: Right;  STAGED RIGHT URETEROSCOPY RIGHT URETER DILATION    . DILATION AND CURETTAGE OF UTERUS  multiple -- last one 1975  . GYNECOLOGIC CRYOSURGERY    . HEMORRHOID SURGERY  1970's  . HIP CLOSED REDUCTION Left 10/16/2014   Procedure: CLOSED MANIPULATION HIP;  Surgeon: Mcarthur Rossetti, MD;  Location: [facility];  Service: Orthopedics;  Laterality: Left;  . HOLMIUM LASER APPLICATION  Right 07/13/4763   Procedure: HOLMIUM LASER APPLICATION;  Surgeon: Sharyn Creamer, MD;  Location[facility];  Service: Urology;  Laterality: Right;  . [place] SURGERY  1985  . ORIF RIGHT BIMALLEOLAR ANKLE FX  12-18-2003  . TOTAL HIP ARTHROPLASTY Left 09/16/2014   Procedure: LEFT TOTAL HIP ARTHROPLASTY ANTERIOR APPROACH;  Surgeon: Mcarthur Rossetti, MD;  Location: [facility];  Service: Orthopedics;  Laterality: Left;  . TOTAL HIP ARTHROPLASTY Right 02/17/2015   Procedure: RIGHT TOTAL HIP ARTHROPLASTY ANTERIOR APPROACH;  Surgeon: Mcarthur Rossetti, MD;  Location: [facility];  Service: Orthopedics;  Laterality: Right;  . UPPER GASTROINTESTINAL ENDOSCOPY    . VAGINAL HYSTERECTOMY  1975  . WIDE LOCAL EXCISION OF FOUR[place]TTE AND PERINEUM  04-06-2009   VULVAR CARCINOMA IN SITU    OB History    Gravida Para Term Preterm AB Living   2 2 0 0 0 2   SAB TAB Ectopic Multiple Live Births   0 0 0 0  Home Medications    Prior to Admission medications   Medication Sig Start Date End Date Taking? Authorizing Provider  aspirin EC 325 MG EC tablet Take 1 tablet (325 mg total) by mouth 2 (two) times daily after a meal. Patient taking differently: Take 325 mg by mouth daily.  02/20/15  Yes Mcarthur Rossetti, MD  cyclobenzaprine (FLEXERIL) 5 MG tablet Take 2 tablets (10 mg total) by mouth 3 (three) times daily as needed for muscle spasms. 02/03/16  Yes Dorie Rank, MD  furosemide (LASIX) 20 MG tablet TAKE 1 TABLET BY MOUTH DAILY Patient taking differently: TAKE 1 TABLET BY MOUTH DAILY IN THE MORNING 07/11/14  Yes Yvonne R Lowne Chase, DO  HYDROcodone-acetaminophen (NORCO/VICODIN) 5-325 MG tablet Take 1 tablet by mouth 3 (three) times daily as needed for moderate pain. 08/07/16  Yes Mcarthur Rossetti, MD  hydrOXYzine (VISTARIL) 50 MG capsule Take 1 PO by mouth 2 hours prior to procedure, may repeat if needed. 08/06/16  Yes Magnus Sinning, MD  methocarbamol (ROBAXIN) 500 MG  tablet Take 1 tablet (500 mg total) by mouth every 6 (six) hours as needed for muscle spasms. 02/20/15  Yes Mcarthur Rossetti, MD  metoprolol succinate (TOPROL-XL) 25 MG 24 hr tablet TAKE 1 TABLET (25 MG TOTAL) BY MOUTH AT BEDTIME. 02/08/16  Yes Sherran Needs, NP  naproxen (NAPROSYN) 500 MG tablet Take 1 tablet (500 mg total) by mouth 2 (two) times daily. 02/03/16  Yes Dorie Rank, MD  valACYclovir (VALTREX) 1000 MG tablet Take 1 tablet (1,000 mg total) by mouth 3 (three) times daily as needed (for outbreaks). 10/21/14  Yes Mcarthur Rossetti, MD  acetaminophen (TYLENOL) 500 MG tablet Take 500 mg by mouth every 6 (six) hours as needed for mild pain or headache.    Historical Provider, MD  cholecalciferol (VITAMIN D) 1000 UNITS tablet Take 4,000 Units by mouth every morning.     Historical Provider, MD  KLOR-CON M20 20 MEQ tablet Take 1 tablet by mouth every morning. 12/29/14   Historical Provider, MD  omeprazole (PRILOSEC) 40 MG capsule Take 40 mg by mouth every morning.     Historical Provider, MD    Family History Family History  Problem Relation Age of Onset  . Heart disease Mother   . Tuberous sclerosis Mother   . Uterine cancer Mother   . Tuberous sclerosis Maternal Grandmother   . Tuberous sclerosis Sister   . Tuberous sclerosis Daughter   . Tuberous sclerosis Son     2 sons  . Tuberous sclerosis Maternal Aunt   . Tuberous sclerosis Maternal Aunt   . Tuberous sclerosis Maternal Aunt   . Tuberous sclerosis Cousin     Social History Social History  Substance Use Topics  . Smoking status: Former Smoker    Packs/day: 2.00    Years: 25.00    Types: Cigarettes    Quit date: 06/03/1998  . Smokeless tobacco: Never Used  . Alcohol use Yes     Comment: rarely has a glass of wine     Allergies   Other; Zoloft [sertraline hcl]; Percocet [oxycodone-acetaminophen]; and Contrast media [iodinated diagnostic agents]   Review of Systems Review of Systems  Constitutional: Positive  for chills, diaphoresis and fever.  Respiratory: Negative for cough.   Cardiovascular: Positive for chest pain (resolved).  Gastrointestinal: Positive for diarrhea, nausea and vomiting. Negative for abdominal pain.  Genitourinary: Negative for decreased urine volume and dysuria.  Musculoskeletal: Positive for back pain (baseline).  Skin: Negative for  rash.  Neurological: Positive for weakness (generalized).  All other systems reviewed and are negative.   Physical Exam Updated Vital Signs BP 109/66   Pulse (!) 101   Temp 97.8 F (36.6 C)   Resp 16   Ht 5\' 2"  (1.575 m)   Wt 195 lb (88.5 kg)   SpO2 97%   BMI 35.67 kg/m   Physical Exam  Constitutional: She is oriented to person, place, and time. She appears well-developed and well-nourished.  HENT:  Head: Normocephalic and atraumatic.  Cardiovascular: Regular rhythm.  Tachycardia present.   No murmur heard. Pulmonary/Chest: Effort normal and breath sounds normal. No respiratory distress.  Abdominal: Soft. There is tenderness. There is no rebound and no guarding.  Mild RUQ tenderness.  Musculoskeletal: She exhibits no edema or tenderness.  Neurological: She is alert and oriented to person, place, and time.  Skin: Skin is warm and dry.  Psychiatric: She has a normal mood and affect. Her behavior is normal.  Nursing note and vitals reviewed.   ED Treatments / Results  Labs (all labs ordered are listed, but only abnormal results are displayed) Labs Reviewed  COMPREHENSIVE METABOLIC PANEL - Abnormal; Notable for the following:       Result Value   Sodium 128 (*)    Potassium 3.2 (*)    Chloride 93 (*)    CO2 21 (*)    Glucose, Bld 171 (*)    BUN 24 (*)    Creatinine, Ser 1.27 (*)    Calcium 7.4 (*)    Albumin 3.0 (*)    Alkaline Phosphatase 144 (*)    Total Bilirubin 1.5 (*)    GFR calc non Af Amer 42 (*)    GFR calc Af Amer 49 (*)    All other components within normal limits  CBC WITH DIFFERENTIAL/PLATELET -  Abnormal; Notable for the following:    WBC 19.0 (*)    HCT 35.9 (*)    Neutro Abs 16.7 (*)    Monocytes Absolute 1.5 (*)    All other components within normal limits  URINALYSIS, ROUTINE W REFLEX MICROSCOPIC - Abnormal; Notable for the following:    Color, Urine ORANGE (*)    APPearance TURBID (*)    Hgb urine dipstick MODERATE (*)    Bilirubin Urine SMALL (*)    Protein, ur 100 (*)    Nitrite POSITIVE (*)    Leukocytes, UA LARGE (*)    All other components within normal limits  URINALYSIS, MICROSCOPIC (REFLEX) - Abnormal; Notable for the following:    Bacteria, UA MANY (*)    Squamous Epithelial / LPF 6-30 (*)    All other components within normal limits  I-STAT CG4 LACTIC ACID, ED - Abnormal; Notable for the following:    Lactic Acid, Venous 2.24 (*)    All other components within normal limits  CULTURE, BLOOD (ROUTINE X 2)  CULTURE, BLOOD (ROUTINE X 2)  URINE CULTURE  TROPONIN I  LACTIC ACID, PLASMA  LACTIC ACID, PLASMA  CBC  BASIC METABOLIC PANEL  MAGNESIUM  I-STAT CG4 LACTIC ACID, ED    EKG  EKG Interpretation  Date/Time:  Saturday August 17 2016 15:39:23 EDT Ventricular Rate:  110 PR Interval:    QRS Duration: 79 QT Interval:  335 QTC Calculation: 454 R Axis:   -21 Text Interpretation:  Sinus or ectopic atrial tachycardia Borderline left axis deviation Borderline T abnormalities, anterior leads Baseline wander in lead(s) V2 V6 Confirmed by Hazle Coca [phone]) on 08/17/2016 3:47:14  PM       Radiology Ct Abdomen Pelvis Wo Contrast  Result Date: 08/17/2016 CLINICAL DATA:  71 year old female with history of persistent nausea, vomiting and diarrhea beginning 3 days ago with some associated shaking chills, sweats and fevers (temperature to 101 degrees Fahrenheit). Generalize weakness and malaise. EXAM: CT ABDOMEN AND PELVIS WITHOUT CONTRAST TECHNIQUE: Multidetector CT imaging of the abdomen and pelvis was performed following the standard protocol without IV contrast.  COMPARISON:  CT the abdomen and pelvis 11/12/2013. FINDINGS: Lower chest: Right-sided Bochdalek's hernia. Bilateral breast implants with extensive capsular calcification. Aortic atherosclerosis. 3 mm nodule in the right middle lobe (image 2 of series 4) is unchanged compared to 11/12/2013, considered benign. Hepatobiliary: No definite cystic or solid hepatic lesions are identified on today's noncontrast CT examination. Status post cholecystectomy. Pancreas: No pancreatic mass or peripancreatic inflammatory changes are noted on today's noncontrast CT examination. Spleen: Unremarkable. Adrenals/Urinary Tract: 2 mm nonobstructive calculus in the interpolar collecting system of the right kidney. No additional calculi are identified within the collecting system of the left kidney, along the well visualized portions of either ureter, or within the well visualized portions of the urinary bladder (much of the distal third of the ureters bilaterally and much of the urinary bladder is completely obscured by beam hardening artifact from patient's bilateral hip arthroplasties). There is some subtle soft tissue stranding throughout the right perinephric fat. No hydroureteronephrosis. Partially exophytic 1 cm intermediate attenuation (34 HU) lesion in the interpolar region of the left kidney is incompletely characterized on today's noncontrast CT examination, but is statistically likely a proteinaceous/hemorrhagic cyst. Other 10 mm low-attenuation lesion in the lower pole of the left kidney is also incompletely characterized, but likely a small cyst. Both of these lesions appear stable compared to 2015. Bilateral adrenal glands are normal in appearance. Stomach/Bowel: Unenhanced appearance of the stomach is normal. There is no pathologic dilatation of small bowel or colon. Several colonic diverticulae are noted, particularly in the sigmoid colon, without surrounding inflammatory changes to suggest an acute diverticulitis at this  time. Normal appendix. Vascular/Lymphatic: Aortic atherosclerosis, without definite aneurysm in the abdominal or pelvic vasculature. No lymphadenopathy noted in the abdomen or pelvis. Reproductive: Beam hardening artifact obscures the low anatomic pelvis. Probable hysterectomy. Ovaries are not confidently identified may be surgically absent or atrophic. Other: No significant volume of ascites.  No pneumoperitoneum. Musculoskeletal: Numerous sclerotic lesions are noted throughout the visualized axial and appendicular skeleton, concerning for metastatic disease, however, these appear relatively stable compared to the prior examination from 2015. IMPRESSION: 1. Soft tissue stranding in the perinephric fat adjacent to the right kidney. This could be related to a recently passed stone, or could be indicative of an acute process such as pyelonephritis. Clinical correlation is recommended. 2. 2 mm nonobstructive calculus in the interpolar collecting system of the right kidney. No definite ureteral calculi are noted at this time. 3. Colonic diverticulosis without evidence of acute diverticulitis at this time. 4. Aortic atherosclerosis. 5. Widespread sclerotic lesions throughout the visualized axial and appendicular skeleton. This appearance is concerning for metastatic disease to the bones, however, the stability over time compared to prior study from 2015 favors a benign etiology (unless these are old treated metastatic lesions). 6. Additional incidental findings, as above. Electronically Signed   By: Vinnie Langton M.D.   On: 08/17/2016 16:56   Dg Chest 2 View  Result Date: 08/17/2016 CLINICAL DATA:  71 year old female with history of low blood pressure and chest pain 3 nights ago  which has now resolved. Nausea, chills and fever. EXAM: CHEST  2 VIEW COMPARISON:  Chest x-ray 12/22/2015. FINDINGS: Lung volumes are normal. No consolidative airspace disease. No pleural effusions. No pneumothorax. No pulmonary nodule or  mass noted. Pulmonary vasculature and the cardiomediastinal silhouette are within normal limits. Bilateral breast implants incidentally noted. IMPRESSION: No radiographic evidence of acute cardiopulmonary disease. Electronically Signed   By: Vinnie Langton M.D.   On: 08/17/2016 17:04    Procedures Procedures (including critical care time)  DIAGNOSTIC STUDIES: Oxygen Saturation is 99% on RA, normal by my interpretation.    COORDINATION OF CARE: 3:22 PM Discussed treatment plan with pt at bedside and pt agreed to plan.  Medications Ordered in ED Medications  enoxaparin (LOVENOX) injection 40 mg (not administered)  ondansetron (ZOFRAN) tablet 4 mg (not administered)    Or  ondansetron (ZOFRAN) injection 4 mg (not administered)  acetaminophen (TYLENOL) tablet 650 mg (not administered)    Or  acetaminophen (TYLENOL) suppository 650 mg (not administered)  albuterol (PROVENTIL) (2.5 MG/3ML) 0.083% nebulizer solution 2.5 mg (not administered)  potassium chloride 30 mEq in sodium chloride 0.9 % 265 mL (KCL MULTIRUN) IVPB (not administered)  cefTRIAXone (ROCEPHIN) 1 g in dextrose 5 % 50 mL IVPB (not administered)  0.9 %  sodium chloride infusion (not administered)  calcium gluconate 1 g in sodium chloride 0.9 % 100 mL IVPB (not administered)  ondansetron (ZOFRAN) 4 MG/2ML injection (4 mg  Given 08/17/16 1515)  sodium chloride 0.9 % bolus 1,000 mL (0 mLs Intravenous Stopped 08/17/16 1750)  sodium chloride 0.9 % bolus 1,000 mL (0 mLs Intravenous Stopped 08/17/16 1929)  cefTRIAXone (ROCEPHIN) 1 g in dextrose 5 % 50 mL IVPB (0 g Intravenous Stopped 08/17/16 1925)  acetaminophen (TYLENOL) tablet 1,000 mg (1,000 mg Oral Given 08/17/16 1758)     Initial Impression / Assessment and Plan / ED Course  I have reviewed the triage vital signs and the nursing notes.  Pertinent labs & imaging results that were available during my care of the patient were reviewed by me and considered in my medical decision  making (see chart for details).     Patient here for evaluation of chills, vomiting after receiving epidural injection. BMP demonstrates hyponatremia with mild elevation in her creatinine. CBC was significant leukocytosis. UA concerning for urinary tract infection. She was treated with IV fluids and antibiotics. Lactate was mildly elevated on initial check. Her lactate did clear after 1 liter IV fluids. Discussed with patient concerns for urinary tract infection with early sepsis. Recommendation for admission for further treatment. Consulted hospitalist service at [facility] for admission and further treatment.  Final Clinical Impressions(s) / ED Diagnoses   Final diagnoses:  RUQ pain  Pyelonephritis    New Prescriptions Current Discharge Medication List    I personally performed the services described in this documentation, which was scribed in my presence. The recorded information has been reviewed and is accurate.    Quintella Reichert, MD 08/18/16 0020

## 2016-08-17 NOTE — ED Notes (Signed)
ED Provider at bedside. 

## 2016-08-17 NOTE — Plan of Care (Signed)
71 yo with chronic back pain,HTN recently had back shot. Presents with fever, chills N/V/D in ER meeting criteria for Sepsis Found to have UTI Latest 128/74 HR 117 on RA 100% Have skipped her metoprolol today Looks good Lactic acid has come down Started on Rocephin   Accepted to tele for persistent tachycardia to tele 6:52 PM Jady Braggs

## 2016-08-17 NOTE — H&P (Signed)
History and Physical    [name]:[id number] DOB: [birth year] DOA: 08/17/2016  Referring MD/NP/PA: Dr. Roel Cluck PCP: Reginia Naas, MD  Patient coming from: [facility] transfer  Chief Complaint: Nausea, vomiting, and diarrhea  HPI: [name] is a 71 y.o. female with medical history significant of tubular sclerosis, epilepsy, nephrolithiasis, and GERD; who presents with complaints of nausea, vomiting, and diarrhea. Symptoms initially started with generalized malaise 4 days ago. The following morning she reports waking up with chills in a cold sweat. Thereafter patient noted having nausea and nonbloody emesis. The following day she noted onset of continuous watery diarrhea. Other associated symptoms include fever up to 101F and complaints of chest pressure that started today. She went to her PCPs office yesterday with similar complaints and blood pressures were noted to have a blood pressure of 78/58. It appears that the patient was advised to hold her Lasix and to calm to the ED to receive fluids if symptoms do not improve. Denies any urinary frequency, dysuria, flank pain, recent sick contacts, cough, or blood in stool/urine.  Patient also gives a history of sciatica which she has not been able to work or ambulate like normal. She had an epidural steroid injection 3 days ago, due to pain. Following the epidural injection she reports that she's been walk without significant pain as she previously had.   ED Course: Upon admission into the emergency department patient was seen to be febrile up to 101.53F, pulse 101-135, respirations up to 30, blood pressure is close 97/49, and O2 saturations maintain a room air. Labs revealed WBC 19, sodium 128, potassium 3.2, chloride 93, CO2 21 BUN 24, creatinine 1.27, calcium 7.4, and lactic acid 2.24. Urinalysis was positive for signs of infection. A CT scan was obtained which showed perinephric stranding around the right kidney, nonobstructive renal  calculi, multiple suspicious bone lesions(noted to be stable from previous CT in 2015), and diverticulosis without diverticulitis. Sepsis protocol was initiated and patient was given full fluid bolus with antibiotics of Rocephin.  Review of Systems: As per HPI otherwise 10 point review of systems negative.   Past Medical History:  Diagnosis Date  . Arthritis   . DDD (degenerative disc disease), lumbosacral   . Diverticulosis   . Falls    hx of   . GERD (gastroesophageal reflux disease)   . H/O hiatal hernia   . History of diverticulitis of colon   . History of epilepsy    childhood age 29 to 44  ---secondary to high calcium deposts of optic nerve---  none since age 46 with pregency  . History of gastric ulcer   . History of kidney stones   . History of shingles    MARCH 2014--  BACK AREA  . History of vulvar dysplasia    S/P  LASER ABLATION 2014  . IBS (irritable bowel syndrome)   . Mild obstructive sleep apnea    SLEEP STUDY  11-19-2012--  NO CPAP RX (DID NOT MEET CRITIRIA)  . Nodule of right lung    BENIGN--- AND STABLE PER  CT  11/2013  . Osteoporosis   . Renal cyst, left    STABLE PER CT 11/2013  . Seizures ([place])   . 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 ANTERIOR HIP REVISION Left 10/18/2014   Procedure: EXCHANGE LEFT HIP BALL OF DIRECT  ANTERIOR TOTAL HIP ARTHROPLASTY;  Surgeon: Mcarthur Rossetti, MD;  Location: [facility];  Service: Orthopedics;  Laterality: Left;  . AUGMENTATION MAMMAPLASTY     SALINE  . BLADDER SURGERY  1991   bladder reconstruction of torsion  . CARDIAC CATHETERIZATION  06-26-2001   NORMAL CORONARY ARTERIES  . CATARACT EXTRACTION W/ INTRAOCULAR LENS  IMPLANT, BILATERAL    . CHOLECYSTECTOMY  11-18-2003  . CO2 LASER APPLICATION N/A 09/06/5033   Procedure: CO2 LASER APPLICATION;  Surgeon: Terrance Mass, MD;   Location: [facility];  Service: Gynecology;  Laterality: N/A;CO2 laser of vaginal and vulvar lesions  . COLONOSCOPY    . CYSTOSCOPY WITH RETROGRADE PYELOGRAM, URETEROSCOPY AND STENT PLACEMENT Right 11/22/2013   Procedure: CYSTOSCOPY WITH RETROGRADE PYELOGRAM, URETEROSCOPY AND STENT PLACEMENT;  Surgeon: Sharyn Creamer, MD;  Location: [facility];  Service: Urology;  Laterality: Right;  . CYSTOSCOPY/RETROGRADE/URETEROSCOPY Right 12/01/2013   Procedure: CYSTOSCOPY, RIGHT RETROGRADE/RIGHT DIGITAL URETEROSCOPY, RIGHT URETERAL STENT EXCHANGE;  Surgeon: Sharyn Creamer, MD;  Location: [facility];  Service: Urology;  Laterality: Right;  STAGED RIGHT URETEROSCOPY RIGHT URETER DILATION    . DILATION AND CURETTAGE OF UTERUS  multiple -- last one 1975  . GYNECOLOGIC CRYOSURGERY    . HEMORRHOID SURGERY  1970's  . HIP CLOSED REDUCTION Left 10/16/2014   Procedure: CLOSED MANIPULATION HIP;  Surgeon: Mcarthur Rossetti, MD;  Location: [facility];  Service: Orthopedics;  Laterality: Left;  . HOLMIUM LASER APPLICATION Right 09/07/5679   Procedure: HOLMIUM LASER APPLICATION;  Surgeon: Sharyn Creamer, MD;  Location: [facility];  Service: Urology;  Laterality: Right;  . [place] SURGERY  1985  . ORIF RIGHT BIMALLEOLAR ANKLE FX  12-18-2003  . TOTAL HIP ARTHROPLASTY Left 09/16/2014   Procedure: LEFT TOTAL HIP ARTHROPLASTY ANTERIOR APPROACH;  Surgeon: Mcarthur Rossetti, MD;  Location: [facility];  Service: Orthopedics;  Laterality: Left;  . TOTAL HIP ARTHROPLASTY Right 02/17/2015   Procedure: RIGHT TOTAL HIP ARTHROPLASTY ANTERIOR APPROACH;  Surgeon: Mcarthur Rossetti, MD;  Location: [facility];  Service: Orthopedics;  Laterality: Right;  . UPPER GASTROINTESTINAL ENDOSCOPY    . VAGINAL HYSTERECTOMY  1975  . WIDE LOCAL EXCISION OF FOURCHETTE AND PERINEUM  04-06-2009   VULVAR CARCINOMA IN SITU     reports that she quit smoking about 18 years ago.  Her smoking use included Cigarettes. She has a 50.00 pack-year smoking history. She has never used smokeless tobacco. She reports that she drinks alcohol. She reports that she does not use drugs.  Allergies  Allergen Reactions  . Other     All anti-depressants pt states makes her feel as if her body is shutting down.  Marland Kitchen Zoloft [Sertraline Hcl]     Pt can not take SSRI - nausea and rash   . Percocet [Oxycodone-Acetaminophen] Nausea And Vomiting  . Contrast Media [Iodinated Diagnostic Agents] Rash    Patient breaks out in hives    Family History  Problem Relation Age of Onset  . Heart disease Mother   . Tuberous sclerosis Mother   . Uterine cancer Mother   . Tuberous sclerosis Maternal Grandmother   . Tuberous sclerosis Sister   . Tuberous sclerosis Daughter   . Tuberous sclerosis Son     2 sons  . Tuberous sclerosis Maternal Aunt   . Tuberous sclerosis Maternal Aunt   . Tuberous sclerosis Maternal Aunt   . Tuberous sclerosis Cousin     Prior to Admission medications  Medication Sig Start Date End Date Taking? Authorizing Provider  aspirin EC 325 MG EC tablet Take 1 tablet (325 mg total) by mouth 2 (two) times daily after a meal. Patient taking differently: Take 325 mg by mouth daily.  02/20/15  Yes Mcarthur Rossetti, MD  cyclobenzaprine (FLEXERIL) 5 MG tablet Take 2 tablets (10 mg total) by mouth 3 (three) times daily as needed for muscle spasms. 02/03/16  Yes Dorie Rank, MD  furosemide (LASIX) 20 MG tablet TAKE 1 TABLET BY MOUTH DAILY Patient taking differently: TAKE 1 TABLET BY MOUTH DAILY IN THE MORNING 07/11/14  Yes Yvonne R Lowne Chase, DO  HYDROcodone-acetaminophen (NORCO/VICODIN) 5-325 MG tablet Take 1 tablet by mouth 3 (three) times daily as needed for moderate pain. 08/07/16  Yes Mcarthur Rossetti, MD  hydrOXYzine (VISTARIL) 50 MG capsule Take 1 PO by mouth 2 hours prior to procedure, may repeat if needed. 08/06/16  Yes Magnus Sinning, MD  methocarbamol (ROBAXIN) 500  MG tablet Take 1 tablet (500 mg total) by mouth every 6 (six) hours as needed for muscle spasms. 02/20/15  Yes Mcarthur Rossetti, MD  metoprolol succinate (TOPROL-XL) 25 MG 24 hr tablet TAKE 1 TABLET (25 MG TOTAL) BY MOUTH AT BEDTIME. 02/08/16  Yes Sherran Needs, NP  naproxen (NAPROSYN) 500 MG tablet Take 1 tablet (500 mg total) by mouth 2 (two) times daily. 02/03/16  Yes Dorie Rank, MD  valACYclovir (VALTREX) 1000 MG tablet Take 1 tablet (1,000 mg total) by mouth 3 (three) times daily as needed (for outbreaks). 10/21/14  Yes Mcarthur Rossetti, MD  acetaminophen (TYLENOL) 500 MG tablet Take 500 mg by mouth every 6 (six) hours as needed for mild pain or headache.    Historical Provider, MD  cholecalciferol (VITAMIN D) 1000 UNITS tablet Take 4,000 Units by mouth every morning.     Historical Provider, MD  KLOR-CON M20 20 MEQ tablet Take 1 tablet by mouth every morning. 12/29/14   Historical Provider, MD  omeprazole (PRILOSEC) 40 MG capsule Take 40 mg by mouth every morning.     Historical Provider, MD    Physical Exam: Constitutional: Obese female that appears to be sick appearing. Vitals:   08/17/16 2000 08/17/16 2030 08/17/16 2100 08/17/16 2215  BP: 113/71 114/67 121/63 109/66  Pulse: (!) 105 (!) 105 (!) 104 (!) 101  Resp: (!) 24 (!) 24 18 16   Temp:    97.8 F (36.6 C)  TempSrc:      SpO2: 99% 99% 100% 97%  Weight:    88.5 kg (195 lb)  Height:    5\' 2"  (1.575 m)   Eyes: PERRL, lids and conjunctivae normal ENMT: Mucous membranes are moist. Posterior pharynx clear of any exudate or lesions.Normal dentition.  Neck: normal, supple, no masses, no thyromegaly Respiratory: clear to auscultation bilaterally, no wheezing, no crackles. Normal respiratory effort. No accessory muscle use.  Cardiovascular: Tachycardic, no murmurs / rubs / gallops. No extremity edema. 2+ pedal pulses. No carotid bruits.  Abdomen: Epigastric to right upper quadrant tenderness, no masses palpated. No  hepatosplenomegaly. Bowel sounds positive.  Musculoskeletal: no clubbing / cyanosis. No joint deformity upper and lower extremities. Good ROM, no contractures. Normal muscle tone.  Skin: no rashes, lesions, ulcers. No induration Neurologic: CN 2-12 grossly intact. Sensation intact, DTR normal. Strength 5/5 in all 4.  Psychiatric: Normal judgment and insight. Alert and oriented x 3. Normal mood.     Labs on Admission: I have personally reviewed following labs and imaging studies  CBC:  Recent Labs Lab 08/17/16 1454  WBC 19.0*  NEUTROABS 16.7*  HGB 12.5  HCT 35.9*  MCV 80.1  PLT 063   Basic Metabolic Panel:  Recent Labs Lab 08/17/16 1454  NA 128*  K 3.2*  CL 93*  CO2 21*  GLUCOSE 171*  BUN 24*  CREATININE 1.27*  CALCIUM 7.4*   GFR: Estimated Creatinine Clearance: 43.2 mL/min (A) (by C-G formula based on SCr of 1.27 mg/dL (H)). Liver Function Tests:  Recent Labs Lab 08/17/16 1454  AST 31  ALT 37  ALKPHOS 144*  BILITOT 1.5*  PROT 7.3  ALBUMIN 3.0*   No results for input(s): LIPASE, AMYLASE in the last 168 hours. No results for input(s): AMMONIA in the last 168 hours. Coagulation Profile: No results for input(s): INR, PROTIME in the last 168 hours. Cardiac Enzymes:  Recent Labs Lab 08/17/16 1454  TROPONINI <0.03   BNP (last 3 results) No results for input(s): PROBNP in the last 8760 hours. HbA1C: No results for input(s): HGBA1C in the last 72 hours. CBG: No results for input(s): GLUCAP in the last 168 hours. Lipid Profile: No results for input(s): CHOL, HDL, LDLCALC, TRIG, CHOLHDL, LDLDIRECT in the last 72 hours. Thyroid Function Tests: No results for input(s): TSH, T4TOTAL, FREET4, T3FREE, THYROIDAB in the last 72 hours. Anemia Panel: No results for input(s): VITAMINB12, FOLATE, FERRITIN, TIBC, IRON, RETICCTPCT in the last 72 hours. Urine analysis:    Component Value Date/Time   COLORURINE ORANGE (A) 08/17/2016 1720   APPEARANCEUR TURBID (A)  08/17/2016 1720   LABSPEC 1.018 08/17/2016 1720   PHURINE 5.5 08/17/2016 1720   GLUCOSEU NEGATIVE 08/17/2016 1720   HGBUR MODERATE (A) 08/17/2016 1720   BILIRUBINUR SMALL (A) 08/17/2016 1720   BILIRUBINUR neg 02/01/2014 1400   KETONESUR NEGATIVE 08/17/2016 1720   PROTEINUR 100 (A) 08/17/2016 1720   UROBILINOGEN 0.2 02/01/2014 1400   UROBILINOGEN 1.0 11/12/2013 1712   NITRITE POSITIVE (A) 08/17/2016 1720   LEUKOCYTESUR LARGE (A) 08/17/2016 1720   Sepsis Labs: Recent Results (from the past 240 hour(s))  Culture, blood (routine x 2)     Status: None (Preliminary result)   Collection Time: 08/17/16  5:50 PM  Result Value Ref Range Status   Specimen Description BLOOD RIGHT HAND  Final   Special Requests BOTTLES DRAWN AEROBIC AND ANAEROBIC 5ML EACH  Final   Culture PENDING  Incomplete   Report Status PENDING  Incomplete  Culture, blood (routine x 2)     Status: None (Preliminary result)   Collection Time: 08/17/16  6:00 PM  Result Value Ref Range Status   Specimen Description BLOOD LEFT ANTECUBITAL  Final   Special Requests BOTTLES DRAWN AEROBIC AND ANAEROBIC 5ML EACH  Final   Culture PENDING  Incomplete   Report Status PENDING  Incomplete     Radiological Exams on Admission: Ct Abdomen Pelvis Wo Contrast  Result Date: 08/17/2016 CLINICAL DATA:  71 year old female with history of persistent nausea, vomiting and diarrhea beginning 3 days ago with some associated shaking chills, sweats and fevers (temperature to 101 degrees Fahrenheit). Generalize weakness and malaise. EXAM: CT ABDOMEN AND PELVIS WITHOUT CONTRAST TECHNIQUE: Multidetector CT imaging of the abdomen and pelvis was performed following the standard protocol without IV contrast. COMPARISON:  CT the abdomen and pelvis 11/12/2013. FINDINGS: Lower chest: Right-sided Bochdalek's hernia. Bilateral breast implants with extensive capsular calcification. Aortic atherosclerosis. 3 mm nodule in the right middle lobe (image 2 of series 4)  is unchanged compared to 11/12/2013, considered benign. Hepatobiliary:  No definite cystic or solid hepatic lesions are identified on today's noncontrast CT examination. Status post cholecystectomy. Pancreas: No pancreatic mass or peripancreatic inflammatory changes are noted on today's noncontrast CT examination. Spleen: Unremarkable. Adrenals/Urinary Tract: 2 mm nonobstructive calculus in the interpolar collecting system of the right kidney. No additional calculi are identified within the collecting system of the left kidney, along the well visualized portions of either ureter, or within the well visualized portions of the urinary bladder (much of the distal third of the ureters bilaterally and much of the urinary bladder is completely obscured by beam hardening artifact from patient's bilateral hip arthroplasties). There is some subtle soft tissue stranding throughout the right perinephric fat. No hydroureteronephrosis. Partially exophytic 1 cm intermediate attenuation (34 HU) lesion in the interpolar region of the left kidney is incompletely characterized on today's noncontrast CT examination, but is statistically likely a proteinaceous/hemorrhagic cyst. Other 10 mm low-attenuation lesion in the lower pole of the left kidney is also incompletely characterized, but likely a small cyst. Both of these lesions appear stable compared to 2015. Bilateral adrenal glands are normal in appearance. Stomach/Bowel: Unenhanced appearance of the stomach is normal. There is no pathologic dilatation of small bowel or colon. Several colonic diverticulae are noted, particularly in the sigmoid colon, without surrounding inflammatory changes to suggest an acute diverticulitis at this time. Normal appendix. Vascular/Lymphatic: Aortic atherosclerosis, without definite aneurysm in the abdominal or pelvic vasculature. No lymphadenopathy noted in the abdomen or pelvis. Reproductive: Beam hardening artifact obscures the low anatomic  pelvis. Probable hysterectomy. Ovaries are not confidently identified may be surgically absent or atrophic. Other: No significant volume of ascites.  No pneumoperitoneum. Musculoskeletal: Numerous sclerotic lesions are noted throughout the visualized axial and appendicular skeleton, concerning for metastatic disease, however, these appear relatively stable compared to the prior examination from 2015. IMPRESSION: 1. Soft tissue stranding in the perinephric fat adjacent to the right kidney. This could be related to a recently passed stone, or could be indicative of an acute process such as pyelonephritis. Clinical correlation is recommended. 2. 2 mm nonobstructive calculus in the interpolar collecting system of the right kidney. No definite ureteral calculi are noted at this time. 3. Colonic diverticulosis without evidence of acute diverticulitis at this time. 4. Aortic atherosclerosis. 5. Widespread sclerotic lesions throughout the visualized axial and appendicular skeleton. This appearance is concerning for metastatic disease to the bones, however, the stability over time compared to prior study from 2015 favors a benign etiology (unless these are old treated metastatic lesions). 6. Additional incidental findings, as above. Electronically Signed   By: Vinnie Langton M.D.   On: 08/17/2016 16:56   Dg Chest 2 View  Result Date: 08/17/2016 CLINICAL DATA:  71 year old female with history of low blood pressure and chest pain 3 nights ago which has now resolved. Nausea, chills and fever. EXAM: CHEST  2 VIEW COMPARISON:  Chest x-ray 12/22/2015. FINDINGS: Lung volumes are normal. No consolidative airspace disease. No pleural effusions. No pneumothorax. No pulmonary nodule or mass noted. Pulmonary vasculature and the cardiomediastinal silhouette are within normal limits. Bilateral breast implants incidentally noted. IMPRESSION: No radiographic evidence of acute cardiopulmonary disease. Electronically Signed   By: Vinnie Langton M.D.   On: 08/17/2016 17:04    EKG: Independently reviewed. Sinus or ectopic atrial tachycardia  Assessment/Plan Sepsis 2/2  pyelonephritis: Acute. Patient presents with abnormal UA and CT abd shows sign of right perinephric stranding. Also seen to  have nephrolithiasis without signs of acute obstruction. - Admit  to a telemetry bed - Follow-up urine/blood cultures - Continue empiric antibiotics of ceftriaxone per pharmacy - Tylenol prn fever  Nausea, vomiting, and diarrhea: Acute. Question possibility of symptoms secondary to pyelonephritis versus gastroenteritis. Diarrhea stopped over the last 24 hours - zofran prn N/V - IVF - Monitor I&Os  Chest pressure: Initial chest x-ray was clear, troponin negative, and EKG showed no significant ischemic changes. - Trend troponins - Consider need of further workup, if symptoms persist  Acute kidney injury: Baseline creatinine previously noted to be 0.4-0.7, however patient presents with BUN 24 and creatinine 1.27. Elevated BUN to creatinine ratio to suggest prerenal injury. Patient was noted to have recent discontinued patient of Lasix by PCP due to hypotension. - IVF NS at 100 ml/hr   - Follow-up repeat BMP in a.m.  Essential hypertension/ atrial ectopic rhythm - Continue metoprolol  Diastolic dysfunction: Last echocardiogram showing EF of 65-70% with grade 1 diastolic dysfunction in 02/4326. - strict I&Os - Daily weights  Hypokalemia and hypocalcemia: Potassium 3.2 and corrected calcium 8.2 on admission. - Give 30 mEq of potassium chloride IV 1 dose - Give 1 g of calcium gluconate IV 1 dose  - Check a magnesium level in a.m. - Continue to monitor and replace as needed  Sciatica: Patient status post steroid injection the lumbar spine 3 days ago. - Continue Robaxin prn muscle spasm and hydrocodone prn pain  Hyponatremia: Sodium 128 on admission. Suspect secondary to above along with patient on Lasix. - Continue to  monitor  H/O tubular sclerosis  GERD - Pharmacy substitution of Protonix  DVT prophylaxis: Lovenox  Code Status:  Full Family Communication:  No family present at bedside Disposition Plan:  Likely discharge home in 2-3 days. Consults called: None Admission status: Inpatient  Norval Morton MD [facility] Pager [phone]  If 7PM-7AM, please contact night-coverage [email or web address] Password [facility]  08/17/2016, 10:44 PM

## 2016-08-18 DIAGNOSIS — N179 Acute kidney failure, unspecified: Secondary | ICD-10-CM | POA: Diagnosis present

## 2016-08-18 DIAGNOSIS — I10 Essential (primary) hypertension: Secondary | ICD-10-CM | POA: Diagnosis present

## 2016-08-18 DIAGNOSIS — R0789 Other chest pain: Secondary | ICD-10-CM | POA: Diagnosis present

## 2016-08-18 DIAGNOSIS — N10 Acute tubulo-interstitial nephritis: Secondary | ICD-10-CM | POA: Diagnosis present

## 2016-08-18 DIAGNOSIS — I5189 Other ill-defined heart diseases: Secondary | ICD-10-CM | POA: Diagnosis present

## 2016-08-18 DIAGNOSIS — E871 Hypo-osmolality and hyponatremia: Secondary | ICD-10-CM | POA: Diagnosis present

## 2016-08-18 LAB — BASIC METABOLIC PANEL
ANION GAP: 8 (ref 5–15)
BUN: 17 mg/dL (ref 6–20)
CO2: 20 mmol/L — AB (ref 22–32)
Calcium: 7.2 mg/dL — ABNORMAL LOW (ref 8.9–10.3)
Chloride: 104 mmol/L (ref 101–111)
Creatinine, Ser: 0.96 mg/dL (ref 0.44–1.00)
GFR calc Af Amer: >60 mL/min (ref >60)
GFR calc non Af Amer: 59 mL/min — ABNORMAL LOW (ref >60)
GLUCOSE: 119 mg/dL — AB (ref 65–99)
POTASSIUM: 3.3 mmol/L — AB (ref 3.5–5.1)
Sodium: 132 mmol/L — ABNORMAL LOW (ref 135–145)

## 2016-08-18 LAB — TROPONIN I
Troponin I: <0.03 ng/mL (ref <0.03)
Troponin I: <0.03 ng/mL (ref <0.03)

## 2016-08-18 LAB — MAGNESIUM: Magnesium: 1.5 mg/dL — ABNORMAL LOW (ref 1.7–2.4)

## 2016-08-18 LAB — CBC
HEMATOCRIT: 33.3 % — AB (ref 36.0–46.0)
Hemoglobin: 11.2 g/dL — ABNORMAL LOW (ref 12.0–15.0)
MCH: 27.2 pg (ref 26.0–34.0)
MCHC: 33.6 g/dL (ref 30.0–36.0)
MCV: 80.8 fL (ref 78.0–100.0)
Platelets: 179 10*3/uL (ref 150–400)
RBC: 4.12 MIL/uL (ref 3.87–5.11)
RDW: 14.2 % (ref 11.5–15.5)
WBC: 15.6 10*3/uL — AB (ref 4.0–10.5)

## 2016-08-18 LAB — LACTIC ACID, PLASMA
LACTIC ACID, VENOUS: 2.7 mmol/L — AB (ref 0.5–1.9)
Lactic Acid, Venous: 2 mmol/L (ref 0.5–1.9)
Lactic Acid, Venous: 1 mmol/L (ref 0.5–1.9)

## 2016-08-18 MED ORDER — PANTOPRAZOLE SODIUM 40 MG PO TBEC
40.0000 mg | DELAYED_RELEASE_TABLET | Freq: Two times a day (BID) | ORAL | Status: DC
  Administered 2016-08-18 – 2016-08-21 (×7): 40 mg via ORAL
  Filled 2016-08-18 (×7): qty 1

## 2016-08-18 MED ORDER — IPRATROPIUM-ALBUTEROL 0.5-2.5 (3) MG/3ML IN SOLN: 3.0000 mL | RESPIRATORY_TRACT | Status: AC

## 2016-08-18 MED ORDER — HYDROCODONE-ACETAMINOPHEN 5-325 MG PO TABS
1.0000 | ORAL_TABLET | Freq: Three times a day (TID) | ORAL | Status: DC | PRN
  Administered 2016-08-18: 1 via ORAL
  Filled 2016-08-18: qty 1

## 2016-08-18 MED ORDER — DEXTROSE 5 % IV SOLN
3.0000 g | Freq: Once | INTRAVENOUS | Status: AC
  Administered 2016-08-18: 3 g via INTRAVENOUS
  Filled 2016-08-18: qty 6

## 2016-08-18 MED ORDER — METOPROLOL SUCCINATE ER 25 MG PO TB24
25.0000 mg | ORAL_TABLET | Freq: Every day | ORAL | Status: DC
  Administered 2016-08-18 – 2016-08-20 (×4): 25 mg via ORAL
  Filled 2016-08-18 (×4): qty 1

## 2016-08-18 MED ORDER — GI COCKTAIL ~~LOC~~: 30.0000 mL | ORAL | Status: DC

## 2016-08-18 MED ORDER — POTASSIUM CHLORIDE IN NACL 40-0.9 MEQ/L-% IV SOLN
INTRAVENOUS | Status: DC
  Administered 2016-08-18 – 2016-08-19 (×2): 75 mL/h via INTRAVENOUS
  Filled 2016-08-18 (×2): qty 1000

## 2016-08-18 MED ORDER — SODIUM CHLORIDE 0.9 % IV BOLUS (SEPSIS)
500.0000 mL | Freq: Once | INTRAVENOUS | Status: AC
  Administered 2016-08-18: 500 mL via INTRAVENOUS

## 2016-08-18 MED ORDER — VALACYCLOVIR HCL 500 MG PO TABS: 1000.0000 mg | ORAL_TABLET | Freq: Three times a day (TID) | ORAL | Status: DC | PRN

## 2016-08-18 MED ORDER — ASPIRIN EC 325 MG PO TBEC
325.0000 mg | DELAYED_RELEASE_TABLET | Freq: Every day | ORAL | Status: DC
  Administered 2016-08-18 – 2016-08-21 (×4): 325 mg via ORAL
  Filled 2016-08-18 (×4): qty 1

## 2016-08-18 MED ORDER — DEXTROSE 5 % IV SOLN
2.0000 g | INTRAVENOUS | Status: DC
  Administered 2016-08-18 – 2016-08-19 (×2): 2 g via INTRAVENOUS
  Filled 2016-08-18 (×3): qty 2

## 2016-08-18 MED ORDER — METHOCARBAMOL 500 MG PO TABS
500.0000 mg | ORAL_TABLET | Freq: Four times a day (QID) | ORAL | Status: DC | PRN
  Administered 2016-08-19 – 2016-08-20 (×2): 500 mg via ORAL
  Filled 2016-08-18 (×4): qty 1

## 2016-08-18 MED ORDER — POTASSIUM CHLORIDE CRYS ER 20 MEQ PO TBCR
20.0000 meq | EXTENDED_RELEASE_TABLET | Freq: Every day | ORAL | Status: DC
  Administered 2016-08-18 – 2016-08-21 (×4): 20 meq via ORAL
  Filled 2016-08-18 (×4): qty 1

## 2016-08-18 NOTE — Progress Notes (Signed)
New Admission Note:   Arrival Method: [place] from [facility] Mental Orientation:  A & O x 4 Telemetry: Placed on Tele #[id number] Assessment: Completed Skin:  Intact IV:  Left forearm and left AC Pain:  Denies Tubes:  None Safety Measures: Safety Fall Prevention Plan has been given, discussed and signed Admission: Completed [address] [place] Orientation: Patient has been orientated to the room, unit and staff.  Family:  None at bedside  Patient currently has purse, clothes, cell phone, and charger at bedside.  Advised about our Valuable Policy.  She understands and does not want anything taken to the safe.  Orders have been reviewed and implemented. Will continue to monitor the patient. Call light has been placed within reach and bed alarm has been activated.   Earleen Reaper RN- [place], [place] Phone number: [phone]

## 2016-08-18 NOTE — Progress Notes (Signed)
Patient seen and examined  71 y.o. female with medical history significant of tubular sclerosis, epilepsy, nephrolithiasis, and GERD; who presents with complaints of nausea, vomiting, and diarrhea.Upon admission into the emergency department patient was seen to be febrile up to 101.70F, pulse 101-135, respirations up to 30, blood pressure is close 97/49, and O2 saturations maintain a room air. Labs revealed WBC 19, sodium 128, potassium 3.2, chloride 93, CO2 21 BUN 24, creatinine 1.27, calcium 7.4, and lactic acid 2.24. Urinalysis was positive for signs of infection. A CT scan was obtained which showed perinephric stranding around the right kidney, nonobstructive renal calculi, multiple suspicious bone lesions(noted to be stable from previous CT in 2015), and diverticulosis without diverticulitis. Sepsis protocol was initiated and patient was given full fluid bolus with antibiotics of Rocephin.  Assessment and plan Sepsis 2/2  pyelonephritis: Acute. Patient presents with abnormal UA and CT abd shows sign of right perinephric stranding. Also seen to  have nephrolithiasis without signs of acute obstruction. Continue telemetry bed - Follow-up urine/blood cultures - Continue empiric antibiotics of ceftriaxone per pharmacy - Tylenol prn fever Lactic acid 2.7, continue fluid boluses  Nausea, vomiting, and diarrhea: Acute. Question possibility of symptoms secondary to pyelonephritis versus gastroenteritis. Diarrhea stopped over the last 24 hours GI pathogen panel if  diarrhea recurs   Chest pressure: Initial chest x-ray was clear, troponin negative, and EKG showed no significant ischemic changes. - Trend troponins - Consider need of further workup, if symptoms persist  Acute kidney injury: Baseline creatinine previously noted to be 0.4-0.7, however patient presents with BUN 24 and creatinine 1.27. Likely prerenal. Hold Lasix   due to hypotension. Improving, creatinine now 0.96 - Follow-up repeat BMP  in a.m.  Essential hypertension/ atrial ectopic rhythm - Continue metoprolol  Diastolic dysfunction: Last echocardiogram showing EF of 65-70% with grade 1 diastolic dysfunction in 08/2917. - strict I&Os - Daily weights  Hypokalemia and hypocalcemia: Potassium 3.2 and corrected calcium 8.2 on admission. - Give 30 mEq of potassium chloride IV 1 dose - Give 1 g of calcium gluconate IV 1 dose  Magnesium 1.5, repleted  Sciatica: Patient status post steroid injection the lumbar spine 3 days ago. - Continue Robaxin prn muscle spasm and hydrocodone prn pain  Hyponatremia: Sodium 128 on admission secondary to dehydration.  Now 132  H/O tubular sclerosis  GERD - Pharmacy substitution of Protonix

## 2016-08-18 NOTE — Progress Notes (Signed)
During the night, received critical lab results showing that Lactic Acid was 2.0.  Dr. Tamala Julian made aware.  After this, the patient complained of a headache, rating as 5/10.  650 mg PO Tylenol was given at 0503 with minimal results.  At approximately 0610, patient began to complain of mid sternal chest pressure that was rating as 8/10.  The pressure was worse than she had previously experienced in the ED.  She also complained of SOB and nausea.  Lung sounds were clear but diminished.  Her b/p at 0459 was 144/69 HR - 113, 98% on room air.  At 0613, b/p - 109/52, HR - 71, 92% on room air.  Dr. Tamala Julian made aware.  He saw patient at the bedside.  Per MD orders, albuterol breathing treatment and 4 mg IV Zofran given to patient at 0703.  Will continue to monitor patient.  [employer] RN-BC, WTA.

## 2016-08-18 NOTE — Progress Notes (Signed)
Per patient's request, I spoke with her daughter from [place] to give update on her mother's condition.  Questions were answered.  The daughter was concerned about the antibiotics that her mother were currently on.  I explained that at this time, the patient was on Rocephin.  The daughter does not want her mother put on Levaquin unless it is a last resort.  The daughter had a bad reaction to it.  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  I advised that I would make doctors aware.  Will continue to monitor patient.  [employer] RN-BC, WTA.

## 2016-08-19 LAB — CBC
HCT: 33.8 % — ABNORMAL LOW (ref 36.0–46.0)
Hemoglobin: 11.3 g/dL — ABNORMAL LOW (ref 12.0–15.0)
MCH: 27.4 pg (ref 26.0–34.0)
MCHC: 33.4 g/dL (ref 30.0–36.0)
MCV: 81.8 fL (ref 78.0–100.0)
PLATELETS: 197 10*3/uL (ref 150–400)
RBC: 4.13 MIL/uL (ref 3.87–5.11)
RDW: 14.7 % (ref 11.5–15.5)
WBC: 10.7 10*3/uL — ABNORMAL HIGH (ref 4.0–10.5)

## 2016-08-19 LAB — COMPREHENSIVE METABOLIC PANEL
ALBUMIN: 2.2 g/dL — AB (ref 3.5–5.0)
ALK PHOS: 140 U/L — AB (ref 38–126)
ALT: 28 U/L (ref 14–54)
AST: 24 U/L (ref 15–41)
Anion gap: 10 (ref 5–15)
BUN: 9 mg/dL (ref 6–20)
CHLORIDE: 108 mmol/L (ref 101–111)
CO2: 18 mmol/L — AB (ref 22–32)
CREATININE: 0.87 mg/dL (ref 0.44–1.00)
Calcium: 7.2 mg/dL — ABNORMAL LOW (ref 8.9–10.3)
GFR calc non Af Amer: >60 mL/min (ref >60)
Glucose, Bld: 106 mg/dL — ABNORMAL HIGH (ref 65–99)
Potassium: 4 mmol/L (ref 3.5–5.1)
SODIUM: 136 mmol/L (ref 135–145)
Total Bilirubin: 0.8 mg/dL (ref 0.3–1.2)
Total Protein: 5.6 g/dL — ABNORMAL LOW (ref 6.5–8.1)

## 2016-08-19 LAB — MAGNESIUM: MAGNESIUM: 2.3 mg/dL (ref 1.7–2.4)

## 2016-08-19 MED ORDER — ACETAMINOPHEN 325 MG PO TABS
650.0000 mg | ORAL_TABLET | Freq: Four times a day (QID) | ORAL | Status: DC | PRN
  Administered 2016-08-19 – 2016-08-21 (×3): 650 mg via ORAL
  Filled 2016-08-19: qty 2

## 2016-08-19 NOTE — Progress Notes (Addendum)
Daughter in law- Ann Brosky requested that MD Abrol call her to go over POC of the pt.   Have paged/informed MD Abrol of this request.    1730 Paged MD Abrol  To report pt compliant of left calf pain. Moderate swelling in the the left calf, absence of warmth.   Paulla Fore, RN

## 2016-08-19 NOTE — Progress Notes (Signed)
[facility] PROGRESS NOTE  DULA HAVLIK IEP:[id number] DOB: 18-Oct-1945 DOA: 08/17/2016   PCP: Reginia Naas, MD     Assessment/Plan: Principal Problem:   Sepsis ([place]) Active Problems:   Ectopic atrial rhythm   Acute pyelonephritis   Chest pressure   Essential hypertension   Hyponatremia   Diastolic dysfunction   AKI (acute kidney injury) ([place])   71 y.o.femalewith medical history significant oftubular sclerosis, epilepsy, nephrolithiasis,andGERD; who presents with complaints of nausea,vomiting, and diarrhea.Upon admission into the emergency department patient was seen to be febrile up to 101.42F, pulse 101-135, respirations up to 30, blood pressure is close 97/49, and O2 saturations maintain a room air. Labs revealed WBC 19, sodium 128, potassium 3.2, chloride 93, CO2 21 BUN 24, creatinine 1.27, calcium 7.4, and lactic acid 2.24.Urinalysis was positive for signs of infection. A CT scan was obtained which showed perinephric stranding around the right kidney, nonobstructive renal calculi, multiple suspicious bone lesions(noted to be stable from previous CT in 2015), and diverticulosis without diverticulitis. Sepsis protocol was initiated and patient was given full fluid bolus with antibiotics of Rocephin.  Assessment and plan Sepsis 2/2 pyelonephritis: Acute. Patient presents with abnormal UA and CT abd shows sign of right perinephric stranding.Also seen to have nephrolithiasis without signs of acute obstruction, may have passed a kidney stone. Continue telemetry bed - Follow-up urine/blood cultures, no growth so far - Continue empiric antibiotics of ceftriaxone per pharmacy - Tylenol prn fever Continue IV antibiotics until patient's symptoms of nausea resolves, until she is able to take medications PO   dc fluids , patient SOB  Nausea,vomiting, and diarrhea: Acute. Question possibility of symptoms secondary to pyelonephritis versus  gastroenteritis.Diarrhea stopped over the last 24 hours GI pathogen panel if  diarrhea recurs   Chest pressure: Initial chest x-ray was clear, troponin negative,and EKG showed no significant ischemic changes. - Trend troponins - Consider need of further workup,if symptoms persist  Acute kidney injury: Baseline creatinine previously noted to be 0.4-0.7, however patient presents with BUN 24 and creatinine 1.27. Likely prerenal. Hold Lasix , due to low initial blood pressure. Blood pressure parameters now improved - Follow-up repeat BMP in a.m.  Essentialhypertension/ atrial ectopic rhythm - Continue metoprolol  Chronic Diastolic dysfunction without exacerbation: Last echocardiogram showing EF of 65-70% with grade 1 diastolic dysfunction in 11/6061. - strict I&Os - Daily weights  Hypokalemia and hypocalcemia/hypomagnesemia: Potassium 3.2 and corrected calcium 8.2 on admission. Replete electrolytes  Sciatica: Patient status post steroid injection the lumbar spine 3 days ago. - Continue Robaxin prn muscle spasmand hydrocodone prn pain  Hyponatremia: Sodium 128 on admission secondary to dehydration.  Now 132>136  H/O tubular sclerosis  GERD - Pharmacy substitution of Protonix  DVT prophylaxsis lovenox  Code Status:  Full code     Family Communication: Discussed in detail with the patient, all imaging results, lab results explained to the patient   Disposition Plan:  Continue iv antibiotics, dc in 1-2 days       Consultants:  None   Procedures:  None   Antibiotics: Anti-infectives    Start     Dose/Rate Route Frequency Ordered Stop   08/18/16 1800  cefTRIAXone (ROCEPHIN) 1 g in dextrose 5 % 50 mL IVPB  Status:  Discontinued     1 g 100 mL/hr over 30 Minutes Intravenous Every 24 hours 08/17/16 2256 08/18/16 1021   08/18/16 1800  cefTRIAXone (ROCEPHIN) 2 g in dextrose 5 % 50 mL IVPB  2 g 100 mL/hr over 30 Minutes Intravenous Every 24 hours 08/18/16  1021     08/18/16 0303  valACYclovir (VALTREX) tablet 1,000 mg     1,000 mg Oral 3 times daily PRN 08/18/16 0304     08/17/16 1745  cefTRIAXone (ROCEPHIN) 1 g in dextrose 5 % 50 mL IVPB     1 g 100 mL/hr over 30 Minutes Intravenous  Once 08/17/16 1733 08/17/16 1925         HPI/Subjective: Sob , occasional chills  Low grade fever yesterday  Objective: Vitals:   08/18/16 0952 08/18/16 2049 08/19/16 0126 08/19/16 0451  BP: 113/61 (!) 137/54  (!) 111/50  Pulse: 99 93  93  Resp: 18 17  13   Temp: 98.5 F (36.9 C) (!) 100.5 F (38.1 C)  97.9 F (36.6 C)  TempSrc: Oral     SpO2: 100% 97%  100%  Weight:  90.7 kg (199 lb 15.3 oz) 90.7 kg (199 lb 15.3 oz)   Height:        Intake/Output Summary (Last 24 hours) at 08/19/16 0347 Last data filed at 08/19/16 0600  Gross per 24 hour  Intake             1550 ml  Output              640 ml  Net              910 ml    Exam:  Examination:  General exam: Appears calm and comfortable  Respiratory system: Clear to auscultation. Respiratory effort normal. Cardiovascular system: S1 & S2 heard, RRR. No JVD, murmurs, rubs, gallops or clicks. No pedal edema. Gastrointestinal system: Abdomen is nondistended, soft and nontender. No organomegaly or masses felt. Normal bowel sounds heard. Central nervous system: Alert and oriented. No focal neurological deficits. Extremities: Symmetric 5 x 5 power. Skin: No rashes, lesions or ulcers Psychiatry: Judgement and insight appear normal. Mood & affect appropriate.     Data Reviewed: I have personally reviewed following labs and imaging studies  Micro Results Recent Results (from the past 240 hour(s))  Culture, blood (routine x 2)     Status: None (Preliminary result)   Collection Time: 08/17/16  5:50 PM  Result Value Ref Range Status   Specimen Description BLOOD RIGHT HAND  Final   Special Requests BOTTLES DRAWN AEROBIC AND ANAEROBIC 5ML EACH  Final   Culture   Final    NO GROWTH < 24  HOURS Performed at [facility], [address], [place], [place] [address]    Report Status PENDING  Incomplete  Culture, blood (routine x 2)     Status: None (Preliminary result)   Collection Time: 08/17/16  6:00 PM  Result Value Ref Range Status   Specimen Description BLOOD LEFT ANTECUBITAL  Final   Special Requests BOTTLES DRAWN AEROBIC AND ANAEROBIC 5ML EACH  Final   Culture   Final    NO GROWTH < 24 HOURS Performed at [facility], [place] [address], [place], [place] [address]    Report Status PENDING  Incomplete    Radiology Reports Ct Abdomen Pelvis Wo Contrast  Result Date: 08/17/2016 CLINICAL DATA:  71 year old female with history of persistent nausea, vomiting and diarrhea beginning 3 days ago with some associated shaking chills, sweats and fevers (temperature to 101 degrees Fahrenheit). Generalize weakness and malaise. EXAM: CT ABDOMEN AND PELVIS WITHOUT CONTRAST TECHNIQUE: Multidetector CT imaging of the abdomen and pelvis was performed following the  standard protocol without IV contrast. COMPARISON:  CT the abdomen and pelvis 11/12/2013. FINDINGS: Lower chest: Right-sided Bochdalek's hernia. Bilateral breast implants with extensive capsular calcification. Aortic atherosclerosis. 3 mm nodule in the right middle lobe (image 2 of series 4) is unchanged compared to 11/12/2013, considered benign. Hepatobiliary: No definite cystic or solid hepatic lesions are identified on today's noncontrast CT examination. Status post cholecystectomy. Pancreas: No pancreatic mass or peripancreatic inflammatory changes are noted on today's noncontrast CT examination. Spleen: Unremarkable. Adrenals/Urinary Tract: 2 mm nonobstructive calculus in the interpolar collecting system of the right kidney. No additional calculi are identified within the collecting system of the left kidney, along the well visualized portions of either ureter, or within the well visualized portions of the urinary bladder  (much of the distal third of the ureters bilaterally and much of the urinary bladder is completely obscured by beam hardening artifact from patient's bilateral hip arthroplasties). There is some subtle soft tissue stranding throughout the right perinephric fat. No hydroureteronephrosis. Partially exophytic 1 cm intermediate attenuation (34 HU) lesion in the interpolar region of the left kidney is incompletely characterized on today's noncontrast CT examination, but is statistically likely a proteinaceous/hemorrhagic cyst. Other 10 mm low-attenuation lesion in the lower pole of the left kidney is also incompletely characterized, but likely a small cyst. Both of these lesions appear stable compared to 2015. Bilateral adrenal glands are normal in appearance. Stomach/Bowel: Unenhanced appearance of the stomach is normal. There is no pathologic dilatation of small bowel or colon. Several colonic diverticulae are noted, particularly in the sigmoid colon, without surrounding inflammatory changes to suggest an acute diverticulitis at this time. Normal appendix. Vascular/Lymphatic: Aortic atherosclerosis, without definite aneurysm in the abdominal or pelvic vasculature. No lymphadenopathy noted in the abdomen or pelvis. Reproductive: Beam hardening artifact obscures the low anatomic pelvis. Probable hysterectomy. Ovaries are not confidently identified may be surgically absent or atrophic. Other: No significant volume of ascites.  No pneumoperitoneum. Musculoskeletal: Numerous sclerotic lesions are noted throughout the visualized axial and appendicular skeleton, concerning for metastatic disease, however, these appear relatively stable compared to the prior examination from 2015. IMPRESSION: 1. Soft tissue stranding in the perinephric fat adjacent to the right kidney. This could be related to a recently passed stone, or could be indicative of an acute process such as pyelonephritis. Clinical correlation is recommended. 2. 2  mm nonobstructive calculus in the interpolar collecting system of the right kidney. No definite ureteral calculi are noted at this time. 3. Colonic diverticulosis without evidence of acute diverticulitis at this time. 4. Aortic atherosclerosis. 5. Widespread sclerotic lesions throughout the visualized axial and appendicular skeleton. This appearance is concerning for metastatic disease to the bones, however, the stability over time compared to prior study from 2015 favors a benign etiology (unless these are old treated metastatic lesions). 6. Additional incidental findings, as above. Electronically Signed   By: Vinnie Langton M.D.   On: 08/17/2016 16:56   Dg Chest 2 View  Result Date: 08/17/2016 CLINICAL DATA:  71 year old female with history of low blood pressure and chest pain 3 nights ago which has now resolved. Nausea, chills and fever. EXAM: CHEST  2 VIEW COMPARISON:  Chest x-ray 12/22/2015. FINDINGS: Lung volumes are normal. No consolidative airspace disease. No pleural effusions. No pneumothorax. No pulmonary nodule or mass noted. Pulmonary vasculature and the cardiomediastinal silhouette are within normal limits. Bilateral breast implants incidentally noted. IMPRESSION: No radiographic evidence of acute cardiopulmonary disease. Electronically Signed   By: Mauri Brooklyn.D.  On: 08/17/2016 17:04   Xr C-arm No Report  Result Date: 08/14/2016 Please see Notes or Procedures tab for imaging impression.    CBC  Recent Labs Lab 08/17/16 1454 08/18/16 0634 08/19/16 0547  WBC 19.0* 15.6* 10.7*  HGB 12.5 11.2* 11.3*  HCT 35.9* 33.3* 33.8*  PLT 207 179 197  MCV 80.1 80.8 81.8  MCH 27.9 27.2 27.4  MCHC 34.8 33.6 33.4  RDW 14.0 14.2 14.7  LYMPHSABS 0.8  --   --   MONOABS 1.5*  --   --   EOSABS 0.0  --   --   BASOSABS 0.0  --   --     Chemistries   Recent Labs Lab 08/17/16 1454 08/18/16 0634 08/19/16 0547  NA 128* 132* 136  K 3.2* 3.3* 4.0  CL 93* 104 108  CO2 21* 20* 18*   GLUCOSE 171* 119* 106*  BUN 24* 17 9  CREATININE 1.27* 0.96 0.87  CALCIUM 7.4* 7.2* 7.2*  MG  --  1.5*  --   AST 31  --  24  ALT 37  --  28  ALKPHOS 144*  --  140*  BILITOT 1.5*  --  0.8   ------------------------------------------------------------------------------------------------------------------ estimated creatinine clearance is 63.9 mL/min (by C-G formula based on SCr of 0.87 mg/dL). ------------------------------------------------------------------------------------------------------------------ No results for input(s): HGBA1C in the last 72 hours. ------------------------------------------------------------------------------------------------------------------ No results for input(s): CHOL, HDL, LDLCALC, TRIG, CHOLHDL, LDLDIRECT in the last 72 hours. ------------------------------------------------------------------------------------------------------------------ No results for input(s): TSH, T4TOTAL, T3FREE, THYROIDAB in the last 72 hours.  Invalid input(s): FREET3 ------------------------------------------------------------------------------------------------------------------ No results for input(s): VITAMINB12, FOLATE, FERRITIN, TIBC, IRON, RETICCTPCT in the last 72 hours.  Coagulation profile No results for input(s): INR, PROTIME in the last 168 hours.  No results for input(s): DDIMER in the last 72 hours.  Cardiac Enzymes  Recent Labs Lab 08/18/16 0035 08/18/16 0634 08/18/16 1221  TROPONINI <0.03 <0.03 <0.03   ------------------------------------------------------------------------------------------------------------------ Invalid input(s): POCBNP   CBG: No results for input(s): GLUCAP in the last 168 hours.     Studies: Ct Abdomen Pelvis Wo Contrast  Result Date: 08/17/2016 CLINICAL DATA:  71 year old female with history of persistent nausea, vomiting and diarrhea beginning 3 days ago with some associated shaking chills, sweats and fevers  (temperature to 101 degrees Fahrenheit). Generalize weakness and malaise. EXAM: CT ABDOMEN AND PELVIS WITHOUT CONTRAST TECHNIQUE: Multidetector CT imaging of the abdomen and pelvis was performed following the standard protocol without IV contrast. COMPARISON:  CT the abdomen and pelvis 11/12/2013. FINDINGS: Lower chest: Right-sided Bochdalek's hernia. Bilateral breast implants with extensive capsular calcification. Aortic atherosclerosis. 3 mm nodule in the right middle lobe (image 2 of series 4) is unchanged compared to 11/12/2013, considered benign. Hepatobiliary: No definite cystic or solid hepatic lesions are identified on today's noncontrast CT examination. Status post cholecystectomy. Pancreas: No pancreatic mass or peripancreatic inflammatory changes are noted on today's noncontrast CT examination. Spleen: Unremarkable. Adrenals/Urinary Tract: 2 mm nonobstructive calculus in the interpolar collecting system of the right kidney. No additional calculi are identified within the collecting system of the left kidney, along the well visualized portions of either ureter, or within the well visualized portions of the urinary bladder (much of the distal third of the ureters bilaterally and much of the urinary bladder is completely obscured by beam hardening artifact from patient's bilateral hip arthroplasties). There is some subtle soft tissue stranding throughout the right perinephric fat. No hydroureteronephrosis. Partially exophytic 1 cm intermediate attenuation (34 HU) lesion in the interpolar region of the  left kidney is incompletely characterized on today's noncontrast CT examination, but is statistically likely a proteinaceous/hemorrhagic cyst. Other 10 mm low-attenuation lesion in the lower pole of the left kidney is also incompletely characterized, but likely a small cyst. Both of these lesions appear stable compared to 2015. Bilateral adrenal glands are normal in appearance. Stomach/Bowel: Unenhanced  appearance of the stomach is normal. There is no pathologic dilatation of small bowel or colon. Several colonic diverticulae are noted, particularly in the sigmoid colon, without surrounding inflammatory changes to suggest an acute diverticulitis at this time. Normal appendix. Vascular/Lymphatic: Aortic atherosclerosis, without definite aneurysm in the abdominal or pelvic vasculature. No lymphadenopathy noted in the abdomen or pelvis. Reproductive: Beam hardening artifact obscures the low anatomic pelvis. Probable hysterectomy. Ovaries are not confidently identified may be surgically absent or atrophic. Other: No significant volume of ascites.  No pneumoperitoneum. Musculoskeletal: Numerous sclerotic lesions are noted throughout the visualized axial and appendicular skeleton, concerning for metastatic disease, however, these appear relatively stable compared to the prior examination from 2015. IMPRESSION: 1. Soft tissue stranding in the perinephric fat adjacent to the right kidney. This could be related to a recently passed stone, or could be indicative of an acute process such as pyelonephritis. Clinical correlation is recommended. 2. 2 mm nonobstructive calculus in the interpolar collecting system of the right kidney. No definite ureteral calculi are noted at this time. 3. Colonic diverticulosis without evidence of acute diverticulitis at this time. 4. Aortic atherosclerosis. 5. Widespread sclerotic lesions throughout the visualized axial and appendicular skeleton. This appearance is concerning for metastatic disease to the bones, however, the stability over time compared to prior study from 2015 favors a benign etiology (unless these are old treated metastatic lesions). 6. Additional incidental findings, as above. Electronically Signed   By: Vinnie Langton M.D.   On: 08/17/2016 16:56   Dg Chest 2 View  Result Date: 08/17/2016 CLINICAL DATA:  71 year old female with history of low blood pressure and chest  pain 3 nights ago which has now resolved. Nausea, chills and fever. EXAM: CHEST  2 VIEW COMPARISON:  Chest x-ray 12/22/2015. FINDINGS: Lung volumes are normal. No consolidative airspace disease. No pleural effusions. No pneumothorax. No pulmonary nodule or mass noted. Pulmonary vasculature and the cardiomediastinal silhouette are within normal limits. Bilateral breast implants incidentally noted. IMPRESSION: No radiographic evidence of acute cardiopulmonary disease. Electronically Signed   By: Vinnie Langton M.D.   On: 08/17/2016 17:04      Lab Results  Component Value Date   HGBA1C 5.1 08/17/2012   Lab Results  Component Value Date   CREATININE 0.87 08/19/2016       Scheduled Meds: . aspirin  325 mg Oral Daily  . cefTRIAXone (ROCEPHIN)  IV  2 g Intravenous Q24H  . enoxaparin (LOVENOX) injection  40 mg Subcutaneous Daily  . metoprolol succinate  25 mg Oral QHS  . pantoprazole  40 mg Oral BID  . potassium chloride SA  20 mEq Oral Daily   Continuous Infusions: . 0.9 % NaCl with KCl 40 mEq / L 75 mL/hr (08/19/16 0802)     LOS: 2 days    Time spent: >30 MINS    [facility]  [facility] Pager [phone]. If 7PM-7AM, please contact night-coverage at [email or web address], password [facility] 08/19/2016, 9:06 AM  LOS: 2 days

## 2016-08-20 ENCOUNTER — Inpatient Hospital Stay (HOSPITAL_COMMUNITY): Payer: Medicare Other

## 2016-08-20 DIAGNOSIS — R0602 Shortness of breath: Secondary | ICD-10-CM

## 2016-08-20 LAB — URINE CULTURE: Culture: >=100000 — AB

## 2016-08-20 MED ORDER — AMPICILLIN 500 MG PO CAPS
500.0000 mg | ORAL_CAPSULE | Freq: Three times a day (TID) | ORAL | Status: DC
  Administered 2016-08-20 – 2016-08-21 (×3): 500 mg via ORAL
  Filled 2016-08-20 (×5): qty 1

## 2016-08-20 NOTE — Progress Notes (Signed)
[facility] PROGRESS NOTE  [name] UXL:[id number] DOB: 05/15/1946 DOA: 08/17/2016   PCP: Reginia Naas, MD     Assessment/Plan: Principal Problem:   Sepsis ([place]) Active Problems:   Ectopic atrial rhythm   Acute pyelonephritis   Chest pressure   Essential hypertension   Hyponatremia   Diastolic dysfunction   AKI (acute kidney injury) ([place])   71 y.o.femalewith medical history significant oftubular sclerosis, epilepsy, nephrolithiasis,andGERD; who presents with complaints of nausea,vomiting, and diarrhea.Upon admission into the emergency department patient was seen to be febrile up to 101.56F, pulse 101-135, respirations up to 30, blood pressure is close 97/49, and O2 saturations maintain a room air. Labs revealed WBC 19, Urinalysis was positive for signs of infection. A CT scan was obtained which showed perinephric stranding around the right kidney, nonobstructive renal calculi, multiple suspicious bone lesions(noted to be stable from previous CT in 2015), and diverticulosis without diverticulitis. Sepsis protocol was initiated  and patient admitted for possible UTI with sepsis  Assessment and plan Sepsis 2/2 right-sided pyelonephritis: Acute. Patient presents with abnormal UA and CT abd showed right perinephric stranding.  nephrolithiasis without signs of acute obstruction, CT suggestive of may have passed a kidney stone. Continue telemetry bed Urine culture shows Escherichia coli, blood culture no growth so far. Urine culture sensitivity pending - Continue empiric antibiotics of ceftriaxone per pharmacy - Tylenol prn fever Continue IV antibiotics until patient's symptoms of nausea resolves, until she is able to take medications PO   IV fluids discontinued due to shortness of breath  Nausea,vomiting, and diarrhea: Acute. Question possibility of symptoms secondary to pyelonephritis versus gastroenteritis.Diarrhea stopped over the last 24 hours GI  pathogen panel if  diarrhea recurs   Chest pressure: Initial chest x-ray was clear, troponin negative,and EKG showed no significant ischemic changes. - Trend troponins - Consider need of further workup,if symptoms persist  Acute kidney injury: Baseline creatinine previously noted to be 0.4-0.7, however patient presents with BUN 24 and creatinine 1.27. Likely prerenal. Hold Lasix , due to low initial blood pressure. Blood pressure parameters now improved - Follow-up repeat BMP in a.m.  Essentialhypertension/ atrial ectopic rhythm - Continue metoprolol  Chronic Diastolic dysfunction without exacerbation: Last echocardiogram showing EF of 65-70% with grade 1 diastolic dysfunction in 10/3662. - strict I&Os - Daily weights  Hypokalemia and hypocalcemia/hypomagnesemia: Potassium 3.2 and corrected calcium 8.2 on admission. Replete electrolytes  Sciatica: Patient status post steroid injection the lumbar spine 3 days ago. - Continue Robaxin prn muscle spasmand hydrocodone prn pain no evidence of deep or superficial vein thrombosis involving the right and left lower extremities.  Hyponatremia: Sodium 128 on admission secondary to dehydration.  Now 132>136  H/O tubular sclerosis  GERD - Pharmacy substitution of Protonix  DVT prophylaxsis lovenox  Code Status:  Full code     Family Communication: Discussed in detail with the patient,/daughter-in-law, on all imaging results, lab results explained to the patient   Disposition Plan:  Anticipate discharge tomorrow, urine culture sensitivity pending     Consultants:  None   Procedures:  None   Antibiotics: Anti-infectives    Start     Dose/Rate Route Frequency Ordered Stop   08/18/16 1800  cefTRIAXone (ROCEPHIN) 1 g in dextrose 5 % 50 mL IVPB  Status:  Discontinued     1 g 100 mL/hr over 30 Minutes Intravenous Every 24 hours 08/17/16 2256 08/18/16 1021   08/18/16 1800  cefTRIAXone (ROCEPHIN) 2 g in dextrose 5 % 50  mL IVPB     2 g 100 mL/hr over 30 Minutes Intravenous Every 24 hours 08/18/16 1021     08/18/16 0303  valACYclovir (VALTREX) tablet 1,000 mg     1,000 mg Oral 3 times daily PRN 08/18/16 0304     08/17/16 1745  cefTRIAXone (ROCEPHIN) 1 g in dextrose 5 % 50 mL IVPB     1 g 100 mL/hr over 30 Minutes Intravenous  Once 08/17/16 1733 08/17/16 1925         HPI/Subjective:  No fever , feels well , but still has right flank pain  Objective: Vitals:   08/19/16 0451 08/19/16 1704 08/19/16 2100 08/20/16 0459  BP: (!) 111/50 120/82 134/65 (!) 100/51  Pulse: 93 62 97 100  Resp: 13 16 18 20   Temp: 97.9 F (36.6 C) 98.4 F (36.9 C) 98.4 F (36.9 C) 97.9 F (36.6 C)  TempSrc:  Oral Oral Oral  SpO2: 100% 96% 100% 100%  Weight:   92.3 kg (203 lb 7.8 oz)   Height:        Intake/Output Summary (Last 24 hours) at 08/20/16 0900 Last data filed at 08/20/16 0700  Gross per 24 hour  Intake              747 ml  Output             1850 ml  Net            -1103 ml    Exam:  Examination:  General exam: Appears calm and comfortable  Respiratory system: Clear to auscultation. Respiratory effort normal. Cardiovascular system: S1 & S2 heard, RRR. No JVD, murmurs, rubs, gallops or clicks. No pedal edema. Gastrointestinal system: Abdomen is nondistended, soft and nontender. No organomegaly or masses felt. Normal bowel sounds heard. Central nervous system: Alert and oriented. No focal neurological deficits. Extremities: Symmetric 5 x 5 power. Skin: No rashes, lesions or ulcers Psychiatry: Judgement and insight appear normal. Mood & affect appropriate.     Data Reviewed: I have personally reviewed following labs and imaging studies  Micro Results Recent Results (from the past 240 hour(s))  Urine culture     Status: Abnormal (Preliminary result)   Collection Time: 08/17/16  5:20 PM  Result Value Ref Range Status   Specimen Description URINE, RANDOM  Final   Special Requests NONE  Final    Culture (A)  Final    >=100,000 COLONIES/mL ESCHERICHIA COLI SUSCEPTIBILITIES TO FOLLOW Performed at [facility], [address], [place], [place] [address]    Report Status PENDING  Incomplete  Culture, blood (routine x 2)     Status: None (Preliminary result)   Collection Time: 08/17/16  5:50 PM  Result Value Ref Range Status   Specimen Description BLOOD RIGHT HAND  Final   Special Requests BOTTLES DRAWN AEROBIC AND ANAEROBIC 5ML EACH  Final   Culture   Final    NO GROWTH 2 DAYS Performed at [facility], [place] [address], [place], [place] [address]    Report Status PENDING  Incomplete  Culture, blood (routine x 2)     Status: None (Preliminary result)   Collection Time: 08/17/16  6:00 PM  Result Value Ref Range Status   Specimen Description BLOOD LEFT ANTECUBITAL  Final   Special Requests BOTTLES DRAWN AEROBIC AND ANAEROBIC 5ML EACH  Final   Culture   Final    NO GROWTH 2 DAYS Performed at [facility], [place] [address],  [place], [place] [address]    Report Status PENDING  Incomplete    Radiology Reports Ct Abdomen Pelvis Wo Contrast  Result Date: 08/17/2016 CLINICAL DATA:  71 year old female with history of persistent nausea, vomiting and diarrhea beginning 3 days ago with some associated shaking chills, sweats and fevers (temperature to 101 degrees Fahrenheit). Generalize weakness and malaise. EXAM: CT ABDOMEN AND PELVIS WITHOUT CONTRAST TECHNIQUE: Multidetector CT imaging of the abdomen and pelvis was performed following the standard protocol without IV contrast. COMPARISON:  CT the abdomen and pelvis 11/12/2013. FINDINGS: Lower chest: Right-sided Bochdalek's hernia. Bilateral breast implants with extensive capsular calcification. Aortic atherosclerosis. 3 mm nodule in the right middle lobe (image 2 of series 4) is unchanged compared to 11/12/2013, considered benign. Hepatobiliary: No definite cystic or solid hepatic lesions are identified on today's noncontrast CT  examination. Status post cholecystectomy. Pancreas: No pancreatic mass or peripancreatic inflammatory changes are noted on today's noncontrast CT examination. Spleen: Unremarkable. Adrenals/Urinary Tract: 2 mm nonobstructive calculus in the interpolar collecting system of the right kidney. No additional calculi are identified within the collecting system of the left kidney, along the well visualized portions of either ureter, or within the well visualized portions of the urinary bladder (much of the distal third of the ureters bilaterally and much of the urinary bladder is completely obscured by beam hardening artifact from patient's bilateral hip arthroplasties). There is some subtle soft tissue stranding throughout the right perinephric fat. No hydroureteronephrosis. Partially exophytic 1 cm intermediate attenuation (34 HU) lesion in the interpolar region of the left kidney is incompletely characterized on today's noncontrast CT examination, but is statistically likely a proteinaceous/hemorrhagic cyst. Other 10 mm low-attenuation lesion in the lower pole of the left kidney is also incompletely characterized, but likely a small cyst. Both of these lesions appear stable compared to 2015. Bilateral adrenal glands are normal in appearance. Stomach/Bowel: Unenhanced appearance of the stomach is normal. There is no pathologic dilatation of small bowel or colon. Several colonic diverticulae are noted, particularly in the sigmoid colon, without surrounding inflammatory changes to suggest an acute diverticulitis at this time. Normal appendix. Vascular/Lymphatic: Aortic atherosclerosis, without definite aneurysm in the abdominal or pelvic vasculature. No lymphadenopathy noted in the abdomen or pelvis. Reproductive: Beam hardening artifact obscures the low anatomic pelvis. Probable hysterectomy. Ovaries are not confidently identified may be surgically absent or atrophic. Other: No significant volume of ascites.  No  pneumoperitoneum. Musculoskeletal: Numerous sclerotic lesions are noted throughout the visualized axial and appendicular skeleton, concerning for metastatic disease, however, these appear relatively stable compared to the prior examination from 2015. IMPRESSION: 1. Soft tissue stranding in the perinephric fat adjacent to the right kidney. This could be related to a recently passed stone, or could be indicative of an acute process such as pyelonephritis. Clinical correlation is recommended. 2. 2 mm nonobstructive calculus in the interpolar collecting system of the right kidney. No definite ureteral calculi are noted at this time. 3. Colonic diverticulosis without evidence of acute diverticulitis at this time. 4. Aortic atherosclerosis. 5. Widespread sclerotic lesions throughout the visualized axial and appendicular skeleton. This appearance is concerning for metastatic disease to the bones, however, the stability over time compared to prior study from 2015 favors a benign etiology (unless these are old treated metastatic lesions). 6. Additional incidental findings, as above. Electronically Signed   By: Vinnie Langton M.D.   On: 08/17/2016 16:56   Dg Chest 2 View  Result Date: 08/17/2016 CLINICAL DATA:  71 year old female with history of low  blood pressure and chest pain 3 nights ago which has now resolved. Nausea, chills and fever. EXAM: CHEST  2 VIEW COMPARISON:  Chest x-ray 12/22/2015. FINDINGS: Lung volumes are normal. No consolidative airspace disease. No pleural effusions. No pneumothorax. No pulmonary nodule or mass noted. Pulmonary vasculature and the cardiomediastinal silhouette are within normal limits. Bilateral breast implants incidentally noted. IMPRESSION: No radiographic evidence of acute cardiopulmonary disease. Electronically Signed   By: Vinnie Langton M.D.   On: 08/17/2016 17:04   Xr C-arm No Report  Result Date: 08/14/2016 Please see Notes or Procedures tab for imaging impression.     CBC  Recent Labs Lab 08/17/16 1454 08/18/16 0634 08/19/16 0547  WBC 19.0* 15.6* 10.7*  HGB 12.5 11.2* 11.3*  HCT 35.9* 33.3* 33.8*  PLT 207 179 197  MCV 80.1 80.8 81.8  MCH 27.9 27.2 27.4  MCHC 34.8 33.6 33.4  RDW 14.0 14.2 14.7  LYMPHSABS 0.8  --   --   MONOABS 1.5*  --   --   EOSABS 0.0  --   --   BASOSABS 0.0  --   --     Chemistries   Recent Labs Lab 08/17/16 1454 08/18/16 0634 08/19/16 0547 08/19/16 0900  NA 128* 132* 136  --   K 3.2* 3.3* 4.0  --   CL 93* 104 108  --   CO2 21* 20* 18*  --   GLUCOSE 171* 119* 106*  --   BUN 24* 17 9  --   CREATININE 1.27* 0.96 0.87  --   CALCIUM 7.4* 7.2* 7.2*  --   MG  --  1.5*  --  2.3  AST 31  --  24  --   ALT 37  --  28  --   ALKPHOS 144*  --  140*  --   BILITOT 1.5*  --  0.8  --    ------------------------------------------------------------------------------------------------------------------ estimated creatinine clearance is 64.6 mL/min (by C-G formula based on SCr of 0.87 mg/dL). ------------------------------------------------------------------------------------------------------------------ No results for input(s): HGBA1C in the last 72 hours. ------------------------------------------------------------------------------------------------------------------ No results for input(s): CHOL, HDL, LDLCALC, TRIG, CHOLHDL, LDLDIRECT in the last 72 hours. ------------------------------------------------------------------------------------------------------------------ No results for input(s): TSH, T4TOTAL, T3FREE, THYROIDAB in the last 72 hours.  Invalid input(s): FREET3 ------------------------------------------------------------------------------------------------------------------ No results for input(s): VITAMINB12, FOLATE, FERRITIN, TIBC, IRON, RETICCTPCT in the last 72 hours.  Coagulation profile No results for input(s): INR, PROTIME in the last 168 hours.  No results for input(s): DDIMER in the last 72  hours.  Cardiac Enzymes  Recent Labs Lab 08/18/16 0035 08/18/16 0634 08/18/16 1221  TROPONINI <0.03 <0.03 <0.03   ------------------------------------------------------------------------------------------------------------------ Invalid input(s): POCBNP   CBG: No results for input(s): GLUCAP in the last 168 hours.     Studies: No results found.    Lab Results  Component Value Date   HGBA1C 5.1 08/17/2012   Lab Results  Component Value Date   CREATININE 0.87 08/19/2016       Scheduled Meds: . aspirin  325 mg Oral Daily  . cefTRIAXone (ROCEPHIN)  IV  2 g Intravenous Q24H  . enoxaparin (LOVENOX) injection  40 mg Subcutaneous Daily  . metoprolol succinate  25 mg Oral QHS  . pantoprazole  40 mg Oral BID  . potassium chloride SA  20 mEq Oral Daily   Continuous Infusions:    LOS: 3 days    Time spent: >30 MINS    [facility]  [facility] Pager [phone]. If 7PM-7AM, please contact night-coverage at [email or web address], password [facility] 08/20/2016,  9:00 AM  LOS: 3 days

## 2016-08-20 NOTE — Consult Note (Signed)
[facility] CM Primary Care Navigator  08/20/2016  [name] 1946/02/10 [id number]   Met with patient at the bedside to identify possible discharge needs. Patient reports having increased weakness, fever, nausea/ vomiting and diarrhea that had led to this admission.  Patient endorses Dr. Carol Ada with [facility] as the primary care provider.    Patient shared using [facility] to obtain medications without any problem.   Patient reports managing her own medications at home straight out of the containers with her own organizing system.   Patient is independent with self care and able to drive prior to admission. Her friend Thayer Headings- retired Therapist, sports) can provide transportation to her doctors' appointments when needed.  Patient lives alone and is the primary caregiver for herself as stated. She mentioned that daughter in-law is coming home from [place] tomorrow and will be able to assist with her care at home if needed.  Discharge plan is home per patient.  Patient voiced understanding to call primary care provider's office when she returns home, for a post discharge follow-up appointment within a week or sooner if needs arise. Patient letter provided as a reminder.  Patient denies any health management needs or concerns at this time.  For additional questions please contact:  Edwena Felty A. Kelsey Durflinger, BSN, RN-BC [facility] PRIMARY CARE Navigator Cell: [phone]

## 2016-08-20 NOTE — Progress Notes (Signed)
**  Preliminary report by tech**  Bilateral lower extremity venous duplex completed. There is no evidence of deep or superficial vein thrombosis involving the right and left lower extremities. All visualized vessels appear patent and compressible. There is no evidence of Baker's cysts bilaterally.  08/20/16 9:39 AM [name] RVT

## 2016-08-21 MED ORDER — METHOCARBAMOL 500 MG PO TABS: 500.0000 mg | ORAL_TABLET | Freq: Three times a day (TID) | ORAL | 0 refills | Status: DC | PRN

## 2016-08-21 MED ORDER — AMPICILLIN 500 MG PO CAPS: 500.0000 mg | ORAL_CAPSULE | Freq: Three times a day (TID) | ORAL | 0 refills | Status: AC

## 2016-08-21 MED ORDER — ONDANSETRON HCL 4 MG PO TABS: 4.0000 mg | ORAL_TABLET | Freq: Four times a day (QID) | ORAL | 0 refills | Status: DC | PRN

## 2016-08-21 MED ORDER — PROMETHAZINE HCL 12.5 MG PO TABS: 12.5000 mg | ORAL_TABLET | Freq: Four times a day (QID) | ORAL | 0 refills | Status: DC | PRN

## 2016-08-21 MED ORDER — KLOR-CON M20 20 MEQ PO TBCR: 20.0000 meq | EXTENDED_RELEASE_TABLET | Freq: Every day | ORAL | 0 refills | Status: AC

## 2016-08-21 NOTE — Care Management Important Message (Signed)
Important Message  Patient Details  Name: [name] MRN: [id number] Date of Birth: 09-07-1945   Medicare Important Message Given:  Yes    Annasofia Pohl [address] 08/21/2016, 12:25 PM

## 2016-08-21 NOTE — Progress Notes (Signed)
Pt discharge to home, discharge instructions, medications and follow up appointments discussed and reviewed with pt, verbalized understanding. Telemetry discontinued, CCMD notified. IV discontinued, cath intact, site clean and dry. Pt was escorted out of the unit in wheelchair, took all belongings with her.

## 2016-08-21 NOTE — Discharge Summary (Addendum)
Physician Discharge Summary  [name] MRN: [id number] DOB/AGE: 1945/06/21 71 y.o.  PCP: Reginia Naas, MD   Admit date: 08/17/2016 Discharge date: 08/21/2016  Discharge Diagnoses:    Principal Problem:   Sepsis [facility]) Active Problems:   Ectopic atrial rhythm   Acute pyelonephritis   Chest pressure   Essential hypertension   Hyponatremia   Diastolic dysfunction   AKI (acute kidney injury) ([place])    Follow-up recommendations Follow-up with PCP in 3-5 days , including all  additional recommended appointments as below Follow-up CBC, CMP in 3-5 days      Current Discharge Medication List    START taking these medications   Details  ampicillin (PRINCIPEN) 500 MG capsule Take 1 capsule (500 mg total) by mouth every 8 (eight) hours. Qty: 30 capsule, Refills: 0    promethazine (PHENERGAN) 12.5 MG tablet Take 1 tablet (12.5 mg total) by mouth every 6 (six) hours as needed for nausea or vomiting. Qty: 30 tablet, Refills: 0      CONTINUE these medications which have CHANGED   Details  KLOR-CON M20 20 MEQ tablet Take 1 tablet (20 mEq total) by mouth daily. Qty: 30 tablet, Refills: 0    methocarbamol (ROBAXIN) 500 MG tablet Take 1 tablet (500 mg total) by mouth every 8 (eight) hours as needed for muscle spasms. Qty: 30 tablet, Refills: 0      CONTINUE these medications which have NOT CHANGED   Details  acetaminophen (TYLENOL) 500 MG tablet Take 500 mg by mouth every 6 (six) hours as needed for mild pain or headache.    aspirin EC 325 MG EC tablet Take 1 tablet (325 mg total) by mouth 2 (two) times daily after a meal. Qty: 30 tablet, Refills: 0    cyclobenzaprine (FLEXERIL) 5 MG tablet Take 2 tablets (10 mg total) by mouth 3 (three) times daily as needed for muscle spasms. Qty: 15 tablet, Refills: 0    furosemide (LASIX) 20 MG tablet TAKE 1 TABLET BY MOUTH DAILY Qty: 90 tablet, Refills: 0    hydrOXYzine (VISTARIL) 50 MG capsule Take 1 PO by mouth 2 hours  prior to procedure, may repeat if needed. Qty: 20 capsule, Refills: 1    metoprolol succinate (TOPROL-XL) 25 MG 24 hr tablet TAKE 1 TABLET (25 MG TOTAL) BY MOUTH AT BEDTIME. Qty: 30 tablet, Refills: 6    omeprazole (PRILOSEC) 40 MG capsule Take 40 mg by mouth every morning.     cholecalciferol (VITAMIN D) 1000 UNITS tablet Take 4,000 Units by mouth every morning.     HYDROcodone-acetaminophen (NORCO/VICODIN) 5-325 MG tablet Take 1 tablet by mouth 3 (three) times daily as needed for moderate pain. Qty: 30 tablet, Refills: 0      STOP taking these medications     valACYclovir (VALTREX) 1000 MG tablet            Discharge Condition: *Stable   Discharge Instructions Get Medicines reviewed and adjusted: Please take all your medications with you for your next visit with your Primary MD  Please request your Primary MD to go over all hospital tests and procedure/radiological results at the follow up, please ask your Primary MD to get all Hospital records sent to his/her office.  If you experience worsening of your admission symptoms, develop shortness of breath, life threatening emergency, suicidal or homicidal thoughts you must seek medical attention immediately by calling 911 or calling your MD immediately if symptoms less severe.  You must read complete instructions/literature along with all the  possible adverse reactions/side effects for all the Medicines you take and that have been prescribed to you. Take any new Medicines after you have completely understood and accpet all the possible adverse reactions/side effects.   Do not drive when taking Pain medications.   Do not take more than prescribed Pain, Sleep and Anxiety Medications  Special Instructions: If you have smoked or chewed Tobacco in the last 2 yrs please stop smoking, stop any regular Alcohol and or any Recreational drug use.  Wear Seat belts while driving.  Please note  You were cared for by a hospitalist  during your hospital stay. Once you are discharged, your primary care physician will handle any further medical issues. Please note that NO REFILLS for any discharge medications will be authorized once you are discharged, as it is imperative that you return to your primary care physician (or establish a relationship with a primary care physician if you do not have one) for your aftercare needs so that they can reassess your need for medications and monitor your lab values.     Allergies  Allergen Reactions  . Other     All anti-depressants pt states makes her feel as if her body is shutting down.  Marland Kitchen Zoloft [Sertraline Hcl]     Pt can not take SSRI - nausea and rash   . Percocet [Oxycodone-Acetaminophen] Nausea And Vomiting  . Contrast Media [Iodinated Diagnostic Agents] Rash    Patient breaks out in hives      Disposition: 01-Home or Self Care   Consults:     Significant Diagnostic Studies:  Ct Abdomen Pelvis Wo Contrast  Result Date: 08/17/2016 CLINICAL DATA:  71 year old female with history of persistent nausea, vomiting and diarrhea beginning 3 days ago with some associated shaking chills, sweats and fevers (temperature to 101 degrees Fahrenheit). Generalize weakness and malaise. EXAM: CT ABDOMEN AND PELVIS WITHOUT CONTRAST TECHNIQUE: Multidetector CT imaging of the abdomen and pelvis was performed following the standard protocol without IV contrast. COMPARISON:  CT the abdomen and pelvis 11/12/2013. FINDINGS: Lower chest: Right-sided Bochdalek's hernia. Bilateral breast implants with extensive capsular calcification. Aortic atherosclerosis. 3 mm nodule in the right middle lobe (image 2 of series 4) is unchanged compared to 11/12/2013, considered benign. Hepatobiliary: No definite cystic or solid hepatic lesions are identified on today's noncontrast CT examination. Status post cholecystectomy. Pancreas: No pancreatic mass or peripancreatic inflammatory changes are noted on today's  noncontrast CT examination. Spleen: Unremarkable. Adrenals/Urinary Tract: 2 mm nonobstructive calculus in the interpolar collecting system of the right kidney. No additional calculi are identified within the collecting system of the left kidney, along the well visualized portions of either ureter, or within the well visualized portions of the urinary bladder (much of the distal third of the ureters bilaterally and much of the urinary bladder is completely obscured by beam hardening artifact from patient's bilateral hip arthroplasties). There is some subtle soft tissue stranding throughout the right perinephric fat. No hydroureteronephrosis. Partially exophytic 1 cm intermediate attenuation (34 HU) lesion in the interpolar region of the left kidney is incompletely characterized on today's noncontrast CT examination, but is statistically likely a proteinaceous/hemorrhagic cyst. Other 10 mm low-attenuation lesion in the lower pole of the left kidney is also incompletely characterized, but likely a small cyst. Both of these lesions appear stable compared to 2015. Bilateral adrenal glands are normal in appearance. Stomach/Bowel: Unenhanced appearance of the stomach is normal. There is no pathologic dilatation of small bowel or colon. Several colonic diverticulae are noted,  particularly in the sigmoid colon, without surrounding inflammatory changes to suggest an acute diverticulitis at this time. Normal appendix. Vascular/Lymphatic: Aortic atherosclerosis, without definite aneurysm in the abdominal or pelvic vasculature. No lymphadenopathy noted in the abdomen or pelvis. Reproductive: Beam hardening artifact obscures the low anatomic pelvis. Probable hysterectomy. Ovaries are not confidently identified may be surgically absent or atrophic. Other: No significant volume of ascites.  No pneumoperitoneum. Musculoskeletal: Numerous sclerotic lesions are noted throughout the visualized axial and appendicular skeleton,  concerning for metastatic disease, however, these appear relatively stable compared to the prior examination from 2015. IMPRESSION: 1. Soft tissue stranding in the perinephric fat adjacent to the right kidney. This could be related to a recently passed stone, or could be indicative of an acute process such as pyelonephritis. Clinical correlation is recommended. 2. 2 mm nonobstructive calculus in the interpolar collecting system of the right kidney. No definite ureteral calculi are noted at this time. 3. Colonic diverticulosis without evidence of acute diverticulitis at this time. 4. Aortic atherosclerosis. 5. Widespread sclerotic lesions throughout the visualized axial and appendicular skeleton. This appearance is concerning for metastatic disease to the bones, however, the stability over time compared to prior study from 2015 favors a benign etiology (unless these are old treated metastatic lesions). 6. Additional incidental findings, as above. Electronically Signed   By: Vinnie Langton M.D.   On: 08/17/2016 16:56   Dg Chest 2 View  Result Date: 08/17/2016 CLINICAL DATA:  71 year old female with history of low blood pressure and chest pain 3 nights ago which has now resolved. Nausea, chills and fever. EXAM: CHEST  2 VIEW COMPARISON:  Chest x-ray 12/22/2015. FINDINGS: Lung volumes are normal. No consolidative airspace disease. No pleural effusions. No pneumothorax. No pulmonary nodule or mass noted. Pulmonary vasculature and the cardiomediastinal silhouette are within normal limits. Bilateral breast implants incidentally noted. IMPRESSION: No radiographic evidence of acute cardiopulmonary disease. Electronically Signed   By: Vinnie Langton M.D.   On: 08/17/2016 17:04   Xr C-arm No Report  Result Date: 08/14/2016 Please see Notes or Procedures tab for imaging impression.      Filed Weights   08/19/16 0126 08/19/16 2100 08/20/16 2039  Weight: 90.7 kg (199 lb 15.3 oz) 92.3 kg (203 lb 7.8 oz) 91.9 kg  (202 lb 11.2 oz)     Microbiology: Recent Results (from the past 240 hour(s))  Urine culture     Status: Abnormal   Collection Time: 08/17/16  5:20 PM  Result Value Ref Range Status   Specimen Description URINE, RANDOM  Final   Special Requests NONE  Final   Culture >=100,000 COLONIES/mL ESCHERICHIA COLI (A)  Final   Report Status 08/20/2016 FINAL  Final   Organism ID, Bacteria ESCHERICHIA COLI (A)  Final      Susceptibility   Escherichia coli - MIC*    AMPICILLIN <=2 SENSITIVE Sensitive     CEFAZOLIN <=4 SENSITIVE Sensitive     CEFTRIAXONE <=1 SENSITIVE Sensitive     CIPROFLOXACIN <=0.25 SENSITIVE Sensitive     GENTAMICIN <=1 SENSITIVE Sensitive     IMIPENEM <=0.25 SENSITIVE Sensitive     NITROFURANTOIN <=16 SENSITIVE Sensitive     TRIMETH/SULFA <=20 SENSITIVE Sensitive     AMPICILLIN/SULBACTAM <=2 SENSITIVE Sensitive     PIP/TAZO <=4 SENSITIVE Sensitive     Extended ESBL NEGATIVE Sensitive     * >=100,000 COLONIES/mL ESCHERICHIA COLI  Culture, blood (routine x 2)     Status: None (Preliminary result)   Collection Time: 08/17/16  5:50 PM  Result Value Ref Range Status   Specimen Description BLOOD RIGHT HAND  Final   Special Requests BOTTLES DRAWN AEROBIC AND ANAEROBIC 5ML EACH  Final   Culture   Final    NO GROWTH 4 DAYS Performed at [facility], [address] [place], [place] [address]    Report Status PENDING  Incomplete  Culture, blood (routine x 2)     Status: None (Preliminary result)   Collection Time: 08/17/16  6:00 PM  Result Value Ref Range Status   Specimen Description BLOOD LEFT ANTECUBITAL  Final   Special Requests BOTTLES DRAWN AEROBIC AND ANAEROBIC 5ML EACH  Final   Culture   Final    NO GROWTH 4 DAYS Performed at [facility], [place] [address] [place], [place] [address]    Report Status PENDING  Incomplete       Blood Culture    Component Value Date/Time   SDES BLOOD LEFT ANTECUBITAL 08/17/2016 1800   SPECREQUEST BOTTLES DRAWN  AEROBIC AND ANAEROBIC 5ML EACH 08/17/2016 1800   CULT  08/17/2016 1800    NO GROWTH 4 DAYS Performed at [facility], [place] [address] [place], [place] [address]    REPTSTATUS PENDING 08/17/2016 1800        HPI    71 y.o.femalewith medical history significant oftubular sclerosis, epilepsy, nephrolithiasis,andGERD; who presents with complaints of nausea,vomiting, and diarrhea.Upon admission into the emergency department patient was seen to be febrile up to 101.48F, pulse 101-135, respirations up to 30, blood pressure is close 97/49, and O2 saturations maintain a room air. Labs revealed WBC 19, Urinalysis was positive for signs of infection. A CT scan was obtained which showed perinephric stranding around the right kidney, nonobstructive renal calculi, multiple suspicious bone lesions(noted to be stable from previous CT in 2015), and diverticulosis without diverticulitis. Sepsis protocol was initiated  and patient admitted for possible UTI with sepsis  HOSPITAL COURSE:  Sepsis 2/2 right-sided pyelonephritis:  Acute. Patient presents with abnormal UA and CT abd showed right perinephric stranding.  nephrolithiasis without signs of acute obstruction, CT suggestive of may have passed a kidney stone. Urine culture shows pansensitive  Escherichia coli, blood culture no growth so far.  Antibiotic changed from Rocephin to amoxicillin for another 10 days      Nausea,vomiting, and diarrhea: Acute. Likely related to #1 pyelonephritis versus gastroenteritis.Diarrhea stopped over the last 48 hours    Chest pressure: Initial chest x-ray was clear, troponin negative,and EKG showed no significant ischemic changes. Negative troponins    Acute kidney injury:Baseline creatinine previously noted to be 0.4-0.7, however patient presents with BUN 24 and creatinine 1.27. Likely prerenal. Held Lasix , due to low initial blood pressure. Blood pressure parameters now improved  Creatinine on  back to baseline of 0.87  Essentialhypertension/ atrial ectopic rhythm - Continue metoprolol  Chronic Diastolic dysfunction without exacerbation: Last echocardiogram showing EF of 65-70% with grade 1 diastolic dysfunction in 08/8099. Resume Lasix  Hypokalemia and hypocalcemia/hypomagnesemia: Potassium 3.2 and corrected calcium 8.2 on admission. Replete electrolytes, patient provided with a prescription for potassium 20  meq a day  Sciatica: Patient status post steroid injection the lumbar spine 3 days ago. - Continue Robaxin prn muscle spasmand hydrocodone prn pain no evidence of deep or superficial vein thrombosis involving the right and left lower extremities.  Hyponatremia: Sodium 128 on admissionsecondary to dehydration. Now 132>136  H/O  severe osteoporosis-likely contributing to bony sclerosis seen on CT, PCP to assess for  the  GERD - Pharmacy substitution of Protonix    Discharge Exam:   Blood pressure 115/74, pulse 71, temperature 98 F (36.7 C), temperature source Oral, resp. rate 18, height 5\' 2"  (1.575 m), weight 91.9 kg (202 lb 11.2 oz), SpO2 100 %.  General exam: Appears calm and comfortable  Respiratory system: Clear to auscultation. Respiratory effort normal. Cardiovascular system: S1 & S2 heard, RRR. No JVD, murmurs, rubs, gallops or clicks. No pedal edema. Gastrointestinal system: Abdomen is nondistended, soft and nontender. No organomegaly or masses felt. Normal bowel sounds heard. Central nervous system: Alert and oriented. No focal neurological deficits. Extremities: Symmetric 5 x 5 power. Skin: No rashes, lesions or ulcers Psychiatry: Judgement and insight appear normal. Mood & affect appropriate.      Follow-up Information    Reginia Naas, MD. Call.   Specialty:  Family Medicine Why:   PCP to make appoitment, for hospital follow-up, in 3-5 days Contact information: [place]  [address] [phone]           Signed: Reyne Dumas 08/21/2016, 10:59 AM        Time spent >45 mins

## 2016-08-22 LAB — CULTURE, BLOOD (ROUTINE X 2)
CULTURE: NO GROWTH
Culture: NO GROWTH

## 2016-08-26 ENCOUNTER — Telehealth (INDEPENDENT_AMBULATORY_CARE_PROVIDER_SITE_OTHER): Payer: Self-pay | Admitting: *Deleted

## 2016-08-26 DIAGNOSIS — B009 Herpesviral infection, unspecified: Secondary | ICD-10-CM | POA: Diagnosis not present

## 2016-08-26 DIAGNOSIS — A4151 Sepsis due to Escherichia coli [E. coli]: Secondary | ICD-10-CM | POA: Diagnosis not present

## 2016-08-26 DIAGNOSIS — M25561 Pain in right knee: Secondary | ICD-10-CM | POA: Diagnosis not present

## 2016-08-26 DIAGNOSIS — M25562 Pain in left knee: Secondary | ICD-10-CM | POA: Diagnosis not present

## 2016-08-26 NOTE — Telephone Encounter (Signed)
Patient came in this afternoon in regards to being in the hospital recently. She wasn't sure if she needed to reschedule her surgery or not for April 19th? She had Sepsis and was just released from the hospital.

## 2016-08-26 NOTE — Telephone Encounter (Signed)
Please advise 

## 2016-08-27 NOTE — Telephone Encounter (Signed)
Since it is just a shoulder arthroscopy, we can still safely proceed with surgery if she still wants to.  I'm ok with delaying surgery as well.  She can wait until closer to surgery to cancel if she wants to.

## 2016-08-27 NOTE — Telephone Encounter (Signed)
Patient aware of the below message and will cancel if she decides

## 2016-09-19 ENCOUNTER — Ambulatory Visit (INDEPENDENT_AMBULATORY_CARE_PROVIDER_SITE_OTHER): Payer: Medicare Other | Admitting: Physician Assistant

## 2016-09-19 ENCOUNTER — Encounter (INDEPENDENT_AMBULATORY_CARE_PROVIDER_SITE_OTHER): Payer: Self-pay | Admitting: Physician Assistant

## 2016-09-19 DIAGNOSIS — M25561 Pain in right knee: Secondary | ICD-10-CM

## 2016-09-19 DIAGNOSIS — M25562 Pain in left knee: Secondary | ICD-10-CM | POA: Diagnosis not present

## 2016-09-19 MED ORDER — HYDROCODONE-ACETAMINOPHEN 5-325 MG PO TABS: 1.0000 | ORAL_TABLET | Freq: Three times a day (TID) | ORAL | 0 refills | Status: DC | PRN

## 2016-09-19 NOTE — Progress Notes (Signed)
Office Visit Note   Patient: [name]           Date of Birth: 1945-11-21           MRN: [id number] Visit Date: 09/19/2016              Requested by: Samantha Ada, MD [address] [place], [place] [address] PCP: Samantha Naas, MD   Assessment & Plan: Visit Diagnoses:  1. Pain in both knees, unspecified chronicity     Plan: At this time we will attain a MRI of her left knee to evaluate the cartilage and also evaluate for possible meniscal tear. Again radiographs of her left  Knee have shown ossification of her meniscus. Otherwise the lateral ,medial, and patellofemoral joint lines are well preserved. She has undergone both cortisone injections and supplemental injections in the left knee with minimal relief. Due to her renal failure which was acute and her recent sepsis would not give her any NSAIDs or cortisone injections in either knee.  Follow-Up Instructions: Return for After MRI.   Orders:  No orders of the defined types were placed in this encounter.  Meds ordered this encounter  Medications  . HYDROcodone-acetaminophen (NORCO/VICODIN) 5-325 MG tablet    Sig: Take 1 tablet by mouth 3 (three) times daily as needed for moderate pain.    Dispense:  30 tablet    Refill:  0      Procedures: No procedures performed   Clinical Data: No additional findings.   Subjective: Chief Complaint  Patient presents with  . Right Knee - Pain  . Left Knee - Pain    HPI [name] returns today complaining of left greater than right knee pain. Left knee is giving way. Some giving way of the right knee. She states she has constant aching in the left knee. She has tried some Aleve. However she was recently admitted to the hospital for sepsis and pyelonephritis possible kidney stone. While hospitalized she developed an acute renal failure. She's been asked not to take NSAIDs. Review of Systems Please see history of present illness Generalized fatigue  and muscle aches. No shortness breath or chest pain  Objective: Vital Signs: There were no vitals taken for this visit.  Physical Exam  Constitutional: She is oriented to person, place, and time. She appears well-developed and well-nourished. No distress.  Neurological: She is alert and oriented to person, place, and time.  Psychiatric: She has a normal mood and affect.    Ortho Exam Left knee no instability valgus varus stressing. She has full extension flexion beyond 90. Tenderness along the medial joint line. Positive McMurray's. No effusion erythema or edema or abnormal warmth left knee Specialty Comments:  No specialty comments available.  Imaging: No results found.   PMFS History: Patient Active Problem List   Diagnosis Date Noted  . Acute pyelonephritis 08/18/2016  . Chest pressure 08/18/2016  . Essential hypertension 08/18/2016  . Hyponatremia 08/18/2016  . Diastolic dysfunction 58/52/7782  . AKI (acute kidney injury) ([facility]) 08/18/2016  . Sepsis ([facility])   . Acute lower UTI   . Tachycardia   . Osteoarthritis of right hip 02/17/2015  . 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 Atrial tachycardia ([place]) 12/12/2014  . 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 Recurrent dislocation of hip 10/17/2014  . Dislocation of internal left hip prosthesis ([place]) 10/16/2014  . Ectopic atrial rhythm 10/12/2014  . Atrial bigeminy 10/12/2014  .  Osteoarthritis of left hip 09/16/2014  . Status post total replacement of left hip 09/16/2014  . ASCUS favoring benign 09/05/2014  . History of vitamin B deficiency 05/02/2014  . Acute bronchitis 04/18/2014  . Oral aphthous ulcer 04/18/2014  . Edema 12/21/2013  . Irregular heart beat 12/21/2013  . Anuria 11/12/2013  . History of cervical dysplasia 09/21/2013  . Rectocele, female 09/21/2013  . Kidney stone 09/21/2013  . Rash and nonspecific skin eruption 06/25/2013  . Obesity (BMI 30-39.9) 06/16/2013    . IBS (irritable bowel syndrome) 12/22/2012  . Other sleep disturbances 10/27/2012  . Elevated BP 09/29/2012  . Weight gain 08/17/2012  . Osteopenia 08/17/2012  . Vulvar lesion 08/10/2012  . 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 Condyloma acuminatum in female 07/14/2012  . Shingles 07/14/2012  . Vitamin D deficiency 07/14/2012   Past Medical History:  Diagnosis Date  . Arthritis   . DDD (degenerative disc disease), lumbosacral   . Diverticulosis   . Falls    hx of   . GERD (gastroesophageal reflux disease)   . H/O hiatal hernia   . History of diverticulitis of colon   . History of epilepsy    childhood age 62 to 64  ---secondary to high calcium deposts of optic nerve---  none since age 40 with pregency  . History of gastric ulcer   . History of kidney stones   . History of shingles    MARCH 2014--  BACK AREA  . History of vulvar dysplasia    S/P  LASER ABLATION 2014  . IBS (irritable bowel syndrome)   . Mild obstructive sleep apnea    SLEEP STUDY  11-19-2012--  NO CPAP RX (DID NOT MEET CRITIRIA)  . Nodule of right lung    BENIGN--- AND STABLE PER  CT  11/2013  . Osteoporosis   . Renal cyst, left    STABLE PER CT 11/2013  . Seizures ([place])   . 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 Heart disease Mother   . Tuberous sclerosis Mother   . Uterine cancer Mother   . Tuberous sclerosis Maternal Grandmother   . Tuberous sclerosis Sister   . Tuberous sclerosis Daughter   . Tuberous sclerosis Son     2 sons  . Tuberous sclerosis Maternal Aunt   . Tuberous sclerosis Maternal Aunt   . Tuberous sclerosis Maternal Aunt   . Tuberous sclerosis Cousin     Past Surgical History:  Procedure Laterality Date  . ANTERIOR HIP REVISION Left 10/18/2014   Procedure: EXCHANGE LEFT HIP BALL OF DIRECT ANTERIOR TOTAL HIP ARTHROPLASTY;  Surgeon:  Mcarthur Rossetti, MD;  Location: [facility];  Service: Orthopedics;  Laterality: Left;  . AUGMENTATION MAMMAPLASTY     SALINE  . BLADDER SURGERY  1991   bladder reconstruction of torsion  . CARDIAC CATHETERIZATION  06-26-2001   NORMAL CORONARY ARTERIES  . CATARACT EXTRACTION W/ INTRAOCULAR LENS  IMPLANT, BILATERAL    . CHOLECYSTECTOMY  11-18-2003  . CO2 LASER APPLICATION N/A 11/05/7844   Procedure: CO2 LASER APPLICATION;  Surgeon: Terrance Mass, MD;  [facility];  Service: Gynecology;  Laterality: N/A;CO2 laser of vaginal and vulvar lesions  . COLONOSCOPY    . CYSTOSCOPY WITH RETROGRADE PYELOGRAM, URETEROSCOPY AND STENT PLACEMENT Right 11/22/2013   Procedure: CYSTOSCOPY WITH RETROGRADE PYELOGRAM, URETEROSCOPY AND  STENT PLACEMENT;  Surgeon: Sharyn Creamer, MD;  Location[facility];  Service: Urology;  Laterality: Right;  . CYSTOSCOPY/RETROGRADE/URETEROSCOPY Right 12/01/2013   Procedure: CYSTOSCOPY, RIGHT RETROGRADE/RIGHT DIGITAL URETEROSCOPY, RIGHT URETERAL STENT EXCHANGE;  Surgeon: Sharyn Creamer, MD;  Location: [facility];  Service: Urology;  Laterality: Right;  STAGED RIGHT URETEROSCOPY RIGHT URETER DILATION    . DILATION AND CURETTAGE OF UTERUS  multiple -- last one 1975  . GYNECOLOGIC CRYOSURGERY    . HEMORRHOID SURGERY  1970's  . HIP CLOSED REDUCTION Left 10/16/2014   Procedure: CLOSED MANIPULATION HIP;  Surgeon: Mcarthur Rossetti, MD;  Location: [facility];  Service: Orthopedics;  Laterality: Left;  . HOLMIUM LASER APPLICATION Right 06/11/2991   Procedure: HOLMIUM LASER APPLICATION;  Surgeon: Sharyn Creamer, MD;  Location[facility];  Service: Urology;  Laterality: Right;  . [place] SURGERY  1985  . ORIF RIGHT BIMALLEOLAR ANKLE FX  12-18-2003  . TOTAL HIP ARTHROPLASTY Left 09/16/2014   Procedure: LEFT TOTAL HIP ARTHROPLASTY ANTERIOR APPROACH;  Surgeon: Mcarthur Rossetti, MD;  Location: [facility];   Service: Orthopedics;  Laterality: Left;  . TOTAL HIP ARTHROPLASTY Right 02/17/2015   Procedure: RIGHT TOTAL HIP ARTHROPLASTY ANTERIOR APPROACH;  Surgeon: Mcarthur Rossetti, MD;  Location: [facility];  Service: Orthopedics;  Laterality: Right;  . UPPER GASTROINTESTINAL ENDOSCOPY    . VAGINAL HYSTERECTOMY  1975  . WIDE LOCAL EXCISION OF [place] AND PERINEUM  04-06-2009   VULVAR CARCINOMA IN SITU   Social History   Occupational History  . driver Other    [employer]   Social History Main Topics  . Smoking status: Former Smoker    Packs/day: 2.00    Years: 25.00    Types: Cigarettes    Quit date: 06/03/1998  . Smokeless tobacco: Never Used  . Alcohol use Yes     Comment: rarely has a glass of wine  . Drug use: No  . Sexual activity: Not Currently    Partners: Male

## 2016-09-26 ENCOUNTER — Inpatient Hospital Stay (INDEPENDENT_AMBULATORY_CARE_PROVIDER_SITE_OTHER): Payer: Self-pay | Admitting: Orthopaedic Surgery

## 2016-09-26 ENCOUNTER — Other Ambulatory Visit (INDEPENDENT_AMBULATORY_CARE_PROVIDER_SITE_OTHER): Payer: Self-pay

## 2016-09-26 ENCOUNTER — Telehealth (INDEPENDENT_AMBULATORY_CARE_PROVIDER_SITE_OTHER): Payer: Self-pay

## 2016-09-26 DIAGNOSIS — G8929 Other chronic pain: Secondary | ICD-10-CM

## 2016-09-26 DIAGNOSIS — M25562 Pain in left knee: Principal | ICD-10-CM

## 2016-09-26 NOTE — Telephone Encounter (Signed)
FYI-  Patient wanted to let Artis Delay know that she is still having left knee pain and the pain medicine helps a little. Patient hasn't had MRI scheduled yet.

## 2016-09-26 NOTE — Telephone Encounter (Signed)
Noted  

## 2016-10-06 ENCOUNTER — Ambulatory Visit
Admission: RE | Admit: 2016-10-06 | Discharge: 2016-10-06 | Disposition: A | Discharge status: Home / Self Care | Payer: Medicare Other | Source: Ambulatory Visit | Attending: Physician Assistant | Admitting: Physician Assistant

## 2016-10-06 DIAGNOSIS — G8929 Other chronic pain: Secondary | ICD-10-CM

## 2016-10-06 DIAGNOSIS — M25462 Effusion, left knee: Secondary | ICD-10-CM | POA: Diagnosis not present

## 2016-10-06 DIAGNOSIS — M25562 Pain in left knee: Principal | ICD-10-CM

## 2016-10-10 ENCOUNTER — Ambulatory Visit (INDEPENDENT_AMBULATORY_CARE_PROVIDER_SITE_OTHER): Payer: Medicare Other | Admitting: Orthopaedic Surgery

## 2016-10-10 DIAGNOSIS — M23322 Other meniscus derangements, posterior horn of medial meniscus, left knee: Secondary | ICD-10-CM

## 2016-10-10 NOTE — Progress Notes (Signed)
[name] is here for follow-up of MRI of her left knee. She did have a lot of swelling in her knee and pain with locking and catching. She is tried and failed conservative treatment measures including ice, heat, anti-inflammatories, steroid injections and quad training exercises. She is someone well-known to me. She's had bilateral hip replacements as well. She's been admitted with a cane as well. She's been out of work due to the pain in her knee. We sent her for an MRI after failure conservative treatment.  On examination of her knee she has medial lateral joint line tenderness and a positive Murray sign of the medial side there is a mild effusion as well. She is pain down the lateral aspect of her knee. The knee feels ligamentously stable. She does hyperextend no.  MRI shows a posterior horn medial meniscal tear. There is significant synovitis of the knee and mucoid degeneration of the anterior cruciate ligament and PCL. She has cartilage thinning throughout the knee but no areas of full-thickness cartilage loss at all. This is only mild.  Do this symptomatically meniscal tear and synovitis her knee I do feel that she'll benefit from an arthroscopic intervention. She understands as well and does wish proceed given her continued symptoms. I had a thorough discussion with her about surgery including discussion of the risks and benefits of this. She has wish to have this set up soon. See her back in one week postoperative for suture removal. She'll probably need to take an additional 1-2 weeks off from work.

## 2016-10-16 ENCOUNTER — Encounter: Payer: Self-pay | Admitting: Gynecology

## 2016-10-17 ENCOUNTER — Encounter: Payer: Self-pay | Admitting: Orthopaedic Surgery

## 2016-10-17 DIAGNOSIS — M94262 Chondromalacia, left knee: Secondary | ICD-10-CM | POA: Diagnosis not present

## 2016-10-17 DIAGNOSIS — M659 Synovitis and tenosynovitis, unspecified: Secondary | ICD-10-CM | POA: Diagnosis not present

## 2016-10-17 DIAGNOSIS — G8918 Other acute postprocedural pain: Secondary | ICD-10-CM | POA: Diagnosis not present

## 2016-10-17 DIAGNOSIS — M23322 Other meniscus derangements, posterior horn of medial meniscus, left knee: Secondary | ICD-10-CM | POA: Diagnosis not present

## 2016-10-17 DIAGNOSIS — M23222 Derangement of posterior horn of medial meniscus due to old tear or injury, left knee: Secondary | ICD-10-CM | POA: Diagnosis not present

## 2016-10-24 ENCOUNTER — Ambulatory Visit (INDEPENDENT_AMBULATORY_CARE_PROVIDER_SITE_OTHER): Payer: Medicare Other | Admitting: Orthopaedic Surgery

## 2016-10-24 DIAGNOSIS — Z9889 Other specified postprocedural states: Secondary | ICD-10-CM

## 2016-10-24 NOTE — Progress Notes (Signed)
The patient is following up 1 week after a left knee arthroscopy. We were able to find a posterior horn medial meniscal tear to the mid body. She has actually a good looking cartilage in her knee. There are significant synovitis in the suprapatellar area. She is doing well after her scope. She does have pain to be expected in the back of her knee but denies any calf pain and denies any significant issues otherwise.  On examination her knee are removed her sutures easily. She's got good range of motion of her knee overall feels stable.  I did answer all her questions relation this knee and her surgery. I will have her work on quad strengthening exercises we'll keep her out of work until June 4. I'll see her back myself in a month and we'll figure out at that point to proceed with right shoulder surgery which she'll need arthroscopically to address some rotator cuff issues of her shoulder that we've been seeing her for for a while that is been giving her a lot of problems as well.

## 2016-10-30 DIAGNOSIS — R944 Abnormal results of kidney function studies: Secondary | ICD-10-CM | POA: Diagnosis not present

## 2016-11-01 HISTORY — PX: KNEE SURGERY: SHX244

## 2016-11-04 ENCOUNTER — Telehealth (INDEPENDENT_AMBULATORY_CARE_PROVIDER_SITE_OTHER): Payer: Self-pay | Admitting: Orthopaedic Surgery

## 2016-11-04 NOTE — Telephone Encounter (Signed)
The only other thing we can do is to send her to physical therapy to help her strengthen her legs and hopefully decrease her pain

## 2016-11-04 NOTE — Telephone Encounter (Signed)
Please advise 

## 2016-11-04 NOTE — Telephone Encounter (Signed)
Patient called advised she is having soreness in both legs which is making it hard for her to stand for very long. Patient said the pain medicine is not helping her. Patient said she is doing the exercises that she was told to do. Patient asked if there is anything else she can do. The number to contact patient is [phone]

## 2016-11-05 NOTE — Telephone Encounter (Signed)
Patient aware of the below message. She states its just a "sore pain" but getting better

## 2016-11-13 DIAGNOSIS — R399 Unspecified symptoms and signs involving the genitourinary system: Secondary | ICD-10-CM | POA: Diagnosis not present

## 2016-11-13 DIAGNOSIS — N3000 Acute cystitis without hematuria: Secondary | ICD-10-CM | POA: Diagnosis not present

## 2016-11-19 ENCOUNTER — Ambulatory Visit: Payer: Medicare Other | Admitting: Gynecology

## 2016-11-21 ENCOUNTER — Ambulatory Visit (INDEPENDENT_AMBULATORY_CARE_PROVIDER_SITE_OTHER): Payer: Medicare Other | Admitting: Orthopaedic Surgery

## 2016-11-21 DIAGNOSIS — Z9889 Other specified postprocedural states: Secondary | ICD-10-CM

## 2016-11-21 MED ORDER — DICLOFENAC SODIUM 1 % TD GEL: 2.0000 g | Freq: Four times a day (QID) | TRANSDERMAL | 3 refills | Status: DC

## 2016-11-21 NOTE — Progress Notes (Signed)
The patient is following up after left knee arthroscopy done just over months ago. She's having some lower leg soreness but she points the pes bursa both knees as source of her pain she otherwise doing well. Sit sitting now for a while. Some stiffness and has to be expected she understands.  On examination her left knee does not show any significant effusion at this point she has good range of motion of it. She is quite tender over the pes bursa slowly increase her activities. I'll see her back in 3 months as she doing overallshe was consider her arthroscopic intervention on her right shoulder.

## 2016-11-28 ENCOUNTER — Encounter: Payer: Self-pay | Admitting: Gynecology

## 2016-11-28 ENCOUNTER — Inpatient Hospital Stay (INDEPENDENT_AMBULATORY_CARE_PROVIDER_SITE_OTHER): Payer: Medicare Other | Admitting: Orthopaedic Surgery

## 2016-11-28 ENCOUNTER — Ambulatory Visit (INDEPENDENT_AMBULATORY_CARE_PROVIDER_SITE_OTHER): Payer: Medicare Other | Admitting: Gynecology

## 2016-11-28 VITALS — BP 138/78

## 2016-11-28 DIAGNOSIS — N760 Acute vaginitis: Secondary | ICD-10-CM

## 2016-11-28 DIAGNOSIS — N898 Other specified noninflammatory disorders of vagina: Secondary | ICD-10-CM

## 2016-11-28 DIAGNOSIS — B3731 Acute candidiasis of vulva and vagina: Secondary | ICD-10-CM

## 2016-11-28 DIAGNOSIS — B9689 Other specified bacterial agents as the cause of diseases classified elsewhere: Secondary | ICD-10-CM | POA: Diagnosis not present

## 2016-11-28 DIAGNOSIS — N761 Subacute and chronic vaginitis: Secondary | ICD-10-CM | POA: Diagnosis not present

## 2016-11-28 DIAGNOSIS — B373 Candidiasis of vulva and vagina: Secondary | ICD-10-CM

## 2016-11-28 LAB — WET PREP FOR TRICH, YEAST, CLUE
CLUE CELLS WET PREP: NONE SEEN
TRICH WET PREP: NONE SEEN

## 2016-11-28 MED ORDER — CLINDAMYCIN PHOSPHATE 2 % VA CREA: 1.0000 | TOPICAL_CREAM | Freq: Every day | VAGINAL | 0 refills | Status: DC

## 2016-11-28 MED ORDER — FLUCONAZOLE 150 MG PO TABS: 150.0000 mg | ORAL_TABLET | Freq: Once | ORAL | 2 refills | Status: AC

## 2016-11-28 NOTE — Patient Instructions (Addendum)
Clindamycin vaginal cream What is this medicine? CLINDAMYCIN ([place] sin) is a lincosamide antibiotic. It is used to treat vaginal infections caused by certain bacteria. This medicine may be used for other purposes; ask your health care provider or pharmacist if you have questions. COMMON BRAND NAME(S): Cleocin, ClindaMax, Clindesse What should I tell my health care provider before I take this medicine? They need to know if you have any of these conditions: -diarrhea -inflammatory bowel disease -kidney or liver disease -stomach problems like colitis -an unusual or allergic reaction to clindamycin, lincomycin, other medicines, foods, dyes or preservatives -pregnant or trying to get pregnant -breast-feeding How should I use this medicine? This medicine is only for use in the vagina. Place in the vagina using the special applicator supplied with the cream. Wash hands before and after use. Fill the applicator with cream. Lie on your back, part and bend your knees. Insert the applicator into the vagina and push the plunger to expel the cream into the vagina. Wash the applicator with warm soapy water and rinse well. Keep this medicine out of the eyes. If you do get any in your eyes rinse out with plenty of cool tap water. Take your medicine at regular intervals. Do not take your medicine more often than directed. Use this medicine for the full course prescribed by your doctor or health care professional, even if you think your condition is better. Do not stop using except on your the advice of your doctor or health care professional. Talk to your pediatrician regarding the use of this medicine in children. Special care may be needed. Overdosage: If you think you have taken too much of this medicine contact a poison control center or emergency room at once. NOTE: This medicine is only for you. Do not share this medicine with others. What if I miss a dose? If you miss a dose, use it as soon as you  can. If it is almost time for your next dose, use only that dose. Do not use double or extra doses. What may interact with this medicine? Interactions are not expected. Do not use any other vaginal products without telling your doctor or health care professional. This list may not describe all possible interactions. Give your health care provider a list of all the medicines, herbs, non-prescription drugs, or dietary supplements you use. Also tell them if you smoke, drink alcohol, or use illegal drugs. Some items may interact with your medicine. What should I watch for while using this medicine? Tell your doctor or health care professional if your symptoms do not start to get better in a few days. Do not use tampons or douches while using this medicine. Do not have sex until you have finished your treatment. Having sex can make the treatment less effective. After you finish treatment, do not use latex condoms for three days. There may still be some medicine in the vagina. This can damage the latex and make the condom less effective at preventing pregnancy. Your clothing may get soiled. To help prevent reinfection, wear freshly washed cotton, not synthetic, underwear. What side effects may I notice from receiving this medicine? Side effects that you should report to your doctor or health care professional as soon as possible: -allergic reactions like skin rash, itching or hives, swelling of the face, lips, or tongue -diarrhea that is watery or severe -fever or chills, sore throat -increased thirst -itching of the vaginal or genital area -pain during sexual intercourse -stomach pain or  cramps -thick white vaginal discharge -unusual bleeding or bruising Side effects that usually do not require medical attention (report to your doctor or health care professional if they continue or are bothersome): -nausea, vomiting This list may not describe all possible side effects. Call your doctor for medical  advice about side effects. You may report side effects to FDA at 1-800-FDA-1088. Where should I keep my medicine? Keep out of the reach of children. Store at room temperature between 20 and 25 degrees C (68 and 77 degrees F). Do not freeze. Throw away any unused medicine after the expiration date. NOTE: This sheet is a summary. It may not cover all possible information. If you have questions about this medicine, talk to your doctor, pharmacist, or health care provider.  2018 Elsevier/Gold Standard (2012-12-24 16:18:30) Fluconazole tablets What is this medicine? FLUCONAZOLE (floo KON na zole) is an antifungal medicine. It is used to treat certain kinds of fungal or yeast infections. This medicine may be used for other purposes; ask your health care provider or pharmacist if you have questions. COMMON BRAND NAME(S): Diflucan What should I tell my health care provider before I take this medicine? They need to know if you have any of these conditions: -history of irregular heart beat -kidney disease -an unusual or allergic reaction to fluconazole, other azole antifungals, medicines, foods, dyes, or preservatives -pregnant or trying to get pregnant -breast-feeding How should I use this medicine? Take this medicine by mouth. Follow the directions on the prescription label. Do not take your medicine more often than directed. Talk to your pediatrician regarding the use of this medicine in children. Special care may be needed. This medicine has been used in children as young as 25 months of age. Overdosage: If you think you have taken too much of this medicine contact a poison control center or emergency room at once. NOTE: This medicine is only for you. Do not share this medicine with others. What if I miss a dose? If you miss a dose, take it as soon as you can. If it is almost time for your next dose, take only that dose. Do not take double or extra doses. What may interact with this medicine? Do  not take this medicine with any of the following medications: -astemizole -certain medicines for irregular heart beat like dofetilide, dronedarone, quinidine -cisapride -erythromycin -lomitapide -other medicines that prolong the QT interval (cause an abnormal heart rhythm) -pimozide -terfenadine -thioridazine -tolvaptan -ziprasidone This medicine may also interact with the following medications: -antiviral medicines for HIV or AIDS -birth control pills -certain antibiotics like rifabutin, rifampin -certain medicines for blood pressure like amlodipine, isradipine, felodipine, hydrochlorothiazide, losartan, nifedipine -certain medicines for cancer like cyclophosphamide, vinblastine, vincristine -certain medicines for cholesterol like atorvastatin, lovastatin, fluvastatin, simvastatin -certain medicines for depression, anxiety, or psychotic disturbances like amitriptyline, midazolam, nortriptyline, triazolam -certain medicines for diabetes like glipizide, glyburide, tolbutamide -certain medicines for pain like alfentanil, fentanyl, methadone -certain medicines for seizures like carbamazepine, phenytoin -certain medicines that treat or prevent blood clots like warfarin -halofantrine -medicines that lower your chance of fighting infection like cyclosporine, prednisone, tacrolimus -NSAIDS, medicines for pain and inflammation, like celecoxib, diclofenac, flurbiprofen, ibuprofen, meloxicam, naproxen -other medicines for fungal infections -sirolimus -theophylline -tofacitinib This list may not describe all possible interactions. Give your health care provider a list of all the medicines, herbs, non-prescription drugs, or dietary supplements you use. Also tell them if you smoke, drink alcohol, or use illegal drugs. Some items may interact with your medicine. What  should I watch for while using this medicine? Visit your doctor or health care professional for regular checkups. If you are taking  this medicine for a long time you may need blood work. Tell your doctor if your symptoms do not improve. Some fungal infections need many weeks or months of treatment to cure. Alcohol can increase possible damage to your liver. Avoid alcoholic drinks. If you have a vaginal infection, do not have sex until you have finished your treatment. You can wear a sanitary napkin. Do not use tampons. Wear freshly washed cotton, not synthetic, panties. What side effects may I notice from receiving this medicine? Side effects that you should report to your doctor or health care professional as soon as possible: -allergic reactions like skin rash or itching, hives, swelling of the lips, mouth, tongue, or throat -dark urine -feeling dizzy or faint -irregular heartbeat or chest pain -redness, blistering, peeling or loosening of the skin, including inside the mouth -trouble breathing -unusual bruising or bleeding -vomiting -yellowing of the eyes or skin Side effects that usually do not require medical attention (report to your doctor or health care professional if they continue or are bothersome): -changes in how food tastes -diarrhea -headache -stomach upset or nausea This list may not describe all possible side effects. Call your doctor for medical advice about side effects. You may report side effects to FDA at 1-800-FDA-1088. Where should I keep my medicine? Keep out of the reach of children. Store at room temperature below 30 degrees C (86 degrees F). Throw away any medicine after the expiration date. NOTE: This sheet is a summary. It may not cover all possible information. If you have questions about this medicine, talk to your doctor, pharmacist, or health care provider.  2018 Elsevier/Gold Standard (2012-12-26 19:37:38) Bacterial Vaginosis Bacterial vaginosis is a vaginal infection that occurs when the normal balance of bacteria in the vagina is disrupted. It results from an overgrowth of certain  bacteria. This is the most common vaginal infection among women ages 32-44. Because bacterial vaginosis increases your risk for STIs (sexually transmitted infections), getting treated can help reduce your risk for chlamydia, gonorrhea, herpes, and HIV (human immunodeficiency virus). Treatment is also important for preventing complications in pregnant women, because this condition can cause an early (premature) delivery. What are the causes? This condition is caused by an increase in harmful bacteria that are normally present in small amounts in the vagina. However, the reason that the condition develops is not fully understood. What increases the risk? The following factors may make you more likely to develop this condition:  Having a new sexual partner or multiple sexual partners.  Having unprotected sex.  Douching.  Having an intrauterine device (IUD).  Smoking.  Drug and alcohol abuse.  Taking certain antibiotic medicines.  Being pregnant.  You cannot get bacterial vaginosis from toilet seats, bedding, swimming pools, or contact with objects around you. What are the signs or symptoms? Symptoms of this condition include:  Grey or white vaginal discharge. The discharge can also be watery or foamy.  A fish-like odor with discharge, especially after sexual intercourse or during menstruation.  Itching in and around the vagina.  Burning or pain with urination.  Some women with bacterial vaginosis have no signs or symptoms. How is this diagnosed? This condition is diagnosed based on:  Your medical history.  A physical exam of the vagina.  Testing a sample of vaginal fluid under a microscope to look for a large amount of bad  bacteria or abnormal cells. Your health care provider may use a cotton swab or a small wooden spatula to collect the sample.  How is this treated? This condition is treated with antibiotics. These may be given as a pill, a vaginal cream, or a medicine  that is put into the vagina (suppository). If the condition comes back after treatment, a second round of antibiotics may be needed. Follow these instructions at home: Medicines  Take over-the-counter and prescription medicines only as told by your health care provider.  Take or use your antibiotic as told by your health care provider. Do not stop taking or using the antibiotic even if you start to feel better. General instructions  If you have a female sexual partner, tell her that you have a vaginal infection. She should see her health care provider and be treated if she has symptoms. If you have a female sexual partner, he does not need treatment.  During treatment: ? Avoid sexual activity until you finish treatment. ? Do not douche. ? Avoid alcohol as directed by your health care provider. ? Avoid breastfeeding as directed by your health care provider.  Drink enough water and fluids to keep your urine clear or pale yellow.  Keep the area around your vagina and rectum clean. ? Wash the area daily with warm water. ? Wipe yourself from front to back after using the toilet.  Keep all follow-up visits as told by your health care provider. This is important. How is this prevented?  Do not douche.  Wash the outside of your vagina with warm water only.  Use protection when having sex. This includes latex condoms and dental dams.  Limit how many sexual partners you have. To help prevent bacterial vaginosis, it is best to have sex with just one partner (monogamous).  Make sure you and your sexual partner are tested for STIs.  Wear cotton or cotton-lined underwear.  Avoid wearing tight pants and pantyhose, especially during summer.  Limit the amount of alcohol that you drink.  Do not use any products that contain nicotine or tobacco, such as cigarettes and e-cigarettes. If you need help quitting, ask your health care provider.  Do not use illegal drugs. Where to find more  information:  Centers for Disease Control and Prevention: AppraiserFraud.fi  American Sexual Health Association (ASHA): www.ashastd.org  [place] Department of Health and Financial controller, Office on Women's Health: DustingSprays.pl or SecuritiesCard.it Contact a health care provider if:  Your symptoms do not improve, even after treatment.  You have more discharge or pain when urinating.  You have a fever.  You have pain in your abdomen.  You have pain during sex.  You have vaginal bleeding between periods. Summary  Bacterial vaginosis is a vaginal infection that occurs when the normal balance of bacteria in the vagina is disrupted.  Because bacterial vaginosis increases your risk for STIs (sexually transmitted infections), getting treated can help reduce your risk for chlamydia, gonorrhea, herpes, and HIV (human immunodeficiency virus). Treatment is also important for preventing complications in pregnant women, because the condition can cause an early (premature) delivery.  This condition is treated with antibiotic medicines. These may be given as a pill, a vaginal cream, or a medicine that is put into the vagina (suppository). This information is not intended to replace advice given to you by your health care provider. Make sure you discuss any questions you have with your health care provider. Document Released: 05/20/2005 Document Revised: 02/03/2016 Document Reviewed: 02/03/2016 Elsevier Interactive  Patient Education  2017 [place]. Vaginal Yeast infection, Adult Vaginal yeast infection is a condition that causes soreness, swelling, and redness (inflammation) of the vagina. It also causes vaginal discharge. This is a common condition. Some women get this infection frequently. What are the causes? This condition is caused by a change in the normal balance of the yeast (candida) and bacteria that live in the vagina. This change causes an  overgrowth of yeast, which causes the inflammation. What increases the risk? This condition is more likely to develop in:  Women who take antibiotic medicines.  Women who have diabetes.  Women who take birth control pills.  Women who are pregnant.  Women who douche often.  Women who have a weak defense (immune) system.  Women who have been taking steroid medicines for a long time.  Women who frequently wear tight clothing.  What are the signs or symptoms? Symptoms of this condition include:  White, thick vaginal discharge.  Swelling, itching, redness, and irritation of the vagina. The lips of the vagina (vulva) may be affected as well.  Pain or a burning feeling while urinating.  Pain during sex.  How is this diagnosed? This condition is diagnosed with a medical history and physical exam. This will include a pelvic exam. Your health care provider will examine a sample of your vaginal discharge under a microscope. Your health care provider may send this sample for testing to confirm the diagnosis. How is this treated? This condition is treated with medicine. Medicines may be over-the-counter or prescription. You may be told to use one or more of the following:  Medicine that is taken orally.  Medicine that is applied as a cream.  Medicine that is inserted directly into the vagina (suppository).  Follow these instructions at home:  Take or apply over-the-counter and prescription medicines only as told by your health care provider.  Do not have sex until your health care provider has approved. Tell your sex partner that you have a yeast infection. That person should go to his or her health care provider if he or she develops symptoms.  Do not wear tight clothes, such as pantyhose or tight pants.  Avoid using tampons until your health care provider approves.  Eat more yogurt. This may help to keep your yeast infection from returning.  Try taking a sitz bath to help  with discomfort. This is a warm water bath that is taken while you are sitting down. The water should only come up to your hips and should cover your buttocks. Do this 3-4 times per day or as told by your health care provider.  Do not douche.  Wear breathable, cotton underwear.  If you have diabetes, keep your blood sugar levels under control. Contact a health care provider if:  You have a fever.  Your symptoms go away and then return.  Your symptoms do not get better with treatment.  Your symptoms get worse.  You have new symptoms.  You develop blisters in or around your vagina.  You have blood coming from your vagina and it is not your menstrual period.  You develop pain in your abdomen. This information is not intended to replace advice given to you by your health care provider. Make sure you discuss any questions you have with your health care provider. Document Released: 02/27/2005 Document Revised: 11/01/2015 Document Reviewed: 11/21/2014 Elsevier Interactive Patient Education  2018 [place] Fistula An anal fistula is an abnormal tunnel that develops between the  bowel and the skin near the outside of the anus, where stool (feces) comes out. The anus has many tiny glands that make lubricating fluid. Sometimes, these glands become plugged and infected, and that can cause a fluid-filled pocket (abscess) to form. An anal fistula often develops after this infection or abscess. What are the causes? In most cases, an anal fistula is caused by a past or current anal abscess. Other causes include:  A complication of surgery.  Trauma to the rectal area.  Radiation to the area.  Medical conditions or diseases, such as: ? Chronic inflammatory bowel disease, such as Crohn disease or ulcerative colitis. ? Colon cancer or rectal cancer. ? Diverticular disease, such as diverticulitis. ? An STD (sexually transmitted disease), such as gonorrhea, chlamydia, or  syphilis. ? An infection that is caused by HIV (human immunodeficiency virus). ? Foreign body in the rectum.  What are the signs or symptoms? Symptoms of this condition include:  Throbbing or constant pain that may be worse while you are sitting.  Swelling or irritation around the anus.  Drainage of pus or blood from an opening near the anus.  Pain with bowel movements.  Fever or chills.  How is this diagnosed? Your health care provider will examine the area to find the openings of the anal fistula and the fistula tract. The external opening of the anal fistula may be seen during a physical exam. You may also have tests, including:  An exam of the rectal area with a gloved hand (digital rectal exam).  An exam with a probe or scope to help locate the internal opening of the fistula.  Imaging tests to find the exact location and path of the fistula. These tests may include X-rays, an ultrasound, a CT scan, or MRI. The path is made visible by a dye that is injected into the fistula opening.  You may have other tests to find the cause of the anal fistula. How is this treated? The most common treatment for an anal fistula is surgery. The type of surgery that is used will depend on where the fistula is located and how complex the fistula is. Surgical options include:  A fistulotomy. The whole fistula is opened up, and the contents are drained to promote healing.  Seton placement. A silk string (seton) is placed into the fistula during a fistulotomy. This helps to drain any infection to promote healing.  Advancement flap procedure. Tissue is removed from your rectum or the skin around the anus and is attached to the opening of the fistula.  Bioprosthetic plug. A cone-shaped plug is made from your tissue and is used to block the opening of the fistula.  Some anal fistulas do not require surgery. A nonsurgical treatment option involves injecting a fibrin glue to seal the fistula. You  also may be prescribed an antibiotic medicine to treat an infection. Follow these instructions at home: Medicines  Take over-the-counter and prescription medicines only as told by your health care provider.  If you were prescribed an antibiotic medicine, take it as told by your health care provider. Do not stop taking the antibiotic even if you start to feel better.  Use a stool softener or a laxative if told to do so by your health care provider. General instructions  Eat a high-fiber diet as told by your health care provider. This can help to prevent constipation.  Drink enough fluid to keep your urine clear or pale yellow.  Take a warm sitz bath for  15-20 minutes, 3-4 times per day, or as told by your health care provider. Sitz baths can ease your pain and discomfort and help with healing.  Follow good hygiene to keep the anal area as clean and dry as possible. Use wet toilet paper or a moist towelette after each bowel movement.  Keep all follow-up visits as told by your health care provider. This is important. Contact a health care provider if:  You have increased pain that is not controlled with medicines.  You have new redness or swelling around the anal area.  You have new fluid, blood, or pus coming from the anal area.  You have tenderness or warmth around the anal area. Get help right away if:  You have a fever.  You have severe pain.  You have chills or diarrhea.  You have severe problems urinating or having a bowel movement. This information is not intended to replace advice given to you by your health care provider. Make sure you discuss any questions you have with your health care provider. Document Released: 05/02/2008 Document Revised: 10/26/2015 Document Reviewed: 08/15/2014 Elsevier Interactive Patient Education  [employer].

## 2016-11-28 NOTE — Progress Notes (Signed)
Patient is a 71 year old that presented to the office today complaining of passing gas to her vagina and occasional stool. Review of patient's record indicated that in 1975 by another provider in our community she had had a transvaginal hysterectomy for benign pathology. Over the course of the year she's complain of slight drainage her vagina but no abnormalities been noted. She was seen in the office on April 4 of this year and complain some low abdominal pressure and had an ultrasound which demonstrated a normal vaginal cuff and ovaries were not visualized. Patient has had history in the past of kidney stones requiring surgery at one point or another. She stated that she was hospitalized in March of last year was septic as a result of a UTI and the Escherichia coli had been one of the microorganisms identified.  Patient with history of transvaginal hysterectomy in 1975 for benign entity. 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 Her mother was also diagnosed and died with tuberous sclerosis and is being followed at [facility] in [facility].  2013 benign colon polyps  Past history vitamin D deficiency  Shingles outbreak perirectal and buttocks area in 2014  Pap smear February 2014 demonstrated LOW GRADE SQUAMOUS INTRAEPITHELIAL LESION: VAIN-1/ HPV (LSIL). Colposcopic directed biopsy demonstrated: Diagnosis 1. Vagina, biopsy, cuff - INFLAMMATION AND SLIGHT SQUAMOUS ATYPIA. - SEE MICROSCOPIC DESCRIPTION 2. Labium, biopsy, left, crease between majora/minora - MILD SQUAMOUS HYPERPLASIA AND HYPERKERATOSIS. - SEE MICROSCOPIC DESCRIPTION 3. Labium, biopsy, right majora medial - MILD SQUAMOUS HYPERPLASIA AND HYPERKERATOSIS. - SEE  MICROSCOPIC DESCRIPTION 4. Labium, biopsy, left majora lateral - MILD SQUAMOUS HYPERPLASIA AND HYPERKERATOSIS. - SEE MICROSCOPIC DESCRIPTION 5. Skin , right groin  Microscopic Comment 1. There is squamous mucosa with inflammation and associated squamous atypia including rare koilocytes. Much of the squamous atypia has features consistent with inflammatory and reactive atypia. Multiple additional levels are examined and the findings are similar. A p16 (HPV) stain is performed and there are rare cells positive in the superficial epithelium which may represent focal human papillomavirus cytopathic effect. No fungi are identified with PAS stain. 2. -5. There is slight, variable squamous hyperplasia associated with hyperkeratosis and there occasional there are foci with koilocytic change. 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 There are no dysplastic changes and there is no evidence of malignancy. - SQUAMOUS HYPERPLASIA AND HYPERKERATOSIS. - SEE MICROSCOPIC DESCRIPTION  As a result of this patient underwent CO2 laser ablation of perirectal, left labia majora and minora and vaginal cuff and right groin hyperplastic lesions and condyloma acuminatum  .Pap smear demonstrated atypical squamous cells of undetermined significance negative for HPV on last office visit or 16 2016.  Patient has had history of diverticulosis but not diverticulitis.   Exam: Abdomen: Soft nontender no rebound or guarding Pelvic: Bartholin urethra Skene was within normal limits Vagina: First to second-degree cystocele. Erythematous fleshy area noted at the vaginal cuff around the 3:00 position nonfriable did not bleed, twisted off this pedicle submitted for histological evaluation several nitrate was placed. Bimanual exam: No palpable masses or tenderness  Wet prep moderate yeast, many white blood cells and moderate  bacteria  Patient not sexually active. Pap smear August 2016 ASCUS negative HPV recommend follow-up  Pap smear in 3 years according to the new guidelines  Concerns about the possibility of underlying colovaginal fistula. 4 x 4 gauzes were placed in the vagina and methylene blue dye was injected into the rectum patient rested for 30 minutes and walked around the exam room and was reexamined to see if there was any evidence of communication whereby demonstrating any dye in the vagina.  Findings: Loss was removed no dye was noted. Clean gauze was placed back into the vagina patient went home and was instructed to return back in 3-4 hours to be reexamined. The vaginal packing was removed there was no evidence of dye or stool.   Assessment/plan: #1 yeast vaginitis will be treated with Diflucan 150 mg one by mouth #2 possible underlying bacterial vaginosis she'll be prescribed Cleocin vaginal cream to apply daily at bedtime for one week #3Vaginal cuff granulation tissue removed silver nitrate applied #4 patient will be referred to the colorectal surgeon Dr. Marcello Moores for further evaluation due to patient's symptoms of passing gas through vagina and experiencing stool through vagina. I have asked patient to apply a tampon intravaginally twice a day the week prior to seeing the colorectal surgeon and to take pictures of any abnormality seen on the tampon so that she can show Dr. Marcello Moores for further evaluation for possible colo rectal fistula.   Time of consultation and procedure  25 minutes

## 2016-11-29 ENCOUNTER — Telehealth: Payer: Self-pay | Admitting: *Deleted

## 2016-11-29 NOTE — Telephone Encounter (Signed)
-----   Message from Terrance Mass, MD sent at 11/28/2016  3:26 PM EDT ----- Anderson Malta, please make an appointment for this patient with Dr. Marcello Moores Colorectal surgeon for possible colorectal fistula. Please send her copy of my office note from today.

## 2016-11-29 NOTE — Telephone Encounter (Signed)
Referral form faxed to [facility] surgery they will contact pt to schedule, and fax me back with time and date.

## 2016-12-02 NOTE — Telephone Encounter (Signed)
Appointment on 12/31/16 @ 10:00am pt aware by [facility].

## 2016-12-31 ENCOUNTER — Other Ambulatory Visit (INDEPENDENT_AMBULATORY_CARE_PROVIDER_SITE_OTHER): Payer: Self-pay

## 2016-12-31 ENCOUNTER — Encounter (INDEPENDENT_AMBULATORY_CARE_PROVIDER_SITE_OTHER): Payer: Self-pay | Admitting: Orthopaedic Surgery

## 2016-12-31 ENCOUNTER — Ambulatory Visit (INDEPENDENT_AMBULATORY_CARE_PROVIDER_SITE_OTHER): Payer: Medicare Other | Admitting: Orthopaedic Surgery

## 2016-12-31 DIAGNOSIS — R159 Full incontinence of feces: Secondary | ICD-10-CM | POA: Diagnosis not present

## 2016-12-31 DIAGNOSIS — M25562 Pain in left knee: Secondary | ICD-10-CM | POA: Diagnosis not present

## 2016-12-31 DIAGNOSIS — G8929 Other chronic pain: Secondary | ICD-10-CM

## 2016-12-31 MED ORDER — METHYLPREDNISOLONE ACETATE 40 MG/ML IJ SUSP
40.0000 mg | INTRAMUSCULAR | Status: AC | PRN
  Administered 2016-12-31: 40 mg via INTRA_ARTICULAR

## 2016-12-31 MED ORDER — LIDOCAINE HCL 1 % IJ SOLN
3.0000 mL | INTRAMUSCULAR | Status: AC | PRN
  Administered 2016-12-31: 3 mL

## 2016-12-31 NOTE — Progress Notes (Signed)
Office Visit Note   Patient: [name]           Date of Birth: 08-31-1945           MRN: [id number] Visit Date: 12/31/2016              Requested by: Carol Ada, [place], [place] [id number] PCP: Carol Ada, MD   Assessment & Plan: Visit Diagnoses: No diagnosis found.  Plan: I feel that trying a steroid injection in her left knee today will be helpful as well as set her up for outpatient physical therapy to work on bilateral lower extremity strengthening and pain reduction. Hopefully they can work on strengthening the quads and hamstrings as well as her ankles this will improve her function and decrease her pain. She would like to have this at [facility] with outpatient physical therapy there. I'll see her back in a month to see how she is responded. Of note she tolerated the steroid injection well and her left knee.  Follow-Up Instructions: Return in about 4 weeks (around 01/28/2017).   Orders:  No orders of the defined types were placed in this encounter.  No orders of the defined types were placed in this encounter.     Procedures: Large Joint Inj Date/Time: 12/31/2016 8:28 AM Performed by: Mcarthur Rossetti Authorized by: Mcarthur Rossetti   Location:  Knee Site:  L knee Ultrasound Guidance: No   Fluoroscopic Guidance: No   Arthrogram: No   Medications:  3 mL lidocaine 1 %; 40 mg methylPREDNISolone acetate 40 MG/ML     Clinical Data: No additional findings.   Subjective: Chief Complaint  Patient presents with  . Left Knee - Routine Post Op  The patient is still in postoperative follow-up status post a left knee arthroscopy. She had a pop in her knee this past week and some swelling and pain. She needed to be checked out today to make sure things were doing well.  HPI  Review of Systems She denies any fever, chills, nausea, vomiting  Objective: Vital Signs: There were no vitals taken for this  visit.  Physical Exam She is alert or 3 and in no acute distress. She is not walking with a limp or an assistive device Ortho Exam E[place]ation both knee show the knees hyperextend. The left operative knee shows no effusion today. There is global tenderness but a negative McMurray's and Lachman's. Specialty Comments:  No specialty comments available.  Imaging: No results found.   PMFS History: Patient Active Problem List   Diagnosis Date Noted  . Status post arthroscopy of left knee 10/24/2016  . Acute pyelonephritis 08/18/2016  . Chest pressure 08/18/2016  . Essential hypertension 08/18/2016  . Hyponatremia 08/18/2016  . Diastolic dysfunction 74/16/3845  . AKI (acute kidney injury) ([place]) 08/18/2016  . Sepsis ([place])   . Acute lower UTI   . Tachycardia   . Osteoarthritis of right hip 02/17/2015  . 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 Atrial tachycardia ([place]) 12/12/2014  . 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 Recurrent dislocation of hip 10/17/2014  . Dislocation of internal left hip prosthesis ([place]) 10/16/2014  . Ectopic atrial rhythm 10/12/2014  . Atrial bigeminy 10/12/2014  . Osteoarthritis of left hip 09/16/2014  . Status post total replacement of left hip 09/16/2014  . ASCUS favoring benign 09/05/2014  . History of vitamin B deficiency 05/02/2014  . Acute bronchitis  04/18/2014  . Oral aphthous ulcer 04/18/2014  . Edema 12/21/2013  . Irregular heart beat 12/21/2013  . Anuria 11/12/2013  . History of cervical dysplasia 09/21/2013  . Rectocele, female 09/21/2013  . Kidney stone 09/21/2013  . Rash and nonspecific skin eruption 06/25/2013  . Obesity (BMI 30-39.9) 06/16/2013  . IBS (irritable bowel syndrome) 12/22/2012  . Other sleep disturbances 10/27/2012  . Elevated BP 09/29/2012  . Weight gain 08/17/2012  . Osteopenia 08/17/2012  . Vulvar lesion 08/10/2012  . Tuberous sclerosis ([place]) 07/14/2012  . Condyloma  acuminatum in female 07/14/2012  . Shingles 07/14/2012  . Vitamin D deficiency 07/14/2012   Past Medical History:  Diagnosis Date  . Arthritis   . DDD (degenerative disc disease), lumbosacral   . Diverticulosis   . Falls    hx of   . GERD (gastroesophageal reflux disease)   . H/O hiatal hernia   . History of diverticulitis of colon   . History of epilepsy    childhood age 47 to 58  ---secondary to high calcium deposts of optic nerve---  none since age 5 with pregency  . History of gastric ulcer   . History of kidney stones   . History of shingles    MARCH 2014--  BACK AREA  . History of vulvar dysplasia    S/P  LASER ABLATION 2014  . IBS (irritable bowel syndrome)   . Mild obstructive sleep apnea    SLEEP STUDY  11-19-2012--  NO CPAP RX (DID NOT MEET CRITIRIA)  . Nodule of right lung    BENIGN--- AND STABLE PER  CT  11/2013  . Osteoporosis   . Renal cyst, left    STABLE PER CT 11/2013  . Seizures ([place])   . 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 Heart disease Mother   . Tuberous sclerosis Mother   . Uterine cancer Mother   . Tuberous sclerosis Maternal Grandmother   . Tuberous sclerosis Sister   . Tuberous sclerosis Daughter   . Tuberous sclerosis Son        2 sons  . Tuberous sclerosis Maternal Aunt   . Tuberous sclerosis Maternal Aunt   . Tuberous sclerosis Maternal Aunt   . Tuberous sclerosis Cousin     Past Surgical History:  Procedure Laterality Date  . ANTERIOR HIP REVISION Left 10/18/2014   Procedure: EXCHANGE LEFT HIP BALL OF DIRECT ANTERIOR TOTAL HIP ARTHROPLASTY;  Surgeon: Mcarthur Rossetti, MD;  Location: [facility];  Service: Orthopedics;  Laterality: Left;  . AUGMENTATION MAMMAPLASTY     SALINE  . BLADDER SURGERY  1991   bladder reconstruction of torsion  . CARDIAC CATHETERIZATION  06-26-2001   NORMAL CORONARY  ARTERIES  . CATARACT EXTRACTION W/ INTRAOCULAR LENS  IMPLANT, BILATERAL    . CHOLECYSTECTOMY  11-18-2003  . CO2 LASER APPLICATION N/A 11/02/9507   Procedure: CO2 LASER APPLICATION;  Surgeon: Terrance Mass, MD;  Location[facility];  Service: Gynecology;  Laterality: N/A;CO2 laser of vaginal and vulvar lesions  . COLONOSCOPY    . CYSTOSCOPY WITH RETROGRADE PYELOGRAM, URETEROSCOPY AND STENT PLACEMENT Right 11/22/2013   Procedure: CYSTOSCOPY WITH RETROGRADE PYELOGRAM, URETEROSCOPY AND STENT PLACEMENT;  Surgeon: Sharyn Creamer, MD;  Location[facility];  Service: Urology;  Laterality: Right;  . CYSTOSCOPY/RETROGRADE/URETEROSCOPY Right 12/01/2013   Procedure: CYSTOSCOPY, RIGHT RETROGRADE/RIGHT  DIGITAL URETEROSCOPY, RIGHT URETERAL STENT EXCHANGE;  Surgeon: Sharyn Creamer, MD;  Location: [facility];  Service: Urology;  Laterality: Right;  STAGED RIGHT URETEROSCOPY RIGHT URETER DILATION    . DILATION AND CURETTAGE OF UTERUS  multiple -- last one 1975  . GYNECOLOGIC CRYOSURGERY    . HEMORRHOID SURGERY  1970's  . HIP CLOSED REDUCTION Left 10/16/2014   Procedure: CLOSED MANIPULATION HIP;  Surgeon: Mcarthur Rossetti, MD;  Location: [facility];  Service: Orthopedics;  Laterality: Left;  . HOLMIUM LASER APPLICATION Right 01/02/600   Procedure: HOLMIUM LASER APPLICATION;  Surgeon: Sharyn Creamer, MD;  Location[facility];  Service: Urology;  Laterality: Right;  . KNEE SURGERY  11/2016  . [place] SURGERY  1985  . ORIF RIGHT BIMALLEOLAR ANKLE FX  12-18-2003  . TOTAL HIP ARTHROPLASTY Left 09/16/2014   Procedure: LEFT TOTAL HIP ARTHROPLASTY ANTERIOR APPROACH;  Surgeon: Mcarthur Rossetti, MD;  Location: [facility];  Service: Orthopedics;  Laterality: Left;  . TOTAL HIP ARTHROPLASTY Right 02/17/2015   Procedure: RIGHT TOTAL HIP ARTHROPLASTY ANTERIOR APPROACH;  Surgeon: Mcarthur Rossetti, MD;  Location: [facility];  Service:  Orthopedics;  Laterality: Right;  . UPPER GASTROINTESTINAL ENDOSCOPY    . VAGINAL HYSTERECTOMY  1975  . WIDE LOCAL EXCISION OF FOURCHETTE AND PERINEUM  04-06-2009   VULVAR CARCINOMA IN SITU   Social History   Occupational History  . driver Other    [employer]   Social History Main Topics  . Smoking status: Former Smoker    Packs/day: 2.00    Years: 25.00    Types: Cigarettes    Quit date: 06/03/1998  . Smokeless tobacco: Never Used  . Alcohol use Yes     Comment: rarely has a glass of wine  . Drug use: No  . Sexual activity: Not Currently    Partners: Male

## 2017-01-08 ENCOUNTER — Ambulatory Visit: Payer: Medicare Other | Attending: Orthopaedic Surgery | Admitting: Physical Therapy

## 2017-01-08 DIAGNOSIS — G8929 Other chronic pain: Secondary | ICD-10-CM | POA: Diagnosis not present

## 2017-01-08 DIAGNOSIS — R29898 Other symptoms and signs involving the musculoskeletal system: Secondary | ICD-10-CM | POA: Insufficient documentation

## 2017-01-08 DIAGNOSIS — M25562 Pain in left knee: Secondary | ICD-10-CM | POA: Insufficient documentation

## 2017-01-08 DIAGNOSIS — R262 Difficulty in walking, not elsewhere classified: Secondary | ICD-10-CM | POA: Diagnosis not present

## 2017-01-09 NOTE — Therapy (Signed)
[facility] [address]  [place], [place], [address] Phone: [phone]   Fax:  [phone]  Physical Therapy Evaluation  Patient Details  Name: [name] MRN: [id number] Date of Birth: 1946-05-17 Referring Provider: Dr. Jean Rosenthal  Encounter Date: 01/08/2017      PT End of Session - 01/09/17 0745    Visit Number 1   Number of Visits 12   Date for PT Re-Evaluation 02/20/17   Authorization Type Medicare   PT Start Time 1620   PT Stop Time 1703   PT Time Calculation (min) 43 min   Activity Tolerance Patient tolerated treatment well   Behavior During Therapy [facility] for tasks assessed/performed      Past Medical History:  Diagnosis Date  . Arthritis   . DDD (degenerative disc disease), lumbosacral   . Diverticulosis   . Falls    hx of   . GERD (gastroesophageal reflux disease)   . H/O hiatal hernia   . History of diverticulitis of colon   . History of epilepsy    childhood age 87 to 4  ---secondary to high calcium deposts of optic nerve---  none since age 36 with pregency  . History of gastric ulcer   . History of kidney stones   . History of shingles    MARCH 2014--  BACK AREA  . History of vulvar dysplasia    S/P  LASER ABLATION 2014  . IBS (irritable bowel syndrome)   . Mild obstructive sleep apnea    SLEEP STUDY  11-19-2012--  NO CPAP RX (DID NOT MEET CRITIRIA)  . Nodule of right lung    BENIGN--- AND STABLE PER  CT  11/2013  . Osteoporosis   . Renal cyst, left    STABLE PER CT 11/2013  . Seizures ([place])   . 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 ANTERIOR HIP REVISION Left 10/18/2014   Procedure: EXCHANGE LEFT HIP BALL OF DIRECT ANTERIOR TOTAL HIP ARTHROPLASTY;  Surgeon: Mcarthur Rossetti, MD;  Location: [facility];  Service: Orthopedics;   Laterality: Left;  . AUGMENTATION MAMMAPLASTY     SALINE  . BLADDER SURGERY  1991   bladder reconstruction of torsion  . CARDIAC CATHETERIZATION  06-26-2001   NORMAL CORONARY ARTERIES  . CATARACT EXTRACTION W/ INTRAOCULAR LENS  IMPLANT, BILATERAL    . CHOLECYSTECTOMY  11-18-2003  . CO2 LASER APPLICATION N/A 07/05/252   Procedure: CO2 LASER APPLICATION;  Surgeon: Terrance Mass, MD;  Location: [facility];  Service: Gynecology;  Laterality: N/A;CO2 laser of vaginal and vulvar lesions  . COLONOSCOPY    . CYSTOSCOPY WITH RETROGRADE PYELOGRAM, URETEROSCOPY AND STENT PLACEMENT Right 11/22/2013   Procedure: CYSTOSCOPY WITH RETROGRADE PYELOGRAM, URETEROSCOPY AND STENT PLACEMENT;  Surgeon: Sharyn Creamer, MD;  Location: [facility];  Service: Urology;  Laterality: Right;  . CYSTOSCOPY/RETROGRADE/URETEROSCOPY Right 12/01/2013   Procedure: CYSTOSCOPY, RIGHT RETROGRADE/RIGHT DIGITAL URETEROSCOPY, RIGHT URETERAL STENT EXCHANGE;  Surgeon: Sharyn Creamer, MD;  Location: [facility];  Service: Urology;  Laterality: Right;  STAGED RIGHT URETEROSCOPY RIGHT URETER DILATION    . DILATION AND CURETTAGE OF UTERUS  multiple -- last one 1975  . GYNECOLOGIC CRYOSURGERY    . HEMORRHOID SURGERY  1970's  . HIP CLOSED REDUCTION Left 10/16/2014   Procedure: CLOSED  MANIPULATION HIP;  Surgeon: Mcarthur Rossetti, MD;  Location: [facility];  Service: Orthopedics;  Laterality: Left;  . HOLMIUM LASER APPLICATION Right 07/07/4008   Procedure: HOLMIUM LASER APPLICATION;  Surgeon: Sharyn Creamer, MD;  Location: [facility];  Service: Urology;  Laterality: Right;  . KNEE SURGERY  11/2016  . [place] SURGERY  1985  . ORIF RIGHT BIMALLEOLAR ANKLE FX  12-18-2003  . TOTAL HIP ARTHROPLASTY Left 09/16/2014   Procedure: LEFT TOTAL HIP ARTHROPLASTY ANTERIOR APPROACH;  Surgeon: Mcarthur Rossetti, MD;  [facility];  Service: Orthopedics;  Laterality: Left;   . TOTAL HIP ARTHROPLASTY Right 02/17/2015   Procedure: RIGHT TOTAL HIP ARTHROPLASTY ANTERIOR APPROACH;  Surgeon: Mcarthur Rossetti, MD;  [facility];  Service: Orthopedics;  Laterality: Right;  . UPPER GASTROINTESTINAL ENDOSCOPY    . VAGINAL HYSTERECTOMY  1975  . WIDE LOCAL EXCISION OF FOURCHETTE AND PERINEUM  04-06-2009   VULVAR CARCINOMA IN SITU    There were no vitals filed for this visit.       Subjective Assessment - 01/08/17 1742    Subjective Patient reporting 2 years prior hip surgery (L THA) - dislocated x2 30 days later. Revision of L THA. 4 months later had R THA. Reports pain has persisted since surgery approx. 2 years ago. Had scope of L knee March 2018 - continued pain. Had injection last week - has had some good relief - able to walk prolonged periods at work. Cannot climb up a step ladder, cannot get into tall vehicle. Able to walk up to 10 minutes, unable to stand for prolonged periods without shifting weight. Also has ankle pain - improves with walking. Not sleeping well (3hours at a time.)   Pertinent History Tuberous sclerosis, B THA   Patient Stated Goals improve pain and function   Currently in Pain? Yes   Pain Score 2    Pain Location Knee   Pain Orientation Left   Pain Descriptors / Indicators Discomfort   Pain Type Chronic pain   Pain Onset More than a month ago   Pain Frequency Intermittent   Aggravating Factors  walking, standing            [facility] PT Assessment - 01/08/17 1744      Assessment   Medical Diagnosis Chronic pain of L knee   Referring Provider Dr. Jean Rosenthal   Onset Date/Surgical Date --  pain increase Sping 2018   Prior Therapy yes - not for this issue     Precautions   Precautions None     Restrictions   Weight Bearing Restrictions No     Balance Screen   Has the patient fallen in the past 6 months No   Has the patient had a decrease in activity level because of a fear of falling?  No   Is the patient  reluctant to leave their home because of a fear of falling?  No     Home Ecologist residence     Prior Function   Level of Independence Independent   Vocation Part time employment   [place] for [employer] - walks approx. football field in a single bout     Cognition   Overall Cognitive Status Within Functional Limits for tasks assessed     Observation/Other Assessments   Focus on Therapeutic Outcomes (FOTO)  Knee: 33 (67% limited, predicted 53% limited)     Sensation   Light Touch  Appears Intact     Coordination   Gross Motor Movements are Fluid and Coordinated Yes     ROM / Strength   AROM / PROM / Strength AROM;Strength     AROM   AROM Assessment Site Knee   Right/Left Knee Right;Left   Right Knee Extension -1   Right Knee Flexion 124   Left Knee Extension -1   Left Knee Flexion 117     Strength   Strength Assessment Site Hip;Knee   Right/Left Hip Right;Left   Right Hip Flexion 4/5   Left Hip Flexion 4-/5   Right/Left Knee Right;Left   Right Knee Flexion 4+/5   Right Knee Extension 4+/5   Left Knee Flexion 4+/5   Left Knee Extension 4+/5     Flexibility   Soft Tissue Assessment /Muscle Length yes   Hamstrings B moderate tightness     Palpation   Patella mobility L: poor patella mobility in all directions     Special Tests    Special Tests Knee Special Tests   Knee Special tests  Patellofemoral Grind Test (Clarke's Sign)     Patellofemoral Grind test (Clark's Sign)   Findings Postive   Side  Left            Objective measurements completed on examination: See above findings.          [facility] Adult PT Treatment/Exercise - 01/09/17 0001      Exercises   Exercises Knee/Hip     Knee/Hip Exercises: Stretches   Passive Hamstring Stretch Both;3 reps;30 seconds   Passive Hamstring Stretch Limitations supine with strap     Knee/Hip Exercises: Seated   Long Arc Quad Left;10 reps    Long Arc Quad Limitations 5 sec hold; with ball squeeze     Knee/Hip Exercises: Supine   Bridges Both;10 reps   Straight Leg Raises Left;10 reps                PT Education - 01/08/17 1744    Education provided Yes   Education Details exam findings, POC, HEP   Person(s) Educated Patient   Methods Explanation;Demonstration;Handout   Comprehension Verbalized understanding;Returned demonstration;Need further instruction          PT Short Term Goals - 01/09/17 0752      PT SHORT TERM GOAL #1   Title patient to be independent with initial HEP   Status New   Target Date 01/30/17           PT Long Term Goals - 01/09/17 0753      PT LONG TERM GOAL #1   Title patient to be independent with advanced HEP   Status New   Target Date 02/20/17     PT LONG TERM GOAL #2   Title Patient to improve gross L LE strength to >/= 4+/5   Status New   Target Date 02/20/17     PT LONG TERM GOAL #3   Title patient to demonstrate ability ot ascend/descend up 1 flight of stairs with reciprocal gait pattern and single rail   Status New   Target Date 02/20/17     PT LONG TERM GOAL #4   Title patient to report ability to stand/walk >/= 30 min at a time without pain being a limiting factor   Status New   Target Date 02/20/17                Plan - 01/09/17 0748    Clinical Impression Statement Patient is  a 71 y/o female presenting to [facility] today for primary complaints of L knee pain, of which she has tried other measures for pain contorl such as scope of L knee and an injection. Patient noting much relief from injection, however, does still have daily pain along with difficulty walking and standing for prolonged periods. Patient today with near symmetrical AROM as well as strength, however slightly reduced as compared to uninvolved LE. Patient given initial HEP for strengthening and stretching with good carryover. patient to benefit from PT to improve functional mobility and  overall quality of life.    History and Personal Factors relevant to plan of care: Tuberous sclerosis   Clinical Presentation Stable   Clinical Presentation due to: stable condition of PMH   Clinical Decision Making Low   Rehab Potential Good   PT Frequency 2x / week   PT Duration 6 weeks   PT Treatment/Interventions ADLs/Self Care Home Management;Cryotherapy;Electrical Stimulation;Iontophoresis 4mg /ml Dexamethasone;Moist Heat;Ultrasound;Neuromuscular re-education;Balance training;Therapeutic exercise;Therapeutic activities;Functional mobility training;Gait training;Stair training;Patient/family education;Manual techniques;Passive range of motion;Vasopneumatic Device;Taping;Dry needling   Consulted and Agree with Plan of Care Patient      Patient will benefit from skilled therapeutic intervention in order to improve the following deficits and impairments:  Abnormal gait, Decreased activity tolerance, Decreased mobility, Decreased strength, Difficulty walking, Pain  Visit Diagnosis: Chronic pain of left knee - Plan: PT plan of care cert/re-cert  Difficulty in walking, not elsewhere classified - Plan: PT plan of care cert/re-cert  Other symptoms and signs involving the musculoskeletal system - Plan: PT plan of care cert/re-cert      G-Codes - 19/50/93 0747    Functional Assessment Tool Used (Outpatient Only) FOTO: 33 (67% limitation)   Functional Limitation Mobility: Walking and moving around   Mobility: Walking and Moving Around Current Status ([id number]) At least 60 percent but less than 80 percent impaired, limited or restricted   Mobility: Walking and Moving Around Goal Status [phone]) At least 40 percent but less than 60 percent impaired, limited or restricted       Problem List Patient Active Problem List   Diagnosis Date Noted  . Status post arthroscopy of left knee 10/24/2016  . Acute pyelonephritis 08/18/2016  . Chest pressure 08/18/2016  . Essential hypertension 08/18/2016   . Hyponatremia 08/18/2016  . Diastolic dysfunction 98/33/8250  . AKI (acute kidney injury) ([place]) 08/18/2016  . Sepsis ([place])   . Acute lower UTI   . Tachycardia   . Osteoarthritis of right hip 02/17/2015  . 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 Atrial tachycardia ([place]) 12/12/2014  . 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 Recurrent dislocation of hip 10/17/2014  . Dislocation of internal left hip prosthesis ([place]) 10/16/2014  . Ectopic atrial rhythm 10/12/2014  . Atrial bigeminy 10/12/2014  . Osteoarthritis of left hip 09/16/2014  . Status post total replacement of left hip 09/16/2014  . ASCUS favoring benign 09/05/2014  . History of vitamin B deficiency 05/02/2014  . Acute bronchitis 04/18/2014  . Oral aphthous ulcer 04/18/2014  . Edema 12/21/2013  . Irregular heart beat 12/21/2013  . Anuria 11/12/2013  . History of cervical dysplasia 09/21/2013  . Rectocele, female 09/21/2013  . Kidney stone 09/21/2013  . Rash and nonspecific skin eruption 06/25/2013  . Obesity (BMI 30-39.9) 06/16/2013  . IBS (irritable bowel syndrome) 12/22/2012  . Other sleep disturbances 10/27/2012  . Elevated BP 09/29/2012  . Weight gain 08/17/2012  . Osteopenia 08/17/2012  . Vulvar lesion 08/10/2012  .  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 Condyloma acuminatum in female 07/14/2012  . Shingles 07/14/2012  . Vitamin D deficiency 07/14/2012    Lanney Gins, PT, DPT 01/09/17 7:56 AM   [facility] [address]  [place], [place], [address] Phone: [phone]   Fax:  [phone]  Name: [name] MRN: [id number] Date of Birth: [birth year]

## 2017-01-16 ENCOUNTER — Ambulatory Visit: Payer: Medicare Other | Admitting: Physical Therapy

## 2017-01-16 DIAGNOSIS — R262 Difficulty in walking, not elsewhere classified: Secondary | ICD-10-CM | POA: Diagnosis not present

## 2017-01-16 DIAGNOSIS — R29898 Other symptoms and signs involving the musculoskeletal system: Secondary | ICD-10-CM

## 2017-01-16 DIAGNOSIS — M25562 Pain in left knee: Secondary | ICD-10-CM | POA: Diagnosis not present

## 2017-01-16 DIAGNOSIS — G8929 Other chronic pain: Secondary | ICD-10-CM | POA: Diagnosis not present

## 2017-01-16 NOTE — Therapy (Signed)
[facility] [address]  [place], [place], [address] Phone: [phone]   Fax:  [phone]  Physical Therapy Treatment  Patient Details  Name: [name] MRN: [id number] Date of Birth: 1945/08/27 Referring Provider: Dr. Jean Rosenthal  Encounter Date: 01/16/2017      PT End of Session - 01/16/17 1616    Visit Number 2   Number of Visits 12   Date for PT Re-Evaluation 02/20/17   Authorization Type Medicare   PT Start Time 1532   PT Stop Time 1613   PT Time Calculation (min) 41 min   Activity Tolerance Patient tolerated treatment well   Behavior During Therapy [facility] for tasks assessed/performed      Past Medical History:  Diagnosis Date  . Arthritis   . DDD (degenerative disc disease), lumbosacral   . Diverticulosis   . Falls    hx of   . GERD (gastroesophageal reflux disease)   . H/O hiatal hernia   . History of diverticulitis of colon   . History of epilepsy    childhood age 55 to 61  ---secondary to high calcium deposts of optic nerve---  none since age 39 with pregency  . History of gastric ulcer   . History of kidney stones   . History of shingles    MARCH 2014--  BACK AREA  . History of vulvar dysplasia    S/P  LASER ABLATION 2014  . IBS (irritable bowel syndrome)   . Mild obstructive sleep apnea    SLEEP STUDY  11-19-2012--  NO CPAP RX (DID NOT MEET CRITIRIA)  . Nodule of right lung    BENIGN--- AND STABLE PER  CT  11/2013  . Osteoporosis   . Renal cyst, left    STABLE PER CT 11/2013  . Seizures ([place])   . 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 ANTERIOR HIP REVISION Left 10/18/2014   Procedure: EXCHANGE LEFT HIP BALL OF DIRECT ANTERIOR TOTAL HIP ARTHROPLASTY;  Surgeon: Mcarthur Rossetti, MD;  Location: [facility];  Service: Orthopedics;   Laterality: Left;  . AUGMENTATION MAMMAPLASTY     SALINE  . BLADDER SURGERY  1991   bladder reconstruction of torsion  . CARDIAC CATHETERIZATION  06-26-2001   NORMAL CORONARY ARTERIES  . CATARACT EXTRACTION W/ INTRAOCULAR LENS  IMPLANT, BILATERAL    . CHOLECYSTECTOMY  11-18-2003  . CO2 LASER APPLICATION N/A 08/01/5398   Procedure: CO2 LASER APPLICATION;  Surgeon: Terrance Mass, MD;  Location: [facility];  Service: Gynecology;  Laterality: N/A;CO2 laser of vaginal and vulvar lesions  . COLONOSCOPY    . CYSTOSCOPY WITH RETROGRADE PYELOGRAM, URETEROSCOPY AND STENT PLACEMENT Right 11/22/2013   Procedure: CYSTOSCOPY WITH RETROGRADE PYELOGRAM, URETEROSCOPY AND STENT PLACEMENT;  Surgeon: Sharyn Creamer, MD;  Location: [facility];  Service: Urology;  Laterality: Right;  . CYSTOSCOPY/RETROGRADE/URETEROSCOPY Right 12/01/2013   Procedure: CYSTOSCOPY, RIGHT RETROGRADE/RIGHT DIGITAL URETEROSCOPY, RIGHT URETERAL STENT EXCHANGE;  Surgeon: Sharyn Creamer, MD;  Location: [facility];  Service: Urology;  Laterality: Right;  STAGED RIGHT URETEROSCOPY RIGHT URETER DILATION    . DILATION AND CURETTAGE OF UTERUS  multiple -- last one 1975  . GYNECOLOGIC CRYOSURGERY    . HEMORRHOID SURGERY  1970's  . HIP CLOSED REDUCTION Left 10/16/2014   Procedure: CLOSED  MANIPULATION HIP;  Surgeon: Mcarthur Rossetti, MD;  [facility];  Service: Orthopedics;  Laterality: Left;  . HOLMIUM LASER APPLICATION Right 12/03/2200   Procedure: HOLMIUM LASER APPLICATION;  Surgeon: Sharyn Creamer, MD;  Location: [facility];  Service: Urology;  Laterality: Right;  . KNEE SURGERY  11/2016  . [place] SURGERY  1985  . ORIF RIGHT BIMALLEOLAR ANKLE FX  12-18-2003  . TOTAL HIP ARTHROPLASTY Left 09/16/2014   Procedure: LEFT TOTAL HIP ARTHROPLASTY ANTERIOR APPROACH;  Surgeon: Mcarthur Rossetti, MD;  [facility];  Service: Orthopedics;  Laterality: Left;   . TOTAL HIP ARTHROPLASTY Right 02/17/2015   Procedure: RIGHT TOTAL HIP ARTHROPLASTY ANTERIOR APPROACH;  Surgeon: Mcarthur Rossetti, MD;  [facility];  Service: Orthopedics;  Laterality: Right;  . UPPER GASTROINTESTINAL ENDOSCOPY    . VAGINAL HYSTERECTOMY  1975  . WIDE LOCAL EXCISION OF FOURCHETTE AND PERINEUM  04-06-2009   VULVAR CARCINOMA IN SITU    There were no vitals filed for this visit.      Subjective Assessment - 01/16/17 1533    Subjective both kneecaps have hurt this weeks   Pertinent History Tuberous sclerosis, B THA   Patient Stated Goals improve pain and function   Currently in Pain? Yes   Pain Score 2    Pain Location Knee   Pain Orientation Left   Pain Descriptors / Indicators Sore   Pain Type Chronic pain                         [facility] Adult PT Treatment/Exercise - 01/16/17 1548      Knee/Hip Exercises: Aerobic   Nustep L5 x 6 min     Knee/Hip Exercises: Standing   Functional Squat 15 reps   Functional Squat Limitations TRX     Knee/Hip Exercises: Seated   Hamstring Curl Left;15 reps   Hamstring Limitations red tband     Knee/Hip Exercises: Supine   Short Arc Quad Sets Left;15 reps   Short Arc Quad Sets Limitations 2#   Bridges Both;10 reps   Straight Leg Raises Left;10 reps   Straight Leg Raises Limitations 2#   Straight Leg Raise with External Rotation Left;10 reps     Manual Therapy   Manual Therapy Joint mobilization   Joint Mobilization L patellar mobs - all planes                  PT Short Term Goals - 01/16/17 1616      PT SHORT TERM GOAL #1   Title patient to be independent with initial HEP   Status On-going           PT Long Term Goals - 01/16/17 1616      PT LONG TERM GOAL #1   Title patient to be independent with advanced HEP   Status On-going     PT LONG TERM GOAL #2   Title Patient to improve gross L LE strength to >/= 4+/5   Status On-going     PT LONG TERM GOAL #3   Title  patient to demonstrate ability ot ascend/descend up 1 flight of stairs with reciprocal gait pattern and single rail   Status On-going     PT LONG TERM GOAL #4   Title patient to report ability to stand/walk >/= 30 min at a time without pain being a limiting factor   Status On-going  Plan - 01/16/17 1617    Clinical Impression Statement Patient doing well today - reports still finding some pain relief from injection. Doing well today wtih all strengthening activities. Patellar mobs at L knee with improved motion today as well. Will plan to update HEP and continue to progress at next visit.    PT Treatment/Interventions ADLs/Self Care Home Management;Cryotherapy;Electrical Stimulation;Iontophoresis 4mg /ml Dexamethasone;Moist Heat;Ultrasound;Neuromuscular re-education;Balance training;Therapeutic exercise;Therapeutic activities;Functional mobility training;Gait training;Stair training;Patient/family education;Manual techniques;Passive range of motion;Vasopneumatic Device;Taping;Dry needling   Consulted and Agree with Plan of Care Patient      Patient will benefit from skilled therapeutic intervention in order to improve the following deficits and impairments:  Abnormal gait, Decreased activity tolerance, Decreased mobility, Decreased strength, Difficulty walking, Pain  Visit Diagnosis: Chronic pain of left knee  Difficulty in walking, not elsewhere classified  Other symptoms and signs involving the musculoskeletal system     Problem List Patient Active Problem List   Diagnosis Date Noted  . Status post arthroscopy of left knee 10/24/2016  . Acute pyelonephritis 08/18/2016  . Chest pressure 08/18/2016  . Essential hypertension 08/18/2016  . Hyponatremia 08/18/2016  . Diastolic dysfunction 26/94/8546  . AKI (acute kidney injury) ([place]) 08/18/2016  . Sepsis ([place])   . Acute lower UTI   . Tachycardia   . Osteoarthritis of right hip 02/17/2015  . Status post total  replacement of right hip 02/17/2015  . Atrial tachycardia ([place]) 12/12/2014  . 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 Recurrent dislocation of hip 10/17/2014  . Dislocation of internal left hip prosthesis ([place]) 10/16/2014  . Ectopic atrial rhythm 10/12/2014  . Atrial bigeminy 10/12/2014  . Osteoarthritis of left hip 09/16/2014  . Status post total replacement of left hip 09/16/2014  . ASCUS favoring benign 09/05/2014  . History of vitamin B deficiency 05/02/2014  . Acute bronchitis 04/18/2014  . Oral aphthous ulcer 04/18/2014  . Edema 12/21/2013  . Irregular heart beat 12/21/2013  . Anuria 11/12/2013  . History of cervical dysplasia 09/21/2013  . Rectocele, female 09/21/2013  . Kidney stone 09/21/2013  . Rash and nonspecific skin eruption 06/25/2013  . Obesity (BMI 30-39.9) 06/16/2013  . IBS (irritable bowel syndrome) 12/22/2012  . Other sleep disturbances 10/27/2012  . Elevated BP 09/29/2012  . Weight gain 08/17/2012  . Osteopenia 08/17/2012  . Vulvar lesion 08/10/2012  . 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 Condyloma acuminatum in female 07/14/2012  . Shingles 07/14/2012  . Vitamin D deficiency 07/14/2012     Lanney Gins, PT, DPT 01/16/17 5:05 PM   [facility] [address]  [place], [place], [address] Phone: [phone]   Fax:  [phone]  Name: [name] MRN: [id number] Date of Birth: [birth year]

## 2017-01-17 ENCOUNTER — Ambulatory Visit: Payer: Medicare Other | Admitting: Physical Therapy

## 2017-01-17 DIAGNOSIS — R262 Difficulty in walking, not elsewhere classified: Secondary | ICD-10-CM | POA: Diagnosis not present

## 2017-01-17 DIAGNOSIS — M25562 Pain in left knee: Secondary | ICD-10-CM | POA: Diagnosis not present

## 2017-01-17 DIAGNOSIS — R29898 Other symptoms and signs involving the musculoskeletal system: Secondary | ICD-10-CM | POA: Diagnosis not present

## 2017-01-17 DIAGNOSIS — G8929 Other chronic pain: Secondary | ICD-10-CM | POA: Diagnosis not present

## 2017-01-17 NOTE — Patient Instructions (Signed)
Straight Leg Raise: With External Leg Rotation    Lie on back with right leg straight, opposite leg bent. Rotate straight leg out and lift __8__ inches. Repeat __15__ times per set. Do __2__ sets per session.   Mini Squat: Double Leg    With feet shoulder width apart, reach forward for balance and do a mini squat. Keep knees in line with second toe. Knees do not go past toes. Repeat _15__ times per set. . Do _2__ sets per session.

## 2017-01-17 NOTE — Therapy (Signed)
[facility] [address]  [name], [place], [address] Phone: [phone]   Fax:  [phone]  Physical Therapy Treatment  Patient Details  Name: [name] MRN: [id number] Date of Birth: [birth year] Referring Provider: Dr. Jean Rosenthal  Encounter Date: 01/17/2017      PT End of Session - 01/17/17 0839    Visit Number 3   Number of Visits 12   Date for PT Re-Evaluation 02/20/17   Authorization Type Medicare   PT Start Time 0837   PT Stop Time 0918   PT Time Calculation (min) 41 min   Activity Tolerance Patient tolerated treatment well   Behavior During Therapy [facility] for tasks assessed/performed      Past Medical History:  Diagnosis Date  . Arthritis   . DDD (degenerative disc disease), lumbosacral   . Diverticulosis   . Falls    hx of   . GERD (gastroesophageal reflux disease)   . H/O hiatal hernia   . History of diverticulitis of colon   . History of epilepsy    childhood age 31 to 68  ---secondary to high calcium deposts of optic nerve---  none since age 43 with pregency  . History of gastric ulcer   . History of kidney stones   . History of shingles    MARCH 2014--  BACK AREA  . History of vulvar dysplasia    S/P  LASER ABLATION 2014  . IBS (irritable bowel syndrome)   . Mild obstructive sleep apnea    SLEEP STUDY  11-19-2012--  NO CPAP RX (DID NOT MEET CRITIRIA)  . Nodule of right lung    BENIGN--- AND STABLE PER  CT  11/2013  . Osteoporosis   . Renal cyst, left    STABLE PER CT 11/2013  . Seizures ([place])   . 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 ANTERIOR HIP REVISION Left 10/18/2014   Procedure: EXCHANGE LEFT HIP BALL OF DIRECT ANTERIOR TOTAL HIP ARTHROPLASTY;  Surgeon: Mcarthur Rossetti, MD;  Location: [place];  Service: Orthopedics;   Laterality: Left;  . AUGMENTATION MAMMAPLASTY     SALINE  . BLADDER SURGERY  1991   bladder reconstruction of torsion  . CARDIAC CATHETERIZATION  06-26-2001   NORMAL CORONARY ARTERIES  . CATARACT EXTRACTION W/ INTRAOCULAR LENS  IMPLANT, BILATERAL    . CHOLECYSTECTOMY  11-18-2003  . CO2 LASER APPLICATION N/A 06/06/4816   Procedure: CO2 LASER APPLICATION;  Surgeon: Terrance Mass, MD;  Location: [facility];  Service: Gynecology;  Laterality: N/A;CO2 laser of vaginal and vulvar lesions  . COLONOSCOPY    . CYSTOSCOPY WITH RETROGRADE PYELOGRAM, URETEROSCOPY AND STENT PLACEMENT Right 11/22/2013   Procedure: CYSTOSCOPY WITH RETROGRADE PYELOGRAM, URETEROSCOPY AND STENT PLACEMENT;  Surgeon: Sharyn Creamer, MD;  Location: [facility];  Service: Urology;  Laterality: Right;  . CYSTOSCOPY/RETROGRADE/URETEROSCOPY Right 12/01/2013   Procedure: CYSTOSCOPY, RIGHT RETROGRADE/RIGHT DIGITAL URETEROSCOPY, RIGHT URETERAL STENT EXCHANGE;  Surgeon: Sharyn Creamer, MD;  Location: [facility];  Service: Urology;  Laterality: Right;  STAGED RIGHT URETEROSCOPY RIGHT URETER DILATION    . DILATION AND CURETTAGE OF UTERUS  multiple -- last one 1975  . GYNECOLOGIC CRYOSURGERY    . HEMORRHOID SURGERY  1970's  . HIP CLOSED REDUCTION Left 10/16/2014   Procedure: CLOSED  MANIPULATION HIP;  Surgeon: Mcarthur Rossetti, MD;  Location: [facility];  Service: Orthopedics;  Laterality: Left;  . HOLMIUM LASER APPLICATION Right 02/03/5700   Procedure: HOLMIUM LASER APPLICATION;  Surgeon: Sharyn Creamer, MD;  Location: [facility];  Service: Urology;  Laterality: Right;  . KNEE SURGERY  11/2016  . [place] SURGERY  1985  . ORIF RIGHT BIMALLEOLAR ANKLE FX  12-18-2003  . TOTAL HIP ARTHROPLASTY Left 09/16/2014   Procedure: LEFT TOTAL HIP ARTHROPLASTY ANTERIOR APPROACH;  Surgeon: Mcarthur Rossetti, MD;  Location: [facility];  Service: Orthopedics;  Laterality: Left;   . TOTAL HIP ARTHROPLASTY Right 02/17/2015   Procedure: RIGHT TOTAL HIP ARTHROPLASTY ANTERIOR APPROACH;  Surgeon: Mcarthur Rossetti, MD;  Location[facility];  Service: Orthopedics;  Laterality: Right;  . UPPER GASTROINTESTINAL ENDOSCOPY    . VAGINAL HYSTERECTOMY  1975  . WIDE LOCAL EXCISION OF FOURCHETTE AND PERINEUM  04-06-2009   VULVAR CARCINOMA IN SITU    There were no vitals filed for this visit.      Subjective Assessment - 01/17/17 0839    Subjective feeling well this morning   Pertinent History Tuberous sclerosis, B THA   Patient Stated Goals improve pain and function   Currently in Pain? Yes   Pain Score 2    Pain Location Knee   Pain Orientation Right;Left   Pain Descriptors / Indicators Sore                         [facility] Adult PT Treatment/Exercise - 01/17/17 0841      Knee/Hip Exercises: Aerobic   Nustep L5 x 7 min     Knee/Hip Exercises: Machines for Strengthening   Cybex Knee Extension 20# B LE x 15   Cybex Knee Flexion 20# B LE x 15     Knee/Hip Exercises: Standing   Functional Squat 15 reps   Functional Squat Limitations TRX     Knee/Hip Exercises: Seated   Long Arc Quad Left;15 reps   Long Arc Quad Weight 2 lbs.   Long [employer] Limitations 5 sec hold; with ball squeeze     Knee/Hip Exercises: Supine   Bridges with [employer] Both;15 reps   Straight Leg Raises Left;15 reps   Straight Leg Raises Limitations 2#   Straight Leg Raise with External Rotation Left;15 reps     Manual Therapy   Manual Therapy Joint mobilization;Soft tissue mobilization   Joint Mobilization R patellar mobs - all planes   Soft tissue mobilization roller stick to B anterior thighs                  PT Short Term Goals - 01/16/17 1616      PT SHORT TERM GOAL #1   Title patient to be independent with initial HEP   Status On-going           PT Long Term Goals - 01/16/17 1616      PT LONG TERM GOAL #1   Title patient to be independent  with advanced HEP   Status On-going     PT LONG TERM GOAL #2   Title Patient to improve gross L LE strength to >/= 4+/5   Status On-going     PT LONG TERM GOAL #3   Title patient to demonstrate ability ot ascend/descend up 1 flight of stairs with reciprocal gait pattern and single rail   Status On-going     PT LONG TERM  GOAL #4   Title patient to report ability to stand/walk >/= 30 min at a time without pain being a limiting factor   Status On-going               Plan - 01/17/17 0839    Clinical Impression Statement Patient feeling well from yesterdays session - L patella feels better, R is sore today - relief with patellar mobs in session. Good progression of all strengthening with updated HEP. Will continue to progresstowards goals.   PT Treatment/Interventions ADLs/Self Care Home Management;Cryotherapy;Electrical Stimulation;Iontophoresis 4mg /ml Dexamethasone;Moist Heat;Ultrasound;Neuromuscular re-education;Balance training;Therapeutic exercise;Therapeutic activities;Functional mobility training;Gait training;Stair training;Patient/family education;Manual techniques;Passive range of motion;Vasopneumatic Device;Taping;Dry needling   Consulted and Agree with Plan of Care Patient      Patient will benefit from skilled therapeutic intervention in order to improve the following deficits and impairments:  Abnormal gait, Decreased activity tolerance, Decreased mobility, Decreased strength, Difficulty walking, Pain  Visit Diagnosis: Chronic pain of left knee  Difficulty in walking, not elsewhere classified  Other symptoms and signs involving the musculoskeletal system     Problem List Patient Active Problem List   Diagnosis Date Noted  . Status post arthroscopy of left knee 10/24/2016  . Acute pyelonephritis 08/18/2016  . Chest pressure 08/18/2016  . Essential hypertension 08/18/2016  . Hyponatremia 08/18/2016  . Diastolic dysfunction 16/03/9603  . AKI (acute kidney  injury) ([place]) 08/18/2016  . Sepsis ([place])   . Acute lower UTI   . Tachycardia   . Osteoarthritis of right hip 02/17/2015  . 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 Atrial tachycardia ([place]) 12/12/2014  . 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 Recurrent dislocation of hip 10/17/2014  . Dislocation of internal left hip prosthesis ([place]) 10/16/2014  . Ectopic atrial rhythm 10/12/2014  . Atrial bigeminy 10/12/2014  . Osteoarthritis of left hip 09/16/2014  . Status post total replacement of left hip 09/16/2014  . ASCUS favoring benign 09/05/2014  . History of vitamin B deficiency 05/02/2014  . Acute bronchitis 04/18/2014  . Oral aphthous ulcer 04/18/2014  . Edema 12/21/2013  . Irregular heart beat 12/21/2013  . Anuria 11/12/2013  . History of cervical dysplasia 09/21/2013  . Rectocele, female 09/21/2013  . Kidney stone 09/21/2013  . Rash and nonspecific skin eruption 06/25/2013  . Obesity (BMI 30-39.9) 06/16/2013  . IBS (irritable bowel syndrome) 12/22/2012  . Other sleep disturbances 10/27/2012  . Elevated BP 09/29/2012  . Weight gain 08/17/2012  . Osteopenia 08/17/2012  . Vulvar lesion 08/10/2012  . 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 Condyloma acuminatum in female 07/14/2012  . Shingles 07/14/2012  . Vitamin D deficiency 07/14/2012     Lanney Gins, PT, DPT 01/17/17 9:19 AM   [facility] [address]  [place], [place], [address] Phone: [phone]   Fax:  [phone]  Name: HAILI DONOFRIO MRN: [id number] Date of Birth: 1946-04-26

## 2017-01-19 IMAGING — RF DG C-ARM 1-60 MIN-NO REPORT
1 series · 2 of 2 positions shown · non-contrast
Comparison: None.

CLINICAL DATA: Status post total hip replacement

EXAM:
DG C-ARM 1-60 MIN - NRPT [facility]; LEFT HIP 1 VIEW

[Series 1: run · 2 of 2 slices shown]
[im 1/2]
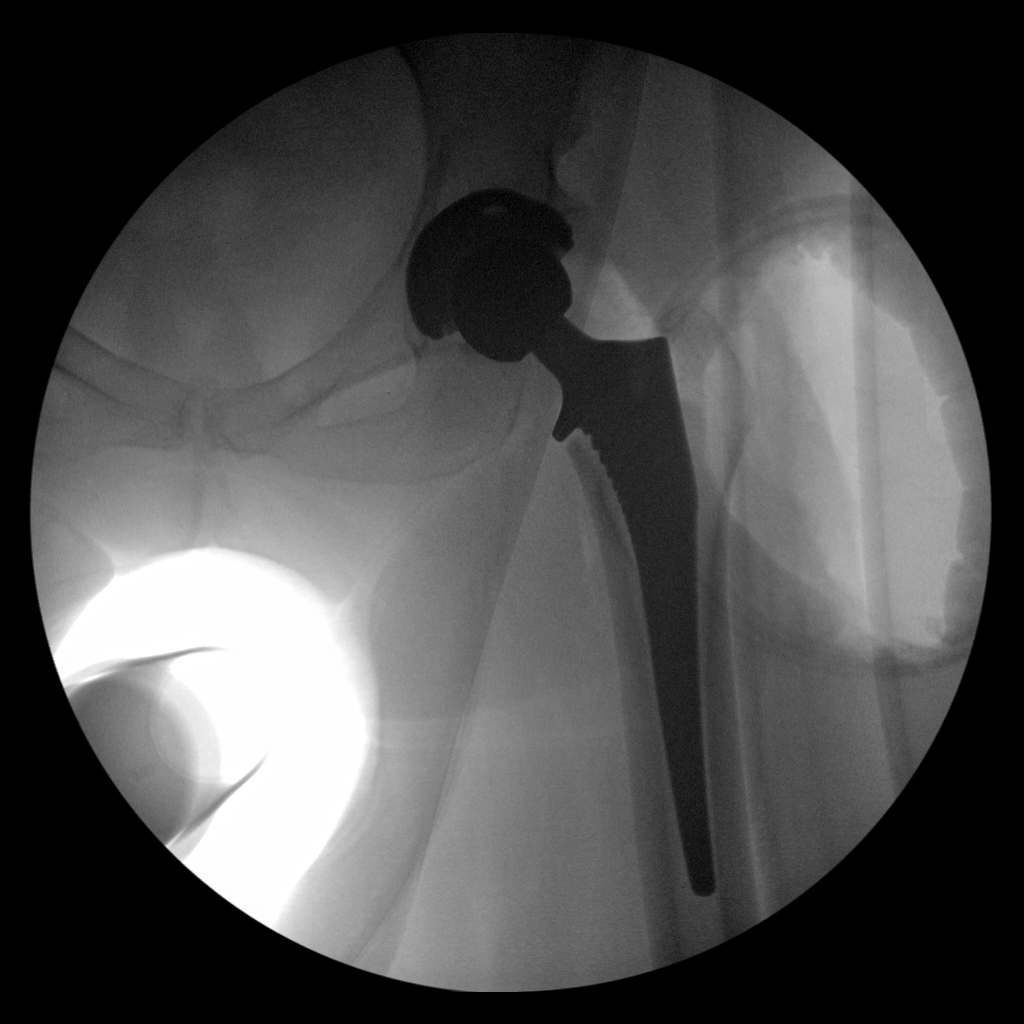
[im 2/2]
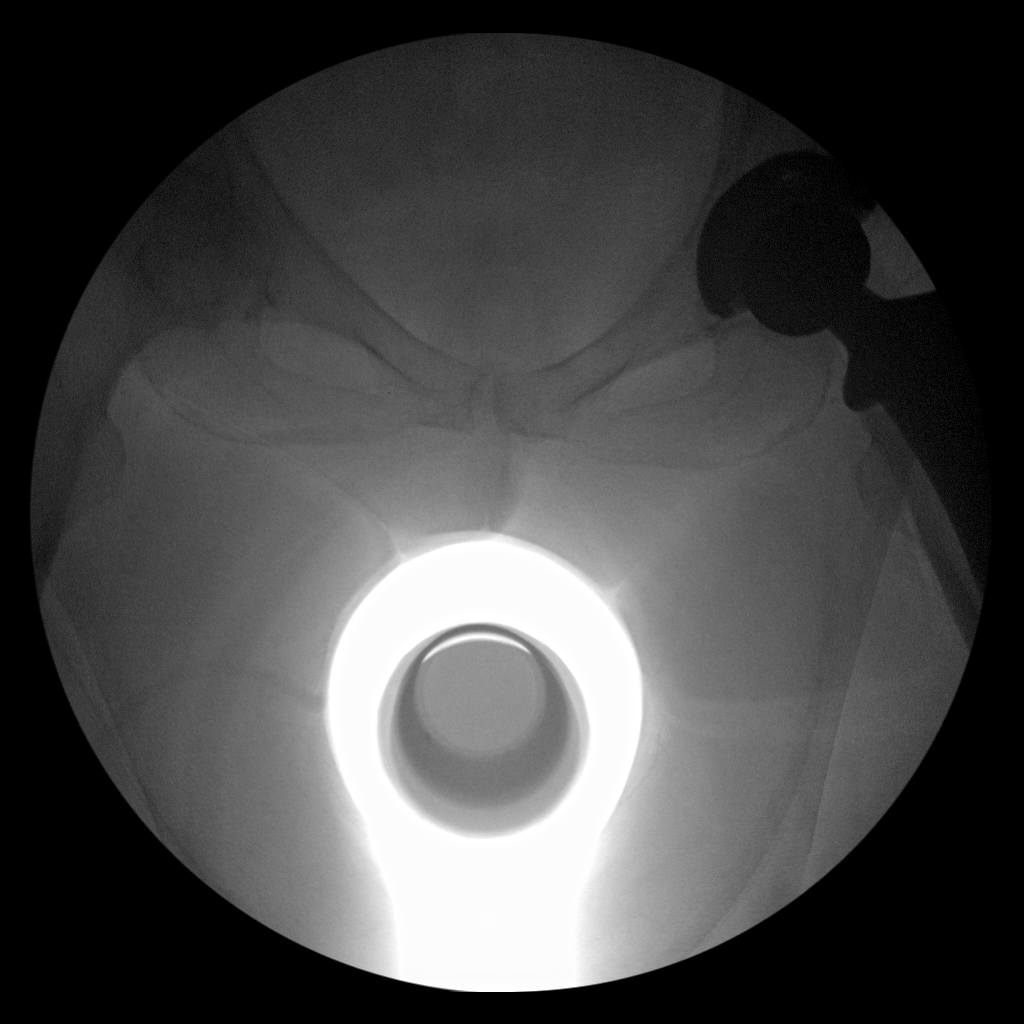

[2 of 2 positions shown; findings below may reference images not displayed]

FLUOROSCOPY TIME:  Fluoroscopy Time:  0 min 28 seconds

Number of Acquired Images:  2
FINDINGS: There is a total hip replacement with the prosthetic components
appearing well-seated on frontal view. No fracture or dislocation.
IMPRESSION: Prosthetic components appear well seated on frontal view. No
fracture or dislocation.

## 2017-01-19 IMAGING — DX DG HIP (WITH OR WITHOUT PELVIS) 1V PORT*L*
2 series · 2 of 2 positions shown · non-contrast
Comparison: Intraoperative imaging same date

CLINICAL DATA: Postoperative evaluation after left total hip
arthroplasty

EXAM:
LEFT HIP (WITH PELVIS) 1 VIEW PORTABLE

[pelvis ap]
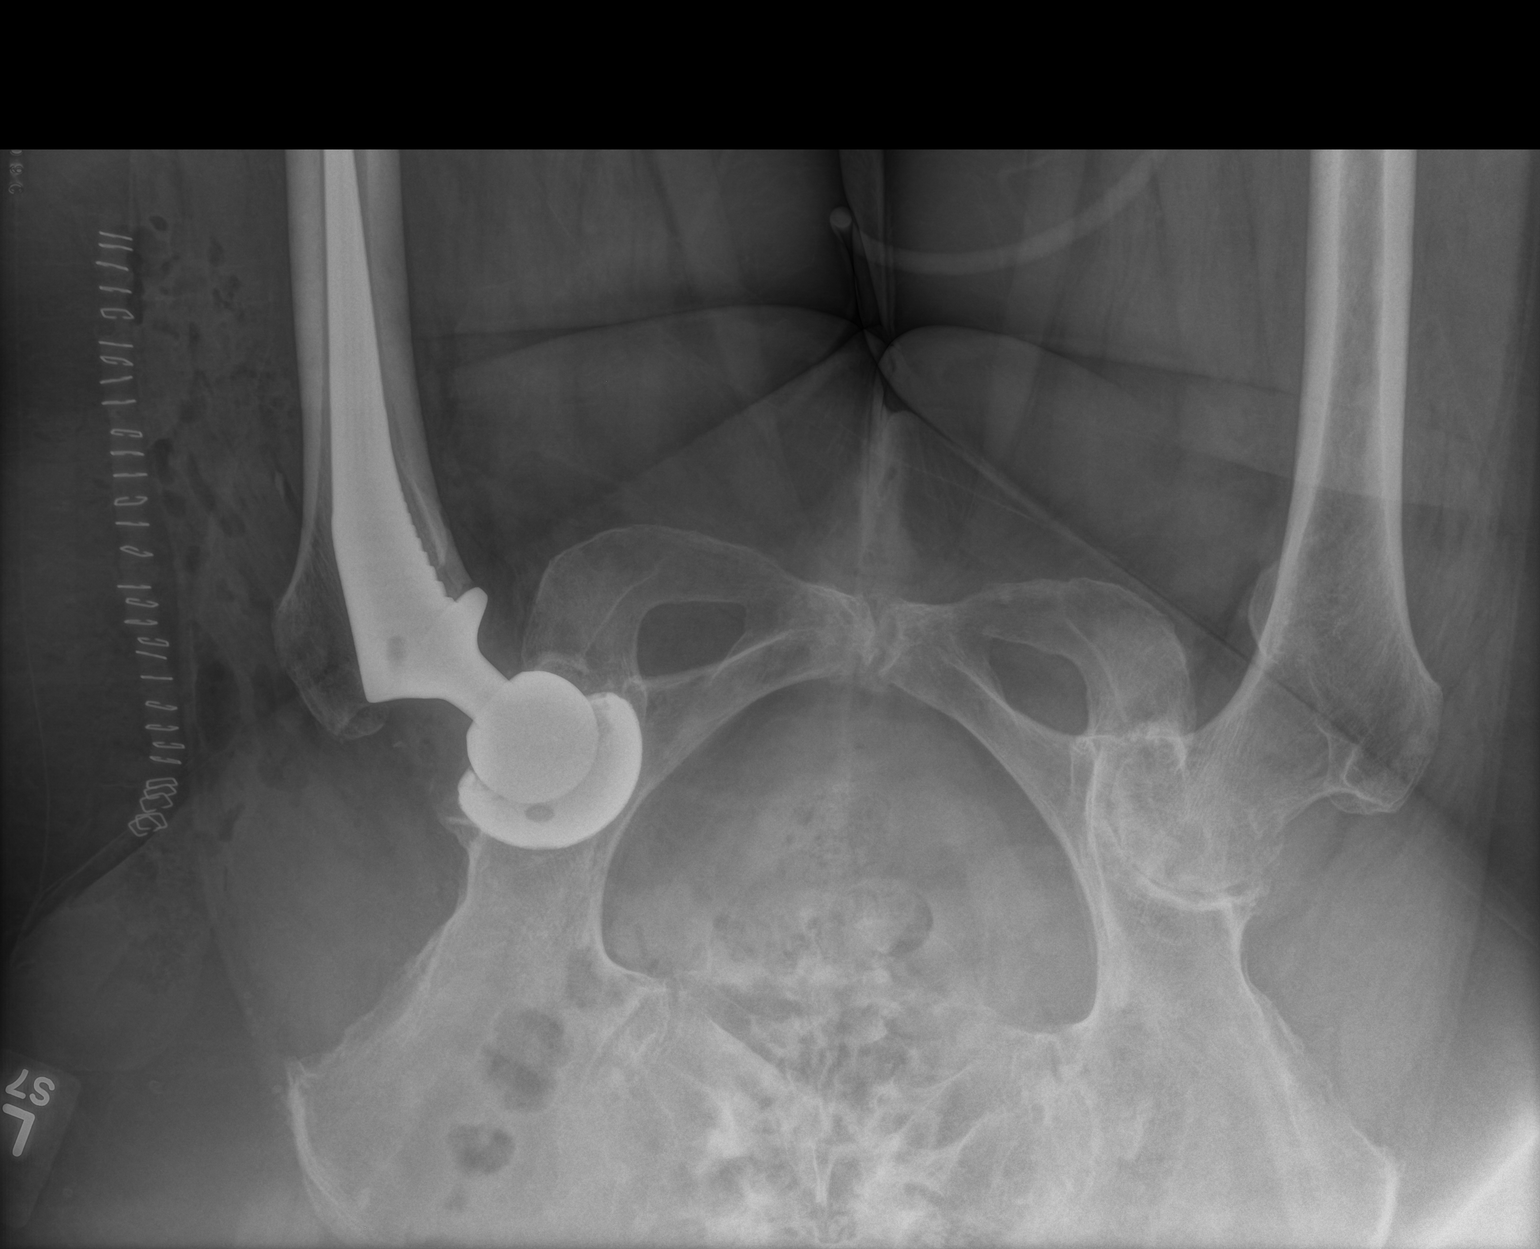

[hip x-table]
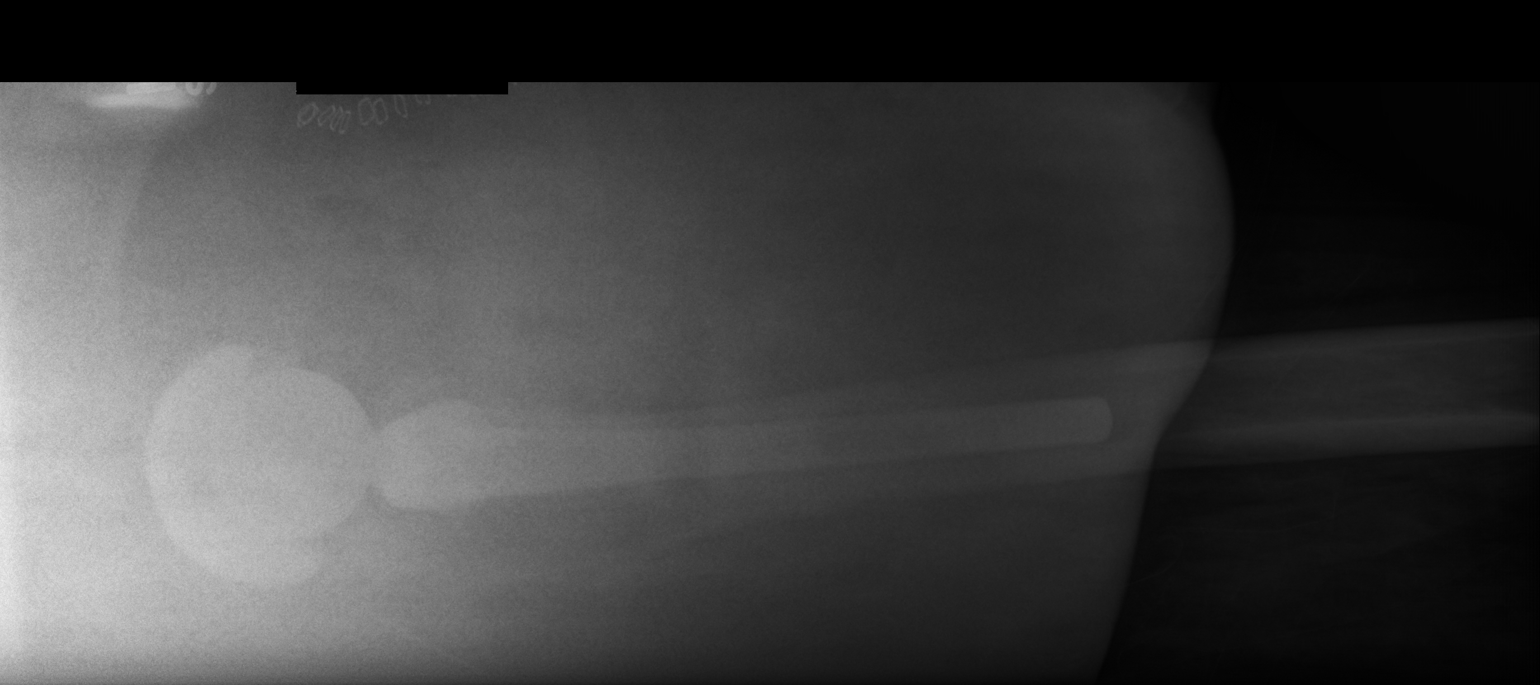

[2 of 2 positions shown; findings below may reference images not displayed]

FINDINGS: Expected postoperative appearance after left total hip arthroplasty.
No fracture line or evidence for hardware failure is identified.
Overlying skin staples and soft tissue gas are identified. Moderate
right hip degenerative change also noted.
IMPRESSION: Expected postoperative appearance after total hip arthroplasty.

## 2017-01-24 ENCOUNTER — Ambulatory Visit: Payer: Medicare Other

## 2017-01-29 ENCOUNTER — Ambulatory Visit: Payer: Medicare Other | Admitting: Physical Therapy

## 2017-01-29 DIAGNOSIS — M25562 Pain in left knee: Secondary | ICD-10-CM | POA: Diagnosis not present

## 2017-01-29 DIAGNOSIS — G8929 Other chronic pain: Secondary | ICD-10-CM

## 2017-01-29 DIAGNOSIS — R29898 Other symptoms and signs involving the musculoskeletal system: Secondary | ICD-10-CM | POA: Diagnosis not present

## 2017-01-29 DIAGNOSIS — R262 Difficulty in walking, not elsewhere classified: Secondary | ICD-10-CM | POA: Diagnosis not present

## 2017-01-29 NOTE — Therapy (Signed)
[facility] [address]  [place], [place], [address] Phone: [phone]   Fax:  [phone]  Physical Therapy Treatment  Patient Details  Name: [name] MRN: [id number] Date of Birth: 10-09-1945 Referring Provider: Dr. Jean Rosenthal  Encounter Date: 01/29/2017      PT End of Session - 01/29/17 1735    Visit Number 4   Number of Visits 12   Date for PT Re-Evaluation 02/20/17   Authorization Type Medicare   PT Start Time 1620   PT Stop Time 1701   PT Time Calculation (min) 41 min   Activity Tolerance Patient tolerated treatment well   Behavior During Therapy [facility] for tasks assessed/performed      Past Medical History:  Diagnosis Date  . Arthritis   . DDD (degenerative disc disease), lumbosacral   . Diverticulosis   . Falls    hx of   . GERD (gastroesophageal reflux disease)   . H/O hiatal hernia   . History of diverticulitis of colon   . History of epilepsy    childhood age 39 to 67  ---secondary to high calcium deposts of optic nerve---  none since age 75 with pregency  . History of gastric ulcer   . History of kidney stones   . History of shingles    MARCH 2014--  BACK AREA  . History of vulvar dysplasia    S/P  LASER ABLATION 2014  . IBS (irritable bowel syndrome)   . Mild obstructive sleep apnea    SLEEP STUDY  11-19-2012--  NO CPAP RX (DID NOT MEET CRITIRIA)  . Nodule of right lung    BENIGN--- AND STABLE PER  CT  11/2013  . Osteoporosis   . Renal cyst, left    STABLE PER CT 11/2013  . Seizures ([place])   . 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 ANTERIOR HIP REVISION Left 10/18/2014   Procedure: EXCHANGE LEFT HIP BALL OF DIRECT ANTERIOR TOTAL HIP ARTHROPLASTY;  Surgeon: Mcarthur Rossetti, MD;  Location: [place];  Service: Orthopedics;   Laterality: Left;  . AUGMENTATION MAMMAPLASTY     SALINE  . BLADDER SURGERY  1991   bladder reconstruction of torsion  . CARDIAC CATHETERIZATION  06-26-2001   NORMAL CORONARY ARTERIES  . CATARACT EXTRACTION W/ INTRAOCULAR LENS  IMPLANT, BILATERAL    . CHOLECYSTECTOMY  11-18-2003  . CO2 LASER APPLICATION N/A 07/12/5186   Procedure: CO2 LASER APPLICATION;  Surgeon: Terrance Mass, MD;  Location: [facility]  Service: Gynecology;  Laterality: N/A;CO2 laser of vaginal and vulvar lesions  . COLONOSCOPY    . CYSTOSCOPY WITH RETROGRADE PYELOGRAM, URETEROSCOPY AND STENT PLACEMENT Right 11/22/2013   Procedure: CYSTOSCOPY WITH RETROGRADE PYELOGRAM, URETEROSCOPY AND STENT PLACEMENT;  Surgeon: Sharyn Creamer, MD;  Location: [facility];  Service: Urology;  Laterality: Right;  . CYSTOSCOPY/RETROGRADE/URETEROSCOPY Right 12/01/2013   Procedure: CYSTOSCOPY, RIGHT RETROGRADE/RIGHT DIGITAL URETEROSCOPY, RIGHT URETERAL STENT EXCHANGE;  Surgeon: Sharyn Creamer, MD;  Location: [facility];  Service: Urology;  Laterality: Right;  STAGED RIGHT URETEROSCOPY RIGHT URETER DILATION    . DILATION AND CURETTAGE OF UTERUS  multiple -- last one 1975  . GYNECOLOGIC CRYOSURGERY    . HEMORRHOID SURGERY  1970's  . HIP CLOSED REDUCTION Left 10/16/2014   Procedure: CLOSED  MANIPULATION HIP;  Surgeon: Mcarthur Rossetti, MD;  Location[facility];  Service: Orthopedics;  Laterality: Left;  . HOLMIUM LASER APPLICATION Right 08/03/6710   Procedure: HOLMIUM LASER APPLICATION;  Surgeon: Sharyn Creamer, MD;  Location: [facility];  Service: Urology;  Laterality: Right;  . KNEE SURGERY  11/2016  . [place] SURGERY  1985  . ORIF RIGHT BIMALLEOLAR ANKLE FX  12-18-2003  . TOTAL HIP ARTHROPLASTY Left 09/16/2014   Procedure: LEFT TOTAL HIP ARTHROPLASTY ANTERIOR APPROACH;  Surgeon: Mcarthur Rossetti, MD;  Location: [facility];  Service: Orthopedics;  Laterality: Left;   . TOTAL HIP ARTHROPLASTY Right 02/17/2015   Procedure: RIGHT TOTAL HIP ARTHROPLASTY ANTERIOR APPROACH;  Surgeon: Mcarthur Rossetti, MD;  Location[facility];  Service: Orthopedics;  Laterality: Right;  . UPPER GASTROINTESTINAL ENDOSCOPY    . VAGINAL HYSTERECTOMY  1975  . WIDE LOCAL EXCISION OF FOURCHETTE AND PERINEUM  04-06-2009   VULVAR CARCINOMA IN SITU    There were no vitals filed for this visit.      Subjective Assessment - 01/29/17 1621    Subjective reports pain of B LE - knee down - with all activities.   Pertinent History Tuberous sclerosis, B THA   Patient Stated Goals improve pain and function   Currently in Pain? Yes   Pain Score 2    Pain Location Knee   Pain Orientation Left;Right   Pain Descriptors / Indicators Aching;Constant   Pain Type Chronic pain                         [facility] Adult PT Treatment/Exercise - 01/29/17 0001      Knee/Hip Exercises: Aerobic   Nustep L5 x 6 min     Knee/Hip Exercises: Machines for Strengthening   Cybex Knee Extension 25# B LE x 15     Knee/Hip Exercises: Standing   Forward Step Up Left;15 reps;Hand Hold: 1;Step Height: 6"   Wall Squat 15 reps;3 seconds   Wall Squat Limitations mini     Knee/Hip Exercises: Seated   Long Arc Quad Right;Left;15 reps   Long Arc Quad Limitations 5 sec hold; with ball squeeze     Manual Therapy   Manual Therapy Joint mobilization;Soft tissue mobilization   Joint Mobilization L patellar mobs - all planes   Soft tissue mobilization STM to anterior and medial thigh - TTP throughout          Trigger Point Dry Needling - 01/29/17 1730    Consent Given? Yes   Education Handout Provided Yes   Muscles Treated Lower Body Quadriceps   Quadriceps Response Twitch response elicited;Palpable increased muscle length                PT Short Term Goals - 01/16/17 1616      PT SHORT TERM GOAL #1   Title patient to be independent with initial HEP   Status On-going            PT Long Term Goals - 01/16/17 1616      PT LONG TERM GOAL #1   Title patient to be independent with advanced HEP   Status On-going     PT LONG TERM GOAL #2   Title Patient to improve gross L LE strength to >/= 4+/5   Status On-going     PT LONG TERM GOAL #3   Title patient to demonstrate ability ot ascend/descend up 1 flight of stairs with reciprocal gait  pattern and single rail   Status On-going     PT LONG TERM GOAL #4   Title patient to report ability to stand/walk >/= 30 min at a time without pain being a limiting factor   Status On-going               Plan - 01/29/17 1736    Clinical Impression Statement Patient today reporting a "pop" in her knee this week with associated pain - no known mechanism. Patient walking well within clinic today and able to perform activities with minimal pain. Upon palpation, patient with some tenderness to distal quad and adductor muscle group with palpable tissue tightness. DN treatment discussed with patient with patient giving verbal consent to treatment. Good repsonse with treatment and hopeful of some pain resolution. Will continue to progress general hip and knee strength to facilitate improved functional mobility.    PT Treatment/Interventions ADLs/Self Care Home Management;Cryotherapy;Electrical Stimulation;Iontophoresis 4mg /ml Dexamethasone;Moist Heat;Ultrasound;Neuromuscular re-education;Balance training;Therapeutic exercise;Therapeutic activities;Functional mobility training;Gait training;Stair training;Patient/family education;Manual techniques;Passive range of motion;Vasopneumatic Device;Taping;Dry needling   Consulted and Agree with Plan of Care Patient      Patient will benefit from skilled therapeutic intervention in order to improve the following deficits and impairments:  Abnormal gait, Decreased activity tolerance, Decreased mobility, Decreased strength, Difficulty walking, Pain  Visit Diagnosis: Chronic pain of  left knee  Difficulty in walking, not elsewhere classified  Other symptoms and signs involving the musculoskeletal system     Problem List Patient Active Problem List   Diagnosis Date Noted  . Status post arthroscopy of left knee 10/24/2016  . Acute pyelonephritis 08/18/2016  . Chest pressure 08/18/2016  . Essential hypertension 08/18/2016  . Hyponatremia 08/18/2016  . Diastolic dysfunction 16/38/4536  . AKI (acute kidney injury) ([place]) 08/18/2016  . Sepsis ([place])   . Acute lower UTI   . Tachycardia   . Osteoarthritis of right hip 02/17/2015  . 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 Atrial tachycardia ([place]) 12/12/2014  . 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 Recurrent dislocation of hip 10/17/2014  . Dislocation of internal left hip prosthesis ([place]) 10/16/2014  . Ectopic atrial rhythm 10/12/2014  . Atrial bigeminy 10/12/2014  . Osteoarthritis of left hip 09/16/2014  . Status post total replacement of left hip 09/16/2014  . ASCUS favoring benign 09/05/2014  . History of vitamin B deficiency 05/02/2014  . Acute bronchitis 04/18/2014  . Oral aphthous ulcer 04/18/2014  . Edema 12/21/2013  . Irregular heart beat 12/21/2013  . Anuria 11/12/2013  . History of cervical dysplasia 09/21/2013  . Rectocele, female 09/21/2013  . Kidney stone 09/21/2013  . Rash and nonspecific skin eruption 06/25/2013  . Obesity (BMI 30-39.9) 06/16/2013  . IBS (irritable bowel syndrome) 12/22/2012  . Other sleep disturbances 10/27/2012  . Elevated BP 09/29/2012  . Weight gain 08/17/2012  . Osteopenia 08/17/2012  . Vulvar lesion 08/10/2012  . 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 Condyloma acuminatum in female 07/14/2012  . Shingles 07/14/2012  . Vitamin D deficiency 07/14/2012     Lanney Gins, PT, DPT 01/29/17 5:41 PM   [facility] [address]  [place], [place],  [address] Phone: [phone]   Fax:  [phone]  Name: ROZLYN YERBY MRN: [id number] Date of Birth: [birth year]

## 2017-01-29 NOTE — Patient Instructions (Signed)

## 2017-01-30 ENCOUNTER — Ambulatory Visit (INDEPENDENT_AMBULATORY_CARE_PROVIDER_SITE_OTHER): Payer: Medicare Other | Admitting: Orthopaedic Surgery

## 2017-01-30 DIAGNOSIS — N200 Calculus of kidney: Secondary | ICD-10-CM | POA: Diagnosis not present

## 2017-01-30 DIAGNOSIS — K219 Gastro-esophageal reflux disease without esophagitis: Secondary | ICD-10-CM | POA: Diagnosis not present

## 2017-01-30 DIAGNOSIS — Z9889 Other specified postprocedural states: Secondary | ICD-10-CM

## 2017-01-30 DIAGNOSIS — I4891 Unspecified atrial fibrillation: Secondary | ICD-10-CM | POA: Diagnosis not present

## 2017-01-30 DIAGNOSIS — M810 Age-related osteoporosis without current pathological fracture: Secondary | ICD-10-CM | POA: Diagnosis not present

## 2017-01-30 DIAGNOSIS — M79661 Pain in right lower leg: Secondary | ICD-10-CM | POA: Diagnosis not present

## 2017-01-30 NOTE — Progress Notes (Signed)
The patient is now gone through extensive physical therapy on her left knee that was performed, surgery on earlier this year. Between physical therapy in the steroid injection she is feeling much better overall. She said her pain is now minimal she does  seem to be getting stronger. She also has a history of bilateral hip replacements.  On examination of her left knee has excellent range of motion. He does not feel less stiff. There is no effusion. His nice and cool. He feels ligamentously stable.  This point she'll continue physical therapy until she feels that she is less stiff getting better in terms of mobility overall. She will still off on doing anything for her right shoulder until she is completely over her knee and I agree with this as well. See her back for final visit in 4 weeks.

## 2017-01-31 ENCOUNTER — Ambulatory Visit: Payer: Medicare Other

## 2017-01-31 DIAGNOSIS — G8929 Other chronic pain: Secondary | ICD-10-CM

## 2017-01-31 DIAGNOSIS — R262 Difficulty in walking, not elsewhere classified: Secondary | ICD-10-CM | POA: Diagnosis not present

## 2017-01-31 DIAGNOSIS — R29898 Other symptoms and signs involving the musculoskeletal system: Secondary | ICD-10-CM | POA: Diagnosis not present

## 2017-01-31 DIAGNOSIS — M25562 Pain in left knee: Principal | ICD-10-CM

## 2017-01-31 NOTE — Therapy (Signed)
[facility] [address]  [place], [place], [address] Phone: [phone]   Fax:  [phone]  Physical Therapy Treatment  Patient Details  Name: [name] MRN: [id number] Date of Birth: 1945/06/24 Referring Provider: Dr. Jean Rosenthal  Encounter Date: 01/31/2017      PT End of Session - 01/31/17 0854    Visit Number 5   Number of Visits 12   Date for PT Re-Evaluation 02/20/17   Authorization Type Medicare   PT Start Time 0846   PT Stop Time 0928   PT Time Calculation (min) 42 min   Activity Tolerance Patient tolerated treatment well   Behavior During Therapy [facility] for tasks assessed/performed      Past Medical History:  Diagnosis Date  . Arthritis   . DDD (degenerative disc disease), lumbosacral   . Diverticulosis   . Falls    hx of   . GERD (gastroesophageal reflux disease)   . H/O hiatal hernia   . History of diverticulitis of colon   . History of epilepsy    childhood age 71 to 44  ---secondary to high calcium deposts of optic nerve---  none since age 3 with pregency  . History of gastric ulcer   . History of kidney stones   . History of shingles    MARCH 2014--  BACK AREA  . History of vulvar dysplasia    S/P  LASER ABLATION 2014  . IBS (irritable bowel syndrome)   . Mild obstructive sleep apnea    SLEEP STUDY  11-19-2012--  NO CPAP RX (DID NOT MEET CRITIRIA)  . Nodule of right lung    BENIGN--- AND STABLE PER  CT  11/2013  . Osteoporosis   . Renal cyst, left    STABLE PER CT 11/2013  . Seizures ([place])   . 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 ANTERIOR HIP REVISION Left 10/18/2014   Procedure: EXCHANGE LEFT HIP BALL OF DIRECT ANTERIOR TOTAL HIP ARTHROPLASTY;  Surgeon: Mcarthur Rossetti, MD;  Location: [place];  Service: Orthopedics;   Laterality: Left;  . AUGMENTATION MAMMAPLASTY     SALINE  . BLADDER SURGERY  1991   bladder reconstruction of torsion  . CARDIAC CATHETERIZATION  06-26-2001   NORMAL CORONARY ARTERIES  . CATARACT EXTRACTION W/ INTRAOCULAR LENS  IMPLANT, BILATERAL    . CHOLECYSTECTOMY  11-18-2003  . CO2 LASER APPLICATION N/A 02/06/1883   Procedure: CO2 LASER APPLICATION;  Surgeon: Terrance Mass, MD;  Location: [facility];  Service: Gynecology;  Laterality: N/A;CO2 laser of vaginal and vulvar lesions  . COLONOSCOPY    . CYSTOSCOPY WITH RETROGRADE PYELOGRAM, URETEROSCOPY AND STENT PLACEMENT Right 11/22/2013   Procedure: CYSTOSCOPY WITH RETROGRADE PYELOGRAM, URETEROSCOPY AND STENT PLACEMENT;  Surgeon: Sharyn Creamer, MD;  Location: [facility];  Service: Urology;  Laterality: Right;  . CYSTOSCOPY/RETROGRADE/URETEROSCOPY Right 12/01/2013   Procedure: CYSTOSCOPY, RIGHT RETROGRADE/RIGHT DIGITAL URETEROSCOPY, RIGHT URETERAL STENT EXCHANGE;  Surgeon: Sharyn Creamer, MD;  Location: [facility];  Service: Urology;  Laterality: Right;  STAGED RIGHT URETEROSCOPY RIGHT URETER DILATION    . DILATION AND CURETTAGE OF UTERUS  multiple -- last one 1975  . GYNECOLOGIC CRYOSURGERY    . HEMORRHOID SURGERY  1970's  . HIP CLOSED REDUCTION Left 10/16/2014   Procedure: CLOSED  MANIPULATION HIP;  Surgeon: Mcarthur Rossetti, MD;  Location: [facility];  Service: Orthopedics;  Laterality: Left;  . HOLMIUM LASER APPLICATION Right 4/0/9811   Procedure: HOLMIUM LASER APPLICATION;  Surgeon: Sharyn Creamer, MD;  Location: [facility];  Service: Urology;  Laterality: Right;  . KNEE SURGERY  11/2016  . [place] SURGERY  1985  . ORIF RIGHT BIMALLEOLAR ANKLE FX  12-18-2003  . TOTAL HIP ARTHROPLASTY Left 09/16/2014   Procedure: LEFT TOTAL HIP ARTHROPLASTY ANTERIOR APPROACH;  Surgeon: Mcarthur Rossetti, MD;  Location: [facility];  Service: Orthopedics;  Laterality: Left;   . TOTAL HIP ARTHROPLASTY Right 02/17/2015   Procedure: RIGHT TOTAL HIP ARTHROPLASTY ANTERIOR APPROACH;  Surgeon: Mcarthur Rossetti, MD;  Location[facility];  Service: Orthopedics;  Laterality: Right;  . UPPER GASTROINTESTINAL ENDOSCOPY    . VAGINAL HYSTERECTOMY  1975  . WIDE LOCAL EXCISION OF FOURCHETTE AND PERINEUM  04-06-2009   VULVAR CARCINOMA IN SITU    There were no vitals filed for this visit.      Subjective Assessment - 01/31/17 0848    Subjective Reports MD appointment went well yesterday.  Pt. reporting MD wants continued strengthening with PT.  Pt. notes benefit from DN and would be interested in continuing this.     Patient Stated Goals improve pain and function   Currently in Pain? Yes   Pain Score 6    Pain Location Knee   Pain Orientation Right   Pain Descriptors / Indicators Dull;Sharp  "dull in some spots and sharp in other spots"   Pain Type Chronic pain   Pain Onset More than a month ago   Pain Frequency Constant   Aggravating Factors  walking   Multiple Pain Sites No                         [facility] Adult PT Treatment/Exercise - 01/31/17 0857      Knee/Hip Exercises: Aerobic   Stationary Bike Lvl 1, 6 min      Knee/Hip Exercises: Standing   Lateral Step Up 10 reps;Step Height: 6";Right;Left;Hand Hold: 2   Lateral Step Up Limitations 2 ski poles    Forward Step Up Left;15 reps;Hand Hold: 1;Step Height: 6"   Forward Step Up Limitations 1 ski pole    Wall Squat 15 reps;3 seconds   Wall Squat Limitations leaning on orange p-ball; mini   Other Standing Knee Exercises side step with yellow looped TB at ankles at counter x 2 laps down/back      Knee/Hip Exercises: Seated   Long Arc Quad Right;Left;15 reps   Long Arc Quad Weight 1 lbs.   Long [employer] Limitations 5 sec hold; with ball squeeze   Other Seated Knee/Hip Exercises B fitter leg press (2 blue bands) x 20 reps   Hamstring Curl Left;10 reps   Hamstring Limitations green TB      Manual Therapy   Manual Therapy Joint mobilization   Joint Mobilization L patellar mobs - all planes                  PT Short Term Goals - 01/16/17 1616      PT SHORT TERM GOAL #1   Title patient to be independent with initial HEP   Status On-going           PT Long Term Goals - 01/16/17 1616      PT LONG TERM GOAL #1   Title patient  to be independent with advanced HEP   Status On-going     PT LONG TERM GOAL #2   Title Patient to improve gross L LE strength to >/= 4+/5   Status On-going     PT LONG TERM GOAL #3   Title patient to demonstrate ability ot ascend/descend up 1 flight of stairs with reciprocal gait pattern and single rail   Status On-going     PT LONG TERM GOAL #4   Title patient to report ability to stand/walk >/= 30 min at a time without pain being a limiting factor   Status On-going               Plan - 01/31/17 0859    Clinical Impression Statement Jenny Reichmann doing well today noting benefit from DN and would be interested in this in future.  Tolerated addition of seated fitter and lateral step up well today.  Some progression in seated strengthening activities.  Patellar mobility still limited today.  Pt. would benefit from further skilled therapy to improve functional strength and ROM.   PT Treatment/Interventions ADLs/Self Care Home Management;Cryotherapy;Electrical Stimulation;Iontophoresis 4mg /ml Dexamethasone;Moist Heat;Ultrasound;Neuromuscular re-education;Balance training;Therapeutic exercise;Therapeutic activities;Functional mobility training;Gait training;Stair training;Patient/family education;Manual techniques;Passive range of motion;Vasopneumatic Device;Taping;Dry needling      Patient will benefit from skilled therapeutic intervention in order to improve the following deficits and impairments:  Abnormal gait, Decreased activity tolerance, Decreased mobility, Decreased strength, Difficulty walking, Pain  Visit Diagnosis: Chronic  pain of left knee  Difficulty in walking, not elsewhere classified  Other symptoms and signs involving the musculoskeletal system     Problem List Patient Active Problem List   Diagnosis Date Noted  . Status post arthroscopy of left knee 10/24/2016  . Acute pyelonephritis 08/18/2016  . Chest pressure 08/18/2016  . Essential hypertension 08/18/2016  . Hyponatremia 08/18/2016  . Diastolic dysfunction 09/32/3557  . AKI (acute kidney injury) ([place]) 08/18/2016  . Sepsis ([place])   . Acute lower UTI   . Tachycardia   . Osteoarthritis of right hip 02/17/2015  . 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 Atrial tachycardia ([place]) 12/12/2014  . 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 Recurrent dislocation of hip 10/17/2014  . Dislocation of internal left hip prosthesis ([place]) 10/16/2014  . Ectopic atrial rhythm 10/12/2014  . Atrial bigeminy 10/12/2014  . Osteoarthritis of left hip 09/16/2014  . Status post total replacement of left hip 09/16/2014  . ASCUS favoring benign 09/05/2014  . History of vitamin B deficiency 05/02/2014  . Acute bronchitis 04/18/2014  . Oral aphthous ulcer 04/18/2014  . Edema 12/21/2013  . Irregular heart beat 12/21/2013  . Anuria 11/12/2013  . History of cervical dysplasia 09/21/2013  . Rectocele, female 09/21/2013  . Kidney stone 09/21/2013  . Rash and nonspecific skin eruption 06/25/2013  . Obesity (BMI 30-39.9) 06/16/2013  . IBS (irritable bowel syndrome) 12/22/2012  . Other sleep disturbances 10/27/2012  . Elevated BP 09/29/2012  . Weight gain 08/17/2012  . Osteopenia 08/17/2012  . Vulvar lesion 08/10/2012  . 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 Condyloma acuminatum in female 07/14/2012  . Shingles 07/14/2012  . Vitamin D deficiency 07/14/2012    Bess Harvest, PTA 01/31/17 12:37 PM  [facility] [address]  [place], [place],  [address] Phone: [phone]   Fax:  [phone]  Name: AMILLION SCOBEE MRN: [id number] Date of Birth: 1946-01-05

## 2017-02-06 ENCOUNTER — Ambulatory Visit: Payer: Medicare Other | Admitting: Physical Therapy

## 2017-02-07 ENCOUNTER — Ambulatory Visit: Payer: Medicare Other | Admitting: Physical Therapy

## 2017-02-12 ENCOUNTER — Ambulatory Visit: Payer: Medicare Other | Admitting: Physical Therapy

## 2017-02-13 ENCOUNTER — Encounter: Payer: Medicare Other | Admitting: Obstetrics & Gynecology

## 2017-02-14 ENCOUNTER — Ambulatory Visit: Payer: Medicare Other | Admitting: Physical Therapy

## 2017-02-17 ENCOUNTER — Telehealth: Payer: Self-pay

## 2017-02-17 NOTE — Telephone Encounter (Signed)
Called to ask what dose of Vit D prescription strength she took.  Advised 50,000 units.

## 2017-02-18 IMAGING — CR DG HIP (WITH OR WITHOUT PELVIS) 2-3V*L*
3 series · 3 of 3 positions shown · non-contrast
Comparison: 09/16/2014

CLINICAL DATA: Left hip pain today. Left hip surgery 4 weeks ago.
Initial encounter.

EXAM:
LEFT HIP (WITH PELVIS) 2-3 VIEWS

[t pelvis ap]
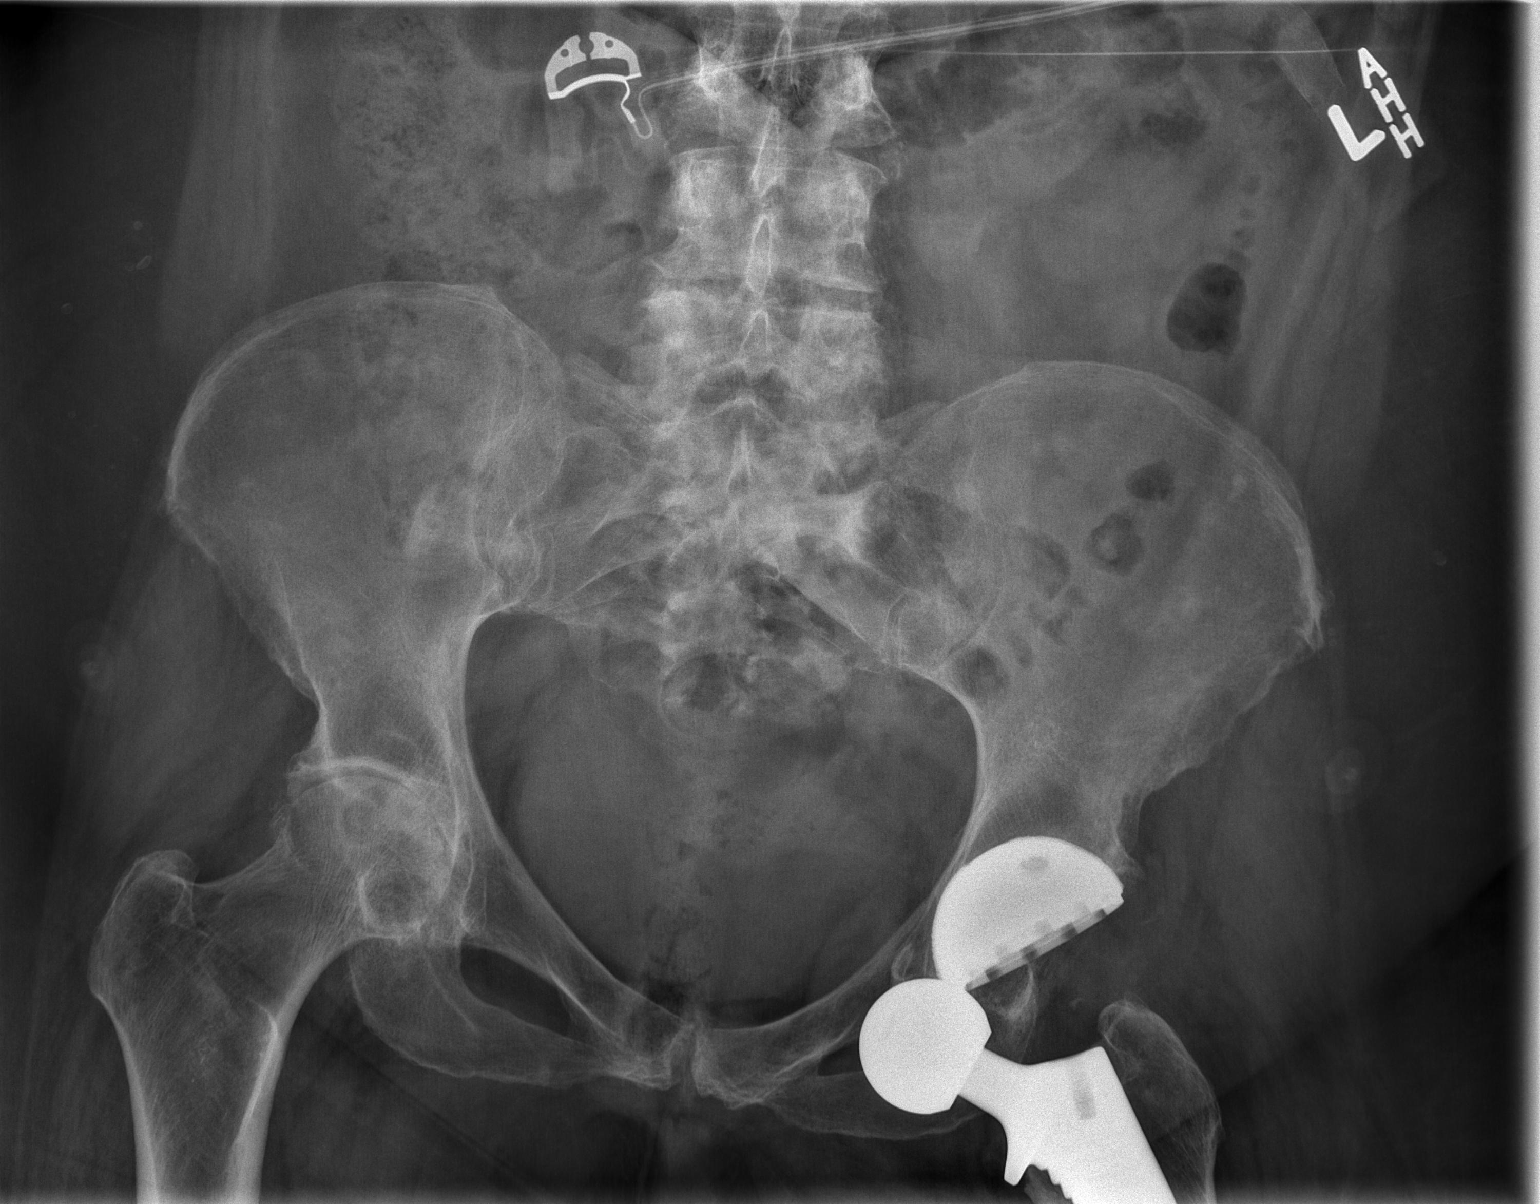

[t hip ap left]
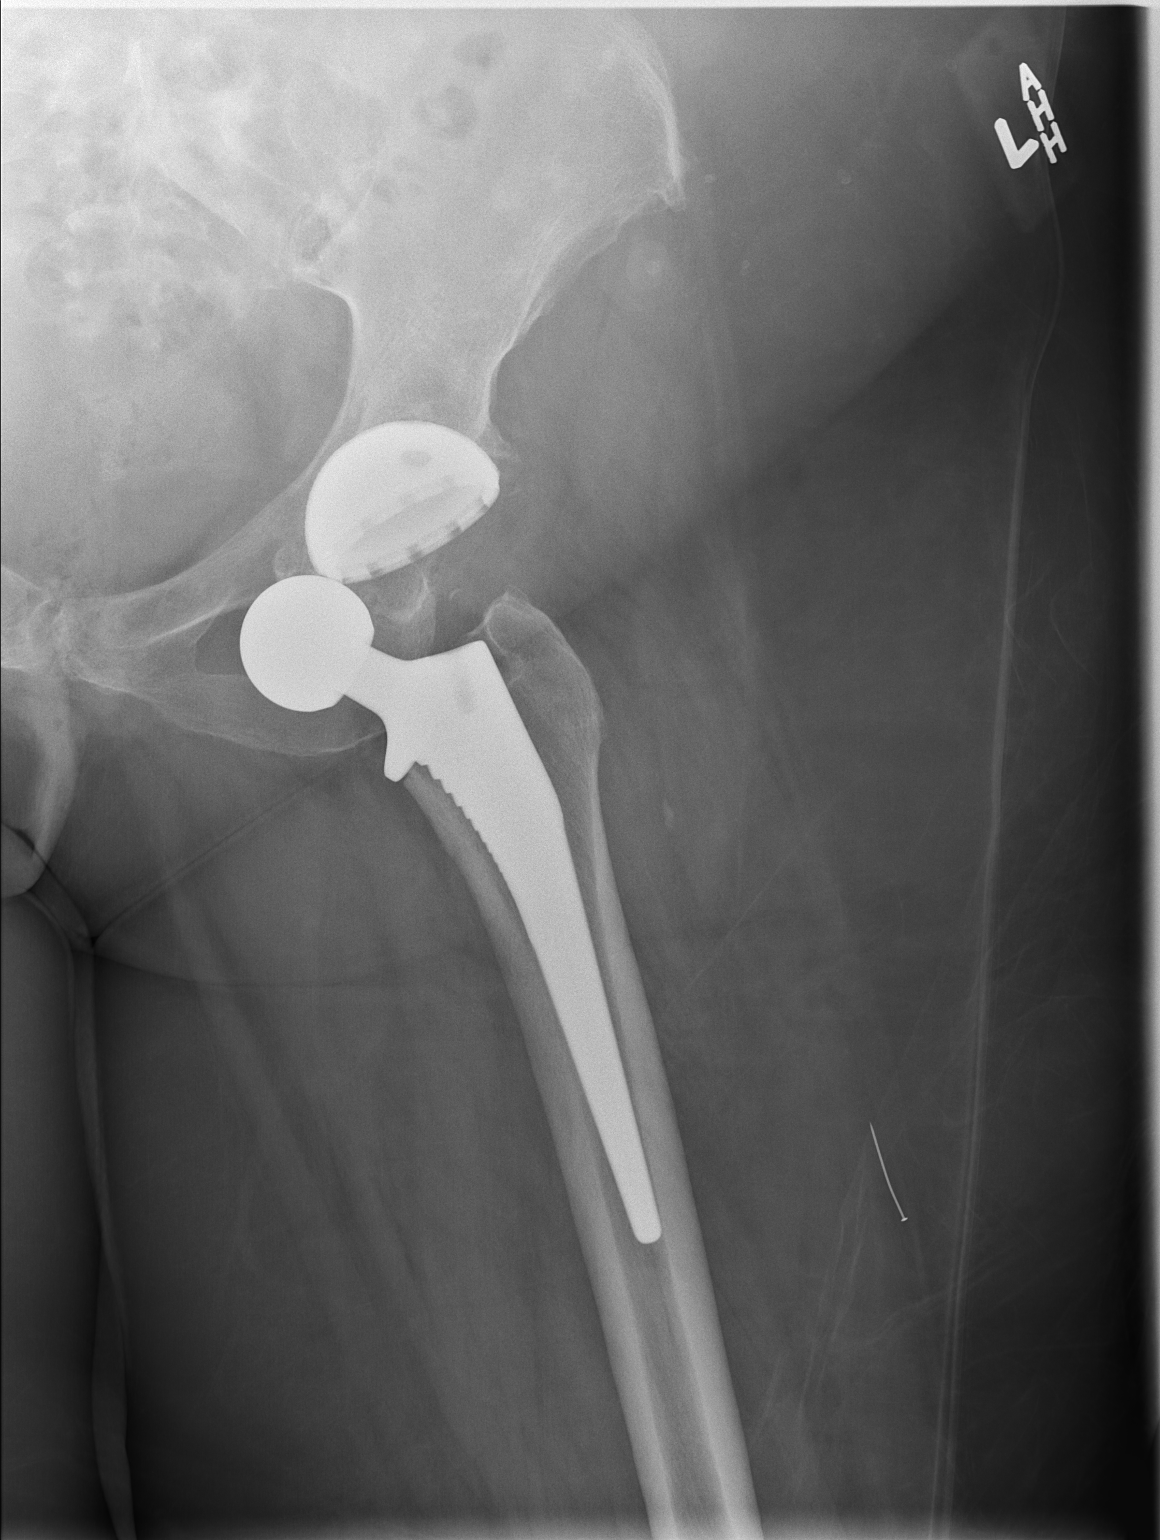

[w hip lat left]
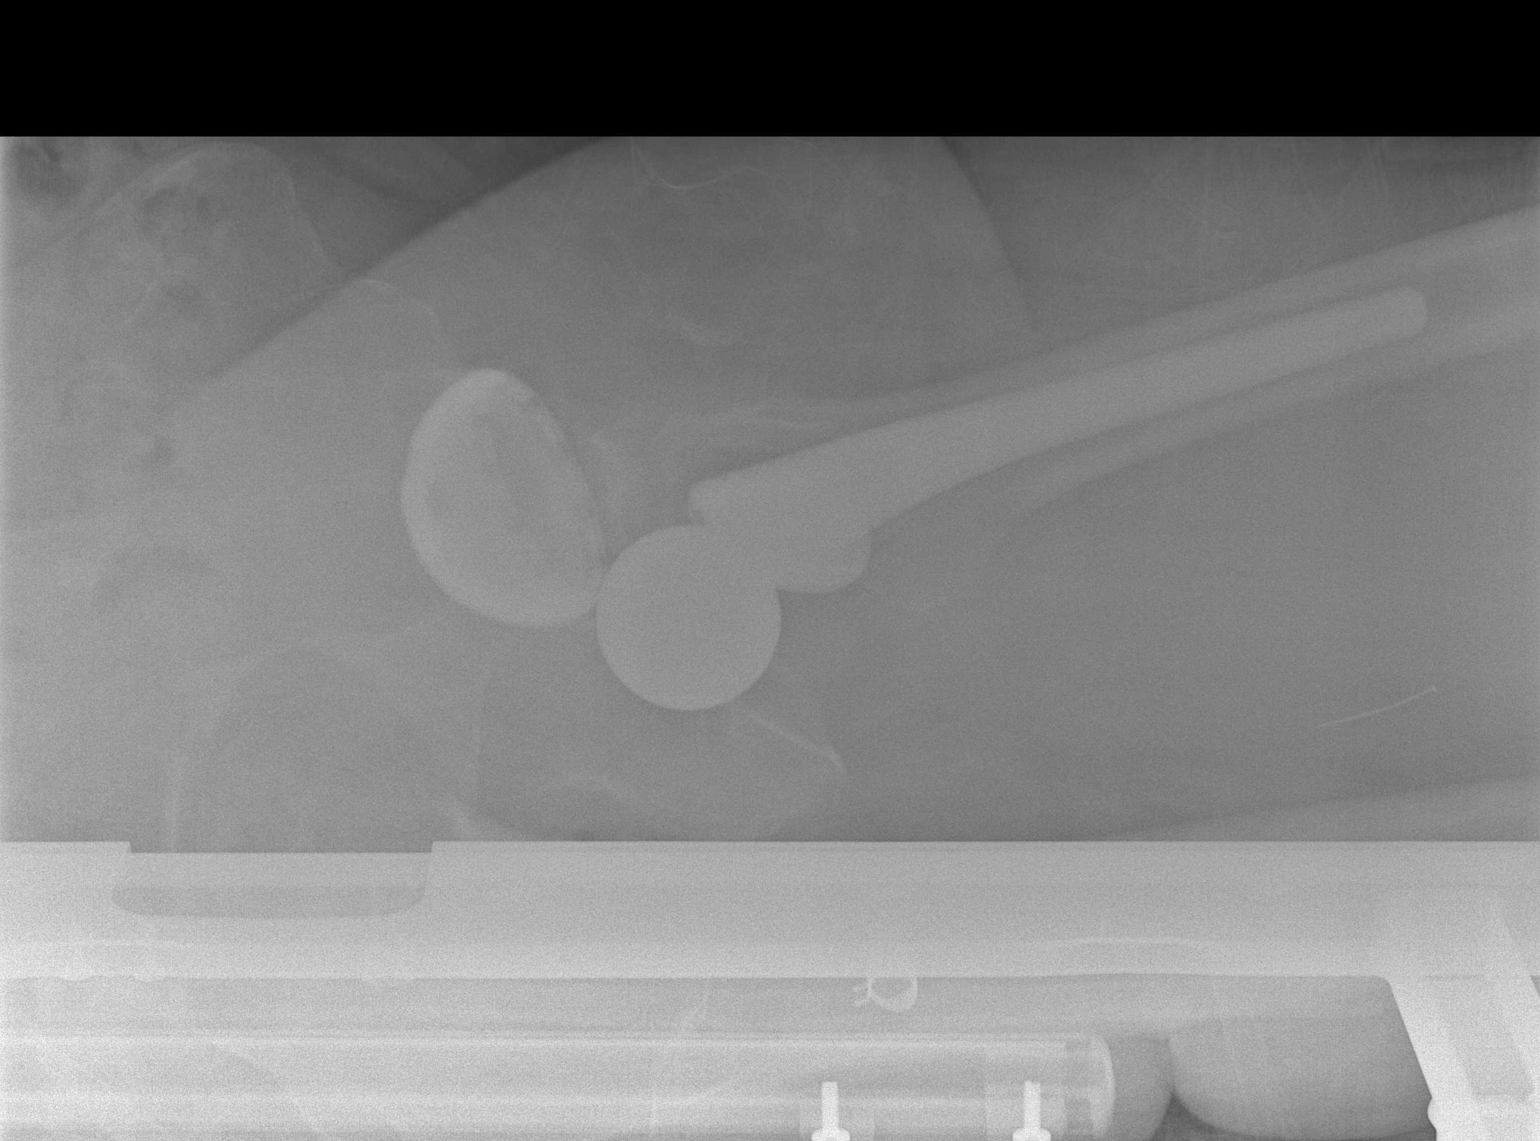

[3 of 3 positions shown; findings below may reference images not displayed]

FINDINGS: Left total hip arthroplasty identified. There has been interval
posterior medial dislocation of the femoral head component.

No acute fracture is identified.

Moderate -severe degenerative changes in the right hip noted.
IMPRESSION: Posterior medial dislocation of femoral head component of left total
hip arthroplasty.

## 2017-02-19 ENCOUNTER — Ambulatory Visit: Payer: Medicare Other | Admitting: Physical Therapy

## 2017-02-20 ENCOUNTER — Ambulatory Visit (INDEPENDENT_AMBULATORY_CARE_PROVIDER_SITE_OTHER): Payer: Medicare Other | Admitting: Orthopaedic Surgery

## 2017-02-20 DIAGNOSIS — G8929 Other chronic pain: Secondary | ICD-10-CM | POA: Diagnosis not present

## 2017-02-20 DIAGNOSIS — M25511 Pain in right shoulder: Secondary | ICD-10-CM

## 2017-02-20 DIAGNOSIS — M25561 Pain in right knee: Secondary | ICD-10-CM | POA: Diagnosis not present

## 2017-02-20 DIAGNOSIS — M25562 Pain in left knee: Secondary | ICD-10-CM

## 2017-02-20 NOTE — Progress Notes (Signed)
[name] is now 4 months out from a left knee arthroscopy. She still has patellofemoral pain on both reasonably grinding. He gets muscle pain in general. Her primary care physician and started on vitamin D. She says her right shoulder is doing much better overall. That is his shoulder we consider arthroscopic intervention right now her pain is minimal. Her knees are her biggest concern.  On exam a short knee shows slight varus malalignment of her knees. Both knees hyperextend and patellofemoral crepitation but her ligaments stable. She understands fully that strengthen her quad muscles and wasn't well help her knees and most. She has excellent range motion of the right shoulder doesn't seem to bother much.  At this point she'll follow-up as needed and lesser something that is worsening at any time. All questions and concerns were answered and addressed.

## 2017-02-21 ENCOUNTER — Ambulatory Visit: Payer: Medicare Other | Attending: Orthopaedic Surgery | Admitting: Physical Therapy

## 2017-02-21 DIAGNOSIS — M25562 Pain in left knee: Secondary | ICD-10-CM | POA: Diagnosis not present

## 2017-02-21 DIAGNOSIS — R262 Difficulty in walking, not elsewhere classified: Secondary | ICD-10-CM | POA: Diagnosis not present

## 2017-02-21 DIAGNOSIS — G8929 Other chronic pain: Secondary | ICD-10-CM | POA: Diagnosis not present

## 2017-02-21 DIAGNOSIS — R29898 Other symptoms and signs involving the musculoskeletal system: Secondary | ICD-10-CM | POA: Insufficient documentation

## 2017-02-21 NOTE — Therapy (Signed)
[facility] [address]  [place], [place], [address] Phone: [phone]   Fax:  [phone]  Physical Therapy Treatment  Patient Details  Name: LISANNE PONCE MRN: [id number] Date of Birth: November 30, 1945 Referring Provider: Dr. Jean Rosenthal  Encounter Date: 02/21/2017      PT End of Session - 02/21/17 0848    Visit Number 6   Number of Visits 12   Date for PT Re-Evaluation 02/20/17   Authorization Type Medicare   PT Start Time 0840   PT Stop Time 0929   PT Time Calculation (min) 49 min   Activity Tolerance Patient tolerated treatment well   Behavior During Therapy [facility] for tasks assessed/performed      Past Medical History:  Diagnosis Date  . Arthritis   . DDD (degenerative disc disease), lumbosacral   . Diverticulosis   . Falls    hx of   . GERD (gastroesophageal reflux disease)   . H/O hiatal hernia   . History of diverticulitis of colon   . History of epilepsy    childhood age 67 to 68  ---secondary to high calcium deposts of optic nerve---  none since age 9 with pregency  . History of gastric ulcer   . History of kidney stones   . History of shingles    MARCH 2014--  BACK AREA  . History of vulvar dysplasia    S/P  LASER ABLATION 2014  . IBS (irritable bowel syndrome)   . Mild obstructive sleep apnea    SLEEP STUDY  11-19-2012--  NO CPAP RX (DID NOT MEET CRITIRIA)  . Nodule of right lung    BENIGN--- AND STABLE PER  CT  11/2013  . Osteoporosis   . Renal cyst, left    STABLE PER CT 11/2013  . Seizures ([place])   . 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 ANTERIOR HIP REVISION Left 10/18/2014   Procedure: EXCHANGE LEFT HIP BALL OF DIRECT ANTERIOR TOTAL HIP ARTHROPLASTY;  Surgeon: Mcarthur Rossetti, MD;  Location: [place];  Service: Orthopedics;   Laterality: Left;  . AUGMENTATION MAMMAPLASTY     SALINE  . BLADDER SURGERY  1991   bladder reconstruction of torsion  . CARDIAC CATHETERIZATION  06-26-2001   NORMAL CORONARY ARTERIES  . CATARACT EXTRACTION W/ INTRAOCULAR LENS  IMPLANT, BILATERAL    . CHOLECYSTECTOMY  11-18-2003  . CO2 LASER APPLICATION N/A 07/10/9483   Procedure: CO2 LASER APPLICATION;  Surgeon: Terrance Mass, MD;  Location: [facility];  Service: Gynecology;  Laterality: N/A;CO2 laser of vaginal and vulvar lesions  . COLONOSCOPY    . CYSTOSCOPY WITH RETROGRADE PYELOGRAM, URETEROSCOPY AND STENT PLACEMENT Right 11/22/2013   Procedure: CYSTOSCOPY WITH RETROGRADE PYELOGRAM, URETEROSCOPY AND STENT PLACEMENT;  Surgeon: Sharyn Creamer, MD;  Location: [facility];  Service: Urology;  Laterality: Right;  . CYSTOSCOPY/RETROGRADE/URETEROSCOPY Right 12/01/2013   Procedure: CYSTOSCOPY, RIGHT RETROGRADE/RIGHT DIGITAL URETEROSCOPY, RIGHT URETERAL STENT EXCHANGE;  Surgeon: Sharyn Creamer, MD;  Location: [facility];  Service: Urology;  Laterality: Right;  STAGED RIGHT URETEROSCOPY RIGHT URETER DILATION    . DILATION AND CURETTAGE OF UTERUS  multiple -- last one 1975  . GYNECOLOGIC CRYOSURGERY    . HEMORRHOID SURGERY  1970's  . HIP CLOSED REDUCTION Left 10/16/2014   Procedure: CLOSED  MANIPULATION HIP;  Surgeon: Mcarthur Rossetti, MD;  [facility];  Service: Orthopedics;  Laterality: Left;  . HOLMIUM LASER APPLICATION Right 11/02/8313   Procedure: HOLMIUM LASER APPLICATION;  Surgeon: Sharyn Creamer, MD;  Location: [facility];  Service: Urology;  Laterality: Right;  . KNEE SURGERY  11/2016  . [place] SURGERY  1985  . ORIF RIGHT BIMALLEOLAR ANKLE FX  12-18-2003  . TOTAL HIP ARTHROPLASTY Left 09/16/2014   Procedure: LEFT TOTAL HIP ARTHROPLASTY ANTERIOR APPROACH;  Surgeon: Mcarthur Rossetti, MD;  Location[facility];  Service: Orthopedics;  Laterality: Left;   . TOTAL HIP ARTHROPLASTY Right 02/17/2015   Procedure: RIGHT TOTAL HIP ARTHROPLASTY ANTERIOR APPROACH;  Surgeon: Mcarthur Rossetti, MD;  [facility];  Service: Orthopedics;  Laterality: Right;  . UPPER GASTROINTESTINAL ENDOSCOPY    . VAGINAL HYSTERECTOMY  1975  . WIDE LOCAL EXCISION OF FOURCHETTE AND PERINEUM  04-06-2009   VULVAR CARCINOMA IN SITU    There were no vitals filed for this visit.      Subjective Assessment - 02/21/17 0843    Subjective Had prescription for Vit D - feeling better; saw ortho MD yesterday   Pertinent History Tuberous sclerosis, B THA   Patient Stated Goals improve pain and function   Currently in Pain? Yes   Pain Score 8    Pain Location Knee   Pain Orientation Right;Left   Pain Descriptors / Indicators Aching;Sore                         [facility] Adult PT Treatment/Exercise - 02/21/17 0903      Knee/Hip Exercises: Aerobic   Nustep L5 x 6 min     Knee/Hip Exercises: Standing   Forward Step Up Both;15 reps;Hand Hold: 2;Step Height: 8"   Forward Step Up Limitations alternating - visible glute weakness     Knee/Hip Exercises: Supine   Bridges Both;20 reps   Bridges Limitations gree tband   Straight Leg Raises Both;15 reps   Straight Leg Raises Limitations 3#   Other Supine Knee/Hip Exercises B clam - green tband x 15      Knee/Hip Exercises: Prone   Hip Extension --   Hip Extension Limitations --     Modalities   Modalities Iontophoresis     Iontophoresis   Type of Iontophoresis Dexamethasone   Location B anterior knee   Dose 1.0 mL   Time 4-6 hours; 80 mA     Manual Therapy   Manual Therapy Soft tissue mobilization   Soft tissue mobilization STM to anterior, medial, and lateral R quad          Trigger Point Dry Needling - 02/21/17 0917    Consent Given? Yes   Muscles Treated Lower Body Quadriceps   Quadriceps Response Palpable increased muscle length;Twitch response elicited                PT  Short Term Goals - 01/16/17 1616      PT SHORT TERM GOAL #1   Title patient to be independent with initial HEP   Status On-going           PT Long Term Goals - 01/16/17 1616      PT LONG TERM GOAL #1   Title patient to be independent with advanced HEP   Status On-going     PT LONG TERM GOAL #2   Title Patient to improve gross L LE strength to >/=  4+/5   Status On-going     PT LONG TERM GOAL #3   Title patient to demonstrate ability ot ascend/descend up 1 flight of stairs with reciprocal gait pattern and single rail   Status On-going     PT LONG TERM GOAL #4   Title patient to report ability to stand/walk >/= 30 min at a time without pain being a limiting factor   Status On-going               Plan - 02/21/17 0950    Clinical Impression Statement Patient doing well today - repors seeing MD yesterday, of which he has released her. DN treatment as well as gentle strengthening today focused on quad as well as glute mm. as patient reports difficulty with stepping into taller vehicle. WIll continue to progress as pateint tolerates.    PT Treatment/Interventions ADLs/Self Care Home Management;Cryotherapy;Electrical Stimulation;Iontophoresis 4mg /ml Dexamethasone;Moist Heat;Ultrasound;Neuromuscular re-education;Balance training;Therapeutic exercise;Therapeutic activities;Functional mobility training;Gait training;Stair training;Patient/family education;Manual techniques;Passive range of motion;Vasopneumatic Device;Taping;Dry needling   Consulted and Agree with Plan of Care Patient      Patient will benefit from skilled therapeutic intervention in order to improve the following deficits and impairments:  Abnormal gait, Decreased activity tolerance, Decreased mobility, Decreased strength, Difficulty walking, Pain  Visit Diagnosis: Chronic pain of left knee  Difficulty in walking, not elsewhere classified  Other symptoms and signs involving the musculoskeletal  system     Problem List Patient Active Problem List   Diagnosis Date Noted  . Status post arthroscopy of left knee 10/24/2016  . Acute pyelonephritis 08/18/2016  . Chest pressure 08/18/2016  . Essential hypertension 08/18/2016  . Hyponatremia 08/18/2016  . Diastolic dysfunction 70/26/3785  . AKI (acute kidney injury) ([place]) 08/18/2016  . Sepsis ([place])   . Acute lower UTI   . Tachycardia   . Osteoarthritis of right hip 02/17/2015  . 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 Atrial tachycardia ([place]) 12/12/2014  . 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 Recurrent dislocation of hip 10/17/2014  . Dislocation of internal left hip prosthesis ([place]) 10/16/2014  . Ectopic atrial rhythm 10/12/2014  . Atrial bigeminy 10/12/2014  . Osteoarthritis of left hip 09/16/2014  . Status post total replacement of left hip 09/16/2014  . ASCUS favoring benign 09/05/2014  . History of vitamin B deficiency 05/02/2014  . Acute bronchitis 04/18/2014  . Oral aphthous ulcer 04/18/2014  . Edema 12/21/2013  . Irregular heart beat 12/21/2013  . Anuria 11/12/2013  . History of cervical dysplasia 09/21/2013  . Rectocele, female 09/21/2013  . Kidney stone 09/21/2013  . Rash and nonspecific skin eruption 06/25/2013  . Obesity (BMI 30-39.9) 06/16/2013  . IBS (irritable bowel syndrome) 12/22/2012  . Other sleep disturbances 10/27/2012  . Elevated BP 09/29/2012  . Weight gain 08/17/2012  . Osteopenia 08/17/2012  . Vulvar lesion 08/10/2012  . 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 Condyloma acuminatum in female 07/14/2012  . Shingles 07/14/2012  . Vitamin D deficiency 07/14/2012     Lanney Gins, PT, DPT 02/21/17 9:54 AM   [facility] [address]  [place], [place], [address] Phone: [phone]   Fax:  [phone]  Name: FABIANA DROMGOOLE MRN: [id number] Date of Birth: May 21, 1946

## 2017-02-26 ENCOUNTER — Ambulatory Visit: Payer: Medicare Other | Admitting: Physical Therapy

## 2017-02-27 ENCOUNTER — Ambulatory Visit (INDEPENDENT_AMBULATORY_CARE_PROVIDER_SITE_OTHER): Payer: Medicare Other | Admitting: Orthopaedic Surgery

## 2017-02-28 ENCOUNTER — Ambulatory Visit: Payer: Medicare Other | Admitting: Physical Therapy

## 2017-03-07 ENCOUNTER — Ambulatory Visit: Payer: Medicare Other | Attending: Orthopaedic Surgery | Admitting: Physical Therapy

## 2017-03-07 DIAGNOSIS — G8929 Other chronic pain: Secondary | ICD-10-CM

## 2017-03-07 DIAGNOSIS — M25562 Pain in left knee: Secondary | ICD-10-CM | POA: Diagnosis not present

## 2017-03-07 DIAGNOSIS — R29898 Other symptoms and signs involving the musculoskeletal system: Secondary | ICD-10-CM | POA: Diagnosis not present

## 2017-03-07 DIAGNOSIS — R262 Difficulty in walking, not elsewhere classified: Secondary | ICD-10-CM | POA: Diagnosis not present

## 2017-03-07 NOTE — Therapy (Addendum)
[facility] [address]  [place], [place], [address] Phone: [phone]   Fax:  [phone]  Physical Therapy Treatment  Patient Details  Name: [name] MRN: [id number] Date of Birth: 07-01-1945 Referring Provider: Dr. Jean Rosenthal  Encounter Date: 03/07/2017      PT End of Session - 03/07/17 0901    Visit Number 7   Number of Visits 12   Date for PT Re-Evaluation 04/04/17   Authorization Type Medicare   PT Start Time 3474  pt late   PT Stop Time 0932   PT Time Calculation (min) 39 min   Activity Tolerance Patient tolerated treatment well   Behavior During Therapy [facility] for tasks assessed/performed      Past Medical History:  Diagnosis Date  . Arthritis   . DDD (degenerative disc disease), lumbosacral   . Diverticulosis   . Falls    hx of   . GERD (gastroesophageal reflux disease)   . H/O hiatal hernia   . History of diverticulitis of colon   . History of epilepsy    childhood age 32 to 65  ---secondary to high calcium deposts of optic nerve---  none since age 41 with pregency  . History of gastric ulcer   . History of kidney stones   . History of shingles    MARCH 2014--  BACK AREA  . History of vulvar dysplasia    S/P  LASER ABLATION 2014  . IBS (irritable bowel syndrome)   . Mild obstructive sleep apnea    SLEEP STUDY  11-19-2012--  NO CPAP RX (DID NOT MEET CRITIRIA)  . Nodule of right lung    BENIGN--- AND STABLE PER  CT  11/2013  . Osteoporosis   . Renal cyst, left    STABLE PER CT 11/2013  . Seizures ([place])   . 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 ANTERIOR HIP REVISION Left 10/18/2014   Procedure: EXCHANGE LEFT HIP BALL OF DIRECT ANTERIOR TOTAL HIP ARTHROPLASTY;  Surgeon: Mcarthur Rossetti, MD;  Location: [place];  Service:  Orthopedics;  Laterality: Left;  . AUGMENTATION MAMMAPLASTY     SALINE  . BLADDER SURGERY  1991   bladder reconstruction of torsion  . CARDIAC CATHETERIZATION  06-26-2001   NORMAL CORONARY ARTERIES  . CATARACT EXTRACTION W/ INTRAOCULAR LENS  IMPLANT, BILATERAL    . CHOLECYSTECTOMY  11-18-2003  . CO2 LASER APPLICATION N/A 07/08/9561   Procedure: CO2 LASER APPLICATION;  Surgeon: Terrance Mass, MD;  Location: [facility];  Service: Gynecology;  Laterality: N/A;CO2 laser of vaginal and vulvar lesions  . COLONOSCOPY    . CYSTOSCOPY WITH RETROGRADE PYELOGRAM, URETEROSCOPY AND STENT PLACEMENT Right 11/22/2013   Procedure: CYSTOSCOPY WITH RETROGRADE PYELOGRAM, URETEROSCOPY AND STENT PLACEMENT;  Surgeon: Sharyn Creamer, MD;  Location: [facility];  Service: Urology;  Laterality: Right;  . CYSTOSCOPY/RETROGRADE/URETEROSCOPY Right 12/01/2013   Procedure: CYSTOSCOPY, RIGHT RETROGRADE/RIGHT DIGITAL URETEROSCOPY, RIGHT URETERAL STENT EXCHANGE;  Surgeon: Sharyn Creamer, MD;  Location: [facility];  Service: Urology;  Laterality: Right;  STAGED RIGHT URETEROSCOPY RIGHT URETER DILATION    . DILATION AND CURETTAGE OF UTERUS  multiple -- last one 1975  . GYNECOLOGIC CRYOSURGERY    . HEMORRHOID SURGERY  1970's  . HIP CLOSED REDUCTION Left 10/16/2014  Procedure: CLOSED MANIPULATION HIP;  Surgeon: Mcarthur Rossetti, MD;  Location: [facility];  Service: Orthopedics;  Laterality: Left;  . HOLMIUM LASER APPLICATION Right 06/10/5629   Procedure: HOLMIUM LASER APPLICATION;  Surgeon: Sharyn Creamer, MD;  Location: [facility];  Service: Urology;  Laterality: Right;  . KNEE SURGERY  11/2016  . [place] SURGERY  1985  . ORIF RIGHT BIMALLEOLAR ANKLE FX  12-18-2003  . TOTAL HIP ARTHROPLASTY Left 09/16/2014   Procedure: LEFT TOTAL HIP ARTHROPLASTY ANTERIOR APPROACH;  Surgeon: Mcarthur Rossetti, MD;  Location: [facility];  Service: Orthopedics;   Laterality: Left;  . TOTAL HIP ARTHROPLASTY Right 02/17/2015   Procedure: RIGHT TOTAL HIP ARTHROPLASTY ANTERIOR APPROACH;  Surgeon: Mcarthur Rossetti, MD;  Location: [facility];  Service: Orthopedics;  Laterality: Right;  . UPPER GASTROINTESTINAL ENDOSCOPY    . VAGINAL HYSTERECTOMY  1975  . WIDE LOCAL EXCISION OF FOURCHETTE AND PERINEUM  04-06-2009   VULVAR CARCINOMA IN SITU    There were no vitals filed for this visit.      Subjective Assessment - 03/07/17 0901    Subjective Had sciatic pain this week - better today; has pain in anterior/posterior lower legs.   Pertinent History Tuberous sclerosis, B THA   Patient Stated Goals improve pain and function   Currently in Pain? Yes   Pain Score 2    Pain Location Knee   Pain Orientation Right;Left   Pain Descriptors / Indicators Discomfort   Pain Type Chronic pain                         [facility] Adult PT Treatment/Exercise - 03/07/17 0904      Knee/Hip Exercises: Stretches   Gastroc Stretch Right;Left;2 reps;30 seconds   Gastroc Stretch Limitations manual by PT     Knee/Hip Exercises: Aerobic   Nustep L4 x 6 min          Trigger Point Dry Needling - 03/07/17 1140    Consent Given? Yes   Muscles Treated Lower Body Gastrocnemius;Tibialis anterior  bilateral   Gastrocnemius Response Twitch response elicited;Palpable increased muscle length   Tibialis Anterior Response Twitch response elicited;Palpable increased muscle length                PT Short Term Goals - 03/07/17 0902      PT SHORT TERM GOAL #1   Title patient to be independent with initial HEP   Status Achieved           PT Long Term Goals - 03/07/17 0902      PT LONG TERM GOAL #1   Title patient to be independent with advanced HEP   Status On-going     PT LONG TERM GOAL #2   Title Patient to improve gross L LE strength to >/= 4+/5   Status On-going     PT LONG TERM GOAL #3   Title patient to demonstrate ability ot  ascend/descend up 1 flight of stairs with reciprocal gait pattern and single rail   Status On-going  intermittent due to sciatic type pain     PT LONG TERM GOAL #4   Title patient to report ability to stand/walk >/= 30 min at a time without pain being a limiting factor   Status Achieved               Plan - 03/07/17 0903    Clinical Impression Statement Patient doing well today. Patient reporting sudden  onset of sciatic type pain last week that has since resolved. Patient also with concerns regarding B LE pain that is constant and interferes with functional mobility. Patient noting improvement in general pain symptoms since beginning therapy and has been compliant with HEP. Discussion with patient today regarding being past current POC dates with PT and patient agreeable to extend POC in the short term, as patient is moving. Patient to call PT by Monday regarding pain symtpoms and response to todays treatment as well as to discuss possible continued treatment.    PT Treatment/Interventions ADLs/Self Care Home Management;Cryotherapy;Electrical Stimulation;Iontophoresis 31m/ml Dexamethasone;Moist Heat;Ultrasound;Neuromuscular re-education;Balance training;Therapeutic exercise;Therapeutic activities;Functional mobility training;Gait training;Stair training;Patient/family education;Manual techniques;Passive range of motion;Vasopneumatic Device;Taping;Dry needling   Consulted and Agree with Plan of Care Patient      Patient will benefit from skilled therapeutic intervention in order to improve the following deficits and impairments:  Abnormal gait, Decreased activity tolerance, Decreased mobility, Decreased strength, Difficulty walking, Pain  Visit Diagnosis: Chronic pain of left knee  Difficulty in walking, not elsewhere classified  Other symptoms and signs involving the musculoskeletal system     G-Codes    Functional Assessment Tool Used (Outpatient Only) Clinical judgement    Functional Limitation Mobility: Walking and moving around   Mobility: Walking and Moving Around goal Status ((E9407 At least 40 percent but less than 60 percent impaired, limited or restricted   Mobility: Walking and Moving Around d/c Status ((W8088 At least 40 percent but less than 60 percent impaired, limited or restricted     Problem List Patient Active Problem List   Diagnosis Date Noted  . Status post arthroscopy of left knee 10/24/2016  . Acute pyelonephritis 08/18/2016  . Chest pressure 08/18/2016  . Essential hypertension 08/18/2016  . Hyponatremia 08/18/2016  . Diastolic dysfunction 011/08/1592 . AKI (acute kidney injury) ([place] 08/18/2016  . Sepsis ([place]   . Acute lower UTI   . Tachycardia   . Osteoarthritis of right hip 02/17/2015  . 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 Atrial tachycardia ([place] 12/12/2014  . 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 Recurrent dislocation of hip 10/17/2014  . Dislocation of internal left hip prosthesis ([place] 10/16/2014  . Ectopic atrial rhythm 10/12/2014  . Atrial bigeminy 10/12/2014  . Osteoarthritis of left hip 09/16/2014  . Status post total replacement of left hip 09/16/2014  . ASCUS favoring benign 09/05/2014  . History of vitamin B deficiency 05/02/2014  . Acute bronchitis 04/18/2014  . Oral aphthous ulcer 04/18/2014  . Edema 12/21/2013  . Irregular heart beat 12/21/2013  . Anuria 11/12/2013  . History of cervical dysplasia 09/21/2013  . Rectocele, female 09/21/2013  . Kidney stone 09/21/2013  . Rash and nonspecific skin eruption 06/25/2013  . Obesity (BMI 30-39.9) 06/16/2013  . IBS (irritable bowel syndrome) 12/22/2012  . Other sleep disturbances 10/27/2012  . Elevated BP 09/29/2012  . Weight gain 08/17/2012  . Osteopenia 08/17/2012  . Vulvar lesion 08/10/2012  . Tuberous sclerosis ([place] 07/14/2012  . Condyloma acuminatum in female 07/14/2012  . Shingles 07/14/2012  .  Vitamin D deficiency 07/14/2012     SLanney Newton PT, DPT 03/07/17 11:41 AM   PHYSICAL THERAPY DISCHARGE SUMMARY  Visits from Start of Care: 7  Current functional level related to goals / functional outcomes: See above   Remaining deficits: See above, some pain as well as intermittent feelings of instability   Education / Equipment: HEP  Plan: Patient agrees to discharge.  Patient  goals were partially met. Patient is being discharged due to the patient's request.  ?????    Patient d/c due to moving out of town.   [name], PT, DPT 04/15/17 10:54 AM   [facility] [place], [place], [address] Phone: [phone]   Fax:  [phone]  Name: [name] MRN: [id number] Date of Birth: 31-Mar-1946

## 2017-03-13 DIAGNOSIS — M810 Age-related osteoporosis without current pathological fracture: Secondary | ICD-10-CM | POA: Diagnosis not present

## 2017-03-14 ENCOUNTER — Encounter: Payer: Medicare Other | Admitting: Obstetrics & Gynecology

## 2017-03-20 DIAGNOSIS — K219 Gastro-esophageal reflux disease without esophagitis: Secondary | ICD-10-CM | POA: Diagnosis not present

## 2017-03-20 DIAGNOSIS — Z0000 Encounter for general adult medical examination without abnormal findings: Secondary | ICD-10-CM | POA: Diagnosis not present

## 2017-03-20 DIAGNOSIS — Z1389 Encounter for screening for other disorder: Secondary | ICD-10-CM | POA: Diagnosis not present

## 2017-03-20 DIAGNOSIS — I4891 Unspecified atrial fibrillation: Secondary | ICD-10-CM | POA: Diagnosis not present

## 2017-03-20 DIAGNOSIS — Z6836 Body mass index (BMI) 36.0-36.9, adult: Secondary | ICD-10-CM | POA: Diagnosis not present

## 2017-03-20 DIAGNOSIS — Z23 Encounter for immunization: Secondary | ICD-10-CM | POA: Diagnosis not present

## 2017-03-20 DIAGNOSIS — Q851 Tuberous sclerosis: Secondary | ICD-10-CM | POA: Diagnosis not present

## 2017-03-20 DIAGNOSIS — E7800 Pure hypercholesterolemia, unspecified: Secondary | ICD-10-CM | POA: Diagnosis not present

## 2017-03-20 DIAGNOSIS — M810 Age-related osteoporosis without current pathological fracture: Secondary | ICD-10-CM | POA: Diagnosis not present

## 2017-03-20 DIAGNOSIS — E559 Vitamin D deficiency, unspecified: Secondary | ICD-10-CM | POA: Diagnosis not present

## 2017-03-26 ENCOUNTER — Telehealth (INDEPENDENT_AMBULATORY_CARE_PROVIDER_SITE_OTHER): Payer: Self-pay | Admitting: Orthopaedic Surgery

## 2017-03-26 NOTE — Telephone Encounter (Signed)
error 

## 2017-03-27 ENCOUNTER — Encounter (INDEPENDENT_AMBULATORY_CARE_PROVIDER_SITE_OTHER): Payer: Self-pay | Admitting: Family

## 2017-03-27 ENCOUNTER — Encounter (INDEPENDENT_AMBULATORY_CARE_PROVIDER_SITE_OTHER): Payer: Self-pay

## 2017-03-27 ENCOUNTER — Ambulatory Visit (INDEPENDENT_AMBULATORY_CARE_PROVIDER_SITE_OTHER): Payer: Medicare Other | Admitting: Physician Assistant

## 2017-03-27 ENCOUNTER — Ambulatory Visit (INDEPENDENT_AMBULATORY_CARE_PROVIDER_SITE_OTHER): Payer: Medicare Other | Admitting: Family

## 2017-03-27 DIAGNOSIS — G8929 Other chronic pain: Secondary | ICD-10-CM

## 2017-03-27 DIAGNOSIS — M25561 Pain in right knee: Secondary | ICD-10-CM

## 2017-03-27 DIAGNOSIS — M25562 Pain in left knee: Secondary | ICD-10-CM

## 2017-03-27 NOTE — Progress Notes (Signed)
Patient left without being seen.

## 2017-04-09 ENCOUNTER — Ambulatory Visit (INDEPENDENT_AMBULATORY_CARE_PROVIDER_SITE_OTHER): Payer: Medicare Other

## 2017-04-09 ENCOUNTER — Ambulatory Visit (INDEPENDENT_AMBULATORY_CARE_PROVIDER_SITE_OTHER): Payer: Medicare Other | Admitting: Physician Assistant

## 2017-04-09 ENCOUNTER — Encounter (INDEPENDENT_AMBULATORY_CARE_PROVIDER_SITE_OTHER): Payer: Self-pay | Admitting: Physician Assistant

## 2017-04-09 DIAGNOSIS — M170 Bilateral primary osteoarthritis of knee: Secondary | ICD-10-CM | POA: Diagnosis not present

## 2017-04-09 DIAGNOSIS — M25561 Pain in right knee: Secondary | ICD-10-CM

## 2017-04-09 NOTE — Progress Notes (Deleted)
Office Visit Note   Patient: [name]           Date of Birth: Aug 30, 1945           MRN: [id number] Visit Date: 04/09/2017              Requested by: Carol Ada, MD [place], [place] [address] PCP: Carol Ada, MD   Assessment & Plan: Visit Diagnoses:  1. Acute pain of right knee   2. Primary osteoarthritis of both knees     Plan: ***  Follow-Up Instructions: Return in about 2 weeks (around 04/23/2017).   Orders:  Orders Placed This Encounter  Procedures  . XR KNEE 3 VIEW RIGHT   No orders of the defined types were placed in this encounter.     Procedures: No procedures performed   Clinical Data: No additional findings.   Subjective: Chief Complaint  Patient presents with  . Left Knee - Pain  . Right Knee - Pain    HPI  Review of Systems   Objective: Vital Signs: There were no vitals taken for this visit.  Physical Exam  Constitutional: She is oriented to person, place, and time. She appears well-developed and well-nourished. No distress.  Neurological: She is alert and oriented to person, place, and time.  Skin: She is not diaphoretic.  Psychiatric: She has a normal mood and affect.    Ortho Exam Bilateral knees good range of motion .  No effusion , abnormal warmth or erythema. No instability. Right knee crepitus with range of motion. Tenderness medial and lateral joint lines bilateral. Specialty Comments:  No specialty comments available.  Imaging: No results found.   PMFS History: Patient Active Problem List   Diagnosis Date Noted  . Status post arthroscopy of left knee 10/24/2016  . Acute pyelonephritis 08/18/2016  . Chest pressure 08/18/2016  . Essential hypertension 08/18/2016  . Hyponatremia 08/18/2016  . Diastolic dysfunction 21/19/4174  . AKI (acute kidney injury) ([place]) 08/18/2016  . Sepsis ([place])   . Acute lower UTI   . Tachycardia   . Osteoarthritis of right hip 02/17/2015  . Status  post total replacement of right hip 02/17/2015  . Atrial tachycardia ([place]) 12/12/2014  . 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 Recurrent dislocation of hip 10/17/2014  . Dislocation of internal left hip prosthesis ([place]) 10/16/2014  . Ectopic atrial rhythm 10/12/2014  . Atrial bigeminy 10/12/2014  . Osteoarthritis of left hip 09/16/2014  . Status post total replacement of left hip 09/16/2014  . ASCUS favoring benign 09/05/2014  . History of vitamin B deficiency 05/02/2014  . Acute bronchitis 04/18/2014  . Oral aphthous ulcer 04/18/2014  . Edema 12/21/2013  . Irregular heart beat 12/21/2013  . Anuria 11/12/2013  . History of cervical dysplasia 09/21/2013  . Rectocele, female 09/21/2013  . Kidney stone 09/21/2013  . Rash and nonspecific skin eruption 06/25/2013  . Obesity (BMI 30-39.9) 06/16/2013  . IBS (irritable bowel syndrome) 12/22/2012  . Other sleep disturbances 10/27/2012  . Elevated BP 09/29/2012  . Weight gain 08/17/2012  . Osteopenia 08/17/2012  . Vulvar lesion 08/10/2012  . 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 Condyloma acuminatum in female 07/14/2012  . Shingles 07/14/2012  . Vitamin D deficiency 07/14/2012   Past Medical History:  Diagnosis Date  . Arthritis   . DDD (degenerative disc disease), lumbosacral   . Diverticulosis   . Falls    hx of   . GERD (gastroesophageal  reflux disease)   . H/O hiatal hernia   . History of diverticulitis of colon   . History of epilepsy    childhood age 16 to 57  ---secondary to high calcium deposts of optic nerve---  none since age 38 with pregency  . History of gastric ulcer   . History of kidney stones   . History of shingles    MARCH 2014--  BACK AREA  . History of vulvar dysplasia    S/P  LASER ABLATION 2014  . IBS (irritable bowel syndrome)   . Mild obstructive sleep apnea    SLEEP STUDY  11-19-2012--  NO CPAP RX (DID NOT MEET CRITIRIA)  . Nodule of right lung    BENIGN--- AND STABLE PER   CT  11/2013  . Osteoporosis   . Renal cyst, left    STABLE PER CT 11/2013  . Seizures ([place])   . 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 Heart disease Mother   . Tuberous sclerosis Mother   . Uterine cancer Mother   . Tuberous sclerosis Maternal Grandmother   . Tuberous sclerosis Sister   . Tuberous sclerosis Daughter   . Tuberous sclerosis Son        2 sons  . Tuberous sclerosis Maternal Aunt   . Tuberous sclerosis Maternal Aunt   . Tuberous sclerosis Maternal Aunt   . Tuberous sclerosis Cousin     Past Surgical History:  Procedure Laterality Date  . AUGMENTATION MAMMAPLASTY     SALINE  . BLADDER SURGERY  1991   bladder reconstruction of torsion  . CARDIAC CATHETERIZATION  06-26-2001   NORMAL CORONARY ARTERIES  . CATARACT EXTRACTION W/ INTRAOCULAR LENS  IMPLANT, BILATERAL    . CHOLECYSTECTOMY  11-18-2003  . COLONOSCOPY    . DILATION AND CURETTAGE OF UTERUS  multiple -- last one 1975  . GYNECOLOGIC CRYOSURGERY    . HEMORRHOID SURGERY  1970's  . KNEE SURGERY  11/2016  . [place] SURGERY  1985  . ORIF RIGHT BIMALLEOLAR ANKLE FX  12-18-2003  . UPPER GASTROINTESTINAL ENDOSCOPY    . VAGINAL HYSTERECTOMY  1975  . WIDE LOCAL EXCISION OF FOURCHETTE AND PERINEUM  04-06-2009   VULVAR CARCINOMA IN SITU   Social History   Occupational History  . Occupation: Education administrator: OTHER    Comment: [employer]  Tobacco Use  . Smoking status: Former Smoker    Packs/day: 2.00    Years: 25.00    Pack years: 50.00    Types: Cigarettes    Last attempt to quit: 06/03/1998    Years since quitting: 18.8  . Smokeless tobacco: Never Used  Substance and Sexual Activity  . Alcohol use: Yes    Comment: rarely has a glass of wine  . Drug use: No  . Sexual activity: Not Currently    Partners: Male

## 2017-04-10 NOTE — Progress Notes (Deleted)
Office Visit Note   Patient: [name]           Date of Birth: 29-Sep-1945           MRN: [id number] Visit Date: 04/09/2017              Requested by: Carol Ada, MD [address] [place], [place] [address] PCP: Carol Ada, MD   Assessment & Plan: Visit Diagnoses:  1. Acute pain of right knee   2. Primary osteoarthritis of both knees     Plan:   Follow-Up Instructions: Return in about 2 weeks (around 04/23/2017).   Orders:  Orders Placed This Encounter  Procedures  . XR KNEE 3 VIEW RIGHT   No orders of the defined types were placed in this encounter.     Procedures: Large Joint Inj: bilateral knee on 04/10/2017 6:15 PM Indications: pain Details: 22 G 1.5 in needle, anterolateral approach  Arthrogram: No  Medications (Right): 0.5 mL lidocaine 1 %; 40 mg methylPREDNISolone acetate 40 MG/ML Medications (Left): 0.5 mL lidocaine 1 %; 40 mg methylPREDNISolone acetate 40 MG/ML Outcome: tolerated well, no immediate complications Procedure, treatment alternatives, risks and benefits explained, specific risks discussed. Consent was given by the patient. Immediately prior to procedure a time out was called to verify the correct patient, procedure, equipment, support staff and site/side marked as required. Patient was prepped and draped in the usual sterile fashion.       Clinical Data: No additional findings.   Subjective: Chief Complaint  Patient presents with  . Left Knee - Pain  . Right Knee - Pain    HPI  31st  2018 and the left knee was injected.  She is now having burning pain in the knees constant pain.  Radiates down both legs to mid shins.  She denies any numbness or tingling down either legs.  She is tried Aleve and gabapentin which have not really helped.  She is requesting injections in both knees.  Her right knee did give out on her this past Monday and she fell.    Review of Systems No loss of consciousness, dizziness  chest pain shortness breath fevers chills  Objective: Vital Signs: There were no vitals taken for this visit.  Physical Exam  Constitutional: She is oriented to person, place, and time. She appears well-developed and well-nourished. No distress.  Neurological: She is alert and oriented to person, place, and time.  Skin: She is not diaphoretic.  Psychiatric: She has a normal mood and affect.    Ortho Exam Bilateral knees good range of motion .  No effusion , abnormal warmth or erythema. No instability. Right knee crepitus with range of motion. Tenderness medial and lateral joint lines bilateral. Specialty Comments:  No specialty comments available.  Imaging: No results found.   PMFS History: Patient Active Problem List   Diagnosis Date Noted  . Status post arthroscopy of left knee 10/24/2016  . Acute pyelonephritis 08/18/2016  . Chest pressure 08/18/2016  . Essential hypertension 08/18/2016  . Hyponatremia 08/18/2016  . Diastolic dysfunction 28/41/3244  . AKI (acute kidney injury) ([place]) 08/18/2016  . Sepsis ([place])   . Acute lower UTI   . Tachycardia   . Osteoarthritis of right hip 02/17/2015  . 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 Atrial tachycardia ([place]) 12/12/2014  . 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 Recurrent dislocation of hip 10/17/2014  . Dislocation of internal left hip prosthesis (  [place]) 10/16/2014  . Ectopic atrial rhythm 10/12/2014  . Atrial bigeminy 10/12/2014  . Osteoarthritis of left hip 09/16/2014  . Status post total replacement of left hip 09/16/2014  . ASCUS favoring benign 09/05/2014  . History of vitamin B deficiency 05/02/2014  . Acute bronchitis 04/18/2014  . Oral aphthous ulcer 04/18/2014  . Edema 12/21/2013  . Irregular heart beat 12/21/2013  . Anuria 11/12/2013  . History of cervical dysplasia 09/21/2013  . Rectocele, female 09/21/2013  . Kidney stone 09/21/2013  . Rash and nonspecific skin  eruption 06/25/2013  . Obesity (BMI 30-39.9) 06/16/2013  . IBS (irritable bowel syndrome) 12/22/2012  . Other sleep disturbances 10/27/2012  . Elevated BP 09/29/2012  . Weight gain 08/17/2012  . Osteopenia 08/17/2012  . Vulvar lesion 08/10/2012  . 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 Condyloma acuminatum in female 07/14/2012  . Shingles 07/14/2012  . Vitamin D deficiency 07/14/2012   Past Medical History:  Diagnosis Date  . Arthritis   . DDD (degenerative disc disease), lumbosacral   . Diverticulosis   . Falls    hx of   . GERD (gastroesophageal reflux disease)   . H/O hiatal hernia   . History of diverticulitis of colon   . History of epilepsy    childhood age 59 to 43  ---secondary to high calcium deposts of optic nerve---  none since age 44 with pregency  . History of gastric ulcer   . History of kidney stones   . History of shingles    MARCH 2014--  BACK AREA  . History of vulvar dysplasia    S/P  LASER ABLATION 2014  . IBS (irritable bowel syndrome)   . Mild obstructive sleep apnea    SLEEP STUDY  11-19-2012--  NO CPAP RX (DID NOT MEET CRITIRIA)  . Nodule of right lung    BENIGN--- AND STABLE PER  CT  11/2013  . Osteoporosis   . Renal cyst, left    STABLE PER CT 11/2013  . Seizures ([place])   . 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 Heart disease Mother   . Tuberous sclerosis Mother   . Uterine cancer Mother   . Tuberous sclerosis Maternal Grandmother   . Tuberous sclerosis Sister   . Tuberous sclerosis Daughter   . Tuberous sclerosis Son        2 sons  . Tuberous sclerosis Maternal Aunt   . Tuberous sclerosis Maternal Aunt   . Tuberous sclerosis Maternal Aunt   . Tuberous sclerosis Cousin     Past Surgical History:  Procedure Laterality Date  . AUGMENTATION MAMMAPLASTY     SALINE  . BLADDER SURGERY  1991   bladder  reconstruction of torsion  . CARDIAC CATHETERIZATION  06-26-2001   NORMAL CORONARY ARTERIES  . CATARACT EXTRACTION W/ INTRAOCULAR LENS  IMPLANT, BILATERAL    . CHOLECYSTECTOMY  11-18-2003  . COLONOSCOPY    . DILATION AND CURETTAGE OF UTERUS  multiple -- last one 1975  . GYNECOLOGIC CRYOSURGERY    . HEMORRHOID SURGERY  1970's  . KNEE SURGERY  11/2016  . [place] SURGERY  1985  . ORIF RIGHT BIMALLEOLAR ANKLE FX  12-18-2003  . UPPER GASTROINTESTINAL ENDOSCOPY    . VAGINAL HYSTERECTOMY  1975  . WIDE LOCAL EXCISION OF FOURCHETTE AND PERINEUM  04-06-2009   VULVAR CARCINOMA IN SITU  Social History   Occupational History  . Occupation: Education administrator: OTHER    Comment: [employer]  Tobacco Use  . Smoking status: Former Smoker    Packs/day: 2.00    Years: 25.00    Pack years: 50.00    Types: Cigarettes    Last attempt to quit: 06/03/1998    Years since quitting: 18.8  . Smokeless tobacco: Never Used  Substance and Sexual Activity  . Alcohol use: Yes    Comment: rarely has a glass of wine  . Drug use: No  . Sexual activity: Not Currently    Partners: Male

## 2017-04-10 NOTE — Progress Notes (Signed)
Office Visit Note   Patient: [name]           Date of Birth: February 01, 1946           MRN: [id number] Visit Date: 04/09/2017              Requested by: Carol Ada, MD [place], [place] [address] PCP: Carol Ada, MD   Assessment & Plan: Visit Diagnoses:  1. Acute pain of right knee   2. Primary osteoarthritis of both knees     Plan:  Follow up in  2 weeks to check progress or lack of.   Follow-Up Instructions: Return in about 2 weeks (around 04/23/2017).   Orders:  Orders Placed This Encounter  Procedures  . XR KNEE 3 VIEW RIGHT   No orders of the defined types were placed in this encounter.     Procedures: Large Joint Inj: bilateral knee on 04/10/2017 6:15 PM Indications: pain Details: 22 G 1.5 in needle, anterolateral approach  Arthrogram: No  Medications (Right): 0.5 mL lidocaine 1 %; 40 mg methylPREDNISolone acetate 40 MG/ML Medications (Left): 0.5 mL lidocaine 1 %; 40 mg methylPREDNISolone acetate 40 MG/ML Outcome: tolerated well, no immediate complications Procedure, treatment alternatives, risks and benefits explained, specific risks discussed. Consent was given by the patient. Immediately prior to procedure a time out was called to verify the correct patient, procedure, equipment, support staff and site/side marked as required. Patient was prepped and draped in the usual sterile fashion.       Clinical Data: No additional findings.   Subjective: Chief Complaint  Patient presents with  . Left Knee - Pain  . Right Knee - Pain    HPI  31st  2018 and the left knee was injected.  She is now having burning pain in the knees constant pain.  Radiates down both legs to mid shins.  She denies any numbness or tingling down either legs.  She is tried Aleve and gabapentin which have not really helped.  She is requesting injections in both knees.  Her right knee did give out on her this past Monday and she fell.    Review  of Systems No loss of consciousness, dizziness chest pain shortness breath fevers chills  Objective: Vital Signs: There were no vitals taken for this visit.  Physical Exam  Constitutional: She is oriented to person, place, and time. She appears well-developed and well-nourished. No distress.  Neurological: She is alert and oriented to person, place, and time.  Skin: She is not diaphoretic.  Psychiatric: She has a normal mood and affect.    Ortho Exam Bilateral knees good range of motion .  No effusion , abnormal warmth or erythema. No instability. Right knee crepitus with range of motion. Tenderness medial and lateral joint lines bilateral. Specialty Comments:  No specialty comments available.  Imaging: Right knee: No acute fracture.  Moderate narrowing medial joint line.  Moderate to severe patellofemoral arthritic changes.    PMFS History: Patient Active Problem List   Diagnosis Date Noted  . Status post arthroscopy of left knee 10/24/2016  . Acute pyelonephritis 08/18/2016  . Chest pressure 08/18/2016  . Essential hypertension 08/18/2016  . Hyponatremia 08/18/2016  . Diastolic dysfunction 71/24/5809  . AKI (acute kidney injury) ([place]) 08/18/2016  . Sepsis ([place])   . Acute lower UTI   . Tachycardia   . Osteoarthritis of right hip 02/17/2015  . 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 Atrial  tachycardia ([place]) 12/12/2014  . 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 Recurrent dislocation of hip 10/17/2014  . Dislocation of internal left hip prosthesis ([place]) 10/16/2014  . Ectopic atrial rhythm 10/12/2014  . Atrial bigeminy 10/12/2014  . Osteoarthritis of left hip 09/16/2014  . Status post total replacement of left hip 09/16/2014  . ASCUS favoring benign 09/05/2014  . History of vitamin B deficiency 05/02/2014  . Acute bronchitis 04/18/2014  . Oral aphthous ulcer 04/18/2014  . Edema 12/21/2013  . Irregular heart beat 12/21/2013  .  Anuria 11/12/2013  . History of cervical dysplasia 09/21/2013  . Rectocele, female 09/21/2013  . Kidney stone 09/21/2013  . Rash and nonspecific skin eruption 06/25/2013  . Obesity (BMI 30-39.9) 06/16/2013  . IBS (irritable bowel syndrome) 12/22/2012  . Other sleep disturbances 10/27/2012  . Elevated BP 09/29/2012  . Weight gain 08/17/2012  . Osteopenia 08/17/2012  . Vulvar lesion 08/10/2012  . 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 Condyloma acuminatum in female 07/14/2012  . Shingles 07/14/2012  . Vitamin D deficiency 07/14/2012   Past Medical History:  Diagnosis Date  . Arthritis   . DDD (degenerative disc disease), lumbosacral   . Diverticulosis   . Falls    hx of   . GERD (gastroesophageal reflux disease)   . H/O hiatal hernia   . History of diverticulitis of colon   . History of epilepsy    childhood age 50 to 44  ---secondary to high calcium deposts of optic nerve---  none since age 63 with pregency  . History of gastric ulcer   . History of kidney stones   . History of shingles    MARCH 2014--  BACK AREA  . History of vulvar dysplasia    S/P  LASER ABLATION 2014  . IBS (irritable bowel syndrome)   . Mild obstructive sleep apnea    SLEEP STUDY  11-19-2012--  NO CPAP RX (DID NOT MEET CRITIRIA)  . Nodule of right lung    BENIGN--- AND STABLE PER  CT  11/2013  . Osteoporosis   . Renal cyst, left    STABLE PER CT 11/2013  . Seizures ([place])   . 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 Heart disease Mother   . Tuberous sclerosis Mother   . Uterine cancer Mother   . Tuberous sclerosis Maternal Grandmother   . Tuberous sclerosis Sister   . Tuberous sclerosis Daughter   . Tuberous sclerosis Son        2 sons  . Tuberous sclerosis Maternal Aunt   . Tuberous sclerosis Maternal Aunt   . Tuberous sclerosis Maternal Aunt   .  Tuberous sclerosis Cousin     Past Surgical History:  Procedure Laterality Date  . AUGMENTATION MAMMAPLASTY     SALINE  . BLADDER SURGERY  1991   bladder reconstruction of torsion  . CARDIAC CATHETERIZATION  06-26-2001   NORMAL CORONARY ARTERIES  . CATARACT EXTRACTION W/ INTRAOCULAR LENS  IMPLANT, BILATERAL    . CHOLECYSTECTOMY  11-18-2003  . COLONOSCOPY    . DILATION AND CURETTAGE OF UTERUS  multiple -- last one 1975  . GYNECOLOGIC CRYOSURGERY    . HEMORRHOID SURGERY  1970's  . KNEE SURGERY  11/2016  . [place] SURGERY  1985  . ORIF RIGHT BIMALLEOLAR ANKLE FX  12-18-2003  .  UPPER GASTROINTESTINAL ENDOSCOPY    . VAGINAL HYSTERECTOMY  1975  . WIDE LOCAL EXCISION OF FOURCHETTE AND PERINEUM  04-06-2009   VULVAR CARCINOMA IN SITU   Social History   Occupational History  . Occupation: Education administrator: OTHER    Comment: [employer]  Tobacco Use  . Smoking status: Former Smoker    Packs/day: 2.00    Years: 25.00    Pack years: 50.00    Types: Cigarettes    Last attempt to quit: 06/03/1998    Years since quitting: 18.8  . Smokeless tobacco: Never Used  Substance and Sexual Activity  . Alcohol use: Yes    Comment: rarely has a glass of wine  . Drug use: No  . Sexual activity: Not Currently    Partners: Male

## 2017-05-07 ENCOUNTER — Ambulatory Visit (INDEPENDENT_AMBULATORY_CARE_PROVIDER_SITE_OTHER): Payer: Medicare Other | Admitting: Orthopaedic Surgery

## 2017-05-13 ENCOUNTER — Ambulatory Visit (INDEPENDENT_AMBULATORY_CARE_PROVIDER_SITE_OTHER): Payer: Medicare Other | Admitting: Orthopaedic Surgery

## 2017-06-03 DIAGNOSIS — I82409 Acute embolism and thrombosis of unspecified deep veins of unspecified lower extremity: Secondary | ICD-10-CM

## 2017-06-03 HISTORY — DX: Acute embolism and thrombosis of unspecified deep veins of unspecified lower extremity: I82.409

## 2017-06-10 ENCOUNTER — Other Ambulatory Visit (INDEPENDENT_AMBULATORY_CARE_PROVIDER_SITE_OTHER): Payer: Self-pay

## 2017-06-10 ENCOUNTER — Encounter (INDEPENDENT_AMBULATORY_CARE_PROVIDER_SITE_OTHER): Payer: Self-pay | Admitting: Orthopaedic Surgery

## 2017-06-10 ENCOUNTER — Ambulatory Visit (INDEPENDENT_AMBULATORY_CARE_PROVIDER_SITE_OTHER): Payer: Medicare Other | Admitting: Orthopaedic Surgery

## 2017-06-10 DIAGNOSIS — M25511 Pain in right shoulder: Secondary | ICD-10-CM | POA: Diagnosis not present

## 2017-06-10 DIAGNOSIS — G8929 Other chronic pain: Secondary | ICD-10-CM | POA: Diagnosis not present

## 2017-06-10 DIAGNOSIS — M25512 Pain in left shoulder: Secondary | ICD-10-CM

## 2017-06-10 DIAGNOSIS — M25562 Pain in left knee: Principal | ICD-10-CM

## 2017-06-10 MED ORDER — LIDOCAINE HCL 1 % IJ SOLN
3.0000 mL | INTRAMUSCULAR | Status: AC | PRN
  Administered 2017-06-10: 3 mL

## 2017-06-10 MED ORDER — METHYLPREDNISOLONE ACETATE 40 MG/ML IJ SUSP
40.0000 mg | INTRAMUSCULAR | Status: AC | PRN
  Administered 2017-06-10: 40 mg via INTRA_ARTICULAR

## 2017-06-10 NOTE — Progress Notes (Signed)
Office Visit Note   Patient: [name]           Date of Birth: April 17, 1946           MRN: [id number] Visit Date: 06/10/2017              Requested by: Carol Ada, MD [place], [place] [address] PCP: Carol Ada, MD   Assessment & Plan: Visit Diagnoses:  1. Chronic pain of both shoulders   2. Chronic pain of left knee     Plan: At this point I would like to obtain an MRI of her left knee to further evaluate the cartilage given the dramatic changes of her plain films.  This would need to determine whether or not we need to recommend a knee replacement or not.  This could even be a partial knee replacement we need to assess the cartilage throughout the knee.  Also talked about steroid injection of both shoulders and she was requesting this and she understands the risk and benefits of these and she tolerated injections well other shoulders.  I gave her prescription for outpatient physical therapy and will external [place].  I am fine with an MRI of her left knee obtained there and we can look it up on care everywhere.  All questions concerns were answered and addressed.  We will see her back in 4 weeks to see how she is doing overall from therapy on her shoulders and hopefully to be able to give her an MRI of her left knee.  Follow-Up Instructions: Return in about 4 weeks (around 07/08/2017).   Orders:  Orders Placed This Encounter  Procedures  . Large Joint Inj  . Large Joint Inj   No orders of the defined types were placed in this encounter.     Procedures: Large Joint Inj: R subacromial bursa on 06/10/2017 10:04 AM Indications: pain and diagnostic evaluation Details: 22 G 1.5 in needle  Arthrogram: No  Medications: 3 mL lidocaine 1 %; 40 mg methylPREDNISolone acetate 40 MG/ML Outcome: tolerated well, no immediate complications Procedure, treatment alternatives, risks and benefits explained, specific risks discussed. Consent was given by  the patient. Immediately prior to procedure a time out was called to verify the correct patient, procedure, equipment, support staff and site/side marked as required. Patient was prepped and draped in the usual sterile fashion.   Large Joint Inj: L subacromial bursa on 06/10/2017 10:04 AM Indications: pain and diagnostic evaluation Details: 22 G 1.5 in needle  Arthrogram: No  Medications: 3 mL lidocaine 1 %; 40 mg methylPREDNISolone acetate 40 MG/ML Outcome: tolerated well, no immediate complications Procedure, treatment alternatives, risks and benefits explained, specific risks discussed. Consent was given by the patient. Immediately prior to procedure a time out was called to verify the correct patient, procedure, equipment, support staff and site/side marked as required. Patient was prepped and draped in the usual sterile fashion.       Clinical Data: No additional findings.   Subjective: Chief Complaint  Patient presents with  . Left Knee - Pain  . Right Knee - Pain  . Right Shoulder - Pain   The patient is well-known to our clinic.  She is been dealing with left knee pain for over a year now.  We performed an arthroscopic intervention of her left knee in May 2018.  We found a large meniscal tear but just cartilage thinning in her knee mainly in the medial compartment but no full-thickness cartilage  loss.  We did obtain x-rays of her knee in November and it did appear that she has more varus malalignment and more significant narrowing of the medial joint space.  The knee still hurts mainly on the medial side and seems to be getting worse.  We have also had an MRI of her right shoulder in January 2018 that showed partial-thickness bursal surface tearing the rotator cuff and moderate tendinopathy of the rotator cuff tendons otherwise.  She has not had an injection in that shoulder very long time.  She like to have bilateral shoulder injections today and talked about the potential for  further surgery on her left knee.  However she is in [facility] now and like to consider therapy in that part of the state which I agree with.  She is not a diabetic. HPI  Review of Systems She denies any headache, chest pain, shortness of breath, fever, chills, nausea, vomiting.  She is alert and oriented x3 and in no acute distress  Objective: Vital Signs: There were no vitals taken for this visit.  Physical Exam She is alert and oriented x3 in no acute distress Ortho Exam Examination of both shoulders show signs of impingement but she moves the shoulder as well in terms of the integrity of the rotator cuff.  Both shoulders have good strength but pain on exam.  Both shoulders are well located.  Examination of her left knee shows mild varus malalignment with medial joint line tenderness. Specialty Comments:  No specialty comments available.  Imaging: No results found.   PMFS History: Patient Active Problem List   Diagnosis Date Noted  . Chronic pain of both shoulders 06/10/2017  . Chronic pain of left knee 06/10/2017  . Status post arthroscopy of left knee 10/24/2016  . Acute pyelonephritis 08/18/2016  . Chest pressure 08/18/2016  . Essential hypertension 08/18/2016  . Hyponatremia 08/18/2016  . Diastolic dysfunction 97/67/3419  . AKI (acute kidney injury) ([place]) 08/18/2016  . Sepsis ([place])   . Acute lower UTI   . Tachycardia   . Osteoarthritis of right hip 02/17/2015  . 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 Atrial tachycardia ([place]) 12/12/2014  . 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 Recurrent dislocation of hip 10/17/2014  . Dislocation of internal left hip prosthesis ([place]) 10/16/2014  . Ectopic atrial rhythm 10/12/2014  . Atrial bigeminy 10/12/2014  . Osteoarthritis of left hip 09/16/2014  . Status post total replacement of left hip 09/16/2014  . ASCUS favoring benign 09/05/2014  . History of vitamin B  deficiency 05/02/2014  . Acute bronchitis 04/18/2014  . Oral aphthous ulcer 04/18/2014  . Edema 12/21/2013  . Irregular heart beat 12/21/2013  . Anuria 11/12/2013  . History of cervical dysplasia 09/21/2013  . Rectocele, female 09/21/2013  . Kidney stone 09/21/2013  . Rash and nonspecific skin eruption 06/25/2013  . Obesity (BMI 30-39.9) 06/16/2013  . IBS (irritable bowel syndrome) 12/22/2012  . Other sleep disturbances 10/27/2012  . Elevated BP 09/29/2012  . Weight gain 08/17/2012  . Osteopenia 08/17/2012  . Vulvar lesion 08/10/2012  . 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 Condyloma acuminatum in female 07/14/2012  . Shingles 07/14/2012  . Vitamin D deficiency 07/14/2012   Past Medical History:  Diagnosis Date  . Arthritis   . DDD (degenerative disc disease), lumbosacral   . Diverticulosis   . Falls    hx of   . GERD (gastroesophageal reflux disease)   . H/O hiatal  hernia   . History of diverticulitis of colon   . History of epilepsy    childhood age 30 to 19  ---secondary to high calcium deposts of optic nerve---  none since age 48 with pregency  . History of gastric ulcer   . History of kidney stones   . History of shingles    MARCH 2014--  BACK AREA  . History of vulvar dysplasia    S/P  LASER ABLATION 2014  . IBS (irritable bowel syndrome)   . Mild obstructive sleep apnea    SLEEP STUDY  11-19-2012--  NO CPAP RX (DID NOT MEET CRITIRIA)  . Nodule of right lung    BENIGN--- AND STABLE PER  CT  11/2013  . Osteoporosis   . Renal cyst, left    STABLE PER CT 11/2013  . Seizures ([place])   . 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 Heart disease Mother   . Tuberous sclerosis Mother   . Uterine cancer Mother   . Tuberous sclerosis Maternal Grandmother   . Tuberous sclerosis Sister   . Tuberous sclerosis Daughter   . Tuberous  sclerosis Son        2 sons  . Tuberous sclerosis Maternal Aunt   . Tuberous sclerosis Maternal Aunt   . Tuberous sclerosis Maternal Aunt   . Tuberous sclerosis Cousin     Past Surgical History:  Procedure Laterality Date  . ANTERIOR HIP REVISION Left 10/18/2014   Procedure: EXCHANGE LEFT HIP BALL OF DIRECT ANTERIOR TOTAL HIP ARTHROPLASTY;  Surgeon: Mcarthur Rossetti, MD;  Location: [facility];  Service: Orthopedics;  Laterality: Left;  . AUGMENTATION MAMMAPLASTY     SALINE  . BLADDER SURGERY  1991   bladder reconstruction of torsion  . CARDIAC CATHETERIZATION  06-26-2001   NORMAL CORONARY ARTERIES  . CATARACT EXTRACTION W/ INTRAOCULAR LENS  IMPLANT, BILATERAL    . CHOLECYSTECTOMY  11-18-2003  . CO2 LASER APPLICATION N/A 09/07/9627   Procedure: CO2 LASER APPLICATION;  Surgeon: Terrance Mass, MD;  Location: [facility];  Service: Gynecology;  Laterality: N/A;CO2 laser of vaginal and vulvar lesions  . COLONOSCOPY    . CYSTOSCOPY WITH RETROGRADE PYELOGRAM, URETEROSCOPY AND STENT PLACEMENT Right 11/22/2013   Procedure: CYSTOSCOPY WITH RETROGRADE PYELOGRAM, URETEROSCOPY AND STENT PLACEMENT;  Surgeon: Sharyn Creamer, MD;  Location[facility];  Service: Urology;  Laterality: Right;  . CYSTOSCOPY/RETROGRADE/URETEROSCOPY Right 12/01/2013   Procedure: CYSTOSCOPY, RIGHT RETROGRADE/RIGHT DIGITAL URETEROSCOPY, RIGHT URETERAL STENT EXCHANGE;  Surgeon: Sharyn Creamer, MD;  Location[facility];  Service: Urology;  Laterality: Right;  STAGED RIGHT URETEROSCOPY RIGHT URETER DILATION    . DILATION AND CURETTAGE OF UTERUS  multiple -- last one 1975  . GYNECOLOGIC CRYOSURGERY    . HEMORRHOID SURGERY  1970's  . HIP CLOSED REDUCTION Left 10/16/2014   Procedure: CLOSED MANIPULATION HIP;  Surgeon: Mcarthur Rossetti, MD;  Location: [facility];  Service: Orthopedics;  Laterality: Left;  . HOLMIUM LASER APPLICATION Right 10/03/8411   Procedure: HOLMIUM LASER  APPLICATION;  Surgeon: Sharyn Creamer, MD;  Location: [facility];  Service: Urology;  Laterality: Right;  . KNEE SURGERY  11/2016  . [place] SURGERY  1985  . ORIF RIGHT BIMALLEOLAR ANKLE FX  12-18-2003  . TOTAL HIP ARTHROPLASTY Left 09/16/2014   Procedure: LEFT  TOTAL HIP ARTHROPLASTY ANTERIOR APPROACH;  Surgeon: Mcarthur Rossetti, MD;  Location: [facility];  Service: Orthopedics;  Laterality: Left;  . TOTAL HIP ARTHROPLASTY Right 02/17/2015   Procedure: RIGHT TOTAL HIP ARTHROPLASTY ANTERIOR APPROACH;  Surgeon: Mcarthur Rossetti, MD;  Location: [facility];  Service: Orthopedics;  Laterality: Right;  . UPPER GASTROINTESTINAL ENDOSCOPY    . VAGINAL HYSTERECTOMY  1975  . WIDE LOCAL EXCISION OF FOURCHETTE AND PERINEUM  04-06-2009   VULVAR CARCINOMA IN SITU   Social History   Occupational History  . Occupation: Education administrator: OTHER    Comment: [employer]  Tobacco Use  . Smoking status: Former Smoker    Packs/day: 2.00    Years: 25.00    Pack years: 50.00    Types: Cigarettes    Last attempt to quit: 06/03/1998    Years since quitting: 19.0  . Smokeless tobacco: Never Used  Substance and Sexual Activity  . Alcohol use: Yes    Comment: rarely has a glass of wine  . Drug use: No  . Sexual activity: Not Currently    Partners: Male

## 2017-06-22 DIAGNOSIS — J029 Acute pharyngitis, unspecified: Secondary | ICD-10-CM | POA: Diagnosis not present

## 2017-06-23 ENCOUNTER — Encounter: Payer: Medicare Other | Admitting: Obstetrics & Gynecology

## 2017-07-09 DIAGNOSIS — M25562 Pain in left knee: Secondary | ICD-10-CM | POA: Diagnosis not present

## 2017-07-09 DIAGNOSIS — G8929 Other chronic pain: Secondary | ICD-10-CM | POA: Diagnosis not present

## 2017-07-09 DIAGNOSIS — S83242A Other tear of medial meniscus, current injury, left knee, initial encounter: Secondary | ICD-10-CM | POA: Diagnosis not present

## 2017-07-15 ENCOUNTER — Ambulatory Visit (INDEPENDENT_AMBULATORY_CARE_PROVIDER_SITE_OTHER): Payer: Medicare Other | Admitting: Orthopaedic Surgery

## 2017-07-23 ENCOUNTER — Ambulatory Visit (INDEPENDENT_AMBULATORY_CARE_PROVIDER_SITE_OTHER): Payer: Medicare Other | Admitting: Orthopaedic Surgery

## 2017-08-05 ENCOUNTER — Encounter (INDEPENDENT_AMBULATORY_CARE_PROVIDER_SITE_OTHER): Payer: Self-pay | Admitting: Orthopaedic Surgery

## 2017-08-05 ENCOUNTER — Ambulatory Visit (INDEPENDENT_AMBULATORY_CARE_PROVIDER_SITE_OTHER): Payer: Medicare Other | Admitting: Orthopaedic Surgery

## 2017-08-05 DIAGNOSIS — M25511 Pain in right shoulder: Secondary | ICD-10-CM | POA: Diagnosis not present

## 2017-08-05 DIAGNOSIS — M25561 Pain in right knee: Secondary | ICD-10-CM

## 2017-08-05 DIAGNOSIS — M25512 Pain in left shoulder: Secondary | ICD-10-CM

## 2017-08-05 DIAGNOSIS — G8929 Other chronic pain: Secondary | ICD-10-CM | POA: Insufficient documentation

## 2017-08-05 DIAGNOSIS — M25562 Pain in left knee: Secondary | ICD-10-CM | POA: Diagnosis not present

## 2017-08-05 NOTE — Progress Notes (Signed)
Patient comes in today to go over an MRI of her left knee.  She has a history of bilateral hip replacements and we did a left knee arthroscopy 2 years ago.  We wanted to assess the cartilage in her knee to see if she is a candidate for any type of knee arthroplasty at this point.  Both her knees hurt but did not locking catch.  Both shoulders hurt as well.  She only had been to minimal physical therapy for her shoulder secondary to 2 bouts of bronchitis.  On examination of her knees both knees have neutral alignment patellofemoral crepitation.  Both knees for ligamentously stable.  The MRI of the left knee is reviewed in terms of the report and it shows chronic tearing of the meniscus but some of this may be postsurgical.  She is thinning of the cartilage in the medial compartment of her knee.  The ACL and PCL are intact and lateral compartments intact.  This point she is not initiate any type of surgery right now and I agree with please try to get her to warmer weather and trying outpatient physical therapy on both shoulders and both knees.  She did ask about supplements such as hemp and I told her she can try anything topical to see if that will help.  I gave her a new prescription for outpatient physical therapy as well.  We will see her back in 6 weeks to see how she is doing overall.  Of note she has bilateral thumb nail issues and the only thing we can do for this is a nailbed ablation and and nail matrix ablation of both thumbs.  She will call if she decides to have this done.  Regardless we will see her in 6 weeks to see how she is doing overall.

## 2017-08-26 DIAGNOSIS — H47011 Ischemic optic neuropathy, right eye: Secondary | ICD-10-CM | POA: Diagnosis not present

## 2017-08-26 DIAGNOSIS — H524 Presbyopia: Secondary | ICD-10-CM | POA: Diagnosis not present

## 2017-08-27 ENCOUNTER — Ambulatory Visit: Payer: Medicare Other | Admitting: Cardiology

## 2017-09-16 ENCOUNTER — Ambulatory Visit (INDEPENDENT_AMBULATORY_CARE_PROVIDER_SITE_OTHER): Payer: Medicare Other | Admitting: Orthopaedic Surgery

## 2017-09-16 ENCOUNTER — Encounter (INDEPENDENT_AMBULATORY_CARE_PROVIDER_SITE_OTHER): Payer: Self-pay | Admitting: Orthopaedic Surgery

## 2017-09-16 ENCOUNTER — Ambulatory Visit (INDEPENDENT_AMBULATORY_CARE_PROVIDER_SITE_OTHER): Payer: Medicare Other

## 2017-09-16 DIAGNOSIS — M7541 Impingement syndrome of right shoulder: Secondary | ICD-10-CM

## 2017-09-16 DIAGNOSIS — M25562 Pain in left knee: Secondary | ICD-10-CM | POA: Diagnosis not present

## 2017-09-16 DIAGNOSIS — M7062 Trochanteric bursitis, left hip: Secondary | ICD-10-CM

## 2017-09-16 MED ORDER — METHYLPREDNISOLONE ACETATE 40 MG/ML IJ SUSP
40.0000 mg | INTRAMUSCULAR | Status: AC | PRN
  Administered 2017-09-16: 40 mg via INTRA_ARTICULAR

## 2017-09-16 MED ORDER — GABAPENTIN 300 MG PO CAPS: 300.0000 mg | ORAL_CAPSULE | Freq: Every day | ORAL | 2 refills | Status: DC

## 2017-09-16 MED ORDER — LIDOCAINE HCL 1 % IJ SOLN
3.0000 mL | INTRAMUSCULAR | Status: AC | PRN
  Administered 2017-09-16: 3 mL

## 2017-09-16 NOTE — Progress Notes (Signed)
Office Visit Note   Patient: [name]           Date of Birth: [birth year]           MRN: [id number] Visit Date: 09/16/2017              Requested by: Carol Ada, MD [address] [place], [place] [id number] PCP: Carol Ada, MD   Assessment & Plan: Visit Diagnoses:  1. Acute pain of left knee     Plan: This point time recommend that she get a physical therapy for the shoulder.  In regards to the hip she can do some IT band stretching exercises.  She is given a refill on gabapentin which she takes at night which helps her pain allows her to sleep.  In regards to the knee due to the fact that she is failed conservative treatment and continues to have pain would recommend left total knee arthroplasty she would like to do this sometime in the fall or winter.  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  She will follow-up with [place] as needed.  Questions were encouraged and answered at length.  Follow-Up Instructions: No follow-ups on file.   Orders:  Orders Placed This Encounter  Procedures  . XR Knee 1-2 Views Left   Meds ordered this encounter  Medications  . gabapentin (NEURONTIN) 300 MG capsule    Sig: Take 1 capsule (300 mg total) by mouth at bedtime.    Dispense:  30 capsule    Refill:  2      Procedures: Large Joint Inj: L greater trochanter on 09/16/2017 11:19 AM Indications: pain Details: 22 G 1.5 in needle, lateral approach  Arthrogram: No  Medications: 3 mL lidocaine 1 %; 40 mg methylPREDNISolone acetate 40 MG/ML Outcome: tolerated well, no immediate complications Procedure, treatment alternatives, risks and benefits explained, specific risks discussed. Consent was given by the patient. Immediately prior to procedure a time out was called to verify the correct patient, procedure, equipment, support staff and site/side marked as required. Patient was prepped and draped in the usual sterile fashion.   Large Joint Inj: R subacromial  bursa on 09/16/2017 11:19 AM Indications: pain Details: 22 G 1.5 in needle, superior approach  Arthrogram: No  Medications: 3 mL lidocaine 1 %; 40 mg methylPREDNISolone acetate 40 MG/ML Outcome: tolerated well, no immediate complications Procedure, treatment alternatives, risks and benefits explained, specific risks discussed. Consent was given by the patient. Immediately prior to procedure a time out was called to verify the correct patient, procedure, equipment, support staff and site/side marked as required. Patient was prepped and draped in the usual sterile fashion.       Clinical Data: No additional findings.   Subjective: Chief Complaint  Patient presents with  . Right Knee - Follow-up    HPI [name] is well-known to Dr. Ninfa Linden service comes in today for follow-up of her left knee pain.  She states that her knee gave out recently and she fell on both knees.  She is also having pain left hip and her right shoulder.  Fortunately she has been unable to get to physical therapy for her right shoulder impingement. She has known significant arthritis of her left knee underwent a knee arthroscopy 2 years ago.  She is tried conservative treatment which included steroid injections without significant significant relief.  She is using some CBD oil on her knee and states that it helps some.  She has a  history of bilateral total hip arthroplasties the left hip was done some 3 years ago.  She had no particular injury to the hip.  Painful whenever she lies on the hip. Review of Systems Please see HPI otherwise negative  Objective: Vital Signs: There were no vitals taken for this visit.  Physical Exam  Constitutional: She is oriented to person, place, and time. She appears well-developed and well-nourished. No distress.  Pulmonary/Chest: Effort normal.  Neurological: She is alert and oriented to person, place, and time.  Skin: She is not diaphoretic.  Psychiatric: She has a normal  mood and affect.    Ortho Exam Left knee good range of motion.  Tenderness along the medial joint line.  Crepitus patellofemoral region with range of motion no instability.  Left hip she has good range of motion left hip with tenderness over the greater trochanteric region.  Bilateral shoulder she has 5 out of 5 strength with external and internal rotation against resistance positive impingement on the right.  She has full forward flexion but has pain with the last few degrees.  Empty can test negative bilaterally. Specialty Comments:  No specialty comments available.  Imaging: Xr Knee 1-2 Views Left  Result Date: 09/16/2017 Left knee AP lateral views: Knee is well located.  No acute fractures.  Moderate medial compartmental narrowing mild to moderate patellofemoral changes.       PMFS History: Patient Active Problem List   Diagnosis Date Noted  . Chronic right shoulder pain 08/05/2017  . Chronic left shoulder pain 08/05/2017  . Chronic pain of right knee 08/05/2017  . Chronic pain of both shoulders 06/10/2017  . Chronic pain of left knee 06/10/2017  . Status post arthroscopy of left knee 10/24/2016  . Acute pyelonephritis 08/18/2016  . Chest pressure 08/18/2016  . Essential hypertension 08/18/2016  . Hyponatremia 08/18/2016  . Diastolic dysfunction 82/42/3536  . AKI (acute kidney injury) ([place]) 08/18/2016  . Sepsis ([place])   . Acute lower UTI   . Tachycardia   . Osteoarthritis of right hip 02/17/2015  . 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 Atrial tachycardia ([place]) 12/12/2014  . 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 Recurrent dislocation of hip 10/17/2014  . Dislocation of internal left hip prosthesis ([place]) 10/16/2014  . Ectopic atrial rhythm 10/12/2014  . Atrial bigeminy 10/12/2014  . Osteoarthritis of left hip 09/16/2014  . Status post total replacement of left hip 09/16/2014  . ASCUS favoring benign 09/05/2014  . History of  vitamin B deficiency 05/02/2014  . Acute bronchitis 04/18/2014  . Oral aphthous ulcer 04/18/2014  . Edema 12/21/2013  . Irregular heart beat 12/21/2013  . Anuria 11/12/2013  . History of cervical dysplasia 09/21/2013  . Rectocele, female 09/21/2013  . Kidney stone 09/21/2013  . Rash and nonspecific skin eruption 06/25/2013  . Obesity (BMI 30-39.9) 06/16/2013  . IBS (irritable bowel syndrome) 12/22/2012  . Other sleep disturbances 10/27/2012  . Elevated BP 09/29/2012  . Weight gain 08/17/2012  . Osteopenia 08/17/2012  . Vulvar lesion 08/10/2012  . 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 Condyloma acuminatum in female 07/14/2012  . Shingles 07/14/2012  . Vitamin D deficiency 07/14/2012   Past Medical History:  Diagnosis Date  . Arthritis   . DDD (degenerative disc disease), lumbosacral   . Diverticulosis   . Falls    hx of   . GERD (gastroesophageal reflux disease)   . H/O hiatal hernia   . History of diverticulitis of  colon   . History of epilepsy    childhood age 72 to 73  ---secondary to high calcium deposts of optic nerve---  none since age 41 with pregency  . History of gastric ulcer   . History of kidney stones   . History of shingles    MARCH 2014--  BACK AREA  . History of vulvar dysplasia    S/P  LASER ABLATION 2014  . IBS (irritable bowel syndrome)   . Mild obstructive sleep apnea    SLEEP STUDY  11-19-2012--  NO CPAP RX (DID NOT MEET CRITIRIA)  . Nodule of right lung    BENIGN--- AND STABLE PER  CT  11/2013  . Osteoporosis   . Renal cyst, left    STABLE PER CT 11/2013  . Seizures ([place])   . 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 Heart disease Mother   . Tuberous sclerosis Mother   . Uterine cancer Mother   . Tuberous sclerosis Maternal Grandmother   . Tuberous sclerosis Sister   . Tuberous sclerosis Daughter   .  Tuberous sclerosis Son        2 sons  . Tuberous sclerosis Maternal Aunt   . Tuberous sclerosis Maternal Aunt   . Tuberous sclerosis Maternal Aunt   . Tuberous sclerosis Cousin     Past Surgical History:  Procedure Laterality Date  . ANTERIOR HIP REVISION Left 10/18/2014   Procedure: EXCHANGE LEFT HIP BALL OF DIRECT ANTERIOR TOTAL HIP ARTHROPLASTY;  Surgeon: Mcarthur Rossetti, MD;  Location: [facility];  Service: Orthopedics;  Laterality: Left;  . AUGMENTATION MAMMAPLASTY     SALINE  . BLADDER SURGERY  1991   bladder reconstruction of torsion  . CARDIAC CATHETERIZATION  06-26-2001   NORMAL CORONARY ARTERIES  . CATARACT EXTRACTION W/ INTRAOCULAR LENS  IMPLANT, BILATERAL    . CHOLECYSTECTOMY  11-18-2003  . CO2 LASER APPLICATION N/A 08/05/7423   Procedure: CO2 LASER APPLICATION;  Surgeon: Terrance Mass, MD;  Location[facility];  Service: Gynecology;  Laterality: N/A;CO2 laser of vaginal and vulvar lesions  . COLONOSCOPY    . CYSTOSCOPY WITH RETROGRADE PYELOGRAM, URETEROSCOPY AND STENT PLACEMENT Right 11/22/2013   Procedure: CYSTOSCOPY WITH RETROGRADE PYELOGRAM, URETEROSCOPY AND STENT PLACEMENT;  Surgeon: Sharyn Creamer, MD;  Location: [facility];  Service: Urology;  Laterality: Right;  . CYSTOSCOPY/RETROGRADE/URETEROSCOPY Right 12/01/2013   Procedure: CYSTOSCOPY, RIGHT RETROGRADE/RIGHT DIGITAL URETEROSCOPY, RIGHT URETERAL STENT EXCHANGE;  Surgeon: Sharyn Creamer, MD;  Location[facility];  Service: Urology;  Laterality: Right;  STAGED RIGHT URETEROSCOPY RIGHT URETER DILATION    . DILATION AND CURETTAGE OF UTERUS  multiple -- last one 1975  . GYNECOLOGIC CRYOSURGERY    . HEMORRHOID SURGERY  1970's  . HIP CLOSED REDUCTION Left 10/16/2014   Procedure: CLOSED MANIPULATION HIP;  Surgeon: Mcarthur Rossetti, MD;  Location: [facility];  Service: Orthopedics;  Laterality: Left;  . HOLMIUM LASER APPLICATION Right 02/05/6386   Procedure:  HOLMIUM LASER APPLICATION;  Surgeon: Sharyn Creamer, MD;  Location[facility];  Service: Urology;  Laterality: Right;  . KNEE SURGERY  11/2016  . [place] SURGERY  1985  . ORIF RIGHT BIMALLEOLAR ANKLE FX  12-18-2003  . TOTAL HIP ARTHROPLASTY Left 09/16/2014   Procedure: LEFT TOTAL HIP ARTHROPLASTY ANTERIOR APPROACH;  Surgeon: Harrell Gave  Kerry Fort, MD;  Location: [facility];  Service: Orthopedics;  Laterality: Left;  . TOTAL HIP ARTHROPLASTY Right 02/17/2015   Procedure: RIGHT TOTAL HIP ARTHROPLASTY ANTERIOR APPROACH;  Surgeon: Mcarthur Rossetti, MD;  Location: [facility];  Service: Orthopedics;  Laterality: Right;  . UPPER GASTROINTESTINAL ENDOSCOPY    . VAGINAL HYSTERECTOMY  1975  . WIDE LOCAL EXCISION OF FOURCHETTE AND PERINEUM  04-06-2009   VULVAR CARCINOMA IN SITU   Social History   Occupational History  . Occupation: Education administrator: OTHER    Comment: [employer]  Tobacco Use  . Smoking status: Former Smoker    Packs/day: 2.00    Years: 25.00    Pack years: 50.00    Types: Cigarettes    Last attempt to quit: 06/03/1998    Years since quitting: 19.3  . Smokeless tobacco: Never Used  Substance and Sexual Activity  . Alcohol use: Yes    Comment: rarely has a glass of wine  . Drug use: No  . Sexual activity: Not Currently    Partners: Male

## 2017-09-23 ENCOUNTER — Ambulatory Visit: Payer: Medicare Other | Admitting: Cardiology

## 2017-10-07 DIAGNOSIS — R202 Paresthesia of skin: Secondary | ICD-10-CM | POA: Diagnosis not present

## 2017-10-07 DIAGNOSIS — K580 Irritable bowel syndrome with diarrhea: Secondary | ICD-10-CM | POA: Diagnosis not present

## 2017-10-07 DIAGNOSIS — E7800 Pure hypercholesterolemia, unspecified: Secondary | ICD-10-CM | POA: Diagnosis not present

## 2017-10-07 DIAGNOSIS — R5383 Other fatigue: Secondary | ICD-10-CM | POA: Diagnosis not present

## 2017-10-07 DIAGNOSIS — Q851 Tuberous sclerosis: Secondary | ICD-10-CM | POA: Diagnosis not present

## 2017-10-07 DIAGNOSIS — K219 Gastro-esophageal reflux disease without esophagitis: Secondary | ICD-10-CM | POA: Diagnosis not present

## 2017-10-07 DIAGNOSIS — M810 Age-related osteoporosis without current pathological fracture: Secondary | ICD-10-CM | POA: Diagnosis not present

## 2017-10-07 DIAGNOSIS — R001 Bradycardia, unspecified: Secondary | ICD-10-CM | POA: Diagnosis not present

## 2017-10-07 DIAGNOSIS — M064 Inflammatory polyarthropathy: Secondary | ICD-10-CM | POA: Diagnosis not present

## 2017-10-07 DIAGNOSIS — Z87442 Personal history of urinary calculi: Secondary | ICD-10-CM | POA: Diagnosis not present

## 2017-10-07 DIAGNOSIS — E559 Vitamin D deficiency, unspecified: Secondary | ICD-10-CM | POA: Diagnosis not present

## 2017-10-21 DIAGNOSIS — M150 Primary generalized (osteo)arthritis: Secondary | ICD-10-CM | POA: Diagnosis not present

## 2017-10-21 DIAGNOSIS — R5383 Other fatigue: Secondary | ICD-10-CM | POA: Diagnosis not present

## 2017-10-21 DIAGNOSIS — Q851 Tuberous sclerosis: Secondary | ICD-10-CM | POA: Diagnosis not present

## 2017-10-21 DIAGNOSIS — E559 Vitamin D deficiency, unspecified: Secondary | ICD-10-CM | POA: Diagnosis not present

## 2017-10-21 DIAGNOSIS — K219 Gastro-esophageal reflux disease without esophagitis: Secondary | ICD-10-CM | POA: Diagnosis not present

## 2017-11-05 DIAGNOSIS — N281 Cyst of kidney, acquired: Secondary | ICD-10-CM | POA: Diagnosis not present

## 2017-11-11 DIAGNOSIS — K580 Irritable bowel syndrome with diarrhea: Secondary | ICD-10-CM | POA: Diagnosis not present

## 2017-11-11 DIAGNOSIS — M064 Inflammatory polyarthropathy: Secondary | ICD-10-CM | POA: Diagnosis not present

## 2017-11-11 DIAGNOSIS — R413 Other amnesia: Secondary | ICD-10-CM | POA: Diagnosis not present

## 2017-11-11 DIAGNOSIS — Q851 Tuberous sclerosis: Secondary | ICD-10-CM | POA: Diagnosis not present

## 2017-11-13 DIAGNOSIS — B0089 Other herpesviral infection: Secondary | ICD-10-CM | POA: Diagnosis not present

## 2017-11-13 DIAGNOSIS — K1379 Other lesions of oral mucosa: Secondary | ICD-10-CM | POA: Diagnosis not present

## 2017-11-17 DIAGNOSIS — Z882 Allergy status to sulfonamides status: Secondary | ICD-10-CM | POA: Diagnosis not present

## 2017-11-17 DIAGNOSIS — G4489 Other headache syndrome: Secondary | ICD-10-CM | POA: Diagnosis not present

## 2017-11-17 DIAGNOSIS — Z888 Allergy status to other drugs, medicaments and biological substances status: Secondary | ICD-10-CM | POA: Diagnosis not present

## 2017-11-17 DIAGNOSIS — R0602 Shortness of breath: Secondary | ICD-10-CM | POA: Diagnosis not present

## 2017-11-17 DIAGNOSIS — R0609 Other forms of dyspnea: Secondary | ICD-10-CM | POA: Diagnosis not present

## 2017-11-17 DIAGNOSIS — K219 Gastro-esophageal reflux disease without esophagitis: Secondary | ICD-10-CM | POA: Diagnosis not present

## 2017-11-17 DIAGNOSIS — I499 Cardiac arrhythmia, unspecified: Secondary | ICD-10-CM | POA: Diagnosis not present

## 2017-11-17 DIAGNOSIS — R002 Palpitations: Secondary | ICD-10-CM | POA: Diagnosis not present

## 2017-11-17 DIAGNOSIS — R0600 Dyspnea, unspecified: Secondary | ICD-10-CM | POA: Diagnosis not present

## 2017-11-17 DIAGNOSIS — I4589 Other specified conduction disorders: Secondary | ICD-10-CM | POA: Diagnosis not present

## 2017-11-17 DIAGNOSIS — R0789 Other chest pain: Secondary | ICD-10-CM | POA: Diagnosis not present

## 2017-11-17 DIAGNOSIS — R079 Chest pain, unspecified: Secondary | ICD-10-CM | POA: Diagnosis not present

## 2017-11-17 DIAGNOSIS — Z91041 Radiographic dye allergy status: Secondary | ICD-10-CM | POA: Diagnosis not present

## 2017-11-17 DIAGNOSIS — Z79899 Other long term (current) drug therapy: Secondary | ICD-10-CM | POA: Diagnosis not present

## 2017-11-17 DIAGNOSIS — M1990 Unspecified osteoarthritis, unspecified site: Secondary | ICD-10-CM | POA: Diagnosis not present

## 2017-11-18 DIAGNOSIS — G9389 Other specified disorders of brain: Secondary | ICD-10-CM | POA: Diagnosis not present

## 2017-11-18 DIAGNOSIS — R413 Other amnesia: Secondary | ICD-10-CM | POA: Diagnosis not present

## 2017-11-18 DIAGNOSIS — I679 Cerebrovascular disease, unspecified: Secondary | ICD-10-CM | POA: Diagnosis not present

## 2017-11-26 DIAGNOSIS — R5383 Other fatigue: Secondary | ICD-10-CM | POA: Diagnosis not present

## 2017-11-26 DIAGNOSIS — R079 Chest pain, unspecified: Secondary | ICD-10-CM | POA: Diagnosis not present

## 2017-11-26 DIAGNOSIS — R413 Other amnesia: Secondary | ICD-10-CM | POA: Diagnosis not present

## 2017-11-26 DIAGNOSIS — I672 Cerebral atherosclerosis: Secondary | ICD-10-CM | POA: Diagnosis not present

## 2017-11-26 DIAGNOSIS — Z7189 Other specified counseling: Secondary | ICD-10-CM | POA: Diagnosis not present

## 2017-11-26 DIAGNOSIS — Z96643 Presence of artificial hip joint, bilateral: Secondary | ICD-10-CM | POA: Diagnosis not present

## 2017-11-26 DIAGNOSIS — Z6834 Body mass index (BMI) 34.0-34.9, adult: Secondary | ICD-10-CM | POA: Diagnosis not present

## 2017-11-26 DIAGNOSIS — R002 Palpitations: Secondary | ICD-10-CM | POA: Diagnosis not present

## 2017-11-26 DIAGNOSIS — Q851 Tuberous sclerosis: Secondary | ICD-10-CM | POA: Diagnosis not present

## 2017-11-26 DIAGNOSIS — K580 Irritable bowel syndrome with diarrhea: Secondary | ICD-10-CM | POA: Diagnosis not present

## 2017-11-26 DIAGNOSIS — E663 Overweight: Secondary | ICD-10-CM | POA: Diagnosis not present

## 2017-12-09 DIAGNOSIS — R079 Chest pain, unspecified: Secondary | ICD-10-CM | POA: Diagnosis not present

## 2017-12-09 DIAGNOSIS — R000 Tachycardia, unspecified: Secondary | ICD-10-CM | POA: Diagnosis not present

## 2017-12-09 DIAGNOSIS — R008 Other abnormalities of heart beat: Secondary | ICD-10-CM | POA: Diagnosis not present

## 2017-12-16 DIAGNOSIS — I361 Nonrheumatic tricuspid (valve) insufficiency: Secondary | ICD-10-CM | POA: Diagnosis not present

## 2017-12-16 DIAGNOSIS — K589 Irritable bowel syndrome without diarrhea: Secondary | ICD-10-CM | POA: Diagnosis not present

## 2017-12-16 DIAGNOSIS — R197 Diarrhea, unspecified: Secondary | ICD-10-CM | POA: Diagnosis not present

## 2017-12-16 DIAGNOSIS — I340 Nonrheumatic mitral (valve) insufficiency: Secondary | ICD-10-CM | POA: Diagnosis not present

## 2017-12-31 DIAGNOSIS — R5383 Other fatigue: Secondary | ICD-10-CM | POA: Diagnosis not present

## 2017-12-31 DIAGNOSIS — E559 Vitamin D deficiency, unspecified: Secondary | ICD-10-CM | POA: Diagnosis not present

## 2017-12-31 DIAGNOSIS — E782 Mixed hyperlipidemia: Secondary | ICD-10-CM | POA: Diagnosis not present

## 2017-12-31 DIAGNOSIS — I672 Cerebral atherosclerosis: Secondary | ICD-10-CM | POA: Diagnosis not present

## 2017-12-31 DIAGNOSIS — E7211 Homocystinuria: Secondary | ICD-10-CM | POA: Diagnosis not present

## 2018-01-13 DIAGNOSIS — R5383 Other fatigue: Secondary | ICD-10-CM | POA: Diagnosis not present

## 2018-01-13 DIAGNOSIS — E7800 Pure hypercholesterolemia, unspecified: Secondary | ICD-10-CM | POA: Diagnosis not present

## 2018-01-13 DIAGNOSIS — Q851 Tuberous sclerosis: Secondary | ICD-10-CM | POA: Diagnosis not present

## 2018-01-13 DIAGNOSIS — G729 Myopathy, unspecified: Secondary | ICD-10-CM | POA: Diagnosis not present

## 2018-01-13 DIAGNOSIS — E7211 Homocystinuria: Secondary | ICD-10-CM | POA: Diagnosis not present

## 2018-01-13 DIAGNOSIS — D509 Iron deficiency anemia, unspecified: Secondary | ICD-10-CM | POA: Diagnosis not present

## 2018-01-13 DIAGNOSIS — M064 Inflammatory polyarthropathy: Secondary | ICD-10-CM | POA: Diagnosis not present

## 2018-01-13 DIAGNOSIS — E611 Iron deficiency: Secondary | ICD-10-CM | POA: Diagnosis not present

## 2018-01-13 DIAGNOSIS — I672 Cerebral atherosclerosis: Secondary | ICD-10-CM | POA: Diagnosis not present

## 2018-01-13 DIAGNOSIS — E559 Vitamin D deficiency, unspecified: Secondary | ICD-10-CM | POA: Diagnosis not present

## 2018-01-21 DIAGNOSIS — E7211 Homocystinuria: Secondary | ICD-10-CM | POA: Diagnosis not present

## 2018-01-21 DIAGNOSIS — R5383 Other fatigue: Secondary | ICD-10-CM | POA: Diagnosis not present

## 2018-01-21 DIAGNOSIS — K580 Irritable bowel syndrome with diarrhea: Secondary | ICD-10-CM | POA: Diagnosis not present

## 2018-01-21 DIAGNOSIS — Q851 Tuberous sclerosis: Secondary | ICD-10-CM | POA: Diagnosis not present

## 2018-01-28 DIAGNOSIS — K219 Gastro-esophageal reflux disease without esophagitis: Secondary | ICD-10-CM | POA: Diagnosis not present

## 2018-01-28 DIAGNOSIS — Z1211 Encounter for screening for malignant neoplasm of colon: Secondary | ICD-10-CM | POA: Diagnosis not present

## 2018-01-28 DIAGNOSIS — Z86010 Personal history of colonic polyps: Secondary | ICD-10-CM | POA: Diagnosis not present

## 2018-01-28 DIAGNOSIS — R0683 Snoring: Secondary | ICD-10-CM | POA: Diagnosis not present

## 2018-01-28 DIAGNOSIS — Z87891 Personal history of nicotine dependence: Secondary | ICD-10-CM | POA: Diagnosis not present

## 2018-01-28 DIAGNOSIS — D125 Benign neoplasm of sigmoid colon: Secondary | ICD-10-CM | POA: Diagnosis not present

## 2018-01-28 DIAGNOSIS — K589 Irritable bowel syndrome without diarrhea: Secondary | ICD-10-CM | POA: Diagnosis not present

## 2018-01-28 DIAGNOSIS — R569 Unspecified convulsions: Secondary | ICD-10-CM | POA: Diagnosis not present

## 2018-01-28 DIAGNOSIS — Z79899 Other long term (current) drug therapy: Secondary | ICD-10-CM | POA: Diagnosis not present

## 2018-01-28 DIAGNOSIS — K5730 Diverticulosis of large intestine without perforation or abscess without bleeding: Secondary | ICD-10-CM | POA: Diagnosis not present

## 2018-01-28 DIAGNOSIS — K5140 Inflammatory polyps of colon without complications: Secondary | ICD-10-CM | POA: Diagnosis not present

## 2018-01-29 DIAGNOSIS — D125 Benign neoplasm of sigmoid colon: Secondary | ICD-10-CM | POA: Diagnosis not present

## 2018-02-10 ENCOUNTER — Encounter (INDEPENDENT_AMBULATORY_CARE_PROVIDER_SITE_OTHER): Payer: Self-pay | Admitting: Orthopaedic Surgery

## 2018-02-10 ENCOUNTER — Ambulatory Visit (INDEPENDENT_AMBULATORY_CARE_PROVIDER_SITE_OTHER): Payer: Medicare Other | Admitting: Orthopaedic Surgery

## 2018-02-10 ENCOUNTER — Ambulatory Visit (INDEPENDENT_AMBULATORY_CARE_PROVIDER_SITE_OTHER): Payer: Medicare Other

## 2018-02-10 DIAGNOSIS — M7541 Impingement syndrome of right shoulder: Secondary | ICD-10-CM

## 2018-02-10 DIAGNOSIS — M25562 Pain in left knee: Secondary | ICD-10-CM

## 2018-02-10 DIAGNOSIS — G8929 Other chronic pain: Secondary | ICD-10-CM

## 2018-02-10 MED ORDER — METHYLPREDNISOLONE ACETATE 40 MG/ML IJ SUSP
40.0000 mg | INTRAMUSCULAR | Status: AC | PRN
  Administered 2018-02-10: 40 mg via INTRA_ARTICULAR

## 2018-02-10 MED ORDER — LIDOCAINE HCL 1 % IJ SOLN
3.0000 mL | INTRAMUSCULAR | Status: AC | PRN
  Administered 2018-02-10: 3 mL

## 2018-02-10 NOTE — Progress Notes (Signed)
Office Visit Note   Patient: [name]           Date of Birth: 1946/04/18           MRN: [id number] Visit Date: 02/10/2018              Requested by: Carol Ada, MD [address], [place] [id number] PCP: Larence Penning, MD   Assessment & Plan: Visit Diagnoses:  1. Chronic pain of left knee   2. Impingement syndrome of right shoulder     Plan: As far as her left knee and thigh pain, I would recommend mainly just quad strengthening exercises and activity modification as well as trying a cane for at least a month straight in her opposite hand.  I did provide a steroid injection in her right shoulder which is helped significantly in the past.  The only other intervention to consider for her right shoulder would be shoulder arthroscopy with subacromial decompression debridement if he gets to the point where the injections are not working.  All questions concerns were answered and addressed.  Follow-up will otherwise be as needed.  Follow-Up Instructions: Return if symptoms worsen or fail to improve.   Orders:  Orders Placed This Encounter  Procedures  . Large Joint Inj  . XR KNEE 3 VIEW LEFT   No orders of the defined types were placed in this encounter.     Procedures: Large Joint Inj: R subacromial bursa on 02/10/2018 9:44 AM Indications: pain and diagnostic evaluation Details: 22 G 1.5 in needle  Arthrogram: No  Medications: 3 mL lidocaine 1 %; 40 mg methylPREDNISolone acetate 40 MG/ML Outcome: tolerated well, no immediate complications Procedure, treatment alternatives, risks and benefits explained, specific risks discussed. Consent was given by the patient. Immediately prior to procedure a time out was called to verify the correct patient, procedure, equipment, support staff and site/side marked as required. Patient was prepped and draped in the usual sterile fashion.       Clinical Data: No additional findings.   Subjective: Chief  Complaint  Patient presents with  . Left Knee - Pain  The patient is well-known to me.  She is a 72 year old female with a history of bilateral hip replacements.  She has also had a left knee arthroscopy for medial meniscal tear and thinning of the cartilage of her knee.  She is been having about 2 months worth of left knee and quad pain after an accident when she hit her knee getting into a truck.  Said the pain is really not eased up and it does wake her up at night.  She also has chronic right shoulder pain with known rotator cuff tear.  She also has shoulder impingement and does get injection from time to time her right shoulder.  It is been very long time since in the injection.  Is been at least over 6 months to year.  She would like to have a steroid injection in her right shoulder today.  She is still not interested in any type of surgical intervention for the shoulder.  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  HPI  Review of Systems She currently denies any headache, chest pain, short of breath, fever, chills, nausea, vomiting.  Objective: Vital Signs: There were no vitals taken for this visit.  Physical Exam She is alert and oriented x3 and in no acute distress Ortho Exam Examination of her left knee and thigh shows that her  extensor mechanism is intact and normal.  She is flexing the knee easily.  There is no effusion.  Her knee is ligamentously stable.  Examination of her right shoulder shows her to move smoothly with only some deficit of the rotator cuff mainly pain. Specialty Comments:  No specialty comments available.  Imaging: Xr Knee 3 View Left  Result Date: 02/10/2018 3 views left knee show no acute findings.  The joint space is still well maintained.  There is slight calcifications along the lateral meniscus.  Her bone appears osteopenic.    PMFS History: Patient Active Problem List   Diagnosis Date Noted  . Impingement syndrome of right shoulder 02/10/2018    . Chronic right shoulder pain 08/05/2017  . Chronic left shoulder pain 08/05/2017  . Chronic pain of right knee 08/05/2017  . Chronic pain of both shoulders 06/10/2017  . Chronic pain of left knee 06/10/2017  . Status post arthroscopy of left knee 10/24/2016  . Acute pyelonephritis 08/18/2016  . Chest pressure 08/18/2016  . Essential hypertension 08/18/2016  . Hyponatremia 08/18/2016  . Diastolic dysfunction 02/23/3006  . AKI (acute kidney injury) ([place]) 08/18/2016  . Sepsis ([place])   . Acute lower UTI   . Tachycardia   . Osteoarthritis of right hip 02/17/2015  . 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 Atrial tachycardia ([place]) 12/12/2014  . 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 Recurrent dislocation of hip 10/17/2014  . Dislocation of internal left hip prosthesis ([place]) 10/16/2014  . Ectopic atrial rhythm 10/12/2014  . Atrial bigeminy 10/12/2014  . Osteoarthritis of left hip 09/16/2014  . Status post total replacement of left hip 09/16/2014  . ASCUS favoring benign 09/05/2014  . History of vitamin B deficiency 05/02/2014  . Acute bronchitis 04/18/2014  . Oral aphthous ulcer 04/18/2014  . Edema 12/21/2013  . Irregular heart beat 12/21/2013  . Anuria 11/12/2013  . History of cervical dysplasia 09/21/2013  . Rectocele, female 09/21/2013  . Kidney stone 09/21/2013  . Rash and nonspecific skin eruption 06/25/2013  . Obesity (BMI 30-39.9) 06/16/2013  . IBS (irritable bowel syndrome) 12/22/2012  . Other sleep disturbances 10/27/2012  . Elevated BP 09/29/2012  . Weight gain 08/17/2012  . Osteopenia 08/17/2012  . Vulvar lesion 08/10/2012  . 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 Condyloma acuminatum in female 07/14/2012  . Shingles 07/14/2012  . Vitamin D deficiency 07/14/2012   Past Medical History:  Diagnosis Date  . Arthritis   . DDD (degenerative disc disease), lumbosacral   . Diverticulosis   . Falls    hx of   . GERD  (gastroesophageal reflux disease)   . H/O hiatal hernia   . History of diverticulitis of colon   . History of epilepsy    childhood age 12 to 89  ---secondary to high calcium deposts of optic nerve---  none since age 34 with pregency  . History of gastric ulcer   . History of kidney stones   . History of shingles    MARCH 2014--  BACK AREA  . History of vulvar dysplasia    S/P  LASER ABLATION 2014  . IBS (irritable bowel syndrome)   . Mild obstructive sleep apnea    SLEEP STUDY  11-19-2012--  NO CPAP RX (DID NOT MEET CRITIRIA)  . Nodule of right lung    BENIGN--- AND STABLE PER  CT  11/2013  . Osteoporosis   . Renal cyst, left    STABLE PER CT 11/2013  .  Seizures ([place])   . 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 Heart disease Mother   . Tuberous sclerosis Mother   . Uterine cancer Mother   . Tuberous sclerosis Maternal Grandmother   . Tuberous sclerosis Sister   . Tuberous sclerosis Daughter   . Tuberous sclerosis Son        2 sons  . Tuberous sclerosis Maternal Aunt   . Tuberous sclerosis Maternal Aunt   . Tuberous sclerosis Maternal Aunt   . Tuberous sclerosis Cousin     Past Surgical History:  Procedure Laterality Date  . ANTERIOR HIP REVISION Left 10/18/2014   Procedure: EXCHANGE LEFT HIP BALL OF DIRECT ANTERIOR TOTAL HIP ARTHROPLASTY;  Surgeon: Mcarthur Rossetti, MD;  Location: [place];  Service: Orthopedics;  Laterality: Left;  . AUGMENTATION MAMMAPLASTY     SALINE  . BLADDER SURGERY  1991   bladder reconstruction of torsion  . CARDIAC CATHETERIZATION  06-26-2001   NORMAL CORONARY ARTERIES  . CATARACT EXTRACTION W/ INTRAOCULAR LENS  IMPLANT, BILATERAL    . CHOLECYSTECTOMY  11-18-2003  . CO2 LASER APPLICATION N/A 11/03/2295   Procedure: CO2 LASER APPLICATION;  Surgeon: Terrance Mass, MD;  Location: [facility];  Service: Gynecology;  Laterality: N/A;CO2 laser of vaginal and vulvar lesions  . COLONOSCOPY    . CYSTOSCOPY WITH RETROGRADE PYELOGRAM, URETEROSCOPY AND STENT PLACEMENT Right 11/22/2013   Procedure: CYSTOSCOPY WITH RETROGRADE PYELOGRAM, URETEROSCOPY AND STENT PLACEMENT;  Surgeon: Sharyn Creamer, MD;  Location: [facility];  Service: Urology;  Laterality: Right;  . CYSTOSCOPY/RETROGRADE/URETEROSCOPY Right 12/01/2013   Procedure: CYSTOSCOPY, RIGHT RETROGRADE/RIGHT DIGITAL URETEROSCOPY, RIGHT URETERAL STENT EXCHANGE;  Surgeon: Sharyn Creamer, MD;  [facility];  Service: Urology;  Laterality: Right;  STAGED RIGHT URETEROSCOPY RIGHT URETER DILATION    . DILATION AND CURETTAGE OF UTERUS  multiple -- last one 1975  . GYNECOLOGIC CRYOSURGERY    . HEMORRHOID SURGERY  1970's  . HIP CLOSED REDUCTION Left 10/16/2014   Procedure: CLOSED MANIPULATION HIP;  Surgeon: Mcarthur Rossetti, MD;  Location: [facility];  Service: Orthopedics;  Laterality: Left;  . HOLMIUM LASER APPLICATION Right 02/08/9210   Procedure: HOLMIUM LASER APPLICATION;  Surgeon: Sharyn Creamer, MD;  Location: [facility];  Service: Urology;  Laterality: Right;  . KNEE SURGERY  11/2016  . [place] SURGERY  1985  . ORIF RIGHT BIMALLEOLAR ANKLE FX  12-18-2003  . TOTAL HIP ARTHROPLASTY Left 09/16/2014   Procedure: LEFT TOTAL HIP ARTHROPLASTY ANTERIOR APPROACH;  Surgeon: Mcarthur Rossetti, MD;  Location: [facility];  Service: Orthopedics;  Laterality: Left;  . TOTAL HIP ARTHROPLASTY Right 02/17/2015   Procedure: RIGHT TOTAL HIP ARTHROPLASTY ANTERIOR APPROACH;  Surgeon: Mcarthur Rossetti, MD;  Location[facility];  Service: Orthopedics;  Laterality: Right;  . UPPER GASTROINTESTINAL ENDOSCOPY    . VAGINAL HYSTERECTOMY  1975  . WIDE LOCAL EXCISION OF FOURCHETTE AND PERINEUM  04-06-2009   VULVAR CARCINOMA IN SITU   Social History   Occupational History  . Occupation:  Education administrator: OTHER    Comment: [employer]  Tobacco Use  . Smoking status: Former Smoker    Packs/day: 2.00    Years: 25.00    Pack years: 50.00    Types: Cigarettes    Last attempt to quit:  06/03/1998    Years since quitting: 19.7  . Smokeless tobacco: Never Used  Substance and Sexual Activity  . Alcohol use: Yes    Comment: rarely has a glass of wine  . Drug use: No  . Sexual activity: Not Currently    Partners: Male

## 2018-02-17 ENCOUNTER — Ambulatory Visit (INDEPENDENT_AMBULATORY_CARE_PROVIDER_SITE_OTHER): Payer: Medicare Other | Admitting: Orthopaedic Surgery

## 2018-02-24 ENCOUNTER — Ambulatory Visit (INDEPENDENT_AMBULATORY_CARE_PROVIDER_SITE_OTHER): Payer: Medicare Other | Admitting: Orthopaedic Surgery

## 2018-02-24 ENCOUNTER — Telehealth (INDEPENDENT_AMBULATORY_CARE_PROVIDER_SITE_OTHER): Payer: Self-pay

## 2018-02-24 ENCOUNTER — Encounter (INDEPENDENT_AMBULATORY_CARE_PROVIDER_SITE_OTHER): Payer: Self-pay | Admitting: Orthopaedic Surgery

## 2018-02-24 DIAGNOSIS — Z8669 Personal history of other diseases of the nervous system and sense organs: Secondary | ICD-10-CM

## 2018-02-24 DIAGNOSIS — M25562 Pain in left knee: Secondary | ICD-10-CM

## 2018-02-24 DIAGNOSIS — G8929 Other chronic pain: Secondary | ICD-10-CM

## 2018-02-24 DIAGNOSIS — Z87898 Personal history of other specified conditions: Secondary | ICD-10-CM

## 2018-02-24 MED ORDER — LIDOCAINE HCL 1 % IJ SOLN
3.0000 mL | INTRAMUSCULAR | Status: AC | PRN
  Administered 2018-02-24: 3 mL

## 2018-02-24 MED ORDER — METHYLPREDNISOLONE ACETATE 40 MG/ML IJ SUSP
40.0000 mg | INTRAMUSCULAR | Status: AC | PRN
  Administered 2018-02-24: 40 mg via INTRA_ARTICULAR

## 2018-02-24 NOTE — Progress Notes (Signed)
Office Visit Note   Patient: [name]           Date of Birth: [birth year]           MRN: [id number] Visit Date: 02/24/2018              Requested by: Larence Penning, MD [address] [place], [place] [address] PCP: Larence Penning, MD   Assessment & Plan: Visit Diagnoses:  1. Chronic pain of left knee   2. H/O tuberous sclerosis     Plan: See her back in 2 weeks to see her response to the left knee injection.  She may require an MRI of the knee to assess the cartilage of her knee as plain films show no significant arthritic changes.  In regards to her tuberous sclerosis we will try to find the appropriate physician to refer her to for this.  Again she was treated by neurologist here in town retired reportedly from the patient records are not available.  Follow-Up Instructions: Return in about 2 weeks (around 03/10/2018).   Orders:  Orders Placed This Encounter  Procedures  . Large Joint Inj   No orders of the defined types were placed in this encounter.     Procedures: Large Joint Inj: L knee on 02/24/2018 11:16 AM Indications: pain Details: 22 G 1.5 in needle, anterolateral approach  Arthrogram: No  Medications: 3 mL lidocaine 1 %; 40 mg methylPREDNISolone acetate 40 MG/ML Outcome: tolerated well, no immediate complications Procedure, treatment alternatives, risks and benefits explained, specific risks discussed. Consent was given by the patient. Immediately prior to procedure a time out was called to verify the correct patient, procedure, equipment, support staff and site/side marked as required. Patient was prepped and draped in the usual sterile fashion.       Clinical Data: No additional findings.   Subjective: Chief Complaint  Patient presents with  . Left Leg - Follow-up, Pain    HPI Mr. Meriweather is well-known to Dr. Ninfa Linden service comes in today complaining of left knee and thigh pain.  She reports that the right shoulder injection that she  received at last visit have not helped with her shoulder pain.  She is now having pain in her knee radiates up into her hip.  No numbness tingling.  She has pain constantly across the patella region. She also notes that she has a history of tuberous sclerosis with a strong family history.  She reports that Dr. love neurologist here in town who is retired was watching over her for this but he is since retired and records are unavailable per patient.  She has external lesions mainly on her hands and internal sclerotic bony lesions have been seen on previous lumbar and pelvis CT scans.  She is wanting a referral to a specialist who deals tuberous sclerosis.  She reports that her last epilepsy event was in 1969.  She reports that she has a half-sister that is "mentally retarded" due to the tuberous sclerosis. Review of Systems See HPI otherwise negative  Objective: Vital Signs: There were no vitals taken for this visit.  Physical Exam  Constitutional: She is oriented to person, place, and time. She appears well-developed and well-nourished. No distress.  Pulmonary/Chest: Effort normal.  Neurological: She is alert and oriented to person, place, and time.  Skin: She is not diaphoretic.  Psychiatric: She has a normal mood and affect.    Ortho Exam Left hip good range of motion without pain.  She has tenderness along medial lateral joint line of the left knee no effusion abnormal warmth erythema.  Overall good range of motion left knee with significant patellofemoral crepitus. Specialty Comments:  No specialty comments available.  Imaging: No results found.   PMFS History: Patient Active Problem List   Diagnosis Date Noted  . Impingement syndrome of right shoulder 02/10/2018  . Chronic right shoulder pain 08/05/2017  . Chronic left shoulder pain 08/05/2017  . Chronic pain of right knee 08/05/2017  . Chronic pain of both shoulders 06/10/2017  . Chronic pain of left knee 06/10/2017  . Status  post arthroscopy of left knee 10/24/2016  . Acute pyelonephritis 08/18/2016  . Chest pressure 08/18/2016  . Essential hypertension 08/18/2016  . Hyponatremia 08/18/2016  . Diastolic dysfunction 24/26/8341  . AKI (acute kidney injury) ([facility]) 08/18/2016  . Sepsis ([facility])   . Acute lower UTI   . Tachycardia   . Osteoarthritis of right hip 02/17/2015  . 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 Atrial tachycardia ([facility]) 12/12/2014  . 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 Recurrent dislocation of hip 10/17/2014  . Dislocation of internal left hip prosthesis ([facility]) 10/16/2014  . Ectopic atrial rhythm 10/12/2014  . Atrial bigeminy 10/12/2014  . Osteoarthritis of left hip 09/16/2014  . Status post total replacement of left hip 09/16/2014  . ASCUS favoring benign 09/05/2014  . History of vitamin B deficiency 05/02/2014  . Acute bronchitis 04/18/2014  . Oral aphthous ulcer 04/18/2014  . Edema 12/21/2013  . Irregular heart beat 12/21/2013  . Anuria 11/12/2013  . History of cervical dysplasia 09/21/2013  . Rectocele, female 09/21/2013  . Kidney stone 09/21/2013  . Rash and nonspecific skin eruption 06/25/2013  . Obesity (BMI 30-39.9) 06/16/2013  . IBS (irritable bowel syndrome) 12/22/2012  . Other sleep disturbances 10/27/2012  . Elevated BP 09/29/2012  . Weight gain 08/17/2012  . Osteopenia 08/17/2012  . Vulvar lesion 08/10/2012  . Tuberous sclerosis ([facility]) 07/14/2012  . Condyloma acuminatum in female 07/14/2012  . Shingles 07/14/2012  . Vitamin D deficiency 07/14/2012   Past Medical History:  Diagnosis Date  . Arthritis   . DDD (degenerative disc disease), lumbosacral   . Diverticulosis   . Falls    hx of   . GERD (gastroesophageal reflux disease)   . H/O hiatal hernia   . History of diverticulitis of colon   . History of epilepsy    childhood age 78 to 7  ---secondary to high calcium deposts of optic nerve---  none since age 59 with  pregency  . History of gastric ulcer   . History of kidney stones   . History of shingles    MARCH 2014--  BACK AREA  . History of vulvar dysplasia    S/P  LASER ABLATION 2014  . IBS (irritable bowel syndrome)   . Mild obstructive sleep apnea    SLEEP STUDY  11-19-2012--  NO CPAP RX (DID NOT MEET CRITIRIA)  . Nodule of right lung    BENIGN--- AND STABLE PER  CT  11/2013  . Osteoporosis   . Renal cyst, left    STABLE PER CT 11/2013  . Seizures ([facility])   . 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 Heart disease Mother   . Tuberous sclerosis Mother   . Uterine  cancer Mother   . Tuberous sclerosis Maternal Grandmother   . Tuberous sclerosis Sister   . Tuberous sclerosis Daughter   . Tuberous sclerosis Son        2 sons  . Tuberous sclerosis Maternal Aunt   . Tuberous sclerosis Maternal Aunt   . Tuberous sclerosis Maternal Aunt   . Tuberous sclerosis Cousin     Past Surgical History:  Procedure Laterality Date  . ANTERIOR HIP REVISION Left 10/18/2014   Procedure: EXCHANGE LEFT HIP BALL OF DIRECT ANTERIOR TOTAL HIP ARTHROPLASTY;  Surgeon: Mcarthur Rossetti, MD;  Location: [facility];  Service: Orthopedics;  Laterality: Left;  . AUGMENTATION MAMMAPLASTY     SALINE  . BLADDER SURGERY  1991   bladder reconstruction of torsion  . CARDIAC CATHETERIZATION  06-26-2001   NORMAL CORONARY ARTERIES  . CATARACT EXTRACTION W/ INTRAOCULAR LENS  IMPLANT, BILATERAL    . CHOLECYSTECTOMY  11-18-2003  . CO2 LASER APPLICATION N/A 0/08/5007   Procedure: CO2 LASER APPLICATION;  Surgeon: Terrance Mass, MD;  Location: [facility];  Service: Gynecology;  Laterality: N/A;CO2 laser of vaginal and vulvar lesions  . COLONOSCOPY    . CYSTOSCOPY WITH RETROGRADE PYELOGRAM, URETEROSCOPY AND STENT PLACEMENT Right 11/22/2013   Procedure: CYSTOSCOPY WITH  RETROGRADE PYELOGRAM, URETEROSCOPY AND STENT PLACEMENT;  Surgeon: Sharyn Creamer, MD;  Location: [facility];  Service: Urology;  Laterality: Right;  . CYSTOSCOPY/RETROGRADE/URETEROSCOPY Right 12/01/2013   Procedure: CYSTOSCOPY, RIGHT RETROGRADE/RIGHT DIGITAL URETEROSCOPY, RIGHT URETERAL STENT EXCHANGE;  Surgeon: Sharyn Creamer, MD;  Location[facility];  Service: Urology;  Laterality: Right;  STAGED RIGHT URETEROSCOPY RIGHT URETER DILATION    . DILATION AND CURETTAGE OF UTERUS  multiple -- last one 1975  . GYNECOLOGIC CRYOSURGERY    . HEMORRHOID SURGERY  1970's  . HIP CLOSED REDUCTION Left 10/16/2014   Procedure: CLOSED MANIPULATION HIP;  Surgeon: Mcarthur Rossetti, MD;  Location: [facility];  Service: Orthopedics;  Laterality: Left;  . HOLMIUM LASER APPLICATION Right 08/09/1827   Procedure: HOLMIUM LASER APPLICATION;  Surgeon: Sharyn Creamer, MD;  Location: [facility];  Service: Urology;  Laterality: Right;  . KNEE SURGERY  11/2016  . [place] SURGERY  1985  . ORIF RIGHT BIMALLEOLAR ANKLE FX  12-18-2003  . TOTAL HIP ARTHROPLASTY Left 09/16/2014   Procedure: LEFT TOTAL HIP ARTHROPLASTY ANTERIOR APPROACH;  Surgeon: Mcarthur Rossetti, MD;  Location: [facility];  Service: Orthopedics;  Laterality: Left;  . TOTAL HIP ARTHROPLASTY Right 02/17/2015   Procedure: RIGHT TOTAL HIP ARTHROPLASTY ANTERIOR APPROACH;  Surgeon: Mcarthur Rossetti, MD;  Location: [facility];  Service: Orthopedics;  Laterality: Right;  . UPPER GASTROINTESTINAL ENDOSCOPY    . VAGINAL HYSTERECTOMY  1975  . WIDE LOCAL EXCISION OF [place] AND PERINEUM  04-06-2009   VULVAR CARCINOMA IN SITU   Social History   Occupational History  . Occupation: Education administrator: OTHER    Comment: [employer]  Tobacco Use  . Smoking status: Former Smoker    Packs/day: 2.00    Years: 25.00    Pack years: 50.00    Types: Cigarettes    Last attempt to quit: 06/03/1998     Years since quitting: 19.7  . Smokeless tobacco: Never Used  Substance and Sexual Activity  . Alcohol use: Yes    Comment: rarely has a glass of wine  . Drug use: No  . Sexual activity: Not Currently    Partners: Male

## 2018-02-24 NOTE — Telephone Encounter (Signed)
Noted  

## 2018-02-24 NOTE — Telephone Encounter (Signed)
Noted Thanks lauren and brynn

## 2018-02-24 NOTE — Telephone Encounter (Signed)
Faxed referral

## 2018-02-24 NOTE — Telephone Encounter (Signed)
Artis Delay saw patient today for eval. During visit was determined that patient needed to be referred to [facility].  She previously was treated by Dr Erling Cruz for Tuberous Sclerosis and has not been followed since his retirement. Per patient, Dr Bernardo Heater former office shredded all of her records. I called [facility] and spoke with receptionist named Cordell Guercio who advised that the only physician that they had that treats this condition is Dr Wynn Maudlin. She advised his office made all of their own appointments and that we should reach out to them Will fax notes (once available from [facility]) with demographics and insurance info to [phone] for them to review and get patient scheduled to be seen. The phone number for the office there is [phone]

## 2018-02-24 NOTE — Telephone Encounter (Signed)
I have faxed referral, but am unable to edit referral notes nor can I see referral pool. Can either of you do this for me?

## 2018-02-24 NOTE — Addendum Note (Signed)
Addended byLaurann Montana on: 02/24/2018 01:02 PM   Modules accepted: Orders

## 2018-03-11 ENCOUNTER — Encounter (INDEPENDENT_AMBULATORY_CARE_PROVIDER_SITE_OTHER): Payer: Self-pay | Admitting: Orthopaedic Surgery

## 2018-03-11 ENCOUNTER — Ambulatory Visit (INDEPENDENT_AMBULATORY_CARE_PROVIDER_SITE_OTHER): Payer: Medicare Other | Admitting: Orthopaedic Surgery

## 2018-03-11 ENCOUNTER — Ambulatory Visit (INDEPENDENT_AMBULATORY_CARE_PROVIDER_SITE_OTHER): Payer: Medicare Other

## 2018-03-11 DIAGNOSIS — M25552 Pain in left hip: Secondary | ICD-10-CM

## 2018-03-11 DIAGNOSIS — Z96642 Presence of left artificial hip joint: Secondary | ICD-10-CM

## 2018-03-11 MED ORDER — ACETAMINOPHEN-CODEINE #3 300-30 MG PO TABS: 1.0000 | ORAL_TABLET | Freq: Three times a day (TID) | ORAL | 0 refills | Status: DC | PRN

## 2018-03-11 NOTE — Progress Notes (Signed)
The patient is very well-known to me.  She has a history of a left total hip arthroplasty 3 years ago.  After that we replaced her right hip.  Recently she is had severe left hip pain and pain in the groin with attempts of ambulating.  She is morbidly obese individual and is very sweet.  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  We have done arthroscopic surgery on her left knee before.  On examination of her left hip she is tender over the trochanteric area and left hip.  She has fluid and full range of motion of the left hip with no significant pain in the groin on exam today and compression hip does not cause pain.  X-rays are obtained of her pelvis and left hip and the cortical bone around the proximal femurs expansive and certainly different looking than her other side.  There is no acute findings and hip placement looks to be in good position.  I am unsure if this is an acute finding on her plain films and this could be worrisome for loosening.  She is not sick appearing but I am concerned about her inability to put a lot of weight on that hip.  That being said I would like to obtain an MRI of her left hip to rule out prosthetic loosening or fluid collection or anything suggestive of an infection.  I will try some Tylenol 3 for her and she will use a cane in her opposite hand until we see her back after the MRI.  All question concerns were answered and addressed.

## 2018-03-13 ENCOUNTER — Other Ambulatory Visit (INDEPENDENT_AMBULATORY_CARE_PROVIDER_SITE_OTHER): Payer: Self-pay

## 2018-03-13 DIAGNOSIS — Z96642 Presence of left artificial hip joint: Secondary | ICD-10-CM

## 2018-03-17 ENCOUNTER — Telehealth (INDEPENDENT_AMBULATORY_CARE_PROVIDER_SITE_OTHER): Payer: Self-pay | Admitting: *Deleted

## 2018-03-17 NOTE — Telephone Encounter (Signed)
-----   Message from Javier Glazier sent at 03/16/2018 10:07 AM EDT ----- Regarding: scheduling  Patient calling to ask the status of her MRI in [place] to rule out fluid around her prosthesis. She states she is in unbearable pain. She ihas a follow up appointment next Wednesday October 23rd with Dr. Ninfa Linden to go over the results .

## 2018-03-17 NOTE — Telephone Encounter (Signed)
I contacted [facility] yesterday and they had advised me to fax over order and they will contact pt to schedule appt. Pt has been made aware via vm advising this.

## 2018-03-19 ENCOUNTER — Telehealth (INDEPENDENT_AMBULATORY_CARE_PROVIDER_SITE_OTHER): Payer: Self-pay | Admitting: Orthopaedic Surgery

## 2018-03-19 NOTE — Telephone Encounter (Signed)
Patient called advised she spoke with Butch Penny at [facility] and was told the information faxed was not received. Patient said Butch Penny need the order number, demographics, copy of insurance card and authorization. Patient asked for a call back when confirmed information is received. The number to contact patient is [phone]. The number to contact Butch Penny is [phone]

## 2018-03-20 ENCOUNTER — Telehealth (INDEPENDENT_AMBULATORY_CARE_PROVIDER_SITE_OTHER): Payer: Self-pay

## 2018-03-20 NOTE — Telephone Encounter (Signed)
Signature done and faxed to number given

## 2018-03-20 NOTE — Telephone Encounter (Signed)
I have already spoken with this pt and orders has been resubmitted

## 2018-03-20 NOTE — Telephone Encounter (Signed)
Butch Penny at [facility] in Scheduling called stating that Dr. Trevor Mace signature is needed on the Order Information sheet, which is page 1 for MRI of Left Hip.  Stated to fax paper back to [phone] or [phone].  Please advise.  Thank You.

## 2018-03-20 NOTE — Telephone Encounter (Signed)
I think this is for you -

## 2018-03-25 ENCOUNTER — Ambulatory Visit (INDEPENDENT_AMBULATORY_CARE_PROVIDER_SITE_OTHER): Payer: Medicare Other | Admitting: Orthopaedic Surgery

## 2018-03-26 DIAGNOSIS — M25552 Pain in left hip: Secondary | ICD-10-CM | POA: Diagnosis not present

## 2018-03-26 DIAGNOSIS — R937 Abnormal findings on diagnostic imaging of other parts of musculoskeletal system: Secondary | ICD-10-CM | POA: Diagnosis not present

## 2018-03-26 DIAGNOSIS — Z96642 Presence of left artificial hip joint: Secondary | ICD-10-CM | POA: Diagnosis not present

## 2018-03-26 DIAGNOSIS — Z471 Aftercare following joint replacement surgery: Secondary | ICD-10-CM | POA: Diagnosis not present

## 2018-03-31 ENCOUNTER — Ambulatory Visit (INDEPENDENT_AMBULATORY_CARE_PROVIDER_SITE_OTHER): Payer: Medicare Other | Admitting: Orthopaedic Surgery

## 2018-03-31 ENCOUNTER — Encounter (INDEPENDENT_AMBULATORY_CARE_PROVIDER_SITE_OTHER): Payer: Self-pay | Admitting: Orthopaedic Surgery

## 2018-03-31 DIAGNOSIS — Z8669 Personal history of other diseases of the nervous system and sense organs: Secondary | ICD-10-CM | POA: Diagnosis not present

## 2018-03-31 DIAGNOSIS — Z96642 Presence of left artificial hip joint: Secondary | ICD-10-CM | POA: Diagnosis not present

## 2018-03-31 DIAGNOSIS — M25552 Pain in left hip: Secondary | ICD-10-CM | POA: Diagnosis not present

## 2018-03-31 DIAGNOSIS — Z87898 Personal history of other specified conditions: Secondary | ICD-10-CM

## 2018-03-31 MED ORDER — HYDROCODONE-ACETAMINOPHEN 5-325 MG PO TABS: 1.0000 | ORAL_TABLET | Freq: Three times a day (TID) | ORAL | 0 refills | Status: DC | PRN

## 2018-03-31 NOTE — Progress Notes (Signed)
The patient is coming in today to review an MRI of her left hip.  She has a history of bilateral total hip arthroplasties and she is been having some left hip pain and then was seen in the office recently and I obtained x-rays of her left and right hips.  Left hip showed a significant expansion of bone around the implant itself which was a odd appearance so I sent her for an MRI.  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  The MRI showed no fluid collection around her left hip to suggest an infectious process.  She may have some chronic loosening but certainly there is subtle blastic lesions which the radiologist felt may be consistent with her tuberosclerosis diagnosis.  On exam today she did walk in the office with minimal hip pain today and walking without a limp or assistive device.  I can easily put her left and right hips the range of motion with no significant difficulties at all.  She does have some pain over the trochanteric bursa on both sides but it is mild.  I reviewed her MRI findings with her.  This does not appear to be an infectious process but certainly there can be some chronic changes with her left hip.  Right now I would not be inclined to proceed with any type of surgical intervention until we have a better diagnosis and she may not need an intervention at all.  For now we will continue hydrocodone intermittently.  She is seeing a specialist at [facility] in December.  I would like to see her back in 3 months and I would like a repeat AP and lateral of the left hip.  Of note her MRI did show diverticulitis as it relates to the sigmoid colon.  She has had a history of that in the past and is fully aware.  She has no abdominal issues on exam today.  Again we will see her back in 3 months for repeat left hip films.  All question concerns were answered and addressed.

## 2018-04-08 DIAGNOSIS — M064 Inflammatory polyarthropathy: Secondary | ICD-10-CM | POA: Diagnosis not present

## 2018-04-08 DIAGNOSIS — R310 Gross hematuria: Secondary | ICD-10-CM | POA: Diagnosis not present

## 2018-04-08 DIAGNOSIS — R3129 Other microscopic hematuria: Secondary | ICD-10-CM | POA: Diagnosis not present

## 2018-04-08 DIAGNOSIS — E559 Vitamin D deficiency, unspecified: Secondary | ICD-10-CM | POA: Diagnosis not present

## 2018-04-08 DIAGNOSIS — Q851 Tuberous sclerosis: Secondary | ICD-10-CM | POA: Diagnosis not present

## 2018-04-08 DIAGNOSIS — M4726 Other spondylosis with radiculopathy, lumbar region: Secondary | ICD-10-CM | POA: Diagnosis not present

## 2018-04-08 DIAGNOSIS — R8290 Unspecified abnormal findings in urine: Secondary | ICD-10-CM | POA: Diagnosis not present

## 2018-04-08 DIAGNOSIS — M150 Primary generalized (osteo)arthritis: Secondary | ICD-10-CM | POA: Diagnosis not present

## 2018-04-08 DIAGNOSIS — R5383 Other fatigue: Secondary | ICD-10-CM | POA: Diagnosis not present

## 2018-04-21 DIAGNOSIS — M4726 Other spondylosis with radiculopathy, lumbar region: Secondary | ICD-10-CM | POA: Diagnosis not present

## 2018-04-21 DIAGNOSIS — M150 Primary generalized (osteo)arthritis: Secondary | ICD-10-CM | POA: Diagnosis not present

## 2018-04-21 DIAGNOSIS — E559 Vitamin D deficiency, unspecified: Secondary | ICD-10-CM | POA: Diagnosis not present

## 2018-04-21 DIAGNOSIS — Q851 Tuberous sclerosis: Secondary | ICD-10-CM | POA: Diagnosis not present

## 2018-05-05 DIAGNOSIS — M064 Inflammatory polyarthropathy: Secondary | ICD-10-CM | POA: Diagnosis not present

## 2018-05-05 DIAGNOSIS — E7800 Pure hypercholesterolemia, unspecified: Secondary | ICD-10-CM | POA: Diagnosis not present

## 2018-05-05 DIAGNOSIS — E7211 Homocystinuria: Secondary | ICD-10-CM | POA: Diagnosis not present

## 2018-05-05 DIAGNOSIS — E559 Vitamin D deficiency, unspecified: Secondary | ICD-10-CM | POA: Diagnosis not present

## 2018-05-05 DIAGNOSIS — R945 Abnormal results of liver function studies: Secondary | ICD-10-CM | POA: Diagnosis not present

## 2018-05-05 DIAGNOSIS — E782 Mixed hyperlipidemia: Secondary | ICD-10-CM | POA: Diagnosis not present

## 2018-05-05 DIAGNOSIS — R5383 Other fatigue: Secondary | ICD-10-CM | POA: Diagnosis not present

## 2018-05-12 DIAGNOSIS — R3129 Other microscopic hematuria: Secondary | ICD-10-CM | POA: Diagnosis not present

## 2018-05-12 DIAGNOSIS — R300 Dysuria: Secondary | ICD-10-CM | POA: Diagnosis not present

## 2018-05-12 DIAGNOSIS — N760 Acute vaginitis: Secondary | ICD-10-CM | POA: Diagnosis not present

## 2018-05-12 DIAGNOSIS — Z0001 Encounter for general adult medical examination with abnormal findings: Secondary | ICD-10-CM | POA: Diagnosis not present

## 2018-05-12 DIAGNOSIS — M4726 Other spondylosis with radiculopathy, lumbar region: Secondary | ICD-10-CM | POA: Diagnosis not present

## 2018-05-12 DIAGNOSIS — Q851 Tuberous sclerosis: Secondary | ICD-10-CM | POA: Diagnosis not present

## 2018-05-12 DIAGNOSIS — G8314 Monoplegia of lower limb affecting left nondominant side: Secondary | ICD-10-CM | POA: Diagnosis not present

## 2018-05-12 DIAGNOSIS — M79662 Pain in left lower leg: Secondary | ICD-10-CM | POA: Diagnosis not present

## 2018-05-19 DIAGNOSIS — M5416 Radiculopathy, lumbar region: Secondary | ICD-10-CM | POA: Diagnosis not present

## 2018-05-19 DIAGNOSIS — M48061 Spinal stenosis, lumbar region without neurogenic claudication: Secondary | ICD-10-CM | POA: Diagnosis not present

## 2018-05-19 DIAGNOSIS — M47817 Spondylosis without myelopathy or radiculopathy, lumbosacral region: Secondary | ICD-10-CM | POA: Diagnosis not present

## 2018-05-19 DIAGNOSIS — M4726 Other spondylosis with radiculopathy, lumbar region: Secondary | ICD-10-CM | POA: Diagnosis not present

## 2018-06-23 ENCOUNTER — Ambulatory Visit (INDEPENDENT_AMBULATORY_CARE_PROVIDER_SITE_OTHER): Payer: Medicare Other | Admitting: Orthopaedic Surgery

## 2018-06-30 ENCOUNTER — Ambulatory Visit (INDEPENDENT_AMBULATORY_CARE_PROVIDER_SITE_OTHER): Payer: Medicare Other | Admitting: Orthopaedic Surgery

## 2018-09-24 ENCOUNTER — Ambulatory Visit (INDEPENDENT_AMBULATORY_CARE_PROVIDER_SITE_OTHER): Payer: Medicare Other | Admitting: Orthopaedic Surgery

## 2018-09-24 ENCOUNTER — Encounter (INDEPENDENT_AMBULATORY_CARE_PROVIDER_SITE_OTHER): Payer: Self-pay | Admitting: Orthopaedic Surgery

## 2018-09-24 ENCOUNTER — Ambulatory Visit (INDEPENDENT_AMBULATORY_CARE_PROVIDER_SITE_OTHER): Payer: Medicare Other

## 2018-09-24 ENCOUNTER — Other Ambulatory Visit: Payer: Self-pay

## 2018-09-24 DIAGNOSIS — Z96642 Presence of left artificial hip joint: Secondary | ICD-10-CM | POA: Diagnosis not present

## 2018-09-24 DIAGNOSIS — Z96649 Presence of unspecified artificial hip joint: Secondary | ICD-10-CM

## 2018-09-24 DIAGNOSIS — M25552 Pain in left hip: Secondary | ICD-10-CM | POA: Diagnosis not present

## 2018-09-24 DIAGNOSIS — T84038D Mechanical loosening of other internal prosthetic joint, subsequent encounter: Secondary | ICD-10-CM | POA: Diagnosis not present

## 2018-09-24 MED ORDER — HYDROCODONE-ACETAMINOPHEN 5-325 MG PO TABS: 1.0000 | ORAL_TABLET | Freq: Four times a day (QID) | ORAL | 0 refills | Status: DC | PRN

## 2018-09-24 NOTE — Progress Notes (Signed)
Office Visit Note   Patient: [name]           Date of Birth: 1945/09/11           MRN: [id number] Visit Date: 09/24/2018              Requested by: Larence Penning, MD [address] [place], [place] [address] PCP: Larence Penning, MD   Assessment & Plan: Visit Diagnoses:  1. Pain in left hip   2. History of total left hip replacement   3. Loose total hip arthroplasty, subsequent encounter     Plan: I do feel that she needs a revision of at minimum the femoral component of her left hip.  I spent a considerable out of time in clinic today explaining the rationale behind this as well as the risk and benefits of this type of surgery.  I do feel that the femoral component is loose based on her clinical exam and radiographic findings.  We will obtain a CRP, a sed rate and CBC today in the office.  I do not feel an aspiration of the joint is warranted in this setting because we would send off interoperative cultures at the time of surgery.  I would perform this through an anterior lateral approach as opposed to a direct anterior approach given the difficulty with this type of revision.  I explained in detail what her intraoperative and postoperative course would involve.  Certainly in light of the coronavirus pandemic we were on hold for any type of surgery such as this but I would certainly want to do her surgery much more soon then other patients for elective primary total joints.  We will work on getting this scheduled.  I will inform her if there is any issues with her labs as well.  Follow-Up Instructions: Return for 2 weeks post-op.   Orders:  Orders Placed This Encounter  Procedures  . XR HIPS BILAT W OR W/O PELVIS 2V  . CBC with Differential  . Sed Rate (ESR)  . C-reactive protein   Meds ordered this encounter  Medications  . HYDROcodone-acetaminophen (NORCO/VICODIN) 5-325 MG tablet    Sig: Take 1-2 tablets by mouth every 6 (six) hours as needed for moderate pain.   Dispense:  40 tablet    Refill:  0      Procedures: No procedures performed   Clinical Data: No additional findings.   Subjective: Chief Complaint  Patient presents with  . Left Hip - Pain  The patient is someone I am seeing for many years now.  She has a history of bilateral hip replacements.  The left hip was replaced first and was done in May 2016.  About a month after surgery she was in the shower and moved her leg a certain way and she actually sustained an anterior dislocation.  I took her to the operating room and revised her just from a length standpoint putting in a longer hip ball.  We then replaced her other hip about a year or so later and her leg lengths have been equal.  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  She also has moderate obesity.  Over the last year to 2 years she has been developing slowly worsening left hip and thigh pain.  I did obtain x-rays in October of last year and that was concerning for loosening of the left hip femoral component.  An MRI was eventually obtained showing fluid around the  components and a periosteal reaction suggesting loosening and a chronic infection could not be ruled out.  However she is continued to be asymptomatic in terms of fever, chills, nausea or vomiting.  She has not been on any antibiotics.  She has been using a cane in her opposite hand to offload that hip.  She is also going to be seeing a specialist as relates to her tuberosclerosis.  She has had MRIs of her thoracic and lumbar spine as well and has had episodes of significant pain in her left thigh but also leg weakness and this affected both her legs at one point but is still more evident that she is having problems with left thigh pain and weakness.  She does get radicular component of her pain as well.  She also has had MRIs again of the thoracic and lumbar spine.  HPI  Review of Systems She currently denies any headache, chest pain, shortness of breath,  fever, chills, nausea, vomiting.  Objective: Vital Signs: There were no vitals taken for this visit.  Physical Exam She is ambulating using a cane in her right hand.  She is in no acute distress but obvious discomfort Ortho Exam Examination of her leg lengths show that are equal.  I can put both hips through full internal and external rotation.  There is some moderate pain when I manipulate the hip on the left side in terms of pain in her thigh area. Specialty Comments:  No specialty comments available.  Imaging: Xr Hips Bilat W Or W/o Pelvis 2v  Result Date: 09/24/2018 An AP pelvis and lateral of the left hip is seen today and compared to prior hip x-rays.  There is questionable loosening of the femoral prosthesis on the left side.  The right total hip arthroplasty shows no evidence of loosening or complicating features.  The right femur shows periosteal reaction and changes around the implant on the femur side suggesting loosening.  This is not changed significantly from x-rays 6 months ago.  I did independently review the MRIs of her thoracic and lumbar spine as well as her pelvis and left hip.  The concern of the left hip is fluid around the femoral component suggesting loosening.  There is a periosteal reaction as well this suggests potentially a chronic infection.  MRI of her lumbar spine does show a previous laminectomy at L4-L5 to the left.  She does have significant stenosis at the L5-S1 level more so to the left side from a broad-based disc bulge and significant facet arthritis and hypertrophy at that level.  She also has atrophy of the paraspinal muscles in her lumbar spine suggesting denervation and possibly a physiologic process which could be related to her tuberosclerosis.  PMFS History: Patient Active Problem List   Diagnosis Date Noted  . Impingement syndrome of right shoulder 02/10/2018  . Chronic right shoulder pain 08/05/2017  . Chronic left shoulder pain 08/05/2017  .  Chronic pain of right knee 08/05/2017  . Chronic pain of both shoulders 06/10/2017  . Chronic pain of left knee 06/10/2017  . Status post arthroscopy of left knee 10/24/2016  . Acute pyelonephritis 08/18/2016  . Chest pressure 08/18/2016  . Essential hypertension 08/18/2016  . Hyponatremia 08/18/2016  . Diastolic dysfunction 81/77/1165  . AKI (acute kidney injury) ([facility]) 08/18/2016  . Sepsis ([facility])   . Acute lower UTI   . Tachycardia   . Osteoarthritis of right hip 02/17/2015  . 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  Atrial tachycardia ([facility]) 12/12/2014  . 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 Recurrent dislocation of hip 10/17/2014  . Dislocation of internal left hip prosthesis ([place] [facility]) 10/16/2014  . Ectopic atrial rhythm 10/12/2014  . Atrial bigeminy 10/12/2014  . Osteoarthritis of left hip 09/16/2014  . Status post total replacement of left hip 09/16/2014  . ASCUS favoring benign 09/05/2014  . History of vitamin B deficiency 05/02/2014  . Acute bronchitis 04/18/2014  . Oral aphthous ulcer 04/18/2014  . Edema 12/21/2013  . Irregular heart beat 12/21/2013  . Anuria 11/12/2013  . History of cervical dysplasia 09/21/2013  . Rectocele, female 09/21/2013  . Kidney stone 09/21/2013  . Rash and nonspecific skin eruption 06/25/2013  . Obesity (BMI 30-39.9) 06/16/2013  . IBS (irritable bowel syndrome) 12/22/2012  . Other sleep disturbances 10/27/2012  . Elevated BP 09/29/2012  . Weight gain 08/17/2012  . Osteopenia 08/17/2012  . Vulvar lesion 08/10/2012  . 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 Condyloma acuminatum in female 07/14/2012  . Shingles 07/14/2012  . Vitamin D deficiency 07/14/2012   Past Medical History:  Diagnosis Date  . Arthritis   . DDD (degenerative disc disease), lumbosacral   . Diverticulosis   . Falls    hx of   . GERD (gastroesophageal reflux disease)   . H/O hiatal hernia   . History of diverticulitis of  colon   . History of epilepsy    childhood age 56 to 55  ---secondary to high calcium deposts of optic nerve---  none since age 55 with pregency  . History of gastric ulcer   . History of kidney stones   . History of shingles    MARCH 2014--  BACK AREA  . History of vulvar dysplasia    S/P  LASER ABLATION 2014  . IBS (irritable bowel syndrome)   . Mild obstructive sleep apnea    SLEEP STUDY  11-19-2012--  NO CPAP RX (DID NOT MEET CRITIRIA)  . Nodule of right lung    BENIGN--- AND STABLE PER  CT  11/2013  . Osteoporosis   . Renal cyst, left    STABLE PER CT 11/2013  . Seizures ([facility])   . 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 Heart disease Mother   . Tuberous sclerosis Mother   . Uterine cancer Mother   . Tuberous sclerosis Maternal Grandmother   . Tuberous sclerosis Sister   . Tuberous sclerosis Daughter   . Tuberous sclerosis Son        2 sons  . Tuberous sclerosis Maternal Aunt   . Tuberous sclerosis Maternal Aunt   . Tuberous sclerosis Maternal Aunt   . Tuberous sclerosis Cousin     Past Surgical History:  Procedure Laterality Date  . ANTERIOR HIP REVISION Left 10/18/2014   Procedure: EXCHANGE LEFT HIP BALL OF DIRECT ANTERIOR TOTAL HIP ARTHROPLASTY;  Surgeon: Mcarthur Rossetti, MD;  Location: [facility];  Service: Orthopedics;  Laterality: Left;  . AUGMENTATION MAMMAPLASTY     SALINE  . BLADDER SURGERY  1991   bladder reconstruction of torsion  . CARDIAC CATHETERIZATION  06-26-2001   NORMAL CORONARY ARTERIES  . CATARACT EXTRACTION W/ INTRAOCULAR LENS  IMPLANT, BILATERAL    . CHOLECYSTECTOMY  11-18-2003  . CO2 LASER APPLICATION N/A 01/02/1571   Procedure: CO2 LASER APPLICATION;  Surgeon: Terrance Mass,  MD;  Location: [facility];  Service: Gynecology;  Laterality: N/A;CO2 laser of vaginal and vulvar lesions  .  COLONOSCOPY    . CYSTOSCOPY WITH RETROGRADE PYELOGRAM, URETEROSCOPY AND STENT PLACEMENT Right 11/22/2013   Procedure: CYSTOSCOPY WITH RETROGRADE PYELOGRAM, URETEROSCOPY AND STENT PLACEMENT;  Surgeon: Sharyn Creamer, MD;  Location: [facility];  Service: Urology;  Laterality: Right;  . CYSTOSCOPY/RETROGRADE/URETEROSCOPY Right 12/01/2013   Procedure: CYSTOSCOPY, RIGHT RETROGRADE/RIGHT DIGITAL URETEROSCOPY, RIGHT URETERAL STENT EXCHANGE;  Surgeon: Sharyn Creamer, MD;  Location: [facility];  Service: Urology;  Laterality: Right;  STAGED RIGHT URETEROSCOPY RIGHT URETER DILATION    . DILATION AND CURETTAGE OF UTERUS  multiple -- last one 1975  . GYNECOLOGIC CRYOSURGERY    . HEMORRHOID SURGERY  1970's  . HIP CLOSED REDUCTION Left 10/16/2014   Procedure: CLOSED MANIPULATION HIP;  Surgeon: Mcarthur Rossetti, MD;  Location: [facility];  Service: Orthopedics;  Laterality: Left;  . HOLMIUM LASER APPLICATION Right 01/09/3733   Procedure: HOLMIUM LASER APPLICATION;  Surgeon: Sharyn Creamer, MD;  Location: [facility];  Service: Urology;  Laterality: Right;  . KNEE SURGERY  11/2016  . [place] SURGERY  1985  . ORIF RIGHT BIMALLEOLAR ANKLE FX  12-18-2003  . TOTAL HIP ARTHROPLASTY Left 09/16/2014   Procedure: LEFT TOTAL HIP ARTHROPLASTY ANTERIOR APPROACH;  Surgeon: Mcarthur Rossetti, MD;  Location: [facility];  Service: Orthopedics;  Laterality: Left;  . TOTAL HIP ARTHROPLASTY Right 02/17/2015   Procedure: RIGHT TOTAL HIP ARTHROPLASTY ANTERIOR APPROACH;  Surgeon: Mcarthur Rossetti, MD;  Location: [facility];  Service: Orthopedics;  Laterality: Right;  . UPPER GASTROINTESTINAL ENDOSCOPY    . VAGINAL HYSTERECTOMY  1975  . WIDE LOCAL EXCISION OF [place]TT[place] AND PERINEUM  04-06-2009   VULVAR CARCINOMA IN SITU   Social History   Occupational History  . Occupation: Education administrator: OTHER    Comment: [employer]  Tobacco Use  . Smoking status:  Former Smoker    Packs/day: 2.00    Years: 25.00    Pack years: 50.00    Types: Cigarettes    Last attempt to quit: 06/03/1998    Years since quitting: 20.3  . Smokeless tobacco: Never Used  Substance and Sexual Activity  . Alcohol use: Yes    Comment: rarely has a glass of wine  . Drug use: No  . Sexual activity: Not Currently    Partners: Male

## 2018-09-25 LAB — CBC WITH DIFFERENTIAL/PLATELET
Absolute Monocytes: 576 cells/uL (ref 200–950)
Basophils Absolute: 67 cells/uL (ref 0–200)
Basophils Relative: 1 %
Eosinophils Absolute: 228 cells/uL (ref 15–500)
Eosinophils Relative: 3.4 %
HCT: 37.3 % (ref 35.0–45.0)
Hemoglobin: 12.3 g/dL (ref 11.7–15.5)
Lymphs Abs: 1595 cells/uL (ref 850–3900)
MCH: 29.2 pg (ref 27.0–33.0)
MCHC: 33 g/dL (ref 32.0–36.0)
MCV: 88.6 fL (ref 80.0–100.0)
MPV: 12.2 fL (ref 7.5–12.5)
Monocytes Relative: 8.6 %
Neutro Abs: 4234 cells/uL (ref 1500–7800)
Neutrophils Relative %: 63.2 %
Platelets: 224 10*3/uL (ref 140–400)
RBC: 4.21 10*6/uL (ref 3.80–5.10)
RDW: 13.5 % (ref 11.0–15.0)
Total Lymphocyte: 23.8 %
WBC: 6.7 10*3/uL (ref 3.8–10.8)

## 2018-09-25 LAB — SEDIMENTATION RATE: Sed Rate: 29 mm/h (ref 0–30)

## 2018-09-25 LAB — C-REACTIVE PROTEIN: CRP: 4.3 mg/L (ref <8.0)

## 2018-10-13 ENCOUNTER — Telehealth: Payer: Self-pay | Admitting: Radiology

## 2018-10-13 NOTE — Telephone Encounter (Signed)
Patient called and states that she was speaking with someone and was cut off. She states that Dr. Ninfa Linden is going to be doing a revision surgery on her and that it is supposed to be scheduled as soon as elective surgeries are resumed. I explained that they were doing these surgeries in a three phase system, and that patients are being scheduled by certain requirements.  She would like a call back to advise when she may be able to have her surgery.  CB for patient is [phone]

## 2018-10-16 NOTE — Telephone Encounter (Signed)
Spoke with patient same day.  She will call me back to schedule.

## 2018-11-09 ENCOUNTER — Other Ambulatory Visit: Payer: Self-pay

## 2018-11-09 ENCOUNTER — Other Ambulatory Visit: Payer: Self-pay | Admitting: Physician Assistant

## 2018-11-20 ENCOUNTER — Inpatient Hospital Stay (HOSPITAL_COMMUNITY): Admission: RE | Admit: 2018-11-20 | Payer: Medicare Other | Source: Ambulatory Visit

## 2018-11-20 ENCOUNTER — Other Ambulatory Visit (HOSPITAL_COMMUNITY)
Admission: RE | Admit: 2018-11-20 | Discharge: 2018-11-20 | Disposition: A | Discharge status: Home / Self Care | Payer: Medicare Other | Source: Ambulatory Visit | Attending: Orthopaedic Surgery | Admitting: Orthopaedic Surgery

## 2018-11-20 DIAGNOSIS — Z1159 Encounter for screening for other viral diseases: Secondary | ICD-10-CM | POA: Insufficient documentation

## 2018-11-20 LAB — SARS CORONAVIRUS 2 (TAT 6-24 HRS): SARS Coronavirus 2: NEGATIVE

## 2018-11-23 ENCOUNTER — Telehealth: Payer: Self-pay

## 2018-11-23 MED ORDER — HYDROCODONE-ACETAMINOPHEN 5-325 MG PO TABS: 1.0000 | ORAL_TABLET | Freq: Four times a day (QID) | ORAL | 0 refills | Status: DC | PRN

## 2018-11-23 NOTE — Telephone Encounter (Signed)
Patient has decided to postpone her surgery that was scheduled for tomorrow until 01/12/19.  She tested negative for COVID 19 but did not quarantine this weekend.  She has been around several suspected cases recently and is concerned about the "spike" in cases.  She would like a refill of Hydrocodone to help her make it to the new surgery date.  She uses [facility] in [place].

## 2018-11-23 NOTE — Telephone Encounter (Signed)
See below

## 2018-12-10 ENCOUNTER — Telehealth: Payer: Self-pay

## 2018-12-10 NOTE — Telephone Encounter (Signed)
See below

## 2018-12-10 NOTE — Telephone Encounter (Signed)
Patient is concerned about the rise in COVID cases.  Would waiting to have surgery until winter cause any further problems with her condition?  Please call.

## 2018-12-10 NOTE — Telephone Encounter (Signed)
I am fine with her waiting for surgery as long as she is tolerating things from a pain standpoint.  Obviously if things get worse for her, we can schedule things sooner.  She just needs to let us know.  But again, I do feel that if she can wait that will be fine.

## 2018-12-11 MED ORDER — HYDROCODONE-ACETAMINOPHEN 5-325 MG PO TABS: 1.0000 | ORAL_TABLET | Freq: Four times a day (QID) | ORAL | 0 refills | Status: DC | PRN

## 2018-12-11 NOTE — Telephone Encounter (Signed)
Asking if she can have a refill of the hydrocodone

## 2018-12-22 ENCOUNTER — Other Ambulatory Visit: Payer: Self-pay

## 2018-12-22 ENCOUNTER — Encounter: Payer: Self-pay | Admitting: Orthopaedic Surgery

## 2018-12-22 ENCOUNTER — Ambulatory Visit (INDEPENDENT_AMBULATORY_CARE_PROVIDER_SITE_OTHER): Payer: Medicare Other | Admitting: Orthopaedic Surgery

## 2018-12-22 DIAGNOSIS — M25562 Pain in left knee: Secondary | ICD-10-CM

## 2018-12-22 DIAGNOSIS — Z96649 Presence of unspecified artificial hip joint: Secondary | ICD-10-CM

## 2018-12-22 DIAGNOSIS — G8929 Other chronic pain: Secondary | ICD-10-CM

## 2018-12-22 DIAGNOSIS — T84038D Mechanical loosening of other internal prosthetic joint, subsequent encounter: Secondary | ICD-10-CM | POA: Diagnosis not present

## 2018-12-22 DIAGNOSIS — M25511 Pain in right shoulder: Secondary | ICD-10-CM

## 2018-12-22 DIAGNOSIS — M25552 Pain in left hip: Secondary | ICD-10-CM

## 2018-12-22 MED ORDER — LIDOCAINE HCL 1 % IJ SOLN
3.0000 mL | INTRAMUSCULAR | Status: AC | PRN
  Administered 2018-12-22: 3 mL

## 2018-12-22 MED ORDER — METHYLPREDNISOLONE ACETATE 40 MG/ML IJ SUSP
40.0000 mg | INTRAMUSCULAR | Status: AC | PRN
  Administered 2018-12-22: 40 mg via INTRA_ARTICULAR

## 2018-12-22 NOTE — Progress Notes (Signed)
Office Visit Note   Patient: [name]           Date of Birth: 71-21-1947           MRN: [id number] Visit Date: 12/22/2018              Requested by: Larence Penning, MD [address] [place],  [place] [address] PCP: Larence Penning, MD   Assessment & Plan: Visit Diagnoses:  1. Chronic right shoulder pain   2. Loose total hip arthroplasty, subsequent encounter   3. Pain of left hip joint   4. Chronic pain of left knee     Plan: Per her request I did provide injections in the left knee joint and the right shoulder subacromial space.  She understands Newton and benefits of steroid injections.  She is not a diabetic.  She tolerated these well.  At this point will once again work on getting her scheduled for revision surgery of her left total hip due to prosthetic loosening that is aseptic.  All question concerns were answered once again.  I will give her a note to keep her out of work until further notice.  We will work on getting surgery scheduled sometime in the next month or so.  Follow-Up Instructions: Return for 2 weeks post-op.   Orders:  Orders Placed This Encounter  Procedures  . Large Joint Inj  . Large Joint Inj   No orders of the defined types were placed in this encounter.     Procedures: Large Joint Inj: L knee on 12/22/2018 9:26 AM Indications: diagnostic evaluation and pain Details: 22 G 1.5 in needle, superolateral approach  Arthrogram: No  Medications: 3 mL lidocaine 1 %; 40 mg methylPREDNISolone acetate 40 MG/ML Outcome: tolerated well, no immediate complications Procedure, treatment alternatives, risks and benefits explained, specific risks discussed. Consent was given by the patient. Immediately prior to procedure a time out was called to verify the correct patient, procedure, equipment, support staff and site/side marked as required. Patient was prepped and draped in the usual sterile fashion.   Large Joint Inj: L subacromial bursa on 12/22/2018 9:26  AM Indications: pain and diagnostic evaluation Details: 22 G 1.5 in needle  Arthrogram: No  Medications: 3 mL lidocaine 1 %; 40 mg methylPREDNISolone acetate 40 MG/ML Outcome: tolerated well, no immediate complications Procedure, treatment alternatives, risks and benefits explained, specific risks discussed. Consent was given by the patient. Immediately prior to procedure a time out was called to verify the correct patient, procedure, equipment, support staff and site/side marked as required. Patient was prepped and draped in the usual sterile fashion.       Clinical Data: No additional findings.   Subjective: Chief Complaint  Patient presents with  . Left Hip - Pain  . Right Shoulder - Pain  The patient comes in today for continued follow-up of severe left hip pain with concerning for loose prosthesis.  We had her scheduled for surgery but she canceled the surgery in light of worries about the COVID-19 virus pandemic.  She is ready to schedule surgery again.  She is still not septic appearing and denies any fever, chills, nausea, vomiting.  While she is here today she would like to have a steroid injection her left knee and her right shoulder to the pain she is having.  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  She has an upcoming pelvic MRI by the physician who follows her for that.  We have been  concerned radiographically of a loose femoral component and have recommended surgery to revise her left hip replacement.  She is back at work but due to her severe pain would like to be out of work until we get her through surgery.  She says she is ready to schedule this again.  HPI  Review of Systems She currently denies any headache, chest pain, shortness of breath, fever, chills, nausea, vomiting  Objective: Vital Signs: There were no vitals taken for this visit.  Physical Exam She is alert and orient x3 and in no acute distress Ortho Exam Examination of her right shoulder shows  it moves fluidly but has global tenderness with evidence of bursitis and tendinitis.  She does have a chronic rotator cuff tear of that shoulder on the right side.  Examination of her left knee shows global tenderness with no effusion.  The knee is ligamentously stable.  I can put her left hip through range of motion with no pain in the groin but she does have thigh pain.  This is not changed. Specialty Comments:  No specialty comments available.  Imaging: No results found.   PMFS History: Patient Active Problem List   Diagnosis Date Noted  . Impingement syndrome of right shoulder 02/10/2018  . Chronic right shoulder pain 08/05/2017  . Chronic left shoulder pain 08/05/2017  . Chronic pain of right knee 08/05/2017  . Chronic pain of both shoulders 06/10/2017  . Chronic pain of left knee 06/10/2017  . Status post arthroscopy of left knee 10/24/2016  . Acute pyelonephritis 08/18/2016  . Chest pressure 08/18/2016  . Essential hypertension 08/18/2016  . Hyponatremia 08/18/2016  . Diastolic dysfunction 15/94/5859  . AKI (acute kidney injury) ([facility]) 08/18/2016  . Sepsis ([facility])   . Acute lower UTI   . Tachycardia   . Osteoarthritis of right hip 02/17/2015  . 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 Atrial tachycardia ([facility]) 12/12/2014  . 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 Recurrent dislocation of hip 10/17/2014  . Dislocation of internal left hip prosthesis ([place] [facility]) 10/16/2014  . Ectopic atrial rhythm 10/12/2014  . Atrial bigeminy 10/12/2014  . Osteoarthritis of left hip 09/16/2014  . Status post total replacement of left hip 09/16/2014  . ASCUS favoring benign 09/05/2014  . History of vitamin B deficiency 05/02/2014  . Acute bronchitis 04/18/2014  . Oral aphthous ulcer 04/18/2014  . Edema 12/21/2013  . Irregular heart beat 12/21/2013  . Anuria 11/12/2013  . History of cervical dysplasia 09/21/2013  . Rectocele, female 09/21/2013  . Kidney  stone 09/21/2013  . Rash and nonspecific skin eruption 06/25/2013  . Obesity (BMI 30-39.9) 06/16/2013  . IBS (irritable bowel syndrome) 12/22/2012  . Other sleep disturbances 10/27/2012  . Elevated BP 09/29/2012  . Weight gain 08/17/2012  . Osteopenia 08/17/2012  . Vulvar lesion 08/10/2012  . Tuberous sclerosis ([facility]) 07/14/2012  . Condyloma acuminatum in female 07/14/2012  . Shingles 07/14/2012  . Vitamin D deficiency 07/14/2012   Past Medical History:  Diagnosis Date  . Arthritis   . DDD (degenerative disc disease), lumbosacral   . Diverticulosis   . Falls    hx of   . GERD (gastroesophageal reflux disease)   . H/O hiatal hernia   . History of diverticulitis of colon   . History of epilepsy    childhood age 5 to 31  ---secondary to high calcium deposts of optic nerve---  none since age 47 with pregency  . History of  gastric ulcer   . History of kidney stones   . History of shingles    MARCH 2014--  BACK AREA  . History of vulvar dysplasia    S/P  LASER ABLATION 2014  . IBS (irritable bowel syndrome)   . Mild obstructive sleep apnea    SLEEP STUDY  11-19-2012--  NO CPAP RX (DID NOT MEET CRITIRIA)  . Nodule of right lung    BENIGN--- AND STABLE PER  CT  11/2013  . Osteoporosis   . Renal cyst, left    STABLE PER CT 11/2013  . Seizures ([facility])   . 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 Heart disease Mother   . Tuberous sclerosis Mother   . Uterine cancer Mother   . Tuberous sclerosis Maternal Grandmother   . Tuberous sclerosis Sister   . Tuberous sclerosis Daughter   . Tuberous sclerosis Son        2 sons  . Tuberous sclerosis Maternal Aunt   . Tuberous sclerosis Maternal Aunt   . Tuberous sclerosis Maternal Aunt   . Tuberous sclerosis Cousin     Past Surgical History:  Procedure Laterality Date  . ANTERIOR HIP REVISION Left  10/18/2014   Procedure: EXCHANGE LEFT HIP BALL OF DIRECT ANTERIOR TOTAL HIP ARTHROPLASTY;  Surgeon: Mcarthur Rossetti, MD;  Location: [facility];  Service: Orthopedics;  Laterality: Left;  . AUGMENTATION MAMMAPLASTY     SALINE  . BLADDER SURGERY  1991   bladder reconstruction of torsion  . CARDIAC CATHETERIZATION  06-26-2001   NORMAL CORONARY ARTERIES  . CATARACT EXTRACTION W/ INTRAOCULAR LENS  IMPLANT, BILATERAL    . CHOLECYSTECTOMY  11-18-2003  . CO2 LASER APPLICATION N/A 06/09/4942   Procedure: CO2 LASER APPLICATION;  Surgeon: Terrance Mass, MD;  Location: [facility];  Service: Gynecology;  Laterality: N/A;CO2 laser of vaginal and vulvar lesions  . COLONOSCOPY    . CYSTOSCOPY WITH RETROGRADE PYELOGRAM, URETEROSCOPY AND STENT PLACEMENT Right 11/22/2013   Procedure: CYSTOSCOPY WITH RETROGRADE PYELOGRAM, URETEROSCOPY AND STENT PLACEMENT;  Surgeon: Sharyn Creamer, MD;  Location: [facility];  Service: Urology;  Laterality: Right;  . CYSTOSCOPY/RETROGRADE/URETEROSCOPY Right 12/01/2013   Procedure: CYSTOSCOPY, RIGHT RETROGRADE/RIGHT DIGITAL URETEROSCOPY, RIGHT URETERAL STENT EXCHANGE;  Surgeon: Sharyn Creamer, MD;  Location: [facility];  Service: Urology;  Laterality: Right;  STAGED RIGHT URETEROSCOPY RIGHT URETER DILATION    . DILATION AND CURETTAGE OF UTERUS  multiple -- last one 1975  . GYNECOLOGIC CRYOSURGERY    . HEMORRHOID SURGERY  1970's  . HIP CLOSED REDUCTION Left 10/16/2014   Procedure: CLOSED MANIPULATION HIP;  Surgeon: Mcarthur Rossetti, MD;  Location: [facility];  Service: Orthopedics;  Laterality: Left;  . HOLMIUM LASER APPLICATION Right 02/06/7590   Procedure: HOLMIUM LASER APPLICATION;  Surgeon: Sharyn Creamer, MD;  Location: [facility];  Service: Urology;  Laterality: Right;  . KNEE SURGERY  11/2016  . [place] SURGERY  1985  . ORIF RIGHT BIMALLEOLAR ANKLE FX  12-18-2003  . TOTAL HIP ARTHROPLASTY Left  09/16/2014   Procedure: LEFT TOTAL HIP ARTHROPLASTY ANTERIOR APPROACH;  Surgeon: Mcarthur Rossetti, MD;  Location: [facility];  Service: Orthopedics;  Laterality: Left;  . TOTAL HIP ARTHROPLASTY Right 02/17/2015   Procedure: RIGHT TOTAL HIP ARTHROPLASTY ANTERIOR APPROACH;  Surgeon: Mcarthur Rossetti, MD;  Location: [facility];  Service: Orthopedics;  Laterality: Right;  . UPPER GASTROINTESTINAL ENDOSCOPY    . VAGINAL HYSTERECTOMY  1975  . WIDE LOCAL EXCISION OF FOURCHETTE AND PERINEUM  04-06-2009   VULVAR CARCINOMA IN SITU   Social History   Occupational History  . Occupation: Education administrator: OTHER    Comment: [employer]  Tobacco Use  . Smoking status: Former Smoker    Packs/day: 2.00    Years: 25.00    Pack years: 50.00    Types: Cigarettes    Quit date: 06/03/1998    Years since quitting: 20.5  . Smokeless tobacco: Never Used  Substance and Sexual Activity  . Alcohol use: Yes    Comment: rarely has a glass of wine  . Drug use: No  . Sexual activity: Not Currently    Partners: Male

## 2019-01-08 ENCOUNTER — Other Ambulatory Visit (HOSPITAL_COMMUNITY): Payer: PRIVATE HEALTH INSURANCE

## 2019-01-12 ENCOUNTER — Encounter (HOSPITAL_COMMUNITY): Admission: RE | Payer: Self-pay | Source: Home / Self Care

## 2019-01-12 ENCOUNTER — Telehealth: Payer: Self-pay | Admitting: Orthopaedic Surgery

## 2019-01-12 ENCOUNTER — Inpatient Hospital Stay (HOSPITAL_COMMUNITY): Admission: RE | Admit: 2019-01-12 | Payer: Medicare Other | Source: Home / Self Care | Admitting: Orthopaedic Surgery

## 2019-01-12 SURGERY — TOTAL HIP REVISION
Anesthesia: Choice | Laterality: Left

## 2019-01-12 MED ORDER — HYDROCODONE-ACETAMINOPHEN 5-325 MG PO TABS: 1.0000 | ORAL_TABLET | Freq: Four times a day (QID) | ORAL | 0 refills | Status: DC | PRN

## 2019-01-12 NOTE — Telephone Encounter (Signed)
Please advise 

## 2019-01-12 NOTE — Telephone Encounter (Signed)
[name] called stating that she is having surgery 9/1, but is experiencing a lot of pain and wanted to know if Dr. Ninfa Linden will call her in an RX for pain medication.  [name] uses [facility] in [place], [place].  [place].  Thank you.

## 2019-01-25 ENCOUNTER — Other Ambulatory Visit: Payer: Self-pay | Admitting: Physician Assistant

## 2019-01-29 ENCOUNTER — Encounter (HOSPITAL_COMMUNITY)
Admission: RE | Admit: 2019-01-29 | Discharge: 2019-01-29 | Disposition: A | Discharge status: Home / Self Care | Payer: Medicare Other | Source: Ambulatory Visit | Attending: Orthopaedic Surgery | Admitting: Orthopaedic Surgery

## 2019-01-29 ENCOUNTER — Encounter (HOSPITAL_COMMUNITY): Payer: Self-pay

## 2019-01-29 ENCOUNTER — Other Ambulatory Visit (HOSPITAL_COMMUNITY)
Admission: RE | Admit: 2019-01-29 | Discharge: 2019-01-29 | Disposition: A | Discharge status: Home / Self Care | Payer: Medicare Other | Source: Ambulatory Visit | Attending: Orthopaedic Surgery | Admitting: Orthopaedic Surgery

## 2019-01-29 ENCOUNTER — Encounter (HOSPITAL_COMMUNITY): Payer: Self-pay | Admitting: Vascular Surgery

## 2019-01-29 ENCOUNTER — Other Ambulatory Visit: Payer: Self-pay

## 2019-01-29 DIAGNOSIS — Z9049 Acquired absence of other specified parts of digestive tract: Secondary | ICD-10-CM | POA: Diagnosis not present

## 2019-01-29 DIAGNOSIS — Z7982 Long term (current) use of aspirin: Secondary | ICD-10-CM | POA: Insufficient documentation

## 2019-01-29 DIAGNOSIS — Z20828 Contact with and (suspected) exposure to other viral communicable diseases: Secondary | ICD-10-CM | POA: Diagnosis not present

## 2019-01-29 DIAGNOSIS — Z86718 Personal history of other venous thrombosis and embolism: Secondary | ICD-10-CM | POA: Diagnosis not present

## 2019-01-29 DIAGNOSIS — I10 Essential (primary) hypertension: Secondary | ICD-10-CM | POA: Insufficient documentation

## 2019-01-29 DIAGNOSIS — Z791 Long term (current) use of non-steroidal anti-inflammatories (NSAID): Secondary | ICD-10-CM | POA: Insufficient documentation

## 2019-01-29 DIAGNOSIS — Z87891 Personal history of nicotine dependence: Secondary | ICD-10-CM | POA: Diagnosis not present

## 2019-01-29 DIAGNOSIS — T84031A Mechanical loosening of internal left hip prosthetic joint, initial encounter: Secondary | ICD-10-CM | POA: Diagnosis not present

## 2019-01-29 DIAGNOSIS — G4733 Obstructive sleep apnea (adult) (pediatric): Secondary | ICD-10-CM | POA: Insufficient documentation

## 2019-01-29 DIAGNOSIS — R569 Unspecified convulsions: Secondary | ICD-10-CM | POA: Insufficient documentation

## 2019-01-29 DIAGNOSIS — Z79899 Other long term (current) drug therapy: Secondary | ICD-10-CM | POA: Diagnosis not present

## 2019-01-29 DIAGNOSIS — Z01812 Encounter for preprocedural laboratory examination: Secondary | ICD-10-CM | POA: Insufficient documentation

## 2019-01-29 DIAGNOSIS — M810 Age-related osteoporosis without current pathological fracture: Secondary | ICD-10-CM | POA: Insufficient documentation

## 2019-01-29 DIAGNOSIS — K219 Gastro-esophageal reflux disease without esophagitis: Secondary | ICD-10-CM | POA: Insufficient documentation

## 2019-01-29 DIAGNOSIS — Z96642 Presence of left artificial hip joint: Secondary | ICD-10-CM | POA: Insufficient documentation

## 2019-01-29 DIAGNOSIS — M1990 Unspecified osteoarthritis, unspecified site: Secondary | ICD-10-CM | POA: Diagnosis not present

## 2019-01-29 HISTORY — DX: Vitamin D deficiency, unspecified: E55.9

## 2019-01-29 HISTORY — DX: Depression, unspecified: F32.A

## 2019-01-29 HISTORY — DX: Dyspnea, unspecified: R06.00

## 2019-01-29 HISTORY — DX: Essential (primary) hypertension: I10

## 2019-01-29 LAB — BASIC METABOLIC PANEL
Anion gap: 10 (ref 5–15)
BUN: 14 mg/dL (ref 8–23)
CO2: 23 mmol/L (ref 22–32)
Calcium: 9 mg/dL (ref 8.9–10.3)
Chloride: 107 mmol/L (ref 98–111)
Creatinine, Ser: 0.78 mg/dL (ref 0.44–1.00)
GFR calc Af Amer: >60 mL/min (ref >60)
GFR calc non Af Amer: >60 mL/min (ref >60)
Glucose, Bld: 104 mg/dL — ABNORMAL HIGH (ref 70–99)
Potassium: 3.9 mmol/L (ref 3.5–5.1)
Sodium: 140 mmol/L (ref 135–145)

## 2019-01-29 LAB — SURGICAL PCR SCREEN
MRSA, PCR: NEGATIVE
Staphylococcus aureus: NEGATIVE

## 2019-01-29 LAB — CBC
HCT: 41.4 % (ref 36.0–46.0)
Hemoglobin: 13.1 g/dL (ref 12.0–15.0)
MCH: 27.9 pg (ref 26.0–34.0)
MCHC: 31.6 g/dL (ref 30.0–36.0)
MCV: 88.3 fL (ref 80.0–100.0)
Platelets: 232 10*3/uL (ref 150–400)
RBC: 4.69 MIL/uL (ref 3.87–5.11)
RDW: 15 % (ref 11.5–15.5)
WBC: 5.6 10*3/uL (ref 4.0–10.5)
nRBC: 0 % (ref 0.0–0.2)

## 2019-01-29 LAB — TYPE AND SCREEN
ABO/RH(D): O NEG
Antibody Screen: NEGATIVE

## 2019-01-29 LAB — ABO/RH: ABO/RH(D): O NEG

## 2019-01-29 LAB — SARS CORONAVIRUS 2 (TAT 6-24 HRS): SARS Coronavirus 2: NEGATIVE

## 2019-01-29 NOTE — Progress Notes (Signed)
PCP -   Cardiologist -   Chest x-ray -   EKG - 07/07/2018-   Stress Test - 2016  ECHO -   Cardiac Cath - 2003  AICD- PM- LOOP-  Sleep Study -  CPAP -   LABS-  ASA-  ERAS-  HA1C-na Fasting Blood Sugar - na Checks Blood Sugar __0___ times a day  Anesthesia-  Pt denies having chest pain, sob, or fever at this time. All instructions explained to the pt, with a verbal understanding of the material. Pt agrees to go over the instructions while at home for a better understanding. Pt also instructed to self quarantine after being tested for COVID-19. The opportunity to ask questions was provided.

## 2019-01-29 NOTE — Progress Notes (Addendum)
PCP - Dr Ethelle Lyon  Cardiologist - has seen [facility] and [facility] in the past, none since 2016  Chest x-ray - na  EKG - 2/4/2020[facility]- [facility] requesed tracing  ECHO - 2016  Cardiac Cath - no  Stress test 2016  Heart Monitor 2016  Sleep Study - yes- Neg per patient  CPAP - na  LABS-CBC, BMP   ASA- 325 mg, patient stopped 01/27/2019- said no one instructed her.  I called Judeen Hammans, Dr Trevor Mace scheduler, Sherri checked with Dr Ninfa Linden and reported that he said that is ok to stop Aspirin.  ERAS-yes  HA1C-nA Fasting Blood Sugar - na Checks Blood Sugar __0___ times a day  Anesthesia-  Pt denies having chest pain, sob, or fever at this time. All instructions explained to the pt, with a verbal understanding of the material. Pt agrees to go over the instructions while at home for a better understanding. Pt also instructed to self quarantine after being tested for COVID-19. The opportunity to ask questions was provided.

## 2019-01-29 NOTE — Pre-Procedure Instructions (Signed)
[name]  01/29/2019      Your procedure is scheduled on Tuesday, September 1.  Report to [facility], Main Entrance or Entrance "A" at  12:30 PM                     Your surgery or procedure is scheduled for 2:30 PM   Call this number if you have problems the morning of surgery: [phone]  This is the number for the Pre- Surgical Desk.                For any other questions prior to surgery Monday - Friday, 8:00 AM - 4:00 PM, call [phone] PAT desk, ask to speak to any nurse.  Remember:  Do not eat  after midnight.  You may drink clear liquids until 11:30 AM .  Clear liquids allowed are:    Water, Juice (non-citric and without pulp), Carbonated beverages, Clear Tea, Black Coffee only, Plain Jell-O only, Gatorade and Plain Popsicles only  Drink Ensure Pre- Surgery between 11:00 and !!:30 am, then nothing else to drink.   Take these medicines the morning of surgery with A SIP OF WATER:             metoprolol succinate (TOPROL-XL)  pantoprazole (PROTONIX)   If needed may take:  Hydrocodone- Acetaminophen Methocarbamol (Robaxin) Follow your surgeons instructions regarding Aspirin.   STOP Do Not start  taking , Aspirin Products (Goody Powder, Excedrin Migraine), Ibuprofen (Advil), Naproxen (Aleve), Vitamins and Herbal Products (ie Fish Oil).  Special instructions:  [facility]- Preparing For Surgery  Before surgery, you can play an important role. Because skin is not sterile, your skin needs to be as free of germs as possible. You can reduce the number of germs on your skin by washing with CHG (chlorahexidine gluconate) Soap before surgery.  CHG is an antiseptic cleaner which kills germs and bonds with the skin to continue killing germs even after washing.    Oral Hygiene is also important to reduce your risk of infection.  Remember - BRUSH YOUR TEETH THE MORNING OF SURGERY WITH YOUR REGULAR TOOTHPASTE  Please do not use if you have an allergy to CHG or  antibacterial soaps. If your skin becomes reddened/irritated stop using the CHG.  Do not shave (including legs and underarms) for at least 48 hours prior to first CHG shower. It is OK to shave your face.  Please follow these instructions carefully.   1. Shower the NIGHT BEFORE SURGERY and the MORNING OF SURGERY with CHG.   2. If you chose to wash your hair, wash your hair first as usual with your normal shampoo.  3. After you shampoo,wash your face and private area with the soap you use at home, then rinse your hair and body thoroughly to remove the shampoo and soap.  .  4. Use CHG as you would any other liquid soap. You can apply CHG directly to the skin and wash gently with a scrungie or a clean washcloth.   5. Apply the CHG Soap to your body ONLY FROM THE NECK DOWN.  Do not use on open wounds or open sores. Avoid contact with your eyes, ears, mouth and genitals (private parts).   6. Wash thoroughly, paying special attention to the area where your surgery will be performed.  7. Thoroughly rinse your body with warm water from the neck down.  8. DO NOT shower/wash with your normal soap after using and rinsing  off the CHG Soap.  9. Pat yourself dry with a CLEAN TOWEL.  10. Wear CLEAN PAJAMAS to bed the night before surgery, wear comfortable clothes the morning of surgery  11. Place CLEAN SHEETS on your bed the night of your first shower and DO NOT SLEEP WITH PETS.  Day of Surgery: Shower as instructed above.Do not apply any deodorants/lotions.   Do not wear lotions, powders, or perfumes, or deodorant. Remember to brush your teeth WITH YOUR REGULAR TOOTHPASTE.  Do not wear jewelry, make-up or nail polish.  Do not shave 48 hours prior to surgery.    Do not bring valuables to the hospital.  [facility] is not responsible for any belongings or valuables.  Contacts, dentures or bridgework may not be worn into surgery.  Leave your suitcase in the car.  After surgery it may be brought to  your room.  For patients admitted to the hospital, discharge time will be determined by your treatment team.  Patients discharged the day of surgery will not be allowed to drive home.   Please read over the following fact sheets that you were given: Pain Booklet, Coughing and Deep Breathing and  Surgical Site Infections.

## 2019-02-01 ENCOUNTER — Telehealth: Payer: Self-pay

## 2019-02-01 ENCOUNTER — Encounter (HOSPITAL_COMMUNITY): Payer: Self-pay

## 2019-02-01 MED ORDER — HYDROCODONE-ACETAMINOPHEN 5-325 MG PO TABS: 1.0000 | ORAL_TABLET | Freq: Four times a day (QID) | ORAL | 0 refills | Status: DC | PRN

## 2019-02-01 NOTE — Progress Notes (Signed)
Anesthesia Chart Review:  Case: [id number] Date/Time: 02/02/19 1415   Procedure: REVISION LEFT TOTAL HIP ARTHROPLASTY (Left )   Anesthesia type: Choice   Pre-op diagnosis: Loose Left Hip Prosthesis   Location: [place] [facility] ROOM 04 / [place] [facility]   Surgeon: Mcarthur Rossetti, MD      DISCUSSION: Patient is a 73 year old female scheduled for the above procedure.  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 S/p Left THA 09/16/14 with closed reduction for dislocation 10/16/14). S/p Right THA 02/17/15. BMI is consistent with obesity.   Patient had EKG in 07/2018 during [facility] admission for gastroenteritis that showed more prominent inferior T wave inversion and new anterolateral T wave inversion. In review of 2017 and 2018 tracing in [facility], she had negative T wave in III, but otherwise more flat/non-specific T wave abnormality in other inferior leads and in anterolateral leads).  Discussed with anesthesiologist Oren Bracket, MD. Given new T wave inversions would recommend preoperative cardiology evaluation. I notified Sherrie at Dr. Trevor Mace office and also notified of RLE DVT six months ago.   Negative 01/29/19 COVID-19 test.   VS: BP (!) 123/51   Pulse 84   Temp 36.5 C   Resp 20   Ht 5\' 3"  (1.6 m)   Wt 84.7 kg   SpO2 100%   BMI 33.07 kg/m    PROVIDERS: Larence Penning, MD is PCP Boris Sharper, MD is neurologist ([facility]; last visit 12/17/18). - She is not routinely followed by  cardiology, but saw Lyman Bishop, MD in 2016 for irregular heartbeat and chest pain. Holter and stress test ordered. Stress test was negative. Cardionet showed atrial tachycardia versus PAF and was referred to EP cardiologist Allred, Jeneen Rinks, MD. She was started on diltiazem and ASA (as no significant afib burden). Echo showed normal LVEF and no significant valvular disease. Also seen at the [facility] by Roderic Palau, NP and switched to metoprolol due to side effects. Palpitations/racing heart resolved on b-blocker and discharged back to PCP at 06/19/14 visit.    LABS: Labs reviewed: Acceptable for surgery. (all labs ordered are listed, but only abnormal results are displayed)  Labs Reviewed  BASIC METABOLIC PANEL - Abnormal; Notable for the following components:      Result Value   Glucose, Bld 104 (*)    All other components within normal limits  SURGICAL PCR SCREEN  CBC  TYPE AND SCREEN     IMAGES: MRI left hip 09/21/18 [facility]): Result Impression: 1. Left total hip arthroplasty with increased periprosthetic osteolysis and periostitis in the proximal femur with cortical thickening. There is a small left hip joint effusion. The constellation of findings is most concerning for periprosthetic infection given the extent of periostitis, though hardware loosening could have a similar appearance. Healing periprosthetic fracture is considered less likely given the radiographic appearance. 2. Right total hip arthroplasty without hardware complication.   MRI T-spine 08/03/18 [facility]): Result Impression: 1. No acute fracture. 2. No cord compression or definite cord signal abnormality. 3. Similar appearance of previously  described enhancing lesion at the base of the T11 spinous process, better characterized by recent contrast enhanced study. 4. Refer to recent chest CT for discussion of extraspinal findings, including an indeterminate left renal lesion.   CTA chest/abd 07/29/18 [facility]  [facility]): Result Impression: 1. No thoracic aortic aneurysm or dissection 2. No pulmonary embolus 3. Indeterminate left posterior 1.0 cm renal lesion. Could represent atypical cyst versus solid mass. Appears to enhance on current examination. Further evaluation warranted. 4. Diverticulosis. Question sinus tract extending from the sigmoid colon towards the bladder (incompletely evaluated on CTA chest and abdomen) 5. Osseous findings of tuberous sclerosis   EKG: 07/06/18 [facility], during admission for gastroenteritis) Ventricular Rate          73    BPM          Atrial Rate            73    BPM          P-R Interval            128    ms          QRS Duration            82    ms          Q-T Interval            396    ms          QTC                436    ms          R Axis               -25    degrees        T Axis               -42    degrees        Sinus rhythm with marked sinus arrhythmia ST and T wave abnormality, consider anterolateral ischemia When compared with ECG of 17-Aug-2007 10:18, ST now depressed in anterior leads T wave inversion more evident in inferior leads T wave inversion now evident in anterolateral leads Confirmed by MILLER, DR. H. S. (22) on 07/07/2018 2:58:10 PM    CV: RLE Venous US 07/29/18 [facility]): Result Impression: Acute appearing near occlusive posterior tibial vein deep venous thrombosis  Echo 12/19/14: Impressions: - Normal biventricular size and systolic function. LVEF 65%. Wall   motion was normal. No regional wall motion abnormalities. Abnormal   relaxation, normal filling pressures. Grade 1 diastolic dysfunction.   No significant valvular abnormalities. (Trivial TR)   Normal RVSP.  Nuclear stress test 12/01/14: Study Highlights:  Nuclear stress EF: 73%.  The  left ventricular ejection fraction is hyperdynamic (>65%).  The study is normal.  This is a low risk study.  There was no ST segment deviation noted during stress.  No T wave inversion was noted during stress. Negative nuclear stress test for ischemia. Low risk. LVEF 73%.  Cardiac event monitor 10/12/14-11/10/14: Study Highlights:  Monitor shows ectopic atrial tachycardia versus PAF. - She was referred to EP cardiology   Cardiac cath 06/26/01: IMPRESSION: 1. Normal left ventricular function with normal left ventricular end-diastolic    pressure. 2. Normal coronary arteries with a left dominant circulation.   Past Medical History:  Diagnosis Date  . Arthritis   . Cancer ([place])   . DDD (degenerative disc disease), lumbosacral   . Depression  situational  . Diverticulosis   . DVT (deep venous thrombosis) ([place]) 2019   right leg  . Dyspnea    with activity  . Dysrhythmia    atrial Tachycardia  . Falls    hx of   . GERD (gastroesophageal reflux disease)   . H/O hiatal hernia   . History of diverticulitis of colon   . History of epilepsy    childhood age 89 to 68  ---secondary to high calcium deposts of optic nerve---  none since age 67 with pregency  . History of gastric ulcer   . History of kidney stones   . History of shingles    MARCH 2014--  BACK AREA  . History of vulvar dysplasia    S/P  LASER ABLATION 2014  . Hypertension   . IBS (irritable bowel syndrome)   . Mild obstructive sleep apnea    SLEEP STUDY  11-19-2012--  NO CPAP RX (DID NOT MEET CRITIRIA)  . Nodule of right lung    BENIGN--- AND STABLE PER  CT  11/2013  . Osteoporosis   . Seizures ([place])    none in years were due to tuberous sclerosis- per patient  . 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 Vitamin D deficiency     Past Surgical History:  Procedure Laterality Date  . ANTERIOR HIP REVISION Left 10/18/2014    Procedure: EXCHANGE LEFT HIP BALL OF DIRECT ANTERIOR TOTAL HIP ARTHROPLASTY;  Surgeon: Mcarthur Rossetti, MD;  Location: [place];  Service: Orthopedics;  Laterality: Left;  . AUGMENTATION MAMMAPLASTY     SALINE  . BLADDER SURGERY  1991   bladder reconstruction of torsion  . CARDIAC CATHETERIZATION  06-26-2001   NORMAL CORONARY ARTERIES  . CATARACT EXTRACTION W/ INTRAOCULAR LENS  IMPLANT, BILATERAL    . CHOLECYSTECTOMY  11-18-2003  . CO2 LASER APPLICATION N/A [id number]   Procedure: CO2 LASER APPLICATION;  Surgeon: Terrance Mass, MD;  Location: [facility];  Service: Gynecology;  Laterality: N/A;CO2 laser of vaginal and vulvar lesions  . COLONOSCOPY    . CYSTOSCOPY WITH RETROGRADE PYELOGRAM, URETEROSCOPY AND STENT PLACEMENT Right 11/22/2013   Procedure: CYSTOSCOPY WITH RETROGRADE PYELOGRAM, URETEROSCOPY AND STENT PLACEMENT;  Surgeon: Sharyn Creamer, MD;  Location[facility];  Service: Urology;  Laterality: Right;  . CYSTOSCOPY/RETROGRADE/URETEROSCOPY Right 12/01/2013   Procedure: CYSTOSCOPY, RIGHT RETROGRADE/RIGHT DIGITAL URETEROSCOPY, RIGHT URETERAL STENT EXCHANGE;  Surgeon: Sharyn Creamer, MD;  Location: [facility];  Service: Urology;  Laterality: Right;  STAGED RIGHT URETEROSCOPY RIGHT URETER DILATION    . DILATION AND CURETTAGE OF UTERUS  multiple -- last one 1975  . GYNECOLOGIC CRYOSURGERY    . HEMORRHOID SURGERY  1970's  . HIP CLOSED REDUCTION Left 10/16/2014   Procedure: CLOSED MANIPULATION HIP;  Surgeon: Mcarthur Rossetti, MD;  Location[facility];  Service: Orthopedics;  Laterality: Left;  . HOLMIUM LASER APPLICATION Right [id number]   Procedure: HOLMIUM LASER APPLICATION;  Surgeon: Sharyn Creamer, MD;  Location: [facility];  Service: Urology;  Laterality: Right;  . KNEE SURGERY Left 11/2016   "scrapped"  . [place] SURGERY  1985  . ORIF RIGHT BIMALLEOLAR ANKLE FX  12-18-2003  . TOTAL HIP ARTHROPLASTY  Left 09/16/2014   Procedure: LEFT TOTAL HIP ARTHROPLASTY ANTERIOR APPROACH;  Surgeon: Mcarthur Rossetti, MD;  [facility];  Service: Orthopedics;  Laterality: Left;  .  TOTAL HIP ARTHROPLASTY Right 02/17/2015   Procedure: RIGHT TOTAL HIP ARTHROPLASTY ANTERIOR APPROACH;  Surgeon: Mcarthur Rossetti, MD;  [facility];  Service: Orthopedics;  Laterality: Right;  . UPPER GASTROINTESTINAL ENDOSCOPY    . VAGINAL HYSTERECTOMY  1975  . WIDE LOCAL EXCISION OF FOURCHETTE AND PERINEUM  04-06-2009   VULVAR CARCINOMA IN SITU    MEDICATIONS: . acetaminophen (TYLENOL) 500 MG tablet  . aspirin EC 325 MG EC tablet  . folic acid (FOLVITE) 1 MG tablet  . furosemide (LASIX) 20 MG tablet  . HYDROcodone-acetaminophen (NORCO/VICODIN) 5-325 MG tablet  . methocarbamol (ROBAXIN) 750 MG tablet  . metoprolol succinate (TOPROL-XL) 25 MG 24 hr tablet  . naproxen sodium (ALEVE) 220 MG tablet  . pantoprazole (PROTONIX) 40 MG tablet  . potassium chloride (MICRO-K) 10 MEQ CR capsule  . valACYclovir (VALTREX) 1000 MG tablet  . Vitamin D, Ergocalciferol, (DRISDOL) 1.25 MG (50000 UT) CAPS capsule  . gabapentin (NEURONTIN) 100 MG capsule  . KLOR-CON M20 20 MEQ tablet   No current facility-administered medications for this encounter.     Myra Gianotti, PA-C Surgical Short Stay/Anesthesiology [facility] Phone [phone] [facility] Phone [phone] 02/01/2019 11:47 AM

## 2019-02-01 NOTE — Telephone Encounter (Signed)
Since surgery being postponed.  Needs more pain meds.  Send to [facility] in [place].

## 2019-02-01 NOTE — Telephone Encounter (Signed)
Please advise 

## 2019-02-02 ENCOUNTER — Encounter (HOSPITAL_COMMUNITY): Admission: RE | Payer: Self-pay | Source: Home / Self Care

## 2019-02-02 ENCOUNTER — Inpatient Hospital Stay (HOSPITAL_COMMUNITY): Admission: RE | Admit: 2019-02-02 | Payer: Medicare Other | Source: Home / Self Care | Admitting: Orthopaedic Surgery

## 2019-02-02 SURGERY — TOTAL HIP REVISION
Anesthesia: Choice | Laterality: Left

## 2019-02-03 NOTE — Progress Notes (Deleted)
Cardiology Office Note:    Date:  02/03/2019   ID:  [name], DOB 1946/01/21, MRN [id number]  PCP:  Larence Penning, MD  Cardiologist:  Shirlee More, MD   Referring MD: Larence Penning, MD  ASSESSMENT:    No diagnosis found. PLAN:    In order of problems listed above:  1. ***  Next appointment   Medication Adjustments/Labs and Tests Ordered: Current medicines are reviewed at length with the patient today.  Concerns regarding medicines are outlined above.  No orders of the defined types were placed in this encounter.  No orders of the defined types were placed in this encounter.    No chief complaint on file. ***  History of Present Illness:    [name] is a 73 y.o. female with a history of ectopic atrial tachycardia, normal coronary arteriography 2003 and normal MPI in 2016 and DVT involving the PTV of RLE  07/29/2018 who is being seen today for the evaluation of an abnormal preoiperative EKG with T wave inversions at the request of Dr Oren Bracket   Past Medical History:  Diagnosis Date  . Arthritis   . Cancer ([place])   . DDD (degenerative disc disease), lumbosacral   . Depression    situational  . Diverticulosis   . DVT (deep venous thrombosis) ([place])    right leg 07/26/18  . Dyspnea    with activity  . Dysrhythmia    atrial Tachycardia  . Falls    hx of   . GERD (gastroesophageal reflux disease)   . H/O hiatal hernia   . History of diverticulitis of colon   . History of epilepsy    childhood age 66 to 11  ---secondary to high calcium deposts of optic nerve---  none since age 62 with pregency  . History of gastric ulcer   . History of kidney stones   . History of shingles    MARCH 2014--  BACK AREA  . History of vulvar dysplasia    S/P  LASER ABLATION 2014  . Hypertension   . IBS (irritable bowel syndrome)   . Mild obstructive sleep apnea    SLEEP STUDY  11-19-2012--  NO CPAP RX (DID NOT MEET CRITIRIA)  . Nodule of right lung    BENIGN--- AND STABLE PER  CT  11/2013  . Osteoporosis   . Seizures ([place])    none in years were due to tuberous sclerosis- per patient  . 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 Vitamin D deficiency     Past Surgical History:  Procedure Laterality Date  . ANTERIOR HIP REVISION Left 10/18/2014   Procedure: EXCHANGE LEFT HIP BALL OF DIRECT ANTERIOR TOTAL HIP ARTHROPLASTY;  Surgeon: Mcarthur Rossetti, MD;  Location: [place];  Service: Orthopedics;  Laterality: Left;  . AUGMENTATION MAMMAPLASTY     SALINE  . BLADDER SURGERY  1991   bladder reconstruction of torsion  . CARDIAC CATHETERIZATION  06-26-2001   NORMAL CORONARY ARTERIES  . CATARACT EXTRACTION W/ INTRAOCULAR LENS  IMPLANT, BILATERAL    . CHOLECYSTECTOMY  11-18-2003  . CO2 LASER APPLICATION N/A [id number]   Procedure: CO2 LASER APPLICATION;  Surgeon: Terrance Mass, MD;  [facility];  Service: Gynecology;  Laterality: N/A;CO2 laser of vaginal and vulvar lesions  . COLONOSCOPY    . CYSTOSCOPY WITH RETROGRADE PYELOGRAM, URETEROSCOPY AND STENT PLACEMENT Right 11/22/2013  Procedure: CYSTOSCOPY WITH RETROGRADE PYELOGRAM, URETEROSCOPY AND STENT PLACEMENT;  Surgeon: Sharyn Creamer, MD;  Location: [facility];  Service: Urology;  Laterality: Right;  . CYSTOSCOPY/RETROGRADE/URETEROSCOPY Right 12/01/2013   Procedure: CYSTOSCOPY, RIGHT RETROGRADE/RIGHT DIGITAL URETEROSCOPY, RIGHT URETERAL STENT EXCHANGE;  Surgeon: Sharyn Creamer, MD;  Location: [facility];  Service: Urology;  Laterality: Right;  STAGED RIGHT URETEROSCOPY RIGHT URETER DILATION    . DILATION AND CURETTAGE OF UTERUS  multiple -- last one 1975  . GYNECOLOGIC CRYOSURGERY    . HEMORRHOID SURGERY  1970's  . HIP CLOSED REDUCTION Left 10/16/2014   Procedure: CLOSED MANIPULATION HIP;  Surgeon: Mcarthur Rossetti, MD;  [facility];  Service: Orthopedics;  Laterality: Left;  . HOLMIUM LASER APPLICATION Right [id number]   Procedure: HOLMIUM LASER APPLICATION;  Surgeon: Sharyn Creamer, MD;  Location: [facility];  Service: Urology;  Laterality: Right;  . KNEE SURGERY Left 11/2016   "scrapped"  . [place] SURGERY  1985  . ORIF RIGHT BIMALLEOLAR ANKLE FX  12-18-2003  . TOTAL HIP ARTHROPLASTY Left 09/16/2014   Procedure: LEFT TOTAL HIP ARTHROPLASTY ANTERIOR APPROACH;  Surgeon: Mcarthur Rossetti, MD;  [facility];  Service: Orthopedics;  Laterality: Left;  . TOTAL HIP ARTHROPLASTY Right 02/17/2015   Procedure: RIGHT TOTAL HIP ARTHROPLASTY ANTERIOR APPROACH;  Surgeon: Mcarthur Rossetti, MD;  [facility];  Service: Orthopedics;  Laterality: Right;  . UPPER GASTROINTESTINAL ENDOSCOPY    . VAGINAL HYSTERECTOMY  1975  . WIDE LOCAL EXCISION OF FOURCHETTE AND PERINEUM  04-06-2009   VULVAR CARCINOMA IN SITU    Current Medications: No outpatient medications have been marked as taking for the 02/04/19 encounter (Appointment) with Richardo Priest, MD.     Allergies:   Other, Sulfa antibiotics, Zoloft [sertraline hcl], Oxycodone, and Contrast media [iodinated diagnostic agents]   Social History   Socioeconomic History  . Marital status: Widowed    Spouse name: Not on file  . Number of children: 2  . Years of education: 72  . Highest education level: Not on file  Occupational History  . 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 Financial resource strain: Not on file  . Food insecurity    Worry: Not on file    Inability: Not on file  . Transportation needs    Medical: Not on file    Non-medical: Not on file  Tobacco Use  . Smoking status: Former Smoker    Packs/day: 2.00    Years: 25.00    Pack years: 50.00    Types: Cigarettes    Quit date: 06/03/1998    Years since quitting: 20.6  . Smokeless tobacco: Never Used  Substance and  Sexual Activity  . Alcohol use: Yes    Comment: rarely has a glass of wine  . Drug use: No  . Sexual activity: Not Currently    Partners: Male  Lifestyle  . Physical activity    Days per week: Not on file    Minutes per session: Not on file  . Stress: Not on file  Relationships  . Social Herbalist on phone: Not on file    Gets together: Not on file    Attends religious service: Not on file    Active member of club or organization: Not on file    Attends meetings of clubs or organizations: Not on file  Relationship status: Not on file  Other Topics Concern  . Not on file  Social History Narrative   Exercise-no   Lives alone    Drinks decaf coffee 1 cup a day      Family History: The patient's ***family history includes Heart disease in her mother; Tuberous sclerosis in her cousin, daughter, maternal aunt, maternal aunt, maternal aunt, maternal grandmother, mother, sister, and son; Uterine cancer in her mother.  ROS:   ROS Please see the history of present illness.    *** All other systems reviewed and are negative.  EKGs/Labs/Other Studies Reviewed:    The following studies were reviewed today: *** CTA chest 07/29/2018: 1. No thoracic aortic aneurysm or dissection 2. No pulmonary embolus 3. Indeterminate left posterior 1.0 cm renal lesion. Could represent atypical cyst versus solid mass. Appears to enhance on current examination. Further evaluation warranted. 4. Diverticulosis. Question sinus tract extending from the sigmoid colon towards the bladder (incompletely evaluated on CTA chest and abdomen) 5. Osseous findings of tuberous sclerosis  RLE venous duplex 07/29/2018: FINDINGS: . Common femoral vein: Patent. . Femoral vein: Patent. . Greater saphenous vein: Patent. . Popliteal vein: Patent. . Anterior tibial vein: Patent. . Posterior tibial vein: Acute appearing near occlusive hypoechoic deep venous thrombosis . Peroneal vein: Patent. .  Venous compressibility: Normal. . Respiratory variation: Normal. . Waveform response to augmentation: Normal. . Additional comments: None.  EKG:  EKG is *** ordered today.  The ekg ordered today is personally reviewed and demonstrates ***  Recent Labs: 01/29/2019: BUN 14; Creatinine, Ser 0.78; Hemoglobin 13.1; Platelets 232; Potassium 3.9; Sodium 140  Recent Lipid Panel No results found for: CHOL, TRIG, HDL, CHOLHDL, VLDL, LDLCALC, LDLDIRECT  Physical Exam:    VS:  There were no vitals taken for this visit.    Wt Readings from Last 3 Encounters:  01/29/19 186 lb 11.2 oz (84.7 kg)  08/20/16 202 lb 11.2 oz (91.9 kg)  05/02/16 202 lb (91.6 kg)     GEN: *** Well nourished, well developed in no acute distress HEENT: Normal NECK: No JVD; No carotid bruits LYMPHATICS: No lymphadenopathy CARDIAC: ***RRR, no murmurs, rubs, gallops RESPIRATORY:  Clear to auscultation without rales, wheezing or rhonchi  ABDOMEN: Soft, non-tender, non-distended MUSCULOSKELETAL:  No edema; No deformity  SKIN: Warm and dry NEUROLOGIC:  Alert and oriented x 3 PSYCHIATRIC:  Normal affect     Signed, Shirlee More, MD  02/03/2019 1:39 PM    [facility]

## 2019-02-04 ENCOUNTER — Ambulatory Visit: Payer: Medicare Other | Admitting: Cardiology

## 2019-02-04 ENCOUNTER — Encounter

## 2019-02-04 NOTE — Progress Notes (Signed)
Cardiology Office Note:    Date:  02/05/2019   ID:  [name], DOB 1946-04-10, MRN [id number]  PCP:  Samantha Penning, MD  Cardiologist:  Samantha More, MD   Referring MD: Samantha Penning, MD  ASSESSMENT:    1. Atrial tachycardia ([place])   2. History of DVT (deep vein thrombosis)   3. Essential hypertension   4. Nonspecific abnormal electrocardiogram (ECG) (EKG)    PLAN:    In order of problems listed above:  1. Stable no recurrence decrease beta-blocker by 50% 2. She has high risk for postoperative DVT 3. Stable at target continue current treatment diuretic low-dose beta-blocker 4. With fatigue abnormal EKG bradycardia further evaluation with event monitor next week after decreasing beta-blocker 50% echocardiogram exclude cardiomyopathy and check thyroid studies  Next appointment 3 weeks   Medication Adjustments/Labs and Tests Ordered: Current medicines are reviewed at length with the patient today.  Concerns regarding medicines are outlined above.  No orders of the defined types were placed in this encounter.  No orders of the defined types were placed in this encounter.    Chief Complaint  Patient presents with  . Pre-op Exam    with PAT and an   . Abnormal ECG    History of Present Illness:    [name] is a 73 y.o. female with a history of ectopic atrial tachycardia, normal coronary arteriography 2003 and normal MPI in 2016 and DVT involving the PTV of RLE  07/29/2018 who is being seen today for the evaluation of an abnormal preoiperative EKG with T wave inversions at the request of Dr Oren Bracket. Last seen by [facility] 06/20/2015 for PAT.  I am unable to access records but the chart says she had normal coronary arteriography in 2003.  She has struggled with hip pain and is awaiting a revision with notices fatigue that she thinks is out of proportional to her limitations due to pain and not related to her analgesic.  She can walk the length of the  hallway she cannot climb stairs and has to stop and rest when she does light housework exercise tolerance is less than 4 metabolic equivalents.  She has had no palpitation or syncope chest pain or shortness of breath.  She questions whether she has underlying heart disease.  Today in my office her heart rate is 48 bpm we will reduce the dose of her beta-blocker by 50% in next week to a 3-day event monitor to screen her bradycardia.  Her EKG is abnormal although nonspecific T waves and showed an echocardiogram to assess cardiac performance ejection fraction and valvular function with her fatigue.  I suspect the events are noncardiac and she will be seen in 3 weeks I think she will be cleared for surgery and does not require repeat ischemia evaluation.  She has a background history of DVT and will be high risk in the postoperative..  If admitted to observation after surgery please put in a monitored bed and if need be contact heart care to see her she should have an EKG postoperative day 1.  Her hypertension is stable continue current treatment she takes a loop diuretic.  Her preoperative labs include a normal CBC renal function potassium 3.9.  She has not had thyroid studies since 2017 and with her fatigue and bradycardia we will assess TSH T3 and T4.  I will have her see my nurse practitioner in follow-up in 3 weeks Past Medical History:  Diagnosis Date  .  Arthritis   . Cancer ([place])   . DDD (degenerative disc disease), lumbosacral   . Depression    situational  . Diverticulosis   . DVT (deep venous thrombosis) ([place])    right leg 07/26/18  . Dyspnea    with activity  . Dysrhythmia    atrial Tachycardia  . Falls    hx of   . GERD (gastroesophageal reflux disease)   . H/O hiatal hernia   . History of diverticulitis of colon   . History of epilepsy    childhood age 43 to 89  ---secondary to high calcium deposts of optic nerve---  none since age 31 with pregency  . History of gastric ulcer   .  History of kidney stones   . History of shingles    MARCH 2014--  BACK AREA  . History of vulvar dysplasia    S/P  LASER ABLATION 2014  . Hypertension   . IBS (irritable bowel syndrome)   . Mild obstructive sleep apnea    SLEEP STUDY  11-19-2012--  NO CPAP RX (DID NOT MEET CRITIRIA)  . Nodule of right lung    BENIGN--- AND STABLE PER  CT  11/2013  . Osteoporosis   . Seizures ([place])    none in years were due to tuberous sclerosis- per patient  . 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 Vitamin D deficiency     Past Surgical History:  Procedure Laterality Date  . ANTERIOR HIP REVISION Left 10/18/2014   Procedure: EXCHANGE LEFT HIP BALL OF DIRECT ANTERIOR TOTAL HIP ARTHROPLASTY;  Surgeon: Mcarthur Rossetti, MD;  Location: [place];  Service: Orthopedics;  Laterality: Left;  . AUGMENTATION MAMMAPLASTY     SALINE  . BLADDER SURGERY  1991   bladder reconstruction of torsion  . CARDIAC CATHETERIZATION  06-26-2001   NORMAL CORONARY ARTERIES  . CATARACT EXTRACTION W/ INTRAOCULAR LENS  IMPLANT, BILATERAL    . CHOLECYSTECTOMY  11-18-2003  . CO2 LASER APPLICATION N/A [id number]   Procedure: CO2 LASER APPLICATION;  Surgeon: Terrance Mass, MD;  Location: [facility];  Service: Gynecology;  Laterality: N/A;CO2 laser of vaginal and vulvar lesions  . COLONOSCOPY    . CYSTOSCOPY WITH RETROGRADE PYELOGRAM, URETEROSCOPY AND STENT PLACEMENT Right 11/22/2013   Procedure: CYSTOSCOPY WITH RETROGRADE PYELOGRAM, URETEROSCOPY AND STENT PLACEMENT;  Surgeon: Sharyn Creamer, MD;  Location: [facility];  Service: Urology;  Laterality: Right;  . CYSTOSCOPY/RETROGRADE/URETEROSCOPY Right 12/01/2013   Procedure: CYSTOSCOPY, RIGHT RETROGRADE/RIGHT DIGITAL URETEROSCOPY, RIGHT URETERAL STENT EXCHANGE;  Surgeon: Sharyn Creamer, MD;  Location: [facility];  Service: Urology;   Laterality: Right;  STAGED RIGHT URETEROSCOPY RIGHT URETER DILATION    . DILATION AND CURETTAGE OF UTERUS  multiple -- last one 1975  . GYNECOLOGIC CRYOSURGERY    . HEMORRHOID SURGERY  1970's  . HIP CLOSED REDUCTION Left 10/16/2014   Procedure: CLOSED MANIPULATION HIP;  Surgeon: Mcarthur Rossetti, MD;  Location: [facility];  Service: Orthopedics;  Laterality: Left;  . HOLMIUM LASER APPLICATION Right [id number]   Procedure: HOLMIUM LASER APPLICATION;  Surgeon: Sharyn Creamer, MD;  Location: [facility];  Service: Urology;  Laterality: Right;  . KNEE SURGERY Left 11/2016   "scrapped"  . [place] SURGERY  1985  . ORIF RIGHT BIMALLEOLAR ANKLE FX  12-18-2003  . TOTAL HIP ARTHROPLASTY Left 09/16/2014   Procedure: LEFT TOTAL  HIP ARTHROPLASTY ANTERIOR APPROACH;  Surgeon: Mcarthur Rossetti, MD;  Location: [facility];  Service: Orthopedics;  Laterality: Left;  . TOTAL HIP ARTHROPLASTY Right 02/17/2015   Procedure: RIGHT TOTAL HIP ARTHROPLASTY ANTERIOR APPROACH;  Surgeon: Mcarthur Rossetti, MD;  Location: [facility];  Service: Orthopedics;  Laterality: Right;  . UPPER GASTROINTESTINAL ENDOSCOPY    . VAGINAL HYSTERECTOMY  1975  . WIDE LOCAL EXCISION OF FOURCHETTE AND PERINEUM  04-06-2009   VULVAR CARCINOMA IN SITU    Current Medications: Current Meds  Medication Sig  . acetaminophen (TYLENOL) 500 MG tablet Take 1,000 mg by mouth every 6 (six) hours as needed for mild pain or headache.   . folic acid (FOLVITE) 1 MG tablet Take 1 mg by mouth daily.  . furosemide (LASIX) 20 MG tablet TAKE 1 TABLET BY MOUTH DAILY  . HYDROcodone-acetaminophen (NORCO/VICODIN) 5-325 MG tablet Take 1 tablet by mouth every 6 (six) hours as needed for moderate pain.  Marland Kitchen KLOR-CON M20 20 MEQ tablet Take 1 tablet (20 mEq total) by mouth daily.  . methocarbamol (ROBAXIN) 750 MG tablet Take 750 mg by mouth 2 (two) times daily as needed for muscle spasms.  . metoprolol succinate (TOPROL-XL) 25 MG 24 hr tablet  Take 12.5 mg by mouth 2 (two) times daily.  . naproxen sodium (ALEVE) 220 MG tablet Take 220 mg by mouth as needed.  . pantoprazole (PROTONIX) 40 MG tablet Take 40 mg by mouth 2 (two) times a day.  . valACYclovir (VALTREX) 1000 MG tablet Take 1,000 mg by mouth as needed. Take 500mg  twice a day for shingles outbreaks as needed  . Vitamin D, Ergocalciferol, (DRISDOL) 1.25 MG (50000 UT) CAPS capsule Take 50,000 Units by mouth every Sunday.     Allergies:   Other, Sulfa antibiotics, Zoloft [sertraline hcl], Oxycodone, and Contrast media [iodinated diagnostic agents]   Social History   Socioeconomic History  . Marital status: Widowed    Spouse name: Not on file  . Number of children: 2  . Years of education: 7  . Highest education level: Not on file  Occupational History  . 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 Financial resource strain: Not on file  . Food insecurity    Worry: Not on file    Inability: Not on file  . Transportation needs    Medical: Not on file    Non-medical: Not on file  Tobacco Use  . Smoking status: Former Smoker    Packs/day: 2.00    Years: 25.00    Pack years: 50.00    Types: Cigarettes    Quit date: 06/03/1998    Years since quitting: 20.6  . Smokeless tobacco: Never Used  Substance and Sexual Activity  . Alcohol use: Yes    Comment: rarely has a glass of wine  . Drug use: No  . Sexual activity: Not Currently    Partners: Male  Lifestyle  . Physical activity    Days per week: Not on file    Minutes per session: Not on file  . Stress: Not on file  Relationships  . Social Herbalist on phone: Not on file    Gets together: Not on file    Attends religious service: Not on file    Active member of club or organization: Not on file    Attends meetings of clubs or organizations: Not on file    Relationship status: Not on  file  Other Topics Concern  . Not on file  Social History Narrative    Exercise-no   Lives alone    Drinks decaf coffee 1 cup a day      Family History: The patient's family history includes Heart disease in her mother; Tuberous sclerosis in her cousin, daughter, maternal aunt, maternal aunt, maternal aunt, maternal grandmother, mother, sister, and son; Uterine cancer in her mother.  ROS:   Review of Systems  Constitution: Positive for malaise/fatigue.  HENT: Negative.   Eyes: Negative.   Cardiovascular: Negative.   Respiratory: Negative.   Endocrine: Negative.   Hematologic/Lymphatic: Negative.   Skin: Negative.   Musculoskeletal: Positive for joint pain.  Gastrointestinal: Negative.   Genitourinary: Negative.   Neurological: Negative.   Psychiatric/Behavioral: Negative.   Allergic/Immunologic: Negative.    Please see the history of present illness.     All other systems reviewed and are negative.  EKGs/Labs/Other Studies Reviewed:    The following studies were reviewed today:   EKG:  EKG is  ordered today.  The ekg ordered today is personally reviewed and demonstrates sinus bradycardia and nonspecific T waves  CTA chest 07/29/2018: 1. No thoracic aortic aneurysm or dissection 2. No pulmonary embolus 3. Indeterminate left posterior 1.0 cm renal lesion. Could represent atypical cyst versus solid mass. Appears to enhance on current examination. Further evaluation warranted. 4. Diverticulosis. Question sinus tract extending from the sigmoid colon towards the bladder (incompletely evaluated on CTA chest and abdomen) 5. Osseous findings of tuberous sclerosis  RLE venous duplex 07/29/2018: FINDINGS: . Common femoral vein: Patent. . Femoral vein: Patent. . Greater saphenous vein: Patent. . Popliteal vein: Patent. . Anterior tibial vein: Patent. . Posterior tibial vein: Acute appearing near occlusive hypoechoic deep venous thrombosis . Peroneal vein: Patent. . Venous compressibility: Normal. . Respiratory variation: Normal. .  Waveform response to augmentation: Normal. . Additional comments: None. Recent Labs: 01/29/2019: BUN 14; Creatinine, Ser 0.78; Hemoglobin 13.1; Platelets 232; Potassium 3.9; Sodium 140  Recent Lipid Panel No results found for: CHOL, TRIG, HDL, CHOLHDL, VLDL, LDLCALC, LDLDIRECT  Physical Exam:    VS:  BP 132/74 (BP Location: Left Arm, Patient Position: Sitting, Cuff Size: Large)   Pulse (!) 48   Ht 5\' 3"  (1.6 m)   Wt 192 lb 12.8 oz (87.5 kg)   SpO2 98%   BMI 34.15 kg/m     Wt Readings from Last 3 Encounters:  02/05/19 192 lb 12.8 oz (87.5 kg)  01/29/19 186 lb 11.2 oz (84.7 kg)  08/20/16 202 lb 11.2 oz (91.9 kg)     GEN: Has a somewhat sallow appearance check thyroid well nourished, well developed in no acute distress HEENT: Normal NECK: No JVD; No carotid bruits LYMPHATICS: No lymphadenopathy CARDIAC: RRR, no murmurs, rubs, gallops RESPIRATORY:  Clear to auscultation without rales, wheezing or rhonchi  ABDOMEN: Soft, non-tender, non-distended MUSCULOSKELETAL:  No edema; No deformity  SKIN: Warm and dry NEUROLOGIC:  Alert and oriented x 3 PSYCHIATRIC:  Normal affect     Signed, Samantha More, MD  02/05/2019 8:38 AM    [place]

## 2019-02-05 ENCOUNTER — Ambulatory Visit (INDEPENDENT_AMBULATORY_CARE_PROVIDER_SITE_OTHER): Payer: Medicare Other | Admitting: Cardiology

## 2019-02-05 ENCOUNTER — Other Ambulatory Visit: Payer: Self-pay

## 2019-02-05 VITALS — BP 132/74 | HR 48 | Ht 63.0 in | Wt 192.8 lb

## 2019-02-05 DIAGNOSIS — I471 Supraventricular tachycardia: Secondary | ICD-10-CM | POA: Diagnosis not present

## 2019-02-05 DIAGNOSIS — Z86718 Personal history of other venous thrombosis and embolism: Secondary | ICD-10-CM | POA: Diagnosis not present

## 2019-02-05 DIAGNOSIS — I10 Essential (primary) hypertension: Secondary | ICD-10-CM | POA: Diagnosis not present

## 2019-02-05 DIAGNOSIS — R9431 Abnormal electrocardiogram [ECG] [EKG]: Secondary | ICD-10-CM | POA: Diagnosis not present

## 2019-02-05 NOTE — Patient Instructions (Addendum)
Medication Instructions:  Your physician has recommended you make the following change in your medication:   Decrease Toprol to 12.5 mg DAILY  If you need a refill on your cardiac medications before your next appointment, please call your pharmacy.   Lab work: Your physician recommends that you return for lab work in: TODAY TSH,T3,T4  If you have labs (blood work) drawn today and your tests are completely normal, you will receive your results only by: Marland Kitchen MyChart Message (if you have MyChart) OR . A paper copy in the mail If you have any lab test that is abnormal or we need to change your treatment, we will call you to review the results.  Testing/Procedures: Your physician has recommended that you wear a ZIO monitor. ZIO monitors are medical devices that record the heart's electrical activity. Doctors most often use these monitors to diagnose arrhythmias. Arrhythmias are problems with the speed or rhythm of the heartbeat. The monitor is a small, portable device. You can wear one while you do your normal daily activities. This is usually used to diagnose what is causing palpitations/syncope (passing out).  Wear 3 days  Your physician has requested that you have an echocardiogram. Echocardiography is a painless test that uses sound waves to create images of your heart. It provides your doctor with information about the size and shape of your heart and how well your heart's chambers and valves are working. This procedure takes approximately one hour. There are no restrictions for this procedure.    Follow-Up: At [facility], you and your health needs are our priority.  As part of our continuing mission to provide you with exceptional heart care, we have created designated Provider Care Teams.  These Care Teams include your primary Cardiologist (physician) and Advanced Practice Providers (APPs -  Physician Assistants and Nurse Practitioners) who all work together to provide you with the care  you need, when you need it. . You will need a follow up appointment in 3 weeks with Laurann Montana NP  Jyl Heinz, MD  Any Other Special Instructions Will Be Listed Below (If Applicable).

## 2019-02-06 LAB — T3: T3, Total: 122 ng/dL (ref 71–180)

## 2019-02-06 LAB — T4: T4, Total: 7.6 ug/dL (ref 4.5–12.0)

## 2019-02-06 LAB — TSH: TSH: 1.85 u[IU]/mL (ref 0.450–4.500)

## 2019-02-09 ENCOUNTER — Ambulatory Visit (HOSPITAL_BASED_OUTPATIENT_CLINIC_OR_DEPARTMENT_OTHER): Admission: RE | Admit: 2019-02-09 | Payer: Medicare Other | Source: Ambulatory Visit

## 2019-02-11 ENCOUNTER — Ambulatory Visit (HOSPITAL_BASED_OUTPATIENT_CLINIC_OR_DEPARTMENT_OTHER): Admission: RE | Admit: 2019-02-11 | Payer: Medicare Other | Source: Ambulatory Visit

## 2019-02-15 ENCOUNTER — Ambulatory Visit (HOSPITAL_BASED_OUTPATIENT_CLINIC_OR_DEPARTMENT_OTHER)
Admission: RE | Admit: 2019-02-15 | Discharge: 2019-02-15 | Disposition: A | Discharge status: Home / Self Care | Payer: Medicare Other | Source: Ambulatory Visit | Attending: Cardiology | Admitting: Cardiology

## 2019-02-15 ENCOUNTER — Other Ambulatory Visit: Payer: Self-pay

## 2019-02-15 DIAGNOSIS — I10 Essential (primary) hypertension: Secondary | ICD-10-CM | POA: Insufficient documentation

## 2019-02-15 NOTE — Progress Notes (Signed)
  Echocardiogram 2D Echocardiogram has been performed.  Bobbye Charleston 02/15/2019, 2:16 PM

## 2019-02-17 ENCOUNTER — Telehealth: Payer: Self-pay

## 2019-02-17 NOTE — Telephone Encounter (Signed)
Information relayed to patient, copy sent to Dr. Lorenda Cahill and Dr. Ninfa Linden per Dr. Docia Furl request.

## 2019-02-17 NOTE — Telephone Encounter (Signed)
-----   Message from Jenean Lindau, MD sent at 02/16/2019 12:17 PM EDT ----- The results of the study is unremarkable. Please inform patient. I will discuss in detail at next appointment. Cc  primary care/referring physician Jenean Lindau, MD 02/16/2019 12:17 PM

## 2019-02-22 ENCOUNTER — Telehealth: Payer: Self-pay | Admitting: Orthopaedic Surgery

## 2019-02-22 ENCOUNTER — Other Ambulatory Visit: Payer: Self-pay | Admitting: Orthopaedic Surgery

## 2019-02-22 MED ORDER — HYDROCODONE-ACETAMINOPHEN 5-325 MG PO TABS: 1.0000 | ORAL_TABLET | Freq: Four times a day (QID) | ORAL | 0 refills | Status: DC | PRN

## 2019-02-22 NOTE — Telephone Encounter (Signed)
Patient called. She would like some pain meds called in. Her call back number [phone]

## 2019-02-22 NOTE — Telephone Encounter (Signed)
Please advise 

## 2019-02-23 ENCOUNTER — Ambulatory Visit: Payer: Medicare Other | Admitting: Cardiology

## 2019-02-26 ENCOUNTER — Telehealth: Payer: Self-pay | Admitting: Family

## 2019-02-26 ENCOUNTER — Ambulatory Visit: Payer: Medicare Other | Admitting: Family

## 2019-02-26 NOTE — Telephone Encounter (Signed)
Spoke with Mrs. Fripp. She cancelled her appt today due to the weather and wonders whether she needs a follow up prior to surgery. At office visit her beta blocker was reduced for HR 48. She has a known hx of PAT, a ZIO monitor was ordered.   Monitor was worn - Jerl Santos looked online and the monitor report says "no data" which per the company means it was not activated. She has no mechanism to check her BP nor HR at home.  Tells me her fatigue has improved since reducing her beta blocker at last office visit.   Would we like her to come for an appt and evaluate response via EKG or office visit to place ZIO? We can chat Monday. Told her I would call her back Monday.   Thanks,  Loel Dubonnet, NP

## 2019-02-26 NOTE — Telephone Encounter (Signed)
Patient called this am and rescheduled her appt due to it raining and her hip is hurting. She wanted virtual visit but I explained we weren't scheduling those.  She would like a call from you to see if even she needs to come.

## 2019-02-27 NOTE — Telephone Encounter (Signed)
Lets have her see me in the office this week I can do an EKG at that time and likely will cancel the ZIO monitor.

## 2019-03-02 NOTE — Progress Notes (Signed)
Office Visit    Patient Name: [name] Date of Encounter: 03/03/2019  Primary Care Provider:  Larence Penning, MD Primary Cardiologist:  Shirlee More, MD Electrophysiologist:  None   Chief Complaint    [name] is a 73 y.o. female with a hx of ectopic atrial tachycardia, normal coronary arteriography 2003 and normal MPI in 2016, DVT involving PTV of RLE 07/29/18. She presents today for follow up after medication changes and echocardiogram for preoperative clearance.  Past Medical History    Past Medical History:  Diagnosis Date   Arthritis    Cancer ([place])    DDD (degenerative disc disease), lumbosacral    Depression    situational   Diverticulosis    DVT (deep venous thrombosis) ([place])    right leg 07/26/18   Dyspnea    with activity   Dysrhythmia    atrial Tachycardia   Falls    hx of    GERD (gastroesophageal reflux disease)    H/O hiatal hernia    History of diverticulitis of colon    History of epilepsy    childhood age 47 to 61  ---secondary to high calcium deposts of optic nerve---  none since age 12 with pregency   History of gastric ulcer    History of kidney stones    History of shingles    MARCH 2014--  BACK AREA   History of vulvar dysplasia    S/P  LASER ABLATION 2014   Hypertension    IBS (irritable bowel syndrome)    Mild obstructive sleep apnea    SLEEP STUDY  11-19-2012--  NO CPAP RX (DID NOT MEET CRITIRIA)   Nodule of right lung    BENIGN--- AND STABLE PER  CT  11/2013   Osteoporosis    PAC (premature atrial contraction)    PAT (paroxysmal atrial tachycardia) (HCC)    Seizures (HCC)    none in years were due to tuberous sclerosis- per patient   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--  MAINLY HANDS (INTERNAL SCLEROTIC BONY LESIONS LUMBAR & PELVIS PER CT 11/2013)   Vitamin D deficiency    Past Surgical History:  Procedure Laterality Date   ANTERIOR HIP REVISION Left 10/18/2014   Procedure:  EXCHANGE LEFT HIP BALL OF DIRECT ANTERIOR TOTAL HIP ARTHROPLASTY;  Surgeon: Mcarthur Rossetti, MD;  Location: [facility];  Service: Orthopedics;  Laterality: Left;   AUGMENTATION MAMMAPLASTY     SALINE   BLADDER SURGERY  1991   bladder reconstruction of torsion   CARDIAC CATHETERIZATION  06-26-2001   NORMAL CORONARY ARTERIES   CATARACT EXTRACTION W/ INTRAOCULAR LENS  IMPLANT, BILATERAL     CHOLECYSTECTOMY  [id number]   CO2 LASER APPLICATION N/A [id number]   Procedure: CO2 LASER APPLICATION;  Surgeon: Terrance Mass, MD;  Location: [facility];  Service: Gynecology;  Laterality: N/A;CO2 laser of vaginal and vulvar lesions   COLONOSCOPY     CYSTOSCOPY WITH RETROGRADE PYELOGRAM, URETEROSCOPY AND STENT PLACEMENT Right 11/22/2013   Procedure: CYSTOSCOPY WITH RETROGRADE PYELOGRAM, URETEROSCOPY AND STENT PLACEMENT;  Surgeon: Sharyn Creamer, MD;  Location[facility];  Service: Urology;  Laterality: Right;   CYSTOSCOPY/RETROGRADE/URETEROSCOPY Right 12/01/2013   Procedure: CYSTOSCOPY, RIGHT RETROGRADE/RIGHT DIGITAL URETEROSCOPY, RIGHT URETERAL STENT EXCHANGE;  Surgeon: Sharyn Creamer, MD;  Location: [facility];  Service: Urology;  Laterality: Right;  STAGED RIGHT URETEROSCOPY RIGHT URETER DILATION     DILATION AND CURETTAGE OF UTERUS  multiple --  last one [place] Left 10/16/2014   Procedure: CLOSED MANIPULATION HIP;  Surgeon: Mcarthur Rossetti, MD;  Location: [facility];  Service: Orthopedics;  Laterality: Left;   HOLMIUM LASER APPLICATION Right [id number]   Procedure: HOLMIUM LASER APPLICATION;  Surgeon: Sharyn Creamer, MD;  Location: [facility];  Service: Urology;  Laterality: Right;   KNEE SURGERY Left 11/2016   "scrapped"   [place]   ORIF RIGHT BIMALLEOLAR ANKLE FX  12-18-2003   TOTAL HIP ARTHROPLASTY Left  09/16/2014   Procedure: LEFT TOTAL HIP ARTHROPLASTY ANTERIOR APPROACH;  Surgeon: Mcarthur Rossetti, MD;  Location: [facility];  Service: Orthopedics;  Laterality: Left;   TOTAL HIP ARTHROPLASTY Right 02/17/2015   Procedure: RIGHT TOTAL HIP ARTHROPLASTY ANTERIOR APPROACH;  Surgeon: Mcarthur Rossetti, MD;  Location: [facility];  Service: Orthopedics;  Laterality: Right;   UPPER GASTROINTESTINAL ENDOSCOPY     VAGINAL HYSTERECTOMY  1975   WIDE LOCAL EXCISION OF FOURCHETTE AND PERINEUM  04-06-2009   VULVAR CARCINOMA IN SITU    Allergies  Allergies  Allergen Reactions   Other     All anti-depressants pt states makes her feel as if her body is shutting down.   Sulfa Antibiotics Rash, Hives and Nausea Only   Zoloft [Sertraline Hcl]     Pt can not take SSRI - nausea and rash    Oxycodone Nausea And Vomiting   Contrast Media [Iodinated Diagnostic Agents] Hives and Rash    Patient breaks out in hives    History of Present Illness    [name] is a 73 y.o. female with a hx of hx of ectopic atrial tachycardia, normal coronary arteriography 2003 and normal MPI in 2016, DVT involving PTV of RLE 07/29/18 presents today for follow up after medication changes. Last seen by Dr. Bettina Gavia 02/05/19 for preoperative clearance.  She awaits hip revision for chronic hip pain.   At last office visit noted fatigue and abnormal EKG with nonspecific t-waves. Her HR was 48bpm and beta blocker reduced by half. Echo and thyroid panel were ordered. Her recent CBC was without signs of anemia to contribute to fatigue.   A ZIO was ordered which she wore, however the monitor showed no data. In discussion with the company they say it likely was not activated. Rather than repeat ZIO she was brought into the office for EKG and follow up of fatigue.   Thyroid panel 02/05/19 normal. Echo 02/15/19 with EF 60-65%, impaired LV relaxation, RV normal size/function, no significant valvular abnormalities.   Very pleasant  lady who presents today with her daughter-in-law. TElls me she is excited to have her left hip revision done and is hopeful to gain back some mobility. Exercise tolerance is limited by leg pain due to orthopedic issues. Exercise tolerance 3.97 METS by DASI. She denies chest pain, chest pressure.   Reports she has had lower extremity edema for a number of years, her PCP encouraged her to trial stopping Lasix, but she had significant swelling and shows me pictures. Her edema has resolved since resuming the Lasix. We discussed that her swelling was not a result of heart failure and likely a combination of orthopedic issues and venous insufficiency.   She reports DOE at her baseline. Discussed that her echo shows no evidence of heart failure and that her DOE is likely due to deconditioning.  EKG today SR with  PACs. She tells me she does not notice these events. No palpitations, fluttering, irregular heart beats.     EKGs/Labs/Other Studies Reviewed:   The following studies were reviewed today: Echo 02/15/19 1. The left ventricle has normal systolic function with an ejection fraction of 60-65%. The cavity size was normal. Left ventricular diastolic Doppler parameters are consistent with impaired relaxation.  2. The right ventricle has normal systolic function. The cavity was normal. There is no increase in right ventricular wall thickness.  3. The aorta is normal unless otherwise noted.   FINDINGS  Left Ventricle: The left ventricle has normal systolic function, with an ejection fraction of 60-65%. The cavity size was normal. There is borderline increase in left ventricular wall thickness. Left ventricular diastolic Doppler parameters are  consistent with impaired relaxation.   Right Ventricle: The right ventricle has normal systolic function. The cavity was normal. There is no increase in right ventricular wall thickness.   Left Atrium: Left atrial size was normal in size.   Right Atrium: Right  atrial size was normal in size. Right atrial pressure is estimated at 10 mmHg.   Interatrial Septum: No atrial level shunt detected by color flow Doppler.   Pericardium: There is no evidence of pericardial effusion.   Mitral Valve: The mitral valve is normal in structure. Mitral valve regurgitation is trivial by color flow Doppler.   Tricuspid Valve: The tricuspid valve is normal in structure. Tricuspid valve regurgitation was not visualized by color flow Doppler.   Aortic Valve: The aortic valve is normal in structure. Aortic valve regurgitation is trivial by color flow Doppler.   Pulmonic Valve: The pulmonic valve was normal in structure. Pulmonic valve regurgitation was not assessed by color flow Doppler.   Aorta: The aorta is normal unless otherwise noted.   Venous: The inferior vena cava measures 1.37 cm, is normal in size with greater than 50% respiratory variability.   CTA chest 07/29/2018: 1.  No thoracic aortic aneurysm or dissection 2.  No pulmonary embolus 3.  Indeterminate left posterior 1.0 cm renal lesion. Could represent atypical cyst versus solid mass. Appears to enhance on current examination. Further evaluation warranted. 4.  Diverticulosis. Question sinus tract extending from the sigmoid colon towards the bladder (incompletely evaluated on CTA chest and abdomen) 5.  Osseous findings of tuberous sclerosis   RLE venous duplex 07/29/2018: FINDINGS: .  Common femoral vein: Patent. .  Femoral vein: Patent. .  Greater saphenous vein: Patent. .  Popliteal vein: Patent. .  Anterior tibial vein: Patent. .  Posterior tibial vein: Acute appearing near occlusive hypoechoic deep venous thrombosis .  Peroneal vein: Patent. .  Venous compressibility: Normal. .  Respiratory variation: Normal. .  Waveform response to augmentation: Normal. .  Additional comments: None.  11/2014 Stress Test  Nuclear stress EF: 73%.  The left ventricular ejection fraction is hyperdynamic  (>65%).  The study is normal.  This is a low risk study.  There was no ST segment deviation noted during stress.  No T wave inversion was noted during stress. Negative nuclear stress test for ischemia. Low risk. LVEF 73%.  EKG:  EKG is ordered today.  The ekg ordered today demonstrates SR with PAC and stable nonspecific T wave abnormality (t-wave inversion aVR, V1, III) when compared to previous.   Recent Labs: 01/29/2019: BUN 14; Creatinine, Ser 0.78; Hemoglobin 13.1; Platelets 232; Potassium 3.9; Sodium 140 02/05/2019: TSH 1.850  Recent Lipid Panel No results found for: CHOL, TRIG, HDL, CHOLHDL, VLDL, LDLCALC, LDLDIRECT  Home Medications   Current Meds  Medication Sig   acetaminophen (TYLENOL) 500 MG tablet Take 1,000 mg by mouth every 6 (six) hours as needed for mild pain or headache.    folic acid (FOLVITE) 1 MG tablet Take 1 mg by mouth daily.   furosemide (LASIX) 20 MG tablet TAKE 1 TABLET BY MOUTH DAILY   HYDROcodone-acetaminophen (NORCO/VICODIN) 5-325 MG tablet Take 1 tablet by mouth every 6 (six) hours as needed for moderate pain.   KLOR-CON M20 20 MEQ tablet Take 1 tablet (20 mEq total) by mouth daily.   methocarbamol (ROBAXIN) 750 MG tablet Take 750 mg by mouth 2 (two) times daily as needed for muscle spasms.   metoprolol succinate (TOPROL-XL) 25 MG 24 hr tablet Take 12.5 mg by mouth daily.   naproxen sodium (ALEVE) 220 MG tablet Take 220 mg by mouth as needed.   pantoprazole (PROTONIX) 40 MG tablet Take 40 mg by mouth 2 (two) times a day.   valACYclovir (VALTREX) 1000 MG tablet Take 1,000 mg by mouth as needed. Take 500mg  twice a day for shingles outbreaks as needed   Vitamin D, Ergocalciferol, (DRISDOL) 1.25 MG (50000 UT) CAPS capsule Take 50,000 Units by mouth every Sunday.    Review of Systems      Review of Systems  Constitution: Negative for chills, fever and malaise/fatigue.  Cardiovascular: Positive for dyspnea on exertion (at baseline). Negative for  chest pain, irregular heartbeat, leg swelling, near-syncope, orthopnea, palpitations and syncope.  Respiratory: Negative for cough, shortness of breath and wheezing.   Musculoskeletal: Positive for joint pain.  Gastrointestinal: Negative for nausea and vomiting.  Neurological: Negative for dizziness, light-headedness and weakness.   All other systems reviewed and are otherwise negative except as noted above.  Physical Exam    VS:  BP 128/70 (BP Location: Left Arm, Patient Position: Sitting, Cuff Size: Normal)    Pulse (!) 57    Ht 5\' 3"  (1.6 m)    Wt 204 lb (92.5 kg)    SpO2 100%    BMI 36.14 kg/m  , BMI Body mass index is 36.14 kg/m. GEN: Well nourished, well developed, in no acute distress. HEENT: normal. Neck: Supple, no JVD, carotid bruits, or masses. Cardiac: RRR, no murmurs, rubs, or gallops. No clubbing, cyanosis, edema.  Radials/DP/PT 2+ and equal bilaterally.  Respiratory:  Respirations regular and unlabored, clear to auscultation bilaterally. GI: Soft, nontender, nondistended, BS + x 4. MS: No deformity or atrophy. Skin: Warm and dry, no rash. Neuro:  Strength and sensation are intact. Psych: Normal affect.  Accessory Clinical Findings    ECG personally reviewed by me today - SR rate 63 bpm with PAC and stable nonspecific T wave abnormality (t-wave inversion aVR, V1, III) when compared to previous - no acute changes.  Assessment & Plan    1. Preoperative cardiovascular examination - Upcoming L hip revision with Dr. Ninfa Linden 03/19/19.  Bradycardia has resolved after reduction of Metoprolol dose.  EKG today SR rate 63 bpm with PAC and stable nonspecific T-wave changes.   According to the Revised Cardiac Risk Index (RCRI), her Perioperative Risk of Major Cardiac Event is (%): 0.9. Her Functional Capacity in METs is: 3.97 according to the Duke Activity Status Index (DASI). Her functional capacity is primarily limited by her leg pain and orthopedic issues.   Echo 02/15/19  with normal EF 60-65%, LV with impaired relaxation, RV normal size/function, no significant valvular abnormalities. She is without anginal symptoms. No indication for further cardiac evaluation.  She is deemed acceptable cardiovascular risk for this procedure. Per Dr. Bettina Gavia, her primary cardiologist, please utilize telemetry if admitted for observation after surgery and if need be contact [facility] to see her. Please perform EKG POD1.    This note will be routed to Dr. Ninfa Linden via the Epic routing function.   2. Bradycardia secondary to medication- Resolved. HR 48 at last office visit. Metoprolol dose reduced by half. Reports fatigue has improved. HR remains well controlled. Continue Metoprolol 12.5mg .   3. PAC - Noted on ECG today. Asymptomatic with no palpitations, fluttering. Continue Metoprolol. Of note, PACs were noted on hospital telemetry strip 2018 on my review.   4. Paroxysmal atrial tachycardia - Monitor 2016 with PAT previously evaluated by Dr. Rayann Heman of [facility]. Asymptomatic with no palpitations, fluttering. Rate controlled on Metoprolol. Recent TSH normal.   5. Hx of DVT - High risk postoperative DVT.   6. HTN - BP well controlled today. Follows with her PCP closely. No indication for additional anti-hypertensive therapy.   7. Nonspecific abnormal electrocardiogram - Nonspecific T-wave remains stable compared to EKG 02/05/19. She has t-wave inversion which is stable in aVR, V1, III. Echo 02/15/19 with normal EF, normal LV/RV size, no valvular dysfunction. No chest pain, no acute ST/T wave changes, no indication for ischemic evaluation.   Disposition: Follow up in 1 year(s) with Dr. Bettina Gavia.  Loel Dubonnet, NP 03/03/2019, 2:57 PM

## 2019-03-03 ENCOUNTER — Ambulatory Visit (INDEPENDENT_AMBULATORY_CARE_PROVIDER_SITE_OTHER): Payer: Medicare Other | Admitting: Family

## 2019-03-03 ENCOUNTER — Other Ambulatory Visit: Payer: Self-pay

## 2019-03-03 ENCOUNTER — Encounter: Payer: Self-pay | Admitting: Family

## 2019-03-03 VITALS — BP 128/70 | HR 57 | Ht 63.0 in | Wt 204.0 lb

## 2019-03-03 DIAGNOSIS — I491 Atrial premature depolarization: Secondary | ICD-10-CM | POA: Diagnosis not present

## 2019-03-03 DIAGNOSIS — I10 Essential (primary) hypertension: Secondary | ICD-10-CM

## 2019-03-03 DIAGNOSIS — Z86718 Personal history of other venous thrombosis and embolism: Secondary | ICD-10-CM | POA: Diagnosis not present

## 2019-03-03 DIAGNOSIS — I471 Supraventricular tachycardia: Secondary | ICD-10-CM

## 2019-03-03 DIAGNOSIS — R9431 Abnormal electrocardiogram [ECG] [EKG]: Secondary | ICD-10-CM

## 2019-03-03 DIAGNOSIS — Z01810 Encounter for preprocedural cardiovascular examination: Secondary | ICD-10-CM | POA: Diagnosis not present

## 2019-03-03 NOTE — Patient Instructions (Signed)
Medication Instructions:  No medication changes today.   If you need a refill on your cardiac medications before your next appointment, please call your pharmacy.   Lab work: No lab work today.  If you have labs (blood work) drawn today and your tests are completely normal, you will receive your results only by: Marland Kitchen MyChart Message (if you have MyChart) OR . A paper copy in the mail If you have any lab test that is abnormal or we need to change your treatment, we will call you to review the results.  Testing/Procedures: You had an EKG today. It showed NSR with PACs.  Follow-Up: At [facility], you and your health needs are our priority.  As part of our continuing mission to provide you with exceptional heart care, we have created designated Provider Care Teams.  These Care Teams include your primary Cardiologist (physician) and Advanced Practice Providers (APPs -  Physician Assistants and Nurse Practitioners) who all work together to provide you with the care you need, when you need it. You will need a follow up appointment in 1 years.  Please call our office 2 months in advance to schedule this appointment.  You may see Shirlee More, MD or another member of our [facility] Provider Team in [place]: Jenne Campus, MD . Jyl Heinz, MD . Berniece Salines, MD . Laurann Montana, NP  Any Other Special Instructions Will Be Listed Below (If Applicable).   Premature Atrial Contraction  A premature atrial contraction [facility]) is a kind of irregular heartbeat (arrhythmia). It happens when the heart beats too early and then pauses before beating again. The heart has four areas, or chambers. Normally, electrical signals spread across the heart and make all the chambers beat together. During a PAC, the upper chambers of the heart (atria) beat too early, before they have had time to fill with blood. The heartbeat pauses afterward so the heart can fill with blood for the next beat. Sometimes PAC  can be a warning sign of another type of arrhythmia called atrial fibrillation. Atrial fibrillation may allow blood to pool in the atria and form clots. If a clot travels to the brain, it can cause a stroke. What are the causes? The cause of this condition is often unknown. Sometimes, this condition may be caused by heart disease or injury to the heart.  What increases the risk? You are more likely to develop this condition if:  You are a child.  You are an adult who is 90 years of age or older. Episodes may be triggered by:  Caffeine.  Alcohol.  Tobacco use.  Stimulant drugs.  Some medicines or supplements.  Stress.  Heart disease. What are the signs or symptoms? Symptoms of this condition include:  A feeling that your heart skipped a beat. The first heartbeat after the "skipped" beat may feel more forceful.  A feeling that your heart is fluttering. How is this diagnosed? This condition is diagnosed based on:  Your symptoms.  A physical exam. Your health care provider may listen to your heart.  An electrocardiogram (ECG). This is a test that records the electrical impulses of the heart.  An ambulatory cardiac monitor. This device records your heartbeats for 24 hours or more. You may also have:  An echocardiogram to check for any heart conditions. This is a type of imaging test that uses sound waves (ultrasound) to make images of your heart.  Blood tests. How is this treated? Treatment depends on the frequency of  your symptoms and other risk factors. Treatments may include:  Medicines (beta-blockers).  Catheter ablation. This is done to destroy the part of the heart tissue that sends abnormal signals. In some cases, treatment may not be needed for this condition. Follow these instructions at home: Lifestyle  Do not use any products that contain nicotine or tobacco, such as cigarettes, e-cigarettes, and chewing tobacco. If you need help quitting, ask your health  care provider.  Exercise regularly. Ask your health care provider what type of exercise is safe for you.  Find healthy ways to manage stress.  Try to get at least 7-9 hours of sleep each night, or as much as recommended by your health care provider. Alcohol use  Do not drink alcohol if: ? Your health care provider tells you not to drink. ? You are pregnant, may be pregnant, or are planning to become pregnant. ? Alcohol triggers your episodes.  If you drink alcohol: ? Limit how much you use to:  0-1 drink a day for women.  0-2 drinks a day for men. ? Be aware of how much alcohol is in your drink. In the [place] one drink equals one 12 oz bottle of beer (355 mL), one 5 oz glass of wine (148 mL), or one 1 oz glass of hard liquor (44 mL). General instructions  Take over-the-counter and prescription medicines only as told by your health care provider.  If caffeine triggers episodes, do not eat, drink, or use anything with caffeine in it.  Keep all follow-up visits as told by your health care provider. This is important. Contact a health care provider if:  You feel your heart skipping beats.  Your heart skips beats and you feel dizzy, light-headed, or very tired. Get help right away if you have:  Chest pain.  Trouble breathing.  Any symptoms of a stroke. "BE FAST" is an easy way to remember the main warning signs of a stroke. ? B - Balance. Signs are dizziness, sudden trouble walking, or loss of balance. ? E - Eyes. Signs are trouble seeing or a sudden change in vision. ? F - Face. Signs are sudden weakness or numbness of the face, or the face or eyelid drooping on one side. ? A - Arms. Signs are weakness or numbness in an arm. This happens suddenly and usually on one side of the body. ? S - Speech. Signs are sudden trouble speaking, slurred speech, or trouble understanding what people say. ? T - Time. Time to call emergency services. Write down what time symptoms started.   Other signs of stroke, such as: ? A sudden, severe headache with no known cause. ? Nausea or vomiting. ? Seizure. These symptoms may represent a serious problem that is an emergency. Do not wait to see if the symptoms will go away. Get medical help right away. Call your local emergency services (911 in the [place] Do not drive yourself to the hospital. Summary  A premature atrial contraction [facility]) is a kind of irregular heartbeat (arrhythmia). It happens when the heart beats too early and then pauses before beating again.  Treatment depends on your symptoms and whether you have other underlying heart conditions.  Contact a health care provider if your heart skips beats and you feel dizzy, light-headed, or very tired.  In some cases, this condition may lead to a stroke. "BE FAST" is an easy way to remember the warning signs of stroke. Get help right away if you have any of the "BE  FAST" signs. This information is not intended to replace advice given to you by your health care provider. Make sure you discuss any questions you have with your health care provider. Document Released: 01/21/2014 Document Revised: 02/12/2018 Document Reviewed: 02/12/2018 Elsevier Patient Education  2020 [employer].

## 2019-03-08 ENCOUNTER — Other Ambulatory Visit: Payer: Self-pay | Admitting: Physician Assistant

## 2019-03-08 NOTE — Patient Instructions (Addendum)
DUE TO COVID-19 ONLY ONE VISITOR IS ALLOWED TO COME WITH YOU AND STAY IN THE WAITING ROOM ONLY DURING PRE OP AND PROCEDURE DAY OF SURGERY. THE 1 VISITOR MAY VISIT WITH YOU AFTER SURGERY IN YOUR PRIVATE ROOM DURING VISITING HOURS ONLY! : YOU NEED TO HAVE A COVID 19 TEST ON__Tuesday __10-13-2020___ @__between  ___8:30am and 3:00pm__. THIS TEST MUST BE DONE BEFORE SURGERY, COME  [place], [place] , [address].  ([place]) ONCE YOUR COVID TEST IS COMPLETED, PLEASE BEGIN THE QUARANTINE INSTRUCTIONS AS OUTLINED IN YOUR HANDOUT.                [place]   Your procedure is scheduled on: 03-19-2019   Report to [facility] Main  Entrance   Report to admitting at 10:15AM     Call this number if you have problems the morning of surgery [place], NO CHEWING GUM [place].   Do not eat food After Midnight. YOU MAY HAVE CLEAR LIQUIDS FROM MIDNIGHT UNTIL 9:45AM. At 9:45AM Please finish the prescribed Pre-Surgery ENSURE drink. Nothing by mouth after you finish the ENSURE drink !   CLEAR LIQUID DIET   Foods Allowed                                                                     Foods Excluded  Coffee and tea, regular and decaf                             liquids that you cannot  Plain Jell-O any favor except red or purple                                           see through such as: Fruit ices (not with fruit pulp)                                     milk, soups, orange juice  Iced Popsicles                                    All solid food Carbonated beverages, regular and diet                                    Cranberry, grape and apple juices Sports drinks like Gatorade Lightly seasoned clear broth or consume(fat free) Sugar, honey syrup  Sample Menu Breakfast                                Lunch  Supper Cranberry juice                     Beef broth                            Chicken broth Jell-O                                     Grape juice                           Apple juice Coffee or tea                        Jell-O                                      Popsicle                                                Coffee or tea                        Coffee or tea  _____________________________________________________________________    Take these medicines the morning of surgery with A SIP OF WATER: PROTONIX, VALACYCLOVIR IF NEEDED                                 You may not have any metal on your body including hair pins and              piercings  Do not wear jewelry, make-up, lotions, powders or perfumes, deodorant                  Men may shave face and neck.   Do not bring valuables to the hospital. [place].  Contacts, dentures or bridgework may not be worn into surgery.  YOU MAY BRING A SMALL OVERNIGHT BAG              Please read over the following fact sheets you were given: _____________________________________________________________________             [facility] - Preparing for Surgery Before surgery, you can play an important role.  Because skin is not sterile, your skin needs to be as free of germs as possible.  You can reduce the number of germs on your skin by washing with CHG (chlorahexidine gluconate) soap before surgery.  CHG is an antiseptic cleaner which kills germs and bonds with the skin to continue killing germs even after washing. Please DO NOT use if you have an allergy to CHG or antibacterial soaps.  If your skin becomes reddened/irritated stop using the CHG and inform your nurse when you arrive at Short Stay. Do not shave (including legs and underarms) for at least 48 hours prior to the first CHG shower.  You may shave your face/neck. Please follow these instructions carefully:  1.  Shower  with CHG Soap the night before surgery and the   morning of Surgery.  2.  If you choose to wash your hair, wash your hair first as usual with your  normal  shampoo.  3.  After you shampoo, rinse your hair and body thoroughly to remove the  shampoo.                           4.  Use CHG as you would any other liquid soap.  You can apply chg directly  to the skin and wash                       Gently with a scrungie or clean washcloth.  5.  Apply the CHG Soap to your body ONLY FROM THE NECK DOWN.   Do not use on face/ open                           Wound or open sores. Avoid contact with eyes, ears mouth and genitals (private parts).                       Wash face,  Genitals (private parts) with your normal soap.             6.  Wash thoroughly, paying special attention to the area where your surgery  will be performed.  7.  Thoroughly rinse your body with warm water from the neck down.  8.  DO NOT shower/wash with your normal soap after using and rinsing off  the CHG Soap.                9.  Pat yourself dry with a clean towel.            10.  Wear clean pajamas.            11.  Place clean sheets on your bed the night of your first shower and do not  sleep with pets. Day of Surgery : Do not apply any lotions/deodorants the morning of surgery.  Please wear clean clothes to the hospital/surgery center.  FAILURE TO FOLLOW THESE INSTRUCTIONS MAY RESULT IN THE CANCELLATION OF YOUR SURGERY PATIENT SIGNATURE_________________________________  NURSE SIGNATURE__________________________________  ________________________________________________________________________   [name]  An incentive spirometer is a tool that can help keep your lungs clear and active. This tool measures how well you are filling your lungs with each breath. Taking long deep breaths may help reverse or decrease the chance of developing breathing (pulmonary) problems (especially infection) following:  A long period of time when you are unable to move or be  active. BEFORE THE PROCEDURE   If the spirometer includes an indicator to show your best effort, your nurse or respiratory therapist will set it to a desired goal.  If possible, sit up straight or lean slightly forward. Try not to slouch.  Hold the incentive spirometer in an upright position. INSTRUCTIONS FOR USE  1. Sit on the edge of your bed if possible, or sit up as far as you can in bed or on a chair. 2. Hold the incentive spirometer in an upright position. 3. Breathe out normally. 4. Place the mouthpiece in your mouth and seal your lips tightly around it. 5. Breathe in slowly and as deeply as possible, raising the piston or the ball toward the top of the column. 6. Hold  your breath for 3-5 seconds or for as long as possible. Allow the piston or ball to fall to the bottom of the column. 7. Remove the mouthpiece from your mouth and breathe out normally. 8. Rest for a few seconds and repeat Steps 1 through 7 at least 10 times every 1-2 hours when you are awake. Take your time and take a few normal breaths between deep breaths. 9. The spirometer may include an indicator to show your best effort. Use the indicator as a goal to work toward during each repetition. 10. After each set of 10 deep breaths, practice coughing to be sure your lungs are clear. If you have an incision (the cut made at the time of surgery), support your incision when coughing by placing a pillow or rolled up towels firmly against it. Once you are able to get out of bed, walk around indoors and cough well. You may stop using the incentive spirometer when instructed by your caregiver.  RISKS AND COMPLICATIONS  Take your time so you do not get dizzy or light-headed.  If you are in pain, you may need to take or ask for pain medication before doing incentive spirometry. It is harder to take a deep breath if you are having pain. AFTER USE  Rest and breathe slowly and easily.  It can be helpful to keep track of a log of  your progress. Your caregiver can provide you with a simple table to help with this. If you are using the spirometer at home, follow these instructions: [place] IF:   You are having difficultly using the spirometer.  You have trouble using the spirometer as often as instructed.  Your pain medication is not giving enough relief while using the spirometer.  You develop fever of 100.5 F (38.1 C) or highe SEEK IMMEDIATE MEDICAL CARE IF:   You cough up bloody sputum that had not been present before.  You develop fever of 102 F (38.9 C) or greater.  You develop worsening pain at or near the incision site. MAKE SURE YOU:   Understand these instructions.  Will watch your condition.  Will get help right away if you are not doing well or get worse. Document Released: 09/30/2006 Document Revised: 08/12/2011 Document Reviewed: 12/01/2006 [facility] Patient Information 2014 [place], [place].   ________________________________________________________________________

## 2019-03-09 ENCOUNTER — Other Ambulatory Visit: Payer: Self-pay

## 2019-03-09 ENCOUNTER — Encounter (HOSPITAL_COMMUNITY): Payer: Self-pay

## 2019-03-09 ENCOUNTER — Encounter (HOSPITAL_COMMUNITY)
Admission: RE | Admit: 2019-03-09 | Discharge: 2019-03-09 | Disposition: A | Discharge status: Home / Self Care | Payer: Medicare Other | Source: Ambulatory Visit | Attending: Orthopaedic Surgery | Admitting: Orthopaedic Surgery

## 2019-03-09 DIAGNOSIS — T84031A Mechanical loosening of internal left hip prosthetic joint, initial encounter: Secondary | ICD-10-CM | POA: Insufficient documentation

## 2019-03-09 DIAGNOSIS — K219 Gastro-esophageal reflux disease without esophagitis: Secondary | ICD-10-CM | POA: Insufficient documentation

## 2019-03-09 DIAGNOSIS — Z79899 Other long term (current) drug therapy: Secondary | ICD-10-CM | POA: Insufficient documentation

## 2019-03-09 DIAGNOSIS — Z7901 Long term (current) use of anticoagulants: Secondary | ICD-10-CM | POA: Insufficient documentation

## 2019-03-09 DIAGNOSIS — Z791 Long term (current) use of non-steroidal anti-inflammatories (NSAID): Secondary | ICD-10-CM | POA: Diagnosis not present

## 2019-03-09 DIAGNOSIS — M810 Age-related osteoporosis without current pathological fracture: Secondary | ICD-10-CM | POA: Diagnosis not present

## 2019-03-09 DIAGNOSIS — Z01812 Encounter for preprocedural laboratory examination: Secondary | ICD-10-CM | POA: Insufficient documentation

## 2019-03-09 DIAGNOSIS — Z86718 Personal history of other venous thrombosis and embolism: Secondary | ICD-10-CM | POA: Diagnosis not present

## 2019-03-09 DIAGNOSIS — I10 Essential (primary) hypertension: Secondary | ICD-10-CM | POA: Diagnosis not present

## 2019-03-09 DIAGNOSIS — M1990 Unspecified osteoarthritis, unspecified site: Secondary | ICD-10-CM | POA: Diagnosis not present

## 2019-03-09 DIAGNOSIS — G4733 Obstructive sleep apnea (adult) (pediatric): Secondary | ICD-10-CM | POA: Diagnosis not present

## 2019-03-09 DIAGNOSIS — Z9049 Acquired absence of other specified parts of digestive tract: Secondary | ICD-10-CM | POA: Insufficient documentation

## 2019-03-09 DIAGNOSIS — Z96641 Presence of right artificial hip joint: Secondary | ICD-10-CM | POA: Insufficient documentation

## 2019-03-09 DIAGNOSIS — Z87891 Personal history of nicotine dependence: Secondary | ICD-10-CM | POA: Diagnosis not present

## 2019-03-09 DIAGNOSIS — M5137 Other intervertebral disc degeneration, lumbosacral region: Secondary | ICD-10-CM | POA: Diagnosis not present

## 2019-03-09 DIAGNOSIS — E559 Vitamin D deficiency, unspecified: Secondary | ICD-10-CM | POA: Diagnosis not present

## 2019-03-09 LAB — SURGICAL PCR SCREEN
MRSA, PCR: NEGATIVE
Staphylococcus aureus: NEGATIVE

## 2019-03-09 LAB — BASIC METABOLIC PANEL
Anion gap: 10 (ref 5–15)
BUN: 20 mg/dL (ref 8–23)
CO2: 26 mmol/L (ref 22–32)
Calcium: 9.2 mg/dL (ref 8.9–10.3)
Chloride: 100 mmol/L (ref 98–111)
Creatinine, Ser: 0.72 mg/dL (ref 0.44–1.00)
GFR calc Af Amer: >60 mL/min (ref >60)
GFR calc non Af Amer: >60 mL/min (ref >60)
Glucose, Bld: 89 mg/dL (ref 70–99)
Potassium: 4.1 mmol/L (ref 3.5–5.1)
Sodium: 136 mmol/L (ref 135–145)

## 2019-03-09 LAB — CBC
HCT: 41 % (ref 36.0–46.0)
Hemoglobin: 12.9 g/dL (ref 12.0–15.0)
MCH: 28 pg (ref 26.0–34.0)
MCHC: 31.5 g/dL (ref 30.0–36.0)
MCV: 89.1 fL (ref 80.0–100.0)
Platelets: 213 10*3/uL (ref 150–400)
RBC: 4.6 MIL/uL (ref 3.87–5.11)
RDW: 14.9 % (ref 11.5–15.5)
WBC: 5.9 10*3/uL (ref 4.0–10.5)
nRBC: 0 % (ref 0.0–0.2)

## 2019-03-09 NOTE — Progress Notes (Signed)
PCP - Larence Penning, MD Cardiologist - Richardo Priest, MD  Chest x-ray - ct chest 2020 epic  EKG - epic 2020 Stress Test -  ECHO - epic 2020 Cardiac Cath -   Sleep Study -  CPAP -   Fasting Blood Sugar -  Checks Blood Sugar _____ times a day  Blood Thinner Instructions: Aspirin Instructions: Last Dose:  Anesthesia review:  Pt with hx of DVT in RLE February 2020, reports this has been treated . Also hx of PACs and PATs. She was scheduled for this surgery last September but at PAT she was told her EKG form February was abnormal and she was cancelled due to need of cardiology consult. Patient now has cardiac clearance from Dr Shirlee More on chart 02-05-2019 and had a pre-op cardio exam on 9-30 with Laurann Montana, PA. Today patient denies any cardiac symptoms .   Patient denies shortness of breath, fever, cough and chest pain at PAT appointment   Patient verbalized understanding of instructions that were given to them at the PAT appointment. Patient was also instructed that they will need to review over the PAT instructions again at home before surgery.

## 2019-03-11 NOTE — Progress Notes (Signed)
Anesthesia Chart Review   Case: [id number] Date/Time: 03/19/19 1230   Procedure: REVISION LEFT TOTAL HIP ARTHROPLASTY (Left )   Anesthesia type: Choice   Pre-op diagnosis: Loose Left Hip Prosthesis   Location: [facility] ROOM 09 / [facility]   Surgeon: Mcarthur Rossetti, MD      DISCUSSION:73 y.o. former smoker (50 pack years, quit 06/03/98) with h/o PONV, GERD, HTN, mild OSA (did not meet AHI criteria for OSA syndrome 11/2012), atrial tachycardia (2016), DVT right LE 07/2018 (07/29/18 treated with Eliquis; and history of DVT in pregnancy), loose left hip prosthesis scheduled for above procedure 03/19/2019 with Dr. Jean Rosenthal.   Surgery previous postponed due to new T wave inversions on EKG and need for cardiac evaluation.    Seen by cardiologist, Dr. Shirlee More, 02/05/2019.  Beta blocker reduced at this visit due to bradycardia with rate of 48 and fatigue.  Echo ordered.  She followed up with Laurann Montana, NP 03/03/2019.  Per OV note, "Bradycardia has resolved after reduction of Metoprolol dose. EKG today SR rate 63 bpm with PAC and stable nonspecific T-wave changes. According to the Revised Cardiac Risk Index (RCRI), her Perioperative Risk of Major Cardiac Event is (%): 0.9. Her Functional Capacity in METs is: 3.97 according to the Duke Activity Status Index (DASI). Her functional capacity is primarily limited by her leg pain and orthopedic issues. Echo 02/15/19 with normal EF 60-65%, LV with impaired relaxation, RV normal size/function, no significant valvular abnormalities. She is without anginal symptoms. No indication for further cardiac evaluation.  She is deemed acceptable cardiovascular risk for this procedure. Per Dr. Bettina Gavia, her primary cardiologist, please utilize telemetry if admitted for observation after surgery and if need be contact [facility] to see her. Please perform EKG POD1."  Anticipate pt can proceed with planned procedure barring acute status change.   VS: BP (!) 155/74    Pulse 87   Temp 36.9 C (Oral)   Resp 16   Ht 5\' 3"  (1.6 m)   Wt 83.5 kg   SpO2 100%   BMI 32.59 kg/m   PROVIDERS: Larence Penning, MD is PCP   Shirlee More, MD is Cardiologist  LABS: Labs reviewed: Acceptable for surgery. (all labs ordered are listed, but only abnormal results are displayed)  Labs Reviewed  SURGICAL PCR SCREEN  BASIC METABOLIC PANEL  CBC     IMAGES:   EKG: 03/03/2019 Rate 63 bpm Sinus rhythm with PAC  Nonspecific T wave abnormality Stable compared to previous   CV: Echo 02/15/2019 IMPRESSIONS    1. The left ventricle has normal systolic function with an ejection fraction of 60-65%. The cavity size was normal. Left ventricular diastolic Doppler parameters are consistent with impaired relaxation.  2. The right ventricle has normal systolic function. The cavity was normal. There is no increase in right ventricular wall thickness.  3. The aorta is normal unless otherwise noted.  Myocardial Perfusion Imaging 12/01/2014  Nuclear stress EF: 73%.  The left ventricular ejection fraction is hyperdynamic (>65%).  The study is normal.  This is a low risk study.  There was no ST segment deviation noted during stress.  No T wave inversion was noted during stress.   Negative nuclear stress test for ischemia. Low risk. LVEF 73%. Past Medical History:  Diagnosis Date  . Arthritis   . Cancer ([place])   . DDD (degenerative disc disease), lumbosacral   . Depression    situational  . Diverticulosis   . DVT (deep venous thrombosis) ([place])  right leg 07/26/18  . Dyspnea    with activity  . Dysrhythmia    atrial Tachycardia  . [place]    hx of   . GERD (gastroesophageal reflux disease)   . H/O hiatal hernia   . History of diverticulitis of colon   . History of epilepsy    childhood age 26 to 54  ---secondary to high calcium deposts of optic nerve---  none since age 34 with pregency  . History of gastric ulcer   . History of kidney stones   . History  of shingles    MARCH 2014--  BACK AREA  . History of vulvar dysplasia    S/P  LASER ABLATION 2014  . Hypertension   . IBS (irritable bowel syndrome)   . Mild obstructive sleep apnea    SLEEP STUDY  11-19-2012--  NO CPAP RX (DID NOT MEET CRITIRIA)  . Nodule of right lung    BENIGN--- AND STABLE PER  CT  11/2013  . Osteoporosis   . PAC (premature atrial contraction)   . PAT (paroxysmal atrial tachycardia) ([place])   . PONV (postoperative nausea and vomiting)   . Seizures ([place])    none in years were due to tuberous sclerosis- per patient  . 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 Vitamin D deficiency     Past Surgical History:  Procedure Laterality Date  . ANTERIOR HIP REVISION Left 10/18/2014   Procedure: EXCHANGE LEFT HIP BALL OF DIRECT ANTERIOR TOTAL HIP ARTHROPLASTY;  Surgeon: Mcarthur Rossetti, MD;  Location: [place];  Service: Orthopedics;  Laterality: Left;  . AUGMENTATION MAMMAPLASTY     SALINE  . BLADDER SURGERY  1991   bladder reconstruction of torsion  . CARDIAC CATHETERIZATION  06-26-2001   NORMAL CORONARY ARTERIES  . CATARACT EXTRACTION W/ INTRAOCULAR LENS  IMPLANT, BILATERAL    . CHOLECYSTECTOMY  11-18-2003  . CO2 LASER APPLICATION N/A [id number]   Procedure: CO2 LASER APPLICATION;  Surgeon: Terrance Mass, MD;  Location[facility];  Service: Gynecology;  Laterality: N/A;CO2 laser of vaginal and vulvar lesions  . COLONOSCOPY    . CYSTOSCOPY WITH RETROGRADE PYELOGRAM, URETEROSCOPY AND STENT PLACEMENT Right 11/22/2013   Procedure: CYSTOSCOPY WITH RETROGRADE PYELOGRAM, URETEROSCOPY AND STENT PLACEMENT;  Surgeon: Sharyn Creamer, MD;  Location: [facility];  Service: Urology;  Laterality: Right;  . CYSTOSCOPY/RETROGRADE/URETEROSCOPY Right 12/01/2013   Procedure: CYSTOSCOPY, RIGHT RETROGRADE/RIGHT DIGITAL URETEROSCOPY, RIGHT URETERAL STENT EXCHANGE;   Surgeon: Sharyn Creamer, MD;  Location: [facility];  Service: Urology;  Laterality: Right;  STAGED RIGHT URETEROSCOPY RIGHT URETER DILATION    . DILATION AND CURETTAGE OF UTERUS  multiple -- last one 1975  . GYNECOLOGIC CRYOSURGERY    . HEMORRHOID SURGERY  1970's  . HIP CLOSED REDUCTION Left 10/16/2014   Procedure: CLOSED MANIPULATION HIP;  Surgeon: Mcarthur Rossetti, MD;  [facility];  Service: Orthopedics;  Laterality: Left;  . HOLMIUM LASER APPLICATION Right [id number]   Procedure: HOLMIUM LASER APPLICATION;  Surgeon: Sharyn Creamer, MD;  Location: [facility];  Service: Urology;  Laterality: Right;  . KNEE SURGERY Left 11/2016   "scrapped"  . [place] SURGERY  1985  . ORIF RIGHT BIMALLEOLAR ANKLE FX  12-18-2003  . TOTAL HIP ARTHROPLASTY Left 09/16/2014   Procedure: LEFT TOTAL HIP ARTHROPLASTY ANTERIOR APPROACH;  Surgeon: Mcarthur Rossetti, MD;  [facility];  Service: Orthopedics;  Laterality: Left;  . TOTAL HIP ARTHROPLASTY Right 02/17/2015   Procedure: RIGHT TOTAL HIP ARTHROPLASTY ANTERIOR APPROACH;  Surgeon: Mcarthur Rossetti, MD;  [facility];  Service: Orthopedics;  Laterality: Right;  . UPPER GASTROINTESTINAL ENDOSCOPY    . VAGINAL HYSTERECTOMY  1975  . WIDE LOCAL EXCISION OF FOURCHETTE AND PERINEUM  04-06-2009   VULVAR CARCINOMA IN SITU    MEDICATIONS: . acetaminophen (TYLENOL) 500 MG tablet  . folic acid (FOLVITE) 1 MG tablet  . furosemide (LASIX) 20 MG tablet  . HYDROcodone-acetaminophen (NORCO/VICODIN) 5-325 MG tablet  . KLOR-CON M20 20 MEQ tablet  . methocarbamol (ROBAXIN) 750 MG tablet  . metoprolol succinate (TOPROL-XL) 25 MG 24 hr tablet  . naproxen sodium (ALEVE) 220 MG tablet  . omega-3 acid ethyl esters (LOVAZA) 1 g capsule  . pantoprazole (PROTONIX) 40 MG tablet  . Turmeric 500 MG CAPS  . valACYclovir (VALTREX) 1000 MG tablet  . Vitamin D, Ergocalciferol, (DRISDOL) 1.25 MG (50000 UT) CAPS  capsule   No current facility-administered medications for this encounter.     Maia Plan [facility] Pre-Surgical Testing [phone] 03/11/19 10:22 AM

## 2019-03-11 NOTE — Anesthesia Preprocedure Evaluation (Addendum)
Anesthesia Evaluation  Patient identified by MRN, date of birth, ID band Patient awake    Reviewed: Allergy & Precautions, NPO status , Patient's Chart, lab work & pertinent test results  History of Anesthesia Complications (+) PONV  Airway Mallampati: II  TM Distance: >3 FB Neck ROM: Full    Dental no notable dental hx.    Pulmonary sleep apnea , former smoker,    Pulmonary exam normal breath sounds clear to auscultation       Cardiovascular hypertension, negative cardio ROS Normal cardiovascular exam Rhythm:Regular Rate:Normal     Neuro/Psych Seizures -, Well Controlled,  Depression negative psych ROS   GI/Hepatic Neg liver ROS, GERD  ,  Endo/Other  negative endocrine ROS  Renal/GU negative Renal ROS  negative genitourinary   Musculoskeletal  (+) Arthritis , Osteoarthritis,    Abdominal (+) + obese,   Peds negative pediatric ROS (+)  Hematology negative hematology ROS (+)   Anesthesia Other Findings   Reproductive/Obstetrics negative OB ROS                            Anesthesia Physical Anesthesia Plan  ASA: II  Anesthesia Plan: General   Post-op Pain Management:    Induction: Intravenous  PONV Risk Score and Plan: 4 or greater and Ondansetron, Midazolam, Scopolamine patch - Pre-op, Dexamethasone and Treatment may vary due to age or medical condition  Airway Management Planned: Oral ETT  Additional Equipment:   Intra-op Plan:   Post-operative Plan: Extubation in OR  Informed Consent: I have reviewed the patients History and Physical, chart, labs and discussed the procedure including the risks, benefits and alternatives for the proposed anesthesia with the patient or authorized representative who has indicated his/her understanding and acceptance.     Dental advisory given  Plan Discussed with: CRNA  Anesthesia Plan Comments: (See PAT note 03/09/2019, Konrad Felix, PA-C)       Anesthesia Quick Evaluation

## 2019-03-16 ENCOUNTER — Other Ambulatory Visit (HOSPITAL_COMMUNITY)
Admission: RE | Admit: 2019-03-16 | Discharge: 2019-03-16 | Disposition: A | Discharge status: Home / Self Care | Payer: Medicare Other | Source: Ambulatory Visit | Attending: Orthopaedic Surgery | Admitting: Orthopaedic Surgery

## 2019-03-16 DIAGNOSIS — Z01812 Encounter for preprocedural laboratory examination: Secondary | ICD-10-CM | POA: Insufficient documentation

## 2019-03-16 DIAGNOSIS — Z20828 Contact with and (suspected) exposure to other viral communicable diseases: Secondary | ICD-10-CM | POA: Insufficient documentation

## 2019-03-17 LAB — NOVEL CORONAVIRUS, NAA (HOSP ORDER, SEND-OUT TO REF LAB; TAT 18-24 HRS): SARS-CoV-2, NAA: NOT DETECTED

## 2019-03-18 NOTE — Progress Notes (Signed)
Spoke with patient about time change. Aware to arrive 0730. Npo solids  after mn , clears til 0700. Ensure at 0700 then nothing.

## 2019-03-19 ENCOUNTER — Inpatient Hospital Stay (HOSPITAL_COMMUNITY): Payer: Medicare Other | Admitting: Physician Assistant

## 2019-03-19 ENCOUNTER — Inpatient Hospital Stay (HOSPITAL_COMMUNITY): Payer: Medicare Other

## 2019-03-19 ENCOUNTER — Other Ambulatory Visit: Payer: Self-pay

## 2019-03-19 ENCOUNTER — Inpatient Hospital Stay (HOSPITAL_COMMUNITY)
Admission: RE | Admit: 2019-03-19 | Discharge: 2019-03-22 | DRG: 468 | Disposition: A | Discharge status: Home Health | Payer: Medicare Other | Attending: Orthopaedic Surgery | Admitting: Orthopaedic Surgery

## 2019-03-19 ENCOUNTER — Encounter (HOSPITAL_COMMUNITY)
Admission: RE | Disposition: A | Discharge status: Home / Self Care | Payer: Self-pay | Source: Home / Self Care | Attending: Orthopaedic Surgery

## 2019-03-19 ENCOUNTER — Inpatient Hospital Stay (HOSPITAL_COMMUNITY): Payer: Medicare Other | Admitting: Certified Registered Nurse Anesthetist

## 2019-03-19 ENCOUNTER — Encounter (HOSPITAL_COMMUNITY): Payer: Self-pay | Admitting: General Practice

## 2019-03-19 DIAGNOSIS — M810 Age-related osteoporosis without current pathological fracture: Secondary | ICD-10-CM | POA: Diagnosis present

## 2019-03-19 DIAGNOSIS — T84031D Mechanical loosening of internal left hip prosthetic joint, subsequent encounter: Secondary | ICD-10-CM

## 2019-03-19 DIAGNOSIS — Z419 Encounter for procedure for purposes other than remedying health state, unspecified: Secondary | ICD-10-CM

## 2019-03-19 DIAGNOSIS — Z87891 Personal history of nicotine dependence: Secondary | ICD-10-CM | POA: Diagnosis not present

## 2019-03-19 DIAGNOSIS — Z6832 Body mass index (BMI) 32.0-32.9, adult: Secondary | ICD-10-CM | POA: Diagnosis not present

## 2019-03-19 DIAGNOSIS — T84039A Mechanical loosening of unspecified internal prosthetic joint, initial encounter: Principal | ICD-10-CM | POA: Diagnosis present

## 2019-03-19 DIAGNOSIS — Z96649 Presence of unspecified artificial hip joint: Secondary | ICD-10-CM

## 2019-03-19 DIAGNOSIS — Z20828 Contact with and (suspected) exposure to other viral communicable diseases: Secondary | ICD-10-CM | POA: Diagnosis present

## 2019-03-19 DIAGNOSIS — Z79899 Other long term (current) drug therapy: Secondary | ICD-10-CM | POA: Diagnosis not present

## 2019-03-19 DIAGNOSIS — G4733 Obstructive sleep apnea (adult) (pediatric): Secondary | ICD-10-CM | POA: Diagnosis present

## 2019-03-19 DIAGNOSIS — E669 Obesity, unspecified: Secondary | ICD-10-CM | POA: Diagnosis present

## 2019-03-19 DIAGNOSIS — Z96642 Presence of left artificial hip joint: Secondary | ICD-10-CM

## 2019-03-19 DIAGNOSIS — K219 Gastro-esophageal reflux disease without esophagitis: Secondary | ICD-10-CM | POA: Diagnosis present

## 2019-03-19 DIAGNOSIS — T84031A Mechanical loosening of internal left hip prosthetic joint, initial encounter: Secondary | ICD-10-CM

## 2019-03-19 DIAGNOSIS — I10 Essential (primary) hypertension: Secondary | ICD-10-CM | POA: Diagnosis present

## 2019-03-19 DIAGNOSIS — Y792 Prosthetic and other implants, materials and accessory orthopedic devices associated with adverse incidents: Secondary | ICD-10-CM | POA: Diagnosis present

## 2019-03-19 HISTORY — PX: TOTAL HIP REVISION: SHX763

## 2019-03-19 LAB — TYPE AND SCREEN
ABO/RH(D): O NEG
Antibody Screen: NEGATIVE

## 2019-03-19 SURGERY — TOTAL HIP REVISION
Anesthesia: General | Laterality: Left

## 2019-03-19 MED ORDER — POVIDONE-IODINE 10 % EX SWAB: 2.0000 "application " | Freq: Once | CUTANEOUS | Status: DC

## 2019-03-19 MED ORDER — MIDAZOLAM HCL 5 MG/5ML IJ SOLN
INTRAMUSCULAR | Status: DC | PRN
  Administered 2019-03-19 (×2): 1 mg via INTRAVENOUS

## 2019-03-19 MED ORDER — SODIUM CHLORIDE 0.9 % IV SOLN
INTRAVENOUS | Status: DC
  Administered 2019-03-19 – 2019-03-21 (×4): via INTRAVENOUS

## 2019-03-19 MED ORDER — OXYCODONE HCL 5 MG PO TABS
10.0000 mg | ORAL_TABLET | ORAL | Status: DC | PRN
  Administered 2019-03-20: 10 mg via ORAL
  Filled 2019-03-19: qty 2

## 2019-03-19 MED ORDER — ROCURONIUM BROMIDE 50 MG/5ML IV SOSY
PREFILLED_SYRINGE | INTRAVENOUS | Status: DC | PRN
  Administered 2019-03-19: 10 mg via INTRAVENOUS
  Administered 2019-03-19: 50 mg via INTRAVENOUS

## 2019-03-19 MED ORDER — POTASSIUM CHLORIDE CRYS ER 20 MEQ PO TBCR
20.0000 meq | EXTENDED_RELEASE_TABLET | Freq: Every day | ORAL | Status: DC
  Administered 2019-03-19 – 2019-03-22 (×4): 20 meq via ORAL
  Filled 2019-03-19 (×4): qty 1

## 2019-03-19 MED ORDER — DEXAMETHASONE SODIUM PHOSPHATE 10 MG/ML IJ SOLN
INTRAMUSCULAR | Status: AC
  Filled 2019-03-19: qty 1

## 2019-03-19 MED ORDER — OXYCODONE HCL 5 MG PO TABS: 5.0000 mg | ORAL_TABLET | ORAL | Status: DC | PRN

## 2019-03-19 MED ORDER — DEXAMETHASONE SODIUM PHOSPHATE 10 MG/ML IJ SOLN
INTRAMUSCULAR | Status: DC | PRN
  Administered 2019-03-19: 5 mg via INTRAVENOUS

## 2019-03-19 MED ORDER — HYDROMORPHONE HCL 1 MG/ML IJ SOLN
INTRAMUSCULAR | Status: AC
  Filled 2019-03-19: qty 1

## 2019-03-19 MED ORDER — ONDANSETRON HCL 4 MG/2ML IJ SOLN: 4.0000 mg | Freq: Four times a day (QID) | INTRAMUSCULAR | Status: DC | PRN

## 2019-03-19 MED ORDER — METHOCARBAMOL 500 MG PO TABS
500.0000 mg | ORAL_TABLET | Freq: Four times a day (QID) | ORAL | Status: DC | PRN
  Administered 2019-03-21 – 2019-03-22 (×2): 500 mg via ORAL
  Filled 2019-03-19 (×2): qty 1

## 2019-03-19 MED ORDER — ONDANSETRON HCL 4 MG PO TABS: 4.0000 mg | ORAL_TABLET | Freq: Four times a day (QID) | ORAL | Status: DC | PRN

## 2019-03-19 MED ORDER — BISACODYL 10 MG RE SUPP: 10.0000 mg | Freq: Every day | RECTAL | Status: DC | PRN

## 2019-03-19 MED ORDER — POLYETHYLENE GLYCOL 3350 17 G PO PACK: 17.0000 g | PACK | Freq: Every day | ORAL | Status: DC | PRN

## 2019-03-19 MED ORDER — FENTANYL CITRATE (PF) 100 MCG/2ML IJ SOLN
INTRAMUSCULAR | Status: DC | PRN
  Administered 2019-03-19 (×5): 50 ug via INTRAVENOUS
  Administered 2019-03-19: 100 ug via INTRAVENOUS
  Administered 2019-03-19: 50 ug via INTRAVENOUS
  Administered 2019-03-19 (×2): 100 ug via INTRAVENOUS

## 2019-03-19 MED ORDER — ALUM & MAG HYDROXIDE-SIMETH 200-200-20 MG/5ML PO SUSP: 30.0000 mL | ORAL | Status: DC | PRN

## 2019-03-19 MED ORDER — ASPIRIN 81 MG PO CHEW
81.0000 mg | CHEWABLE_TABLET | Freq: Two times a day (BID) | ORAL | Status: DC
  Administered 2019-03-19 – 2019-03-22 (×6): 81 mg via ORAL
  Filled 2019-03-19 (×6): qty 1

## 2019-03-19 MED ORDER — SUGAMMADEX SODIUM 200 MG/2ML IV SOLN
INTRAVENOUS | Status: DC | PRN
  Administered 2019-03-19: 175 mg via INTRAVENOUS

## 2019-03-19 MED ORDER — PHENYLEPHRINE HCL (PRESSORS) 10 MG/ML IV SOLN
INTRAVENOUS | Status: DC | PRN
  Administered 2019-03-19 (×2): 80 ug via INTRAVENOUS

## 2019-03-19 MED ORDER — FENTANYL CITRATE (PF) 250 MCG/5ML IJ SOLN
INTRAMUSCULAR | Status: AC
  Filled 2019-03-19: qty 5

## 2019-03-19 MED ORDER — ROCURONIUM BROMIDE 10 MG/ML (PF) SYRINGE
PREFILLED_SYRINGE | INTRAVENOUS | Status: AC
  Filled 2019-03-19: qty 10

## 2019-03-19 MED ORDER — 0.9 % SODIUM CHLORIDE (POUR BTL) OPTIME
TOPICAL | Status: DC | PRN
  Administered 2019-03-19: 1000 mL

## 2019-03-19 MED ORDER — LIDOCAINE 2% (20 MG/ML) 5 ML SYRINGE
INTRAMUSCULAR | Status: AC
  Filled 2019-03-19: qty 5

## 2019-03-19 MED ORDER — STERILE WATER FOR IRRIGATION IR SOLN
Status: DC | PRN
  Administered 2019-03-19: 2000 mL

## 2019-03-19 MED ORDER — OMEGA-3-ACID ETHYL ESTERS 1 G PO CAPS
1.0000 g | ORAL_CAPSULE | Freq: Two times a day (BID) | ORAL | Status: DC
  Administered 2019-03-19 – 2019-03-22 (×6): 1 g via ORAL
  Filled 2019-03-19 (×6): qty 1

## 2019-03-19 MED ORDER — ONDANSETRON HCL 4 MG/2ML IJ SOLN
INTRAMUSCULAR | Status: DC | PRN
  Administered 2019-03-19: 4 mg via INTRAVENOUS

## 2019-03-19 MED ORDER — MENTHOL 3 MG MT LOZG
1.0000 | LOZENGE | OROMUCOSAL | Status: DC | PRN
  Administered 2019-03-21: 3 mg via ORAL
  Filled 2019-03-19: qty 9

## 2019-03-19 MED ORDER — CEFAZOLIN SODIUM-DEXTROSE 1-4 GM/50ML-% IV SOLN
1.0000 g | Freq: Four times a day (QID) | INTRAVENOUS | Status: AC
  Administered 2019-03-19 – 2019-03-20 (×2): 1 g via INTRAVENOUS
  Filled 2019-03-19 (×2): qty 50

## 2019-03-19 MED ORDER — TRANEXAMIC ACID-NACL 1000-0.7 MG/100ML-% IV SOLN
1000.0000 mg | INTRAVENOUS | Status: AC
  Administered 2019-03-19: 13:00:00 1000 mg via INTRAVENOUS
  Filled 2019-03-19: qty 100

## 2019-03-19 MED ORDER — LABETALOL HCL 5 MG/ML IV SOLN
INTRAVENOUS | Status: DC | PRN
  Administered 2019-03-19 (×4): 2.5 mg via INTRAVENOUS

## 2019-03-19 MED ORDER — PROPOFOL 10 MG/ML IV BOLUS
INTRAVENOUS | Status: DC | PRN
  Administered 2019-03-19: 130 mg via INTRAVENOUS

## 2019-03-19 MED ORDER — PHENOL 1.4 % MT LIQD
1.0000 | OROMUCOSAL | Status: DC | PRN
  Filled 2019-03-19: qty 177

## 2019-03-19 MED ORDER — METHOCARBAMOL 500 MG IVPB - SIMPLE MED
INTRAVENOUS | Status: AC
  Filled 2019-03-19: qty 50

## 2019-03-19 MED ORDER — SUCCINYLCHOLINE CHLORIDE 200 MG/10ML IV SOSY
PREFILLED_SYRINGE | INTRAVENOUS | Status: AC
  Filled 2019-03-19: qty 10

## 2019-03-19 MED ORDER — FENTANYL CITRATE (PF) 100 MCG/2ML IJ SOLN
INTRAMUSCULAR | Status: AC
  Filled 2019-03-19: qty 2

## 2019-03-19 MED ORDER — METOPROLOL SUCCINATE ER 25 MG PO TB24
12.5000 mg | ORAL_TABLET | Freq: Every day | ORAL | Status: DC
  Administered 2019-03-19 – 2019-03-21 (×3): 12.5 mg via ORAL
  Filled 2019-03-19 (×3): qty 1

## 2019-03-19 MED ORDER — PROPOFOL 10 MG/ML IV BOLUS
INTRAVENOUS | Status: AC
  Filled 2019-03-19: qty 20

## 2019-03-19 MED ORDER — MIDAZOLAM HCL 2 MG/2ML IJ SOLN
INTRAMUSCULAR | Status: AC
  Filled 2019-03-19: qty 2

## 2019-03-19 MED ORDER — CEFAZOLIN SODIUM-DEXTROSE 2-4 GM/100ML-% IV SOLN
2.0000 g | INTRAVENOUS | Status: AC
  Administered 2019-03-19: 13:00:00 2 g via INTRAVENOUS
  Filled 2019-03-19: qty 100

## 2019-03-19 MED ORDER — DIPHENHYDRAMINE HCL 12.5 MG/5ML PO ELIX
12.5000 mg | ORAL_SOLUTION | ORAL | Status: DC | PRN
  Administered 2019-03-19: 25 mg via ORAL
  Administered 2019-03-20: 12.5 mg via ORAL
  Filled 2019-03-19: qty 5
  Filled 2019-03-19: qty 10

## 2019-03-19 MED ORDER — VITAMIN D (ERGOCALCIFEROL) 1.25 MG (50000 UNIT) PO CAPS
50000.0000 [IU] | ORAL_CAPSULE | ORAL | Status: DC
  Administered 2019-03-21: 50000 [IU] via ORAL
  Filled 2019-03-19: qty 1

## 2019-03-19 MED ORDER — DOCUSATE SODIUM 100 MG PO CAPS
100.0000 mg | ORAL_CAPSULE | Freq: Two times a day (BID) | ORAL | Status: DC
  Administered 2019-03-19 – 2019-03-22 (×6): 100 mg via ORAL
  Filled 2019-03-19 (×6): qty 1

## 2019-03-19 MED ORDER — LIDOCAINE 2% (20 MG/ML) 5 ML SYRINGE
INTRAMUSCULAR | Status: DC | PRN
  Administered 2019-03-19: 80 mg via INTRAVENOUS

## 2019-03-19 MED ORDER — GABAPENTIN 100 MG PO CAPS
100.0000 mg | ORAL_CAPSULE | Freq: Three times a day (TID) | ORAL | Status: DC
  Administered 2019-03-19 – 2019-03-22 (×8): 100 mg via ORAL
  Filled 2019-03-19 (×10): qty 1

## 2019-03-19 MED ORDER — SODIUM CHLORIDE 0.9 % IV SOLN
INTRAVENOUS | Status: DC | PRN
  Administered 2019-03-19: 25 ug/min via INTRAVENOUS

## 2019-03-19 MED ORDER — FOLIC ACID 1 MG PO TABS
1.0000 mg | ORAL_TABLET | Freq: Every day | ORAL | Status: DC
  Administered 2019-03-20 – 2019-03-22 (×3): 1 mg via ORAL
  Filled 2019-03-19 (×3): qty 1

## 2019-03-19 MED ORDER — METHOCARBAMOL 500 MG IVPB - SIMPLE MED
500.0000 mg | Freq: Four times a day (QID) | INTRAVENOUS | Status: DC | PRN
  Administered 2019-03-19: 500 mg via INTRAVENOUS
  Filled 2019-03-19: qty 50

## 2019-03-19 MED ORDER — PROMETHAZINE HCL 25 MG/ML IJ SOLN: 6.2500 mg | INTRAMUSCULAR | Status: DC | PRN

## 2019-03-19 MED ORDER — SCOPOLAMINE 1 MG/3DAYS TD PT72
MEDICATED_PATCH | TRANSDERMAL | Status: AC
  Filled 2019-03-19: qty 1

## 2019-03-19 MED ORDER — SCOPOLAMINE 1 MG/3DAYS TD PT72
MEDICATED_PATCH | TRANSDERMAL | Status: DC | PRN
  Administered 2019-03-19: 1 via TRANSDERMAL

## 2019-03-19 MED ORDER — HYDROMORPHONE HCL 1 MG/ML IJ SOLN
0.2500 mg | INTRAMUSCULAR | Status: DC | PRN
  Administered 2019-03-19: 0.5 mg via INTRAVENOUS
  Administered 2019-03-19 (×2): 0.25 mg via INTRAVENOUS
  Administered 2019-03-19: 0.5 mg via INTRAVENOUS
  Filled 2019-03-19: qty 1

## 2019-03-19 MED ORDER — METOCLOPRAMIDE HCL 5 MG PO TABS: 5.0000 mg | ORAL_TABLET | Freq: Three times a day (TID) | ORAL | Status: DC | PRN

## 2019-03-19 MED ORDER — METOCLOPRAMIDE HCL 5 MG/ML IJ SOLN: 5.0000 mg | Freq: Three times a day (TID) | INTRAMUSCULAR | Status: DC | PRN

## 2019-03-19 MED ORDER — ACETAMINOPHEN 325 MG PO TABS: 325.0000 mg | ORAL_TABLET | Freq: Four times a day (QID) | ORAL | Status: DC | PRN

## 2019-03-19 MED ORDER — FUROSEMIDE 20 MG PO TABS
20.0000 mg | ORAL_TABLET | Freq: Every day | ORAL | Status: DC
  Administered 2019-03-19 – 2019-03-22 (×4): 20 mg via ORAL
  Filled 2019-03-19 (×4): qty 1

## 2019-03-19 MED ORDER — PANTOPRAZOLE SODIUM 40 MG PO TBEC
40.0000 mg | DELAYED_RELEASE_TABLET | Freq: Two times a day (BID) | ORAL | Status: DC
  Administered 2019-03-19 – 2019-03-22 (×6): 40 mg via ORAL
  Filled 2019-03-19 (×6): qty 1

## 2019-03-19 MED ORDER — SODIUM CHLORIDE 0.9 % IR SOLN
Status: DC | PRN
  Administered 2019-03-19: 1000 mL

## 2019-03-19 MED ORDER — LACTATED RINGERS IV SOLN
INTRAVENOUS | Status: DC
  Administered 2019-03-19 (×3): via INTRAVENOUS

## 2019-03-19 MED ORDER — HYDROMORPHONE HCL 1 MG/ML IJ SOLN
0.5000 mg | INTRAMUSCULAR | Status: DC | PRN
  Administered 2019-03-19: 1 mg via INTRAVENOUS
  Filled 2019-03-19: qty 1

## 2019-03-19 MED ORDER — CHLORHEXIDINE GLUCONATE 4 % EX LIQD: 60.0000 mL | Freq: Once | CUTANEOUS | Status: DC

## 2019-03-19 MED ORDER — ONDANSETRON HCL 4 MG/2ML IJ SOLN
INTRAMUSCULAR | Status: AC
  Filled 2019-03-19: qty 2

## 2019-03-19 SURGICAL SUPPLY — 58 items
BAG DECANTER FOR FLEXI CONT (MISCELLANEOUS) ×3 IMPLANT
BAG SPEC THK2 15X12 ZIP CLS (MISCELLANEOUS) ×1
BAG ZIPLOCK 12X15 (MISCELLANEOUS) ×3 IMPLANT
BLADE EXTENDED COATED 6.5IN (ELECTRODE) ×3 IMPLANT
BLADE SAW SAG 73X25 THK (BLADE)
BLADE SAW SGTL 73X25 THK (BLADE) IMPLANT
BRUSH FEMORAL CANAL (MISCELLANEOUS) ×3 IMPLANT
CLOSURE WOUND 1/2 X4 (GAUZE/BANDAGES/DRESSINGS)
COVER BACK TABLE 60X90IN (DRAPES) ×2 IMPLANT
COVER SURGICAL LIGHT HANDLE (MISCELLANEOUS) ×3 IMPLANT
COVER WAND RF STERILE (DRAPES) IMPLANT
DRAPE HIP W/POCKET STRL (MISCELLANEOUS) ×3 IMPLANT
DRAPE POUCH INSTRU U-SHP 10X18 (DRAPES) ×3 IMPLANT
DRAPE SURG 17X11 SM STRL (DRAPES) ×3 IMPLANT
DRAPE U-SHAPE 47X51 STRL (DRAPES) ×3 IMPLANT
DRSG AQUACEL AG ADV 3.5X10 (GAUZE/BANDAGES/DRESSINGS) ×2 IMPLANT
DRSG AQUACEL AG ADV 3.5X14 (GAUZE/BANDAGES/DRESSINGS) IMPLANT
DRSG PAD ABDOMINAL 8X10 ST (GAUZE/BANDAGES/DRESSINGS) IMPLANT
ELECT REM PT RETURN 15FT ADLT (MISCELLANEOUS) ×3 IMPLANT
EVACUATOR 1/8 PVC DRAIN (DRAIN) ×3 IMPLANT
FACESHIELD WRAPAROUND (MASK) ×12 IMPLANT
FACESHIELD WRAPAROUND OR TEAM (MASK) ×4 IMPLANT
GAUZE SPONGE 4X4 12PLY STRL (GAUZE/BANDAGES/DRESSINGS) IMPLANT
GAUZE XEROFORM 1X8 LF (GAUZE/BANDAGES/DRESSINGS) ×2 IMPLANT
GLOVE BIO SURGEON STRL SZ7.5 (GLOVE) ×5 IMPLANT
GLOVE BIOGEL PI IND STRL 8 (GLOVE) ×2 IMPLANT
GLOVE BIOGEL PI INDICATOR 8 (GLOVE) ×4
GLOVE ECLIPSE 8.0 STRL XLNG CF (GLOVE) ×3 IMPLANT
GLOVE INDICATOR 8.0 STRL GRN (GLOVE) ×4 IMPLANT
GOWN STRL REUS W/TWL XL LVL3 (GOWN DISPOSABLE) ×6 IMPLANT
HANDPIECE INTERPULSE COAX TIP (DISPOSABLE) ×3
HEAD FEM STD 32X+5 STRL (Hips) IMPLANT
HEAD FEM STD 32X+9 STRL (Hips) ×2 IMPLANT
KIT TURNOVER KIT A (KITS) IMPLANT
MANIFOLD NEPTUNE II (INSTRUMENTS) ×3 IMPLANT
MARKER SKIN DUAL TIP RULER LAB (MISCELLANEOUS) ×3 IMPLANT
PINN ALTRX NEUT ID X OD 32X48 ×2 IMPLANT
PROTECTOR NERVE ULNAR (MISCELLANEOUS) ×3 IMPLANT
SET HNDPC FAN SPRY TIP SCT (DISPOSABLE) ×1 IMPLANT
SLEEVE SURGEON STRL (DRAPES) ×2 IMPLANT
SPONGE LAP 18X18 RF (DISPOSABLE) ×3 IMPLANT
STAPLER VISISTAT 35W (STAPLE) ×2 IMPLANT
STEM FEM CMNTLSS LG AML 13.5 (Hips) ×2 IMPLANT
STRIP CLOSURE SKIN 1/2X4 (GAUZE/BANDAGES/DRESSINGS) IMPLANT
SUT ETHIBOND NAB CT1 #1 30IN (SUTURE) ×9 IMPLANT
SUT MNCRL AB 4-0 PS2 18 (SUTURE) IMPLANT
SUT NYLON 3 0 (SUTURE) IMPLANT
SUT VIC AB 0 CT1 27 (SUTURE) ×6
SUT VIC AB 0 CT1 27XBRD ANTBC (SUTURE) IMPLANT
SUT VIC AB 1 CT1 36 (SUTURE) ×6 IMPLANT
SUT VIC AB 2-0 CT1 27 (SUTURE) ×9
SUT VIC AB 2-0 CT1 TAPERPNT 27 (SUTURE) ×3 IMPLANT
SYR 50ML LL SCALE MARK (SYRINGE) ×3 IMPLANT
TOWEL OR 17X26 10 PK STRL BLUE (TOWEL DISPOSABLE) ×6 IMPLANT
TOWEL OR NON WOVEN STRL DISP B (DISPOSABLE) ×3 IMPLANT
TRAY FOLEY MTR SLVR 14FR STAT (SET/KITS/TRAYS/PACK) ×2 IMPLANT
TUBE KAMVAC SUCTION (TUBING) IMPLANT
YANKAUER SUCT BULB TIP 10FT TU (MISCELLANEOUS) ×3 IMPLANT

## 2019-03-19 NOTE — Anesthesia Postprocedure Evaluation (Signed)
Anesthesia Post Note  Patient: [name]  Procedure(s) Performed: REVISION LEFT TOTAL HIP ARTHROPLASTY (Left )     Patient location during evaluation: PACU Anesthesia Type: General Level of consciousness: awake and alert Pain management: pain level controlled Vital Signs Assessment: post-procedure vital signs reviewed and stable Respiratory status: spontaneous breathing, nonlabored ventilation and respiratory function stable Cardiovascular status: blood pressure returned to baseline and stable Postop Assessment: no apparent nausea or vomiting Anesthetic complications: no    Last Vitals:  Vitals:   03/19/19 1700 03/19/19 1715  BP: 134/70 129/75  Pulse: 94 75  Resp: 13 10  Temp:    SpO2: 100% 100%    Last Pain:  Vitals:   03/19/19 1715  TempSrc:   PainSc: Asleep                 Lynda Rainwater

## 2019-03-19 NOTE — Transfer of Care (Signed)
Immediate Anesthesia Transfer of Care Note  Patient: [name]  Procedure(s) Performed: REVISION LEFT TOTAL HIP ARTHROPLASTY (Left )  Patient Location: PACU  Anesthesia TypeGA  Level of Consciousness: awake, alert , oriented and patient cooperative  Airway & Oxygen Therapy: Patient Spontanous Breathing and Patient connected to face mask oxygen  Post-op Assessment: Report given to RN, Post -op Vital signs reviewed and stable and Patient moving all extremities X 4  Post vital signs: stable  Last Vitals:  Vitals Value Taken Time  BP 171/101 03/19/19 1600  Temp    Pulse 92 03/19/19 1602  Resp 12 03/19/19 1602  SpO2 98 % 03/19/19 1602  Vitals shown include unvalidated device data.  Last Pain:  Vitals:   03/19/19 0753  TempSrc:   PainSc: 0-No pain         Complications: No apparent anesthesia complications

## 2019-03-19 NOTE — Op Note (Signed)
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  PROCEDURE:  Left hip revision arthroplasty, femoral component with also changing out the polyethylene liner and hip ball.  IMPLANTS:  DePuy 13.5 large stature AML femoral component, size 32+4 neutral polyethylene liner for a size 48 acetabular component, size 32+9 metal hip ball.  SURGEON:  Lind Guest. Ninfa Linden, MD  ASSISTANT:  Erskine Emery, PA-C  ANESTHESIA:  General.  ANTIBIOTICS:  Two grams IV Ancef.  ESTIMATED BLOOD LOSS:  350 mL.  COMPLICATIONS:  None.  INDICATIONS:  The patient is a 73 year old female who has bilateral total hip arthroplasties.  The left one was done in early 2016 and the right was done in later 2016.  She has had left hip pain for a long period of time and x-rays and radiographic  evidence suggested loosening of the femoral component.  There is a periosteal reaction on the femur and definite lucency around the implant on that side.  She has never showed any evidence of infection and she has been dealing with thigh pain for many  years now.  At this point, we have recommended revision arthroplasty of the femoral component.  We do feel that it is loose and again she shows no evidence of infection on her clinical exam or laboratory exam.  We had a long and thorough discussion about  revision surgery.  We talked about the risk of acute blood loss anemia, nerve or vessel injury, infection, fracture, DVT, implant failure.  We talked about goals being hopefully to decrease pain, improve mobility and overall improve quality of life.  DESCRIPTION OF PROCEDURE:  After informed consent was obtained  and appropriate left hip was marked, she was brought to the operating room.  General anesthesia was obtained while she was on a stretcher.  Traction boots were placed on both her feet.  Next,  she was placed supine on the Hana fracture table with the perineal post in place, both legs in line skeletal traction device and no traction applied.  Her left operative hip was prepped and draped with DuraPrep and sterile drapes.  A time-out was called  to identify correct patient and correct left hip.  We then went through her previous incision, carrying this proximally and distally.  There was an anterior approach to the hip.  We dissected down to the deep tissues and was able to divide the tensor  fascia.  We dissected down to the previous hip capsule incision, opened the hip capsule and did not find any effusion at all and there was no evidence of infection that we could see.  We did remove significant scar tissue around the hip itself.  We then  dislocated the hip and removed the previous hip ball.  We then assessed the femoral component with the leg extended and externally rotated and placed underneath the other leg on the Hana table.  The femoral component with obviously loose and we were able  to easily remove it without difficulty at all.  We then irrigated out the femoral canal and removed as much "sludge" which was a biofilm around the femoral canal.  We then began reaming using the AML reaming system going from a size 8 reamer in 5 mm  increments up to a  size 13 reamer.  We then broached from a size 10 broach up to a size 13.5 large stature broach.  With the broach in place, we trialed a standard offset femoral neck and a 32+5 metal hip ball because we felt that we had the broach  higher and it reduced in the pelvis easily.  We then felt good about that, so we dislocated the hip and removed the trial components.  We assessed the acetabular component and removed its liner.  The acetabular component was well  bone ingrown so we  placed a new 32+4 neutral polyethylene liner for that size 48 acetabular component.  We then placed our real AML femoral component with large offset size 13.5 and went with the real 32+5 hip ball.  We reduced this in the acetabulum and we absolutely felt  there was just a slight bit of instability, so I felt that we need to go up to longer hip ball so we dislocated the hip and removed the 32+5 metal hip ball and placed this with a 32+9 hip ball.  We reduced this in the acetabulum.  We were pleased with  range of motion and stability.  On clinical exam and radiographically we were pleased with the placement of the implants.  We then irrigated the soft tissue with normal saline solution using pulsatile lavage.  We closed the remnants of the joint capsule  and deep tissue we could using #1 Ethibond suture.  We closed the tensor fascia with #1 Vicryl followed by 0 Vicryl in the deep tissue, 2-0 Vicryl subcutaneous tissue and interrupted staples on the skin.  Xeroform and Aquacel dressing was applied.  She  was taken off the Hana table, awakened, extubated, and taken to recovery room in stable condition.  All final counts were correct.  There were no complications noted.  Of note, Benita Stabile, PA-C, assisted in the entire case.  His assistance was crucial for  facilitating all aspects of this case.  TN/NUANCE  D:03/19/2019 T:03/19/2019 JOB:[id number]

## 2019-03-19 NOTE — Brief Op Note (Signed)
03/19/2019  3:19 PM  PATIENT:  [name]  73 y.o. female  PRE-OPERATIVE DIAGNOSIS:  Loose Left Hip Prosthesis  POST-OPERATIVE DIAGNOSIS:  Loose Left Hip Prosthesis  PROCEDURE:  Procedure(s): REVISION LEFT TOTAL HIP ARTHROPLASTY (Left)  SURGEON:  Surgeon(s) and Role:    Mcarthur Rossetti, MD - Primary  PHYSICIAN ASSISTANT:  Benita Stabile, PA-C  ANESTHESIA:   general  EBL:  350 mL   COUNTS:  YES  DICTATION: .Other Dictation: Dictation Number [id number]  PLAN OF CARE: Admit to inpatient   PATIENT DISPOSITION:  PACU - hemodynamically stable.   Delay start of Pharmacological VTE agent (>24hrs) due to surgical blood loss or risk of bleeding: no

## 2019-03-19 NOTE — Anesthesia Procedure Notes (Signed)
Procedure Name: Intubation Date/Time: 03/19/2019 1:14 PM Performed by: Montel Clock, CRNA Pre-anesthesia Checklist: Patient identified, Emergency Drugs available, Suction available, Patient being monitored and Timeout performed Patient Re-evaluated:Patient Re-evaluated prior to induction Oxygen Delivery Method: Circle system utilized Preoxygenation: Pre-oxygenation with 100% oxygen Induction Type: IV induction Ventilation: Mask ventilation without difficulty Laryngoscope Size: Mac and 3 Grade View: Grade II Tube type: Oral Tube size: 7.0 mm Number of attempts: 1 Airway Equipment and Method: Stylet Placement Confirmation: ETT inserted through vocal cords under direct vision,  positive ETCO2 and breath sounds checked- equal and bilateral Secured at: 21 cm Tube secured with: Tape Dental Injury: Teeth and Oropharynx as per pre-operative assessment  Comments: Grade 2 with downward laryngeal pressure.

## 2019-03-19 NOTE — H&P (Signed)
TOTAL HIP REVISION ADMISSION H&P  Patient is admitted for left revision total hip arthroplasty.  Subjective:  Chief Complaint: left hip pain  HPI: [name], 73 y.o. female, has a history of pain and functional disability in the left hip due to aseptic loosening of the left hip femoral component and patient has failed non-surgical conservative treatments for greater than 12 weeks to include NSAID's and/or analgesics, flexibility and strengthening excercises, supervised PT with diminished ADL's post treatment, use of assistive devices, weight reduction as appropriate and activity modification. The indications for the revision total hip arthroplasty are loosening of one or more components.  Onset of symptoms was gradual starting 2 years ago with gradually worsening course since that time.  Prior procedures on the left hip include arthroplasty.  Patient currently rates pain in the left hip at 10 out of 10 with activity.  There is night pain, worsening of pain with activity and weight bearing, pain that interfers with activities of daily living and pain with passive range of motion. Patient has evidence of prosthetic loosening by imaging studies.  This condition presents safety issues increasing the risk of falls.  There is no current active infection.  Patient Active Problem List   Diagnosis Date Noted  . Femoral loosening of prosthetic left hip ([place]) 03/19/2019  . PAT (paroxysmal atrial tachycardia) ([place])   . PAC (premature atrial contraction)   . Impingement syndrome of right shoulder 02/10/2018  . Chronic right shoulder pain 08/05/2017  . Chronic left shoulder pain 08/05/2017  . Chronic pain of right knee 08/05/2017  . Chronic pain of both shoulders 06/10/2017  . Chronic pain of left knee 06/10/2017  . Status post arthroscopy of left knee 10/24/2016  . Acute pyelonephritis 08/18/2016  . Chest pressure 08/18/2016  . Essential hypertension 08/18/2016  . Hyponatremia 08/18/2016  .  Diastolic dysfunction 0000000  . AKI (acute kidney injury) ([place]) 08/18/2016  . Sepsis ([place])   . Acute lower UTI   . Tachycardia   . Osteoarthritis of right hip 02/17/2015  . 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 Atrial tachycardia ([place]) 12/12/2014  . 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 Recurrent dislocation of hip 10/17/2014  . Dislocation of internal left hip prosthesis ([place]) 10/16/2014  . Ectopic atrial rhythm 10/12/2014  . Atrial bigeminy 10/12/2014  . Osteoarthritis of left hip 09/16/2014  . Status post total replacement of left hip 09/16/2014  . ASCUS favoring benign 09/05/2014  . History of vitamin B deficiency 05/02/2014  . Acute bronchitis 04/18/2014  . Oral aphthous ulcer 04/18/2014  . Edema 12/21/2013  . Irregular heart beat 12/21/2013  . Anuria 11/12/2013  . History of cervical dysplasia 09/21/2013  . Rectocele, female 09/21/2013  . Kidney stone 09/21/2013  . Rash and nonspecific skin eruption 06/25/2013  . Obesity (BMI 30-39.9) 06/16/2013  . IBS (irritable bowel syndrome) 12/22/2012  . Other sleep disturbances 10/27/2012  . Elevated BP 09/29/2012  . Weight gain 08/17/2012  . Osteopenia 08/17/2012  . Vulvar lesion 08/10/2012  . 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 Condyloma acuminatum in female 07/14/2012  . Shingles 07/14/2012  . Vitamin D deficiency 07/14/2012   Past Medical History:  Diagnosis Date  . Arthritis   . Cancer ([place])   . DDD (degenerative disc disease), lumbosacral   . Depression    situational  . Diverticulosis   . DVT (deep venous thrombosis) ([place])    right leg 07/26/18  . Dyspnea  with activity  . Dysrhythmia    atrial Tachycardia  . Falls    hx of   . GERD (gastroesophageal reflux disease)   . H/O hiatal hernia   . History of diverticulitis of colon   . History of epilepsy    childhood age 72 to 31  ---secondary to high calcium deposts of optic nerve---  none since age 61 with  pregency  . History of gastric ulcer   . History of kidney stones   . History of shingles    MARCH 2014--  BACK AREA  . History of vulvar dysplasia    S/P  LASER ABLATION 2014  . Hypertension   . IBS (irritable bowel syndrome)   . Mild obstructive sleep apnea    SLEEP STUDY  11-19-2012--  NO CPAP RX (DID NOT MEET CRITIRIA)  . Nodule of right lung    BENIGN--- AND STABLE PER  CT  11/2013  . Osteoporosis   . PAC (premature atrial contraction)   . PAT (paroxysmal atrial tachycardia) ([place])   . PONV (postoperative nausea and vomiting)   . Seizures ([place])    none in years were due to tuberous sclerosis- per patient  . 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 Vitamin D deficiency     Past Surgical History:  Procedure Laterality Date  . ANTERIOR HIP REVISION Left 10/18/2014   Procedure: EXCHANGE LEFT HIP BALL OF DIRECT ANTERIOR TOTAL HIP ARTHROPLASTY;  Surgeon: Mcarthur Rossetti, MD;  Location: [facility];  Service: Orthopedics;  Laterality: Left;  . AUGMENTATION MAMMAPLASTY     SALINE  . BLADDER SURGERY  1991   bladder reconstruction of torsion  . CARDIAC CATHETERIZATION  06-26-2001   NORMAL CORONARY ARTERIES  . CATARACT EXTRACTION W/ INTRAOCULAR LENS  IMPLANT, BILATERAL    . CHOLECYSTECTOMY  11-18-2003  . CO2 LASER APPLICATION N/A [id number]   Procedure: CO2 LASER APPLICATION;  Surgeon: Terrance Mass, MD;  Location: [facility];  Service: Gynecology;  Laterality: N/A;CO2 laser of vaginal and vulvar lesions  . COLONOSCOPY    . CYSTOSCOPY WITH RETROGRADE PYELOGRAM, URETEROSCOPY AND STENT PLACEMENT Right 11/22/2013   Procedure: CYSTOSCOPY WITH RETROGRADE PYELOGRAM, URETEROSCOPY AND STENT PLACEMENT;  Surgeon: Sharyn Creamer, MD;  Location[facility];  Service: Urology;  Laterality: Right;  . CYSTOSCOPY/RETROGRADE/URETEROSCOPY Right 12/01/2013   Procedure: CYSTOSCOPY,  RIGHT RETROGRADE/RIGHT DIGITAL URETEROSCOPY, RIGHT URETERAL STENT EXCHANGE;  Surgeon: Sharyn Creamer, MD;  Location[facility];  Service: Urology;  Laterality: Right;  STAGED RIGHT URETEROSCOPY RIGHT URETER DILATION    . DILATION AND CURETTAGE OF UTERUS  multiple -- last one 1975  . GYNECOLOGIC CRYOSURGERY    . HEMORRHOID SURGERY  1970's  . HIP CLOSED REDUCTION Left 10/16/2014   Procedure: CLOSED MANIPULATION HIP;  Surgeon: Mcarthur Rossetti, MD;  Location: [facility];  Service: Orthopedics;  Laterality: Left;  . HOLMIUM LASER APPLICATION Right [id number]   Procedure: HOLMIUM LASER APPLICATION;  Surgeon: Sharyn Creamer, MD;  Location: [facility];  Service: Urology;  Laterality: Right;  . KNEE SURGERY Left 11/2016   "scrapped"  . [place] SURGERY  1985  . ORIF RIGHT BIMALLEOLAR ANKLE FX  12-18-2003  . TOTAL HIP ARTHROPLASTY Left 09/16/2014   Procedure: LEFT TOTAL HIP ARTHROPLASTY ANTERIOR APPROACH;  Surgeon: Mcarthur Rossetti, MD;  Location: [facility];  Service: Orthopedics;  Laterality: Left;  .  TOTAL HIP ARTHROPLASTY Right 02/17/2015   Procedure: RIGHT TOTAL HIP ARTHROPLASTY ANTERIOR APPROACH;  Surgeon: Mcarthur Rossetti, MD;  Location: [facility];  Service: Orthopedics;  Laterality: Right;  . UPPER GASTROINTESTINAL ENDOSCOPY    . VAGINAL HYSTERECTOMY  1975  . WIDE LOCAL EXCISION OF FOURCHETTE AND PERINEUM  04-06-2009   VULVAR CARCINOMA IN SITU    No current facility-administered medications for this encounter.    Current Outpatient Medications  Medication Sig Dispense Refill Last Dose  . acetaminophen (TYLENOL) 500 MG tablet Take 1,000 mg by mouth every 6 (six) hours as needed for mild pain or headache.      . folic acid (FOLVITE) 1 MG tablet Take 1 mg by mouth daily.     . furosemide (LASIX) 20 MG tablet TAKE 1 TABLET BY MOUTH DAILY (Patient taking differently: Take 20 mg by mouth daily. ) 90 tablet 0   . HYDROcodone-acetaminophen  (NORCO/VICODIN) 5-325 MG tablet Take 1 tablet by mouth every 6 (six) hours as needed for moderate pain. 40 tablet 0   . KLOR-CON M20 20 MEQ tablet Take 1 tablet (20 mEq total) by mouth daily. 30 tablet 0   . methocarbamol (ROBAXIN) 750 MG tablet Take 750 mg by mouth 2 (two) times daily as needed for muscle spasms.     . metoprolol succinate (TOPROL-XL) 25 MG 24 hr tablet Take 12.5 mg by mouth at bedtime.      . naproxen sodium (ALEVE) 220 MG tablet Take 440 mg by mouth as needed (Pain).      Marland Kitchen omega-3 acid ethyl esters (LOVAZA) 1 g capsule Take 1 g by mouth 2 (two) times daily.     . pantoprazole (PROTONIX) 40 MG tablet Take 40 mg by mouth 2 (two) times a day.     . Turmeric 500 MG CAPS Take 500 mg by mouth daily.     . valACYclovir (VALTREX) 1000 MG tablet Take 1,000 mg by mouth as needed. Take 500mg  twice a day for shingles outbreaks as needed     . Vitamin D, Ergocalciferol, (DRISDOL) 1.25 MG (50000 UT) CAPS capsule Take 50,000 Units by mouth every Sunday.      Allergies  Allergen Reactions  . Other     All anti-depressants pt states makes her feel as if her body is shutting down.  . Sulfa Antibiotics Rash, Hives and Nausea Only  . Zoloft [Sertraline Hcl]     Pt can not take SSRI - nausea and rash   . Oxycodone Nausea And Vomiting  . Contrast Media [Iodinated Diagnostic Agents] Hives and Rash    Patient breaks out in hives    Social History   Tobacco Use  . Smoking status: Former Smoker    Packs/day: 2.00    Years: 25.00    Pack years: 50.00    Types: Cigarettes    Quit date: 06/03/1998    Years since quitting: 20.8  . Smokeless tobacco: Never Used  Substance Use Topics  . Alcohol use: Yes    Comment: rarely has a glass of wine    Family History  Problem Relation Age of Onset  . Heart disease Mother   . Tuberous sclerosis Mother   . Uterine cancer Mother   . Tuberous sclerosis Maternal Grandmother   . Tuberous sclerosis Sister   . Tuberous sclerosis Daughter   . Tuberous  sclerosis Son        2 sons  . Tuberous sclerosis Maternal Aunt   . Tuberous sclerosis Maternal Aunt   .  Tuberous sclerosis Maternal Aunt   . Tuberous sclerosis Cousin       Review of Systems  Musculoskeletal: Positive for back pain, joint pain and myalgias.  All other systems reviewed and are negative.   Objective:  Physical Exam  Constitutional: She is oriented to person, place, and time. She appears well-developed and well-nourished.  HENT:  Head: Normocephalic and atraumatic.  Eyes: Pupils are equal, round, and reactive to light. EOM are normal.  Neck: Normal range of motion. Neck supple.  Cardiovascular: Normal rate.  Respiratory: Effort normal.  GI: Soft.  Musculoskeletal:     Left hip: She exhibits decreased strength, tenderness and bony tenderness.  Neurological: She is alert and oriented to person, place, and time.  Skin: Skin is warm and dry.  Psychiatric: She has a normal mood and affect.    Vital signs in last 24 hours:     Labs:   Estimated body mass index is 32.59 kg/m as calculated from the following:   Height as of 03/09/19: 5\' 3"  (1.6 m).   Weight as of 03/09/19: 83.5 kg.  Imaging Review:  Plain radiographs demonstrate loosening of the left hip femoral component   Assessment/Plan:   left hip(s) with failed previous arthroplasty.  The patient history, physical examination, clinical judgement of the provider and imaging studies are consistent with loosening of the left hip(s), previous total hip arthroplasty. Revision total hip arthroplasty is deemed medically necessary. The treatment options including medical management, injection therapy, arthroscopy and arthroplasty were discussed at length. The risks and benefits of total hip revision arthroplasty were presented and reviewed. The risks due to aseptic loosening, infection, stiffness, dislocation/subluxation,  thromboembolic complications and other imponderables were discussed.  The patient  acknowledged the explanation, agreed to proceed with the plan and consent was signed. Patient is being admitted for inpatient treatment for surgery, pain control, PT, OT, prophylactic antibiotics, VTE prophylaxis, progressive ambulation and ADL's and discharge planning. The patient is planning to be discharged home with home health services

## 2019-03-20 LAB — CBC
HCT: 37 % (ref 36.0–46.0)
Hemoglobin: 11.6 g/dL — ABNORMAL LOW (ref 12.0–15.0)
MCH: 28.1 pg (ref 26.0–34.0)
MCHC: 31.4 g/dL (ref 30.0–36.0)
MCV: 89.6 fL (ref 80.0–100.0)
Platelets: 190 10*3/uL (ref 150–400)
RBC: 4.13 MIL/uL (ref 3.87–5.11)
RDW: 14.2 % (ref 11.5–15.5)
WBC: 7.8 10*3/uL (ref 4.0–10.5)
nRBC: 0 % (ref 0.0–0.2)

## 2019-03-20 LAB — BASIC METABOLIC PANEL
Anion gap: 10 (ref 5–15)
BUN: 9 mg/dL (ref 8–23)
CO2: 26 mmol/L (ref 22–32)
Calcium: 8.4 mg/dL — ABNORMAL LOW (ref 8.9–10.3)
Chloride: 100 mmol/L (ref 98–111)
Creatinine, Ser: 0.62 mg/dL (ref 0.44–1.00)
GFR calc Af Amer: >60 mL/min (ref >60)
GFR calc non Af Amer: >60 mL/min (ref >60)
Glucose, Bld: 134 mg/dL — ABNORMAL HIGH (ref 70–99)
Potassium: 3.5 mmol/L (ref 3.5–5.1)
Sodium: 136 mmol/L (ref 135–145)

## 2019-03-20 MED ORDER — CHLORHEXIDINE GLUCONATE CLOTH 2 % EX PADS: 6.0000 | MEDICATED_PAD | Freq: Every day | CUTANEOUS | Status: DC

## 2019-03-20 MED ORDER — HYDROCODONE-ACETAMINOPHEN 7.5-325 MG PO TABS
1.0000 | ORAL_TABLET | Freq: Four times a day (QID) | ORAL | Status: DC | PRN
  Administered 2019-03-20 – 2019-03-22 (×4): 2 via ORAL
  Filled 2019-03-20 (×4): qty 2

## 2019-03-20 NOTE — Progress Notes (Signed)
Subjective: 1 Day Post-Op Procedure(s) (LRB): REVISION LEFT TOTAL HIP ARTHROPLASTY (Left) Patient reports pain as moderate.  Tolerated surgery very well yesterday.  H/H stable this am and vitals stable   Objective: Vital signs in last 24 hours: Temp:  [97.5 F (36.4 C)-99.7 F (37.6 C)] 99.7 F (37.6 C) (10/17 0851) Pulse Rate:  [75-100] 82 (10/17 0851) Resp:  [10-22] 22 (10/17 0851) BP: (129-171)/(56-101) 138/56 (10/17 0851) SpO2:  [96 %-100 %] 100 % (10/17 0851)  Intake/Output from previous day: 10/16 0701 - 10/17 0700 In: 3358.2 [I.V.:3121.6; IV Piggyback:236.6] Out: 1050 [Urine:700; Blood:350] Intake/Output this shift: Total I/O In: -  Out: 650 [Urine:650]  Recent Labs    03/20/19 0510  HGB 11.6*   Recent Labs    03/20/19 0510  WBC 7.8  RBC 4.13  HCT 37.0  PLT 190   Recent Labs    03/20/19 0510  NA 136  K 3.5  CL 100  CO2 26  BUN 9  CREATININE 0.62  GLUCOSE 134*  CALCIUM 8.4*   No results for input(s): LABPT, INR in the last 72 hours.  Sensation intact distally Intact pulses distally Dorsiflexion/Plantar flexion intact Incision: dressing C/D/I   Assessment/Plan: 1 Day Post-Op Procedure(s) (LRB): REVISION LEFT TOTAL HIP ARTHROPLASTY (Left) Up with therapy Discharge home with home health likely Monday afternoon       Mcarthur Rossetti 03/20/2019, 9:00 AM

## 2019-03-20 NOTE — Progress Notes (Signed)
Physical Therapy Evaluation Patient Details Name: [name] MRN: [id number] DOB: 1946-04-16 Today's Date: 03/20/2019   History of Present Illness  Pt is a 73 year old female s/p left THA revision and hx of bil THAs, bil ankle fxs, DVT  Clinical Impression  Patient is s/p above surgery resulting in functional limitations due to the deficits listed below (see PT Problem List).  Patient will benefit from skilled PT to increase their independence and safety with mobility to allow discharge to the venue listed below.   Pt assisted with ambulating in hallway POD #1 and plans to d/c home likely on Monday.     Follow Up Recommendations Home health PT;Follow surgeon's recommendation for DC plan and follow-up therapies    Equipment Recommendations  Rolling walker with 5" wheels;3in1 (PT)    Recommendations for Other Services       Precautions / Restrictions Precautions Precautions: Fall Restrictions LLE Weight Bearing: Weight bearing as tolerated      Mobility  Bed Mobility               General bed mobility comments: pt in recliner on arrival  Transfers Overall transfer level: Needs assistance Equipment used: Rolling walker (2 wheeled) Transfers: Sit to/from Stand Sit to Stand: Min guard         General transfer comment: increased time and effort, cues for UE and LE positioning  Ambulation/Gait Ambulation/Gait assistance: Min guard Gait Distance (Feet): 100 Feet Assistive device: Rolling walker (2 wheeled) Gait Pattern/deviations: Step-to pattern;Antalgic;Decreased stance time - left     General Gait Details: verbal cues for sequence, RW positioning, posture, step length  Stairs            Wheelchair Mobility    Modified Rankin (Stroke Patients Only)       Balance                                             Pertinent Vitals/Pain Pain Assessment: 0-10 Pain Score: 6  Pain Location: L hip/thigh Pain Descriptors /  Indicators: Sore;Aching Pain Intervention(s): Monitored during session;Repositioned;Premedicated before session;Limited activity within patient's tolerance    Home Living Family/patient expects to be discharged to:: Private residence Living Arrangements: Children   Type of Home: House Home Access: Stairs to enter Entrance Stairs-Rails: None Technical brewer of Steps: 1 Home Layout: Able to live on main level with bedroom/bathroom Home Equipment: None      Prior Function Level of Independence: Independent               Hand Dominance        Extremity/Trunk Assessment        Lower Extremity Assessment Lower Extremity Assessment: Generalized weakness;LLE deficits/detail(pt reports generally weak LEs prior to surgery) LLE Deficits / Details: anticipated post op hip weakness, pt able to perform ankle pumps       Communication   Communication: No difficulties  Cognition Arousal/Alertness: Awake/alert Behavior During Therapy: WFL for tasks assessed/performed Overall Cognitive Status: Within Functional Limits for tasks assessed                                        General Comments      Exercises     Assessment/Plan    PT Assessment Patient needs continued PT services  PT Problem List Decreased strength;Decreased range of motion;Decreased mobility;Decreased knowledge of use of DME;Pain       PT Treatment Interventions Stair training;Gait training;Balance training;DME instruction;Therapeutic exercise;Functional mobility training;Therapeutic activities;Patient/family education    PT Goals (Current goals can be found in the Care Plan section)  Acute Rehab PT Goals PT Goal Formulation: With patient Time For Goal Achievement: 03/27/19 Potential to Achieve Goals: Good    Frequency 7X/week   Barriers to discharge        Co-evaluation               AM-PAC PT "6 Clicks" Mobility  Outcome Measure Help needed turning from your  back to your side while in a flat bed without using bedrails?: A Little Help needed moving from lying on your back to sitting on the side of a flat bed without using bedrails?: A Little Help needed moving to and from a bed to a chair (including a wheelchair)?: A Little Help needed standing up from a chair using your arms (e.g., wheelchair or bedside chair)?: A Little Help needed to walk in hospital room?: A Little Help needed climbing 3-5 steps with a railing? : A Little 6 Click Score: 18    End of Session Equipment Utilized During Treatment: Gait belt Activity Tolerance: Patient tolerated treatment well Patient left: in chair;with call bell/phone within reach;with chair alarm set Nurse Communication: Mobility status PT Visit Diagnosis: Other abnormalities of gait and mobility (R26.89)    Time: [id number] PT Time Calculation (min) (ACUTE ONLY): 21 min   Charges:   PT Evaluation $PT Eval Low Complexity: [place], PT, DPT Acute Rehabilitation Services Office: [phone] Pager: [phone]   Trena Platt 03/20/2019, 12:09 PM

## 2019-03-20 NOTE — Progress Notes (Signed)
Physical Therapy Treatment Patient Details Name: [name] MRN: [id number] DOB: May 27, 1946 Today's Date: 03/20/2019    History of Present Illness Pt is a 73 year old female s/p left THA revision and hx of bil THAs, bil ankle fxs, DVT    PT Comments    Pt ambulated in hallway again and performed LE exercises.    Follow Up Recommendations  Home health PT;Follow surgeon's recommendation for DC plan and follow-up therapies     Equipment Recommendations  Rolling walker with 5" wheels;3in1 (PT)    Recommendations for Other Services       Precautions / Restrictions Precautions Precautions: Fall Restrictions LLE Weight Bearing: Weight bearing as tolerated    Mobility  Bed Mobility               General bed mobility comments: pt in recliner on arrival  Transfers Overall transfer level: Needs assistance Equipment used: Rolling walker (2 wheeled) Transfers: Sit to/from Stand Sit to Stand: Min guard         General transfer comment: increased time and effort, cues for UE and LE positioning  Ambulation/Gait Ambulation/Gait assistance: Min guard Gait Distance (Feet): 160 Feet Assistive device: Rolling walker (2 wheeled) Gait Pattern/deviations: Step-to pattern;Antalgic;Decreased stance time - left     General Gait Details: verbal cues for RW positioning, posture, step length   Stairs             Wheelchair Mobility    Modified Rankin (Stroke Patients Only)       Balance                                            Cognition Arousal/Alertness: Awake/alert Behavior During Therapy: WFL for tasks assessed/performed Overall Cognitive Status: Within Functional Limits for tasks assessed                                        Exercises Total Joint Exercises Ankle Circles/Pumps: AROM;Both;10 reps Quad Sets: AROM;Both;10 reps Short Arc Quad: AROM;Left;10 reps Heel Slides: AAROM;Left;10 reps Hip  ABduction/ADduction: AAROM;Left;10 reps Long Arc Quad: AROM;Left;10 reps;Seated    General Comments        Pertinent Vitals/Pain Pain Assessment: 0-10 Pain Score: 6  Pain Location: L hip/thigh Pain Descriptors / Indicators: Sore;Aching Pain Intervention(s): Repositioned;Monitored during session;Premedicated before session    Home Living                      Prior Function            PT Goals (current goals can now be found in the care plan section) Progress towards PT goals: Progressing toward goals    Frequency    7X/week      PT Plan Current plan remains appropriate    Co-evaluation              AM-PAC PT "6 Clicks" Mobility   Outcome Measure  Help needed turning from your back to your side while in a flat bed without using bedrails?: A Little Help needed moving from lying on your back to sitting on the side of a flat bed without using bedrails?: A Little Help needed moving to and from a bed to a chair (including a wheelchair)?: A Little Help needed standing up from a  chair using your arms (e.g., wheelchair or bedside chair)?: A Little Help needed to walk in hospital room?: A Little Help needed climbing 3-5 steps with a railing? : A Little 6 Click Score: 18    End of Session   Activity Tolerance: Patient tolerated treatment well Patient left: in chair;with call bell/phone within reach;with chair alarm set;with family/visitor present Nurse Communication: Mobility status PT Visit Diagnosis: Other abnormalities of gait and mobility (R26.89)     Time: [id number] PT Time Calculation (min) (ACUTE ONLY): 23 min  Charges:  $Gait Training: 8-22 mins $Therapeutic Exercise: 8-22 mins                     Carmelia Bake, PT, DPT Acute Rehabilitation Services Office: [phone] Pager: [phone] Trena Platt 03/20/2019, 3:29 PM

## 2019-03-21 MED ORDER — ASPIRIN 81 MG PO CHEW: 81.0000 mg | CHEWABLE_TABLET | Freq: Two times a day (BID) | ORAL | 0 refills | Status: AC

## 2019-03-21 MED ORDER — METHOCARBAMOL 750 MG PO TABS: 750.0000 mg | ORAL_TABLET | Freq: Two times a day (BID) | ORAL | 1 refills | Status: AC | PRN

## 2019-03-21 MED ORDER — HYDROCODONE-ACETAMINOPHEN 7.5-325 MG PO TABS: 1.0000 | ORAL_TABLET | Freq: Four times a day (QID) | ORAL | 0 refills | Status: DC | PRN

## 2019-03-21 NOTE — Discharge Instructions (Signed)

## 2019-03-21 NOTE — Progress Notes (Signed)
PT Cancellation Note  Patient Details Name: [name] MRN: [id number] DOB: 04-26-1946   Cancelled Treatment:    Reason Eval/Treat Not Completed: Pain limiting ability to participate;Fatigue/lethargy limiting ability to participate. Pt politely declined stating she was hurting and very tired.    [facility] 03/21/2019, 2:30 PM

## 2019-03-21 NOTE — Progress Notes (Signed)
Subjective: 2 Days Post-Op Procedure(s) (LRB): REVISION LEFT TOTAL HIP ARTHROPLASTY (Left) Patient reports pain as moderate.  No complaints otherwise. Progressing with PT.   Objective: Vital signs in last 24 hours: Temp:  [97.7 F (36.5 C)-99.2 F (37.3 C)] 99.2 F (37.3 C) (10/18 0554) Pulse Rate:  [87-100] 100 (10/18 0554) Resp:  [16-25] 18 (10/18 0554) BP: (121-142)/(45-64) 142/64 (10/18 0554) SpO2:  [93 %-98 %] 94 % (10/18 0554)  Intake/Output from previous day: 10/17 0701 - 10/18 0700 In: 1394 [P.O.:50; I.V.:1344] Out: 652 [Urine:652] Intake/Output this shift: Total I/O In: -  Out: 1 [Urine:1]  Recent Labs    03/20/19 0510  HGB 11.6*   Recent Labs    03/20/19 0510  WBC 7.8  RBC 4.13  HCT 37.0  PLT 190   Recent Labs    03/20/19 0510  NA 136  K 3.5  CL 100  CO2 26  BUN 9  CREATININE 0.62  GLUCOSE 134*  CALCIUM 8.4*   No results for input(s): LABPT, INR in the last 72 hours.  Left lower extremity: Dorsiflexion/Plantar flexion intact Incision: dressing C/D/I Compartment soft   Assessment/Plan: 2 Days Post-Op Procedure(s) (LRB): REVISION LEFT TOTAL HIP ARTHROPLASTY (Left) Up with therapy  Discharge to home tomorrow when family available. Patient will also benefit from additional PT prior to discharge.       GILBERT CLARK 03/21/2019, 12:02 PM

## 2019-03-21 NOTE — Progress Notes (Signed)
Physical Therapy Treatment Patient Details Name: [name] MRN: [id number] DOB: 03-07-1946 Today's Date: 03/21/2019    History of Present Illness Pt is a 73 year old female s/p left THA revision and hx of bil THAs, bil ankle fxs, DVT    PT Comments    Pt progressing well. incr gait distance/tolerance today. C/o pain and stiffness L hip which improved with mobility. Pt is very motivated. Continue PT POC  Follow Up Recommendations  Home health PT;Follow surgeon's recommendation for DC plan and follow-up therapies     Equipment Recommendations  Rolling walker with 5" wheels;3in1 (PT)    Recommendations for Other Services       Precautions / Restrictions Precautions Precautions: Fall Restrictions Weight Bearing Restrictions: No RLE Weight Bearing: Weight bearing as tolerated LLE Weight Bearing: Weight bearing as tolerated    Mobility  Bed Mobility               General bed mobility comments: pt in recliner on arrival  Transfers Overall transfer level: Needs assistance Equipment used: Rolling walker (2 wheeled) Transfers: Sit to/from Stand Sit to Stand: Supervision         General transfer comment: cues for hand placement   Ambulation/Gait Ambulation/Gait assistance: Supervision;Min guard Gait Distance (Feet): 200 Feet Assistive device: Rolling walker (2 wheeled) Gait Pattern/deviations: Step-to pattern;Decreased stance time - left;Decreased weight shift to left;Step-through pattern     General Gait Details: verbal cues for RW positioning, posture, step length, pt beginning step through    [employer] Mobility    Modified Rankin (Stroke Patients Only)       Balance                                            Cognition Arousal/Alertness: Awake/alert Behavior During Therapy: WFL for tasks assessed/performed Overall Cognitive Status: Within Functional Limits for tasks assessed                                        Exercises Total Joint Exercises Ankle Circles/Pumps: AROM;Both;10 reps Quad Sets: AROM;Both;10 reps Heel Slides: AAROM;Left;10 reps Hip ABduction/ADduction: AAROM;Left;10 reps Long Arc Quad: AROM;Left;10 reps;Seated    General Comments        Pertinent Vitals/Pain Pain Assessment: 0-10 Pain Score: 3  Pain Location: L hip/thigh Pain Descriptors / Indicators: Sore;Tightness Pain Intervention(s): Limited activity within patient's tolerance;Monitored during session;Premedicated before session;Repositioned    Home Living                      Prior Function            PT Goals (current goals can now be found in the care plan section) Acute Rehab PT Goals PT Goal Formulation: With patient Time For Goal Achievement: 03/27/19 Potential to Achieve Goals: Good Progress towards PT goals: Progressing toward goals    Frequency    7X/week      PT Plan Current plan remains appropriate    Co-evaluation              AM-PAC PT "6 Clicks" Mobility   Outcome Measure  Help needed turning from your back to your side while in a flat bed without using bedrails?: A  Little Help needed moving from lying on your back to sitting on the side of a flat bed without using bedrails?: A Little Help needed moving to and from a bed to a chair (including a wheelchair)?: A Little Help needed standing up from a chair using your arms (e.g., wheelchair or bedside chair)?: A Little Help needed to walk in hospital room?: A Little Help needed climbing 3-5 steps with a railing? : A Little 6 Click Score: 18    End of Session Equipment Utilized During Treatment: Gait belt Activity Tolerance: Patient tolerated treatment well Patient left: in chair;with call bell/phone within reach;with chair alarm set Nurse Communication: Mobility status PT Visit Diagnosis: Other abnormalities of gait and mobility (R26.89)     Time: [id number] PT Time Calculation (min)  (ACUTE ONLY): 20 min  Charges:  $Gait Training: 8-22 mins                     Kenyon Ana, PT  Pager: [phone] Acute Rehab Dept [facility]): [id number]   03/21/2019    [facility] 03/21/2019, 11:49 AM

## 2019-03-22 ENCOUNTER — Encounter (HOSPITAL_COMMUNITY): Payer: Self-pay | Admitting: Orthopaedic Surgery

## 2019-03-22 MED ORDER — VALACYCLOVIR HCL 1 G PO TABS: 1000.0000 mg | ORAL_TABLET | ORAL | 1 refills | Status: DC | PRN

## 2019-03-22 NOTE — Discharge Summary (Signed)
Patient ID: [name] MRN: [id number] DOB/AGE: 71-Mar-1947 71 y.o.  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 Arthritis   . Cancer ([place])   . DDD (degenerative disc disease), lumbosacral   . Depression    situational  . Diverticulosis   . DVT (deep venous thrombosis) ([place])    right leg 07/26/18  . Dyspnea    with activity  . Dysrhythmia    atrial Tachycardia  . Falls    hx of   . GERD (gastroesophageal reflux disease)   . H/O hiatal hernia   . History of diverticulitis of colon   . History of epilepsy    childhood age 78 to 8  ---secondary to high calcium deposts of optic nerve---  none since age 43 with pregency  . History of gastric ulcer   . History of kidney stones   . History of shingles    MARCH 2014--  BACK AREA  . History of vulvar dysplasia    S/P  LASER ABLATION 2014  . Hypertension   . IBS (irritable bowel syndrome)   . Mild obstructive sleep apnea    SLEEP STUDY  11-19-2012--  NO CPAP RX (DID NOT MEET [facility])  . Nodule of right lung    BENIGN--- AND STABLE PER  CT  11/2013  . Osteoporosis   . PAC (premature atrial contraction)   . PAT (paroxysmal atrial tachycardia) ([place])   . PONV (postoperative nausea and vomiting)   . Seizures ([place])    none in years were due to tuberous sclerosis- per patient  . 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 Vitamin D deficiency     Surgeries: Procedure(s): REVISION LEFT TOTAL HIP ARTHROPLASTY on 03/19/2019   Consultants:   Discharged Condition: Improved  Hospital Course: [name] is an 73 y.o. female who was admitted 03/19/2019 for operative treatment ofFemoral loosening of prosthetic left hip  ([place]). Patient has severe unremitting pain that affects sleep, daily activities, and work/hobbies. After pre-op clearance the patient was taken to the operating room on 03/19/2019 and underwent  Procedure(s): REVISION LEFT TOTAL HIP ARTHROPLASTY.    Patient was given perioperative antibiotics:  Anti-infectives (From admission, onward)   Start     Dose/Rate Route Frequency Ordered Stop   03/22/19 0000  valACYclovir (VALTREX) 1000 MG tablet     1,000 mg Oral As needed 03/22/19 0730     03/19/19 1915  ceFAZolin (ANCEF) IVPB 1 g/50 mL premix     1 g 100 mL/hr over 30 Minutes Intravenous Every 6 hours 03/19/19 1758 03/20/19 0819   03/19/19 0745  ceFAZolin (ANCEF) IVPB 2g/100 mL premix     2 g 200 mL/hr over 30 Minutes Intravenous On call to O.R. 03/19/19 0731 03/19/19 1325       Patient was given sequential compression devices, early ambulation, and chemoprophylaxis to prevent DVT.  Patient benefited maximally from hospital stay and there were no complications.    Recent vital signs:  Patient Vitals for the past 24 hrs:  BP Temp Temp src Pulse Resp SpO2  03/22/19 0413 123/60 98.5 F (36.9 C) Oral 60 (!) 23 96 %  03/21/19 2028 (!) 141/55 99 F (37.2 C) - (!)  101 16 98 %  03/21/19 1313 (!) 129/56 98.4 F (36.9 C) Oral 80 16 98 %     Recent laboratory studies:  Recent Labs    03/20/19 0510  WBC 7.8  HGB 11.6*  HCT 37.0  PLT 190  NA 136  K 3.5  CL 100  CO2 26  BUN 9  CREATININE 0.62  GLUCOSE 134*  CALCIUM 8.4*     Discharge Medications:   Allergies as of 03/22/2019      Reactions   Other    All anti-depressants pt states makes her feel as if her body is shutting down.   Sulfa Antibiotics Rash, Hives, Nausea Only   Zoloft [sertraline Hcl]    Pt can not take SSRI - nausea and rash    Oxycodone Nausea And Vomiting   Contrast Media [iodinated Diagnostic Agents] Hives, Rash   Patient breaks out in hives      Medication List    STOP taking these medications    acetaminophen 500 MG tablet Commonly known as: TYLENOL   HYDROcodone-acetaminophen 5-325 MG tablet Commonly known as: NORCO/VICODIN Replaced by: HYDROcodone-acetaminophen 7.5-325 MG tablet   naproxen sodium 220 MG tablet Commonly known as: ALEVE     TAKE these medications   aspirin 81 MG chewable tablet Chew 1 tablet (81 mg total) by mouth 2 (two) times daily.   folic acid 1 MG tablet Commonly known as: FOLVITE Take 1 mg by mouth daily.   furosemide 20 MG tablet Commonly known as: LASIX TAKE 1 TABLET BY MOUTH DAILY   HYDROcodone-acetaminophen 7.5-325 MG tablet Commonly known as: NORCO Take 1-2 tablets by mouth every 6 (six) hours as needed for moderate pain. Replaces: HYDROcodone-acetaminophen 5-325 MG tablet   Klor-Con M20 20 MEQ tablet Generic drug: potassium chloride SA Take 1 tablet (20 mEq total) by mouth daily.   methocarbamol 750 MG tablet Commonly known as: ROBAXIN Take 1 tablet (750 mg total) by mouth 2 (two) times daily as needed for muscle spasms. Take for muscle spasm. What changed: additional instructions   metoprolol succinate 25 MG 24 hr tablet Commonly known as: TOPROL-XL Take 12.5 mg by mouth at bedtime.   omega-3 acid ethyl esters 1 g capsule Commonly known as: LOVAZA Take 1 g by mouth 2 (two) times daily.   pantoprazole 40 MG tablet Commonly known as: PROTONIX Take 40 mg by mouth 2 (two) times a day.   Turmeric 500 MG Caps Take 500 mg by mouth daily.   valACYclovir 1000 MG tablet Commonly known as: VALTREX Take 1 tablet (1,000 mg total) by mouth as needed. Take 500mg  twice a day for shingles outbreaks as needed   Vitamin D (Ergocalciferol) 1.25 MG (50000 UT) Caps capsule Commonly known as: DRISDOL Take 50,000 Units by mouth every Sunday.            Durable Medical Equipment  (From admission, onward)         Start     Ordered   03/19/19 1759  DME 3 n 1  Once     10 /16/20 1758   03/19/19 1759  DME Walker rolling  Once     Question:  Patient needs a walker to treat with the following condition  Answer:  S/P revision of total hip   03/19/19 1758          Diagnostic Studies: Dg Pelvis Portable  Result Date: 03/19/2019 CLINICAL DATA:  Left total hip replacement postop film. EXAM: PORTABLE PELVIS 1-2 VIEWS COMPARISON:  Intraoperative images  of 03/19/2019 FINDINGS: Signs of bilateral total hip arthroplasty with recent revision of left hip arthroplasty. Gas in soft tissues and skin staples overlie the left hip and proximal femur. The greater trochanter appears somewhat lucent is difficult to evaluate due to overlying gas in the soft tissues. No signs of periprosthetic fracture within the limitations of the current exam. IMPRESSION: Post revision of left total hip arthroplasty. Lucency over greater trochanter likely relates to gas in soft tissues, attention this area on follow-up is suggested. Electronically Signed   By: Zetta Bills M.D.   On: 03/19/2019 16:39   Dg C-arm 1-60 Min-no Report  Result Date: 03/19/2019 Fluoroscopy was utilized by the requesting physician.  No radiographic interpretation.   Dg Hip Operative Unilat W Or W/o Pelvis Left  Result Date: 03/19/2019 CLINICAL DATA:  Left hip replacement EXAM: OPERATIVE LEFT HIP (WITH PELVIS IF PERFORMED) 8 VIEWS TECHNIQUE: Fluoroscopic spot image(s) were submitted for interpretation post-operatively. COMPARISON:  Hip radiographs dated 03/11/2018. FINDINGS: Bilateral pleural hip arthroplasties are noted. Gas overlies the operative site on the left. There is no evidence of hardware failure or dislocation. IMPRESSION: 1. Bilateral hip arthroplasties. No evidence of hardware failure or dislocation. 2. See operative report for further details. Electronically Signed   By: Zerita Boers M.D.   On: 03/19/2019 15:28    Disposition: Discharge disposition: 01-Home or Self Care         Follow-up Information    Mcarthur Rossetti, MD. Schedule an appointment  as soon as possible for a visit in 2 week(s).   Specialty: Orthopedic Surgery Contact information: [place] [address] [phone]            Signed: Mcarthur Rossetti 03/22/2019, 7:31 AM

## 2019-03-22 NOTE — Progress Notes (Signed)
Physical Therapy Treatment Patient Details Name: [name] MRN: [id number] DOB: 1945-06-22 Today's Date: 03/22/2019    History of Present Illness Pt is a 73 year old female s/p left THA revision and hx of bil THAs, bil ankle fxs, DVT    PT Comments    Progressing well with mobility. All education completed.    Follow Up Recommendations  Home health PT;Follow surgeon's recommendation for DC plan and follow-up therapies     Equipment Recommendations  Rolling walker with 5" wheels;3in1 (PT)    Recommendations for Other Services       Precautions / Restrictions Precautions Precautions: Fall Restrictions Weight Bearing Restrictions: No LLE Weight Bearing: Weight bearing as tolerated    Mobility  Bed Mobility Overal bed mobility: Needs Assistance Bed Mobility: Supine to Sit     Supine to sit: Min assist;HOB elevated     General bed mobility comments: small amount of assist for  L LE  Transfers Overall transfer level: Needs assistance Equipment used: Rolling walker (2 wheeled) Transfers: Sit to/from Stand Sit to Stand: Supervision            Ambulation/Gait Ambulation/Gait assistance: Supervision Gait Distance (Feet): 200 Feet Assistive device: Rolling walker (2 wheeled) Gait Pattern/deviations: Step-to pattern;Step-through pattern;Decreased stride length         Stairs Stairs: (Verbally discussed technique for 1 step negotation)           Wheelchair Mobility    Modified Rankin (Stroke Patients Only)       Balance Overall balance assessment: Mild deficits observed, not formally tested                                          Cognition Arousal/Alertness: Awake/alert Behavior During Therapy: WFL for tasks assessed/performed Overall Cognitive Status: Within Functional Limits for tasks assessed                                        Exercises Total Joint Exercises Ankle Circles/Pumps: AROM;Both;10  reps;Supine Quad Sets: AROM;Both;10 reps;Supine Heel Slides: AAROM;Left;10 reps;Supine Hip ABduction/ADduction: AAROM;Left;10 reps;Supine    General Comments        Pertinent Vitals/Pain Pain Assessment: 0-10 Pain Score: 3  Pain Location: L hip/thigh Pain Descriptors / Indicators: Sore;Tightness Pain Intervention(s): Monitored during session;Ice applied;Repositioned    Home Living                      Prior Function            PT Goals (current goals can now be found in the care plan section) Progress towards PT goals: Progressing toward goals    Frequency    7X/week      PT Plan Current plan remains appropriate    Co-evaluation              AM-PAC PT "6 Clicks" Mobility   Outcome Measure  Help needed turning from your back to your side while in a flat bed without using bedrails?: A Little Help needed moving from lying on your back to sitting on the side of a flat bed without using bedrails?: A Little Help needed moving to and from a bed to a chair (including a wheelchair)?: A Little Help needed standing up from a chair using your arms (e.g., wheelchair  or bedside chair)?: A Little Help needed to walk in hospital room?: A Little Help needed climbing 3-5 steps with a railing? : A Little 6 Click Score: 18    End of Session   Activity Tolerance: Patient tolerated treatment well Patient left: in chair;with call bell/phone within reach   PT Visit Diagnosis: Other abnormalities of gait and mobility (R26.89)     Time: 0950-1010 PT Time Calculation (min) (ACUTE ONLY): 20 min  Charges:  $Gait Training: 8-22 mins                        Weston Anna, [place] Pager: [phone] Office: [phone]

## 2019-03-22 NOTE — TOC Initial Note (Signed)
Transition of Care [facility]) - Initial/Assessment Note    Patient Details  Name: [name] MRN: [id number] Date of Birth: [birth year]  Transition of [facility]) CM/SW Contact:    Dessa Phi, RN Phone Number: 03/22/2019, 11:16 AM  Clinical Narrative:    D/c home w/HHC/dme-[facility] for HHPT/[employer] to deliver dme to rm prior d/c.               Expected Discharge Plan: [place] Barriers to Discharge: No Barriers Identified   Patient Goals and CMS Choice Patient states their goals for this hospitalization and ongoing recovery are:: go home CMS Medicare.gov Compare Post Acute Care list provided to:: Patient Choice offered to / list presented to : Patient  Expected Discharge Plan and Services Expected Discharge Plan: [place]   Discharge Planning Services: CM Consult Post Acute Care Choice: Home Health, Durable Medical Equipment Living arrangements for the past 2 months: Single Family Home Expected Discharge Date: 03/22/19               DME Arranged: Berta Minor rolling DME Agency: [facility] Date DME Agency Contacted: 03/22/19 Time DME Agency Contacted: [phone] Representative spoke with at DME Agency: [place]: Other - See comment([facility]) Date [place]: 03/22/19 Time [place]: 53 Representative spoke with at [place]: [place] Arrangements/Services Living arrangements for the past 2 months: [place] Lives with:: Spouse Patient language and need for interpreter reviewed:: Yes Do you feel safe going back to the place where you live?: Yes      Need for Family Participation in Patient Care: No (Comment) Care giver support system in place?: Yes (comment)   Criminal Activity/Legal Involvement Pertinent to Current Situation/Hospitalization: No - Comment as needed  Activities of Daily Living Home Assistive Devices/Equipment: Eyeglasses ADL Screening (condition at time  of admission) Patient's cognitive ability adequate to safely complete daily activities?: Yes Is the patient deaf or have difficulty hearing?: Yes Does the patient have difficulty seeing, even when wearing glasses/contacts?: No Does the patient have difficulty concentrating, remembering, or making decisions?: No Patient able to express need for assistance with ADLs?: Yes Does the patient have difficulty dressing or bathing?: No Independently performs ADLs?: Yes (appropriate for developmental age) Does the patient have difficulty walking or climbing stairs?: Yes Weakness of Legs: Left Weakness of Arms/Hands: None  Permission Sought/Granted Permission sought to share information with : Case Manager Permission granted to share information with : Yes, Verbal Permission Granted  Share Information with NAME: Dessa Phi [facility]  Permission granted to share info w AGENCY: [place] granted to share info w Relationship: Case manager  Permission granted to share info w Contact Information: [phone]  Emotional Assessment Appearance:: Appears stated age Attitude/Demeanor/Rapport: Gracious Affect (typically observed): Accepting Orientation: : Oriented to Self, Oriented to Place, Oriented to  Time, Oriented to Situation Alcohol / Substance Use: Not Applicable Psych Involvement: No (comment)  Admission diagnosis:  Loose Left Hip Prosthesis Patient Active Problem List   Diagnosis Date Noted  . Femoral loosening of prosthetic left hip ([place]) 03/19/2019  . S/P revision of total hip 03/19/2019  . PAT (paroxysmal atrial tachycardia) ([place])   . PAC (premature atrial contraction)   . Impingement syndrome of right shoulder 02/10/2018  . Chronic right shoulder pain 08/05/2017  . Chronic left shoulder pain 08/05/2017  . Chronic pain of right knee 08/05/2017  . Chronic pain of  both shoulders 06/10/2017  . Chronic pain of left knee 06/10/2017  . Status post arthroscopy of left knee  10/24/2016  . Acute pyelonephritis 08/18/2016  . Chest pressure 08/18/2016  . Essential hypertension 08/18/2016  . Hyponatremia 08/18/2016  . Diastolic dysfunction 0000000  . AKI (acute kidney injury) ([place]) 08/18/2016  . Sepsis ([place])   . Acute lower UTI   . Tachycardia   . Osteoarthritis of right hip 02/17/2015  . 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 Atrial tachycardia ([place]) 12/12/2014  . 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 Recurrent dislocation of hip 10/17/2014  . Dislocation of internal left hip prosthesis ([place]) 10/16/2014  . Ectopic atrial rhythm 10/12/2014  . Atrial bigeminy 10/12/2014  . Osteoarthritis of left hip 09/16/2014  . Status post total replacement of left hip 09/16/2014  . ASCUS favoring benign 09/05/2014  . History of vitamin B deficiency 05/02/2014  . Acute bronchitis 04/18/2014  . Oral aphthous ulcer 04/18/2014  . Edema 12/21/2013  . Irregular heart beat 12/21/2013  . Anuria 11/12/2013  . History of cervical dysplasia 09/21/2013  . Rectocele, female 09/21/2013  . Kidney stone 09/21/2013  . Rash and nonspecific skin eruption 06/25/2013  . Obesity (BMI 30-39.9) 06/16/2013  . IBS (irritable bowel syndrome) 12/22/2012  . Other sleep disturbances 10/27/2012  . Elevated BP 09/29/2012  . Weight gain 08/17/2012  . Osteopenia 08/17/2012  . Vulvar lesion 08/10/2012  . 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 Condyloma acuminatum in female 07/14/2012  . Shingles 07/14/2012  . Vitamin D deficiency 07/14/2012   PCP:  Larence Penning, MD Pharmacy:   [facility] [place]  [id number] Phone: [phone] Fax: [phone]     Social Determinants of Health (SDOH) Interventions    Readmission Risk Interventions No flowsheet data found.

## 2019-03-22 NOTE — Care Management Important Message (Signed)
Important Message  Patient Details IM Letter given to Dessa Phi RN to present to the Patient Name: [name] MRN: [id number] Date of Birth: [birth year]   Medicare Important Message Given:  Yes     Kerin Salen 03/22/2019, 11:57 AM

## 2019-03-22 NOTE — Progress Notes (Signed)
Patient ID: [name], female   DOB: [birth year], 73 y.o.   MRN: [id number] Doing well overall.  Can be discharged to home today.  Left hip stable and vitals stable.

## 2019-04-01 ENCOUNTER — Inpatient Hospital Stay: Payer: Medicare Other | Admitting: Physician Assistant

## 2019-04-05 ENCOUNTER — Other Ambulatory Visit: Payer: Self-pay

## 2019-04-05 ENCOUNTER — Encounter: Payer: Self-pay | Admitting: Orthopaedic Surgery

## 2019-04-05 ENCOUNTER — Ambulatory Visit (INDEPENDENT_AMBULATORY_CARE_PROVIDER_SITE_OTHER): Payer: Medicare Other | Admitting: Orthopaedic Surgery

## 2019-04-05 DIAGNOSIS — Z96642 Presence of left artificial hip joint: Secondary | ICD-10-CM | POA: Insufficient documentation

## 2019-04-05 MED ORDER — DOXYCYCLINE HYCLATE 100 MG PO TABS: 100.0000 mg | ORAL_TABLET | Freq: Two times a day (BID) | ORAL | 0 refills | Status: AC

## 2019-04-05 MED ORDER — HYDROCODONE-ACETAMINOPHEN 7.5-325 MG PO TABS: 1.0000 | ORAL_TABLET | Freq: Four times a day (QID) | ORAL | 0 refills | Status: AC | PRN

## 2019-04-05 NOTE — Progress Notes (Signed)
The patient is just over 2-week status post revision arthroplasty of a left hip replacement.  There was aseptic loosening of the femoral component.  We were able to revise this to a long fully porous-coated stem through an anterior approach.  This is her first follow-up appointment.  She says her pain has been less and her range of motion and strength have improved.  She is a large individual.  On exam she does have a large seroma of her left hip with no evidence of infection.  I was able to aspirate probably at least 300 cc of fluid off of the hip area.  She seems to be doing well otherwise and is ambulating well.  I would like to continue her pain medication and will start on doxycycline given the degree of her seroma.  I would like to see her back in just 1 week for likely repeat aspiration.  I did place a compressive dressing around this today.  Her staples were removed and Steri-Strips placed.  All question concerns were answered and addressed.

## 2019-04-06 ENCOUNTER — Telehealth: Payer: Self-pay | Admitting: Orthopaedic Surgery

## 2019-04-06 NOTE — Telephone Encounter (Signed)
Patient called and stated she wanted a return call from you. Wanting to discuss her going back to work  [phone]

## 2019-04-07 NOTE — Telephone Encounter (Signed)
Work Teacher, adult education

## 2019-04-07 NOTE — Telephone Encounter (Signed)
Can you do me a favor and Email the last note I wrote for her to her boss [email or web address] [email or web address]

## 2019-04-12 ENCOUNTER — Encounter: Payer: Self-pay | Admitting: Orthopaedic Surgery

## 2019-04-12 ENCOUNTER — Ambulatory Visit (INDEPENDENT_AMBULATORY_CARE_PROVIDER_SITE_OTHER): Payer: Medicare Other | Admitting: Orthopaedic Surgery

## 2019-04-12 ENCOUNTER — Other Ambulatory Visit: Payer: Self-pay

## 2019-04-12 DIAGNOSIS — Z96642 Presence of left artificial hip joint: Secondary | ICD-10-CM

## 2019-04-12 MED ORDER — TEMAZEPAM 7.5 MG PO CAPS: 7.5000 mg | ORAL_CAPSULE | Freq: Every evening | ORAL | 1 refills | Status: AC | PRN

## 2019-04-12 NOTE — Progress Notes (Signed)
The patient is following up status post revision arthroplasty of her left hip.  She is 3 weeks out from surgery.  When I saw her last week we drained a large seroma of the left hip.  I want to see her back today.  She has had a reaccumulation of the seroma.  I was able to drain more fluid from her hip today but much less than from a week ago.  There was still released 150 cc of fluid.  She is going continue her antibiotics.  At this point she is making progress I will see her back in 2 weeks to see if we need to aspirate her again.  If there is any issues before then she will let us know.  We will try some Restoril for sleeping at night.  She is cleared to drive as well.

## 2019-04-26 ENCOUNTER — Ambulatory Visit: Payer: Medicare Other | Admitting: Orthopaedic Surgery

## 2019-05-03 ENCOUNTER — Encounter: Payer: Self-pay | Admitting: Orthopaedic Surgery

## 2019-05-03 ENCOUNTER — Ambulatory Visit (INDEPENDENT_AMBULATORY_CARE_PROVIDER_SITE_OTHER): Payer: Medicare Other | Admitting: Orthopaedic Surgery

## 2019-05-03 ENCOUNTER — Other Ambulatory Visit: Payer: Self-pay

## 2019-05-03 DIAGNOSIS — Z96642 Presence of left artificial hip joint: Secondary | ICD-10-CM

## 2019-05-03 NOTE — Progress Notes (Signed)
The patient is now 45 days status post revision arthroplasty of a loose femoral component on her left side.  She is walking without assistive device and doing much better.  Twice we did drain large seroma from her hip.  On exam there is no significant seroma today at all.  Is more firm tissue.  I counseled her about massaging this area and trying heat at least twice a day which will help loosen the tissues up.  Overall she is pleased and her pain is much less than it has been.  At this point we will see her back in about 3 months.  At that visit I would like a standing low AP pelvis and lateral of both her hips.

## 2019-05-10 ENCOUNTER — Other Ambulatory Visit: Payer: Self-pay | Admitting: Orthopaedic Surgery

## 2019-05-10 DIAGNOSIS — Z96642 Presence of left artificial hip joint: Secondary | ICD-10-CM

## 2019-06-15 ENCOUNTER — Ambulatory Visit: Payer: Medicare Other | Admitting: Orthopaedic Surgery

## 2019-06-16 ENCOUNTER — Ambulatory Visit (INDEPENDENT_AMBULATORY_CARE_PROVIDER_SITE_OTHER): Payer: Medicare Other

## 2019-06-16 ENCOUNTER — Ambulatory Visit (INDEPENDENT_AMBULATORY_CARE_PROVIDER_SITE_OTHER): Payer: Medicare Other | Admitting: Orthopaedic Surgery

## 2019-06-16 ENCOUNTER — Encounter: Payer: Self-pay | Admitting: Orthopaedic Surgery

## 2019-06-16 ENCOUNTER — Other Ambulatory Visit: Payer: Self-pay

## 2019-06-16 DIAGNOSIS — G8929 Other chronic pain: Secondary | ICD-10-CM | POA: Diagnosis not present

## 2019-06-16 DIAGNOSIS — M25551 Pain in right hip: Secondary | ICD-10-CM

## 2019-06-16 DIAGNOSIS — M25511 Pain in right shoulder: Secondary | ICD-10-CM

## 2019-06-16 DIAGNOSIS — Z96642 Presence of left artificial hip joint: Secondary | ICD-10-CM

## 2019-06-16 DIAGNOSIS — Z96641 Presence of right artificial hip joint: Secondary | ICD-10-CM

## 2019-06-16 MED ORDER — LIDOCAINE HCL 1 % IJ SOLN
3.0000 mL | INTRAMUSCULAR | Status: AC | PRN
  Administered 2019-06-16: 14:00:00 3 mL

## 2019-06-16 MED ORDER — METHYLPREDNISOLONE ACETATE 40 MG/ML IJ SUSP
40.0000 mg | INTRAMUSCULAR | Status: AC | PRN
  Administered 2019-06-16: 40 mg via INTRA_ARTICULAR

## 2019-06-16 NOTE — Progress Notes (Signed)
Office Visit Note   Patient: [name]           Date of Birth: [birth year]           MRN: [id number] Visit Date: 06/16/2019              Requested by: Larence Penning, MD [address] [place],  [place] [address] PCP: Larence Penning, MD   Assessment & Plan: Visit Diagnoses:  1. Chronic right shoulder pain   2. Pain in right hip   3. History of revision of total replacement of left hip joint   4. History of total hip arthroplasty, right     Plan: I did recommend a steroid injection in her right shoulder today to treat her pain.  I do feel that she would benefit from likely shoulder replacement in the future.  She would like to consider maybe an arthroscopic intervention.  With that being said we will see her back in 4 weeks to see how she is done with her shoulder injection to see how she may want to proceed with any other type of intervention.  I did provide a trigger point injection in her right lower back/pelvis area where she is point tender and this did help her.  All question concerns were answered addressed.  We will see her back in 4 weeks with no x-rays are needed.  Follow-Up Instructions: Return in about 4 weeks (around 07/14/2019).   Orders:  Orders Placed This Encounter  Procedures  . Large Joint Inj  . XR Shoulder Right  . XR HIP UNILAT W OR W/O PELVIS 1V RIGHT   No orders of the defined types were placed in this encounter.     Procedures: Large Joint Inj: R subacromial bursa on 06/16/2019 1:31 PM Indications: pain and diagnostic evaluation Details: 22 G 1.5 in needle  Arthrogram: No  Medications: 3 mL lidocaine 1 %; 40 mg methylPREDNISolone acetate 40 MG/ML Outcome: tolerated well, no immediate complications Procedure, treatment alternatives, risks and benefits explained, specific risks discussed. Consent was given by the patient. Immediately prior to procedure a time out was called to verify the correct patient, procedure, equipment, support staff and  site/side marked as required. Patient was prepped and draped in the usual sterile fashion.       Clinical Data: No additional findings.   Subjective: Chief Complaint  Patient presents with  . Right Shoulder - Pain  . Right Hip - Pain  The patient is well-known to [place].  She is now almost 13 weeks status post revision arthroplasty of her left hip.  She has been dealing with right hip pain but this is been more of her low back is where she is pointing to.  She also has known right shoulder pain.  A MRI of her right shoulder in 2018 showed severe tendinitis of the rotator cuff and some moderate glenohumeral arthritic changes and AC joint arthritic changes.  She still hurts with overhead activities and driving in general with using her right shoulder.  She says her right operative hip is now hurting in the groin and her left hip is also doing well which was the one we most recently revised due to aseptic loosening of the femoral component.  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  I agree with this having had bilateral hip surgeries.  HPI  Review of Systems She currently  denies a headache, chest pain, shortness of breath, fever, chills, nausea, vomiting  Objective: Vital Signs: There were no vitals taken for this visit.  Physical Exam She is alert and orient x3 and in no acute distress Ortho Exam Examination of both hips shows good range of motion of both hips with no pain on either hip.  She does have low back pain and sciatic pain to the right side but appears to be more the pelvis and SI joint area and it is not deep.  Examination of her right shoulder shows grinding of the humeral joint with some weakness of the rotator cuff but decent range of motion. Specialty Comments:  No specialty comments available.  Imaging: XR HIP UNILAT W OR W/O PELVIS 1V RIGHT  Result Date: 06/16/2019 An AP pelvis and lateral the right  hip shows both total hip arthroplasties on the AP view with no evidence of any complicating features of either hip even on the lateral view of the right hip.  There is degenerative changes in her lumbar spine at L4-L5 seen on the AP view of the pelvis.  XR Shoulder Right  Result Date: 06/16/2019 3 views of the right shoulder show a well located shoulder with moderate to severe AC joint arthritis and moderate to severe glenohumeral arthritis.  The humeral head is not high riding.    PMFS History: Patient Active Problem List   Diagnosis Date Noted  . History of total hip arthroplasty, right 06/16/2019  . History of revision of total replacement of left hip joint 04/05/2019  . Femoral loosening of prosthetic left hip ([place]) 03/19/2019  . S/P revision of total hip 03/19/2019  . PAT (paroxysmal atrial tachycardia) ([place])   . PAC (premature atrial contraction)   . Impingement syndrome of right shoulder 02/10/2018  . Chronic right shoulder pain 08/05/2017  . Chronic left shoulder pain 08/05/2017  . Chronic pain of right knee 08/05/2017  . Chronic pain of both shoulders 06/10/2017  . Chronic pain of left knee 06/10/2017  . Status post arthroscopy of left knee 10/24/2016  . Acute pyelonephritis 08/18/2016  . Chest pressure 08/18/2016  . Essential hypertension 08/18/2016  . Hyponatremia 08/18/2016  . Diastolic dysfunction 0000000  . AKI (acute kidney injury) ([place]) 08/18/2016  . Sepsis ([place])   . Acute lower UTI   . Tachycardia   . Osteoarthritis of right hip 02/17/2015  . 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 Atrial tachycardia ([place]) 12/12/2014  . 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 Recurrent dislocation of hip 10/17/2014  . Dislocation of internal left hip prosthesis ([place]) 10/16/2014  . Ectopic atrial rhythm 10/12/2014  . Atrial bigeminy 10/12/2014  . Osteoarthritis of left hip 09/16/2014  . Status post total replacement of left hip  09/16/2014  . ASCUS favoring benign 09/05/2014  . History of vitamin B deficiency 05/02/2014  . Acute bronchitis 04/18/2014  . Oral aphthous ulcer 04/18/2014  . Edema 12/21/2013  . Irregular heart beat 12/21/2013  . Anuria 11/12/2013  . History of cervical dysplasia 09/21/2013  . Rectocele, female 09/21/2013  . Kidney stone 09/21/2013  . Rash and nonspecific skin eruption 06/25/2013  . Obesity (BMI 30-39.9) 06/16/2013  . IBS (irritable bowel syndrome) 12/22/2012  . Other sleep disturbances 10/27/2012  . Elevated BP 09/29/2012  . Weight gain 08/17/2012  . Osteopenia 08/17/2012  . Vulvar lesion 08/10/2012  . 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 Condyloma acuminatum in female 07/14/2012  . Shingles 07/14/2012  .  Vitamin D deficiency 07/14/2012   Past Medical History:  Diagnosis Date  . Arthritis   . Cancer ([place])   . DDD (degenerative disc disease), lumbosacral   . Depression    situational  . Diverticulosis   . DVT (deep venous thrombosis) ([place])    right leg 07/26/18  . Dyspnea    with activity  . Dysrhythmia    atrial Tachycardia  . Falls    hx of   . GERD (gastroesophageal reflux disease)   . H/O hiatal hernia   . History of diverticulitis of colon   . History of epilepsy    childhood age 64 to 55  ---secondary to high calcium deposts of optic nerve---  none since age 75 with pregency  . History of gastric ulcer   . History of kidney stones   . History of shingles    MARCH 2014--  BACK AREA  . History of vulvar dysplasia    S/P  LASER ABLATION 2014  . Hypertension   . IBS (irritable bowel syndrome)   . Mild obstructive sleep apnea    SLEEP STUDY  11-19-2012--  NO CPAP RX (DID NOT MEET CRITIRIA)  . Nodule of right lung    BENIGN--- AND STABLE PER  CT  11/2013  . Osteoporosis   . PAC (premature atrial contraction)   . PAT (paroxysmal atrial tachycardia) ([place])   . PONV (postoperative nausea and vomiting)   . Seizures ([place])    none in years were due to  tuberous sclerosis- per patient  . 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 Vitamin D deficiency     Family History  Problem Relation Age of Onset  . Heart disease Mother   . Tuberous sclerosis Mother   . Uterine cancer Mother   . Tuberous sclerosis Maternal Grandmother   . Tuberous sclerosis Sister   . Tuberous sclerosis Daughter   . Tuberous sclerosis Son        2 sons  . Tuberous sclerosis Maternal Aunt   . Tuberous sclerosis Maternal Aunt   . Tuberous sclerosis Maternal Aunt   . Tuberous sclerosis Cousin     Past Surgical History:  Procedure Laterality Date  . ANTERIOR HIP REVISION Left 10/18/2014   Procedure: EXCHANGE LEFT HIP BALL OF DIRECT ANTERIOR TOTAL HIP ARTHROPLASTY;  Surgeon: Mcarthur Rossetti, MD;  Location: [facility];  Service: Orthopedics;  Laterality: Left;  . AUGMENTATION MAMMAPLASTY     SALINE  . BLADDER SURGERY  1991   bladder reconstruction of torsion  . CARDIAC CATHETERIZATION  06-26-2001   NORMAL CORONARY ARTERIES  . CATARACT EXTRACTION W/ INTRAOCULAR LENS  IMPLANT, BILATERAL    . CHOLECYSTECTOMY  11-18-2003  . CO2 LASER APPLICATION N/A [id number]   Procedure: CO2 LASER APPLICATION;  Surgeon: Terrance Mass, MD;  Location: [facility];  Service: Gynecology;  Laterality: N/A;CO2 laser of vaginal and vulvar lesions  . COLONOSCOPY    . CYSTOSCOPY WITH RETROGRADE PYELOGRAM, URETEROSCOPY AND STENT PLACEMENT Right 11/22/2013   Procedure: CYSTOSCOPY WITH RETROGRADE PYELOGRAM, URETEROSCOPY AND STENT PLACEMENT;  Surgeon: Sharyn Creamer, MD;  [facility];  Service: Urology;  Laterality: Right;  . CYSTOSCOPY/RETROGRADE/URETEROSCOPY Right 12/01/2013   Procedure: CYSTOSCOPY, RIGHT RETROGRADE/RIGHT DIGITAL URETEROSCOPY, RIGHT URETERAL STENT EXCHANGE;  Surgeon: Sharyn Creamer, MD;  Location: [facility];  Service: Urology;   Laterality: Right;  STAGED RIGHT URETEROSCOPY RIGHT  URETER DILATION    . DILATION AND CURETTAGE OF UTERUS  multiple -- last one 1975  . GYNECOLOGIC CRYOSURGERY    . HEMORRHOID SURGERY  1970's  . HIP CLOSED REDUCTION Left 10/16/2014   Procedure: CLOSED MANIPULATION HIP;  Surgeon: Mcarthur Rossetti, MD;  Location: [facility];  Service: Orthopedics;  Laterality: Left;  . HOLMIUM LASER APPLICATION Right [id number]   Procedure: HOLMIUM LASER APPLICATION;  Surgeon: Sharyn Creamer, MD;  Location: [facility];  Service: Urology;  Laterality: Right;  . KNEE SURGERY Left 11/2016   "scrapped"  . [place] SURGERY  1985  . ORIF RIGHT BIMALLEOLAR ANKLE FX  12-18-2003  . TOTAL HIP ARTHROPLASTY Left 09/16/2014   Procedure: LEFT TOTAL HIP ARTHROPLASTY ANTERIOR APPROACH;  Surgeon: Mcarthur Rossetti, MD;  Location: [facility];  Service: Orthopedics;  Laterality: Left;  . TOTAL HIP ARTHROPLASTY Right 02/17/2015   Procedure: RIGHT TOTAL HIP ARTHROPLASTY ANTERIOR APPROACH;  Surgeon: Mcarthur Rossetti, MD;  Location: [facility];  Service: Orthopedics;  Laterality: Right;  . TOTAL HIP REVISION Left 03/19/2019   Procedure: REVISION LEFT TOTAL HIP ARTHROPLASTY;  Surgeon: Mcarthur Rossetti, MD;  Location: [facility];  Service: Orthopedics;  Laterality: Left;  . UPPER GASTROINTESTINAL ENDOSCOPY    . VAGINAL HYSTERECTOMY  1975  . WIDE LOCAL EXCISION OF FOURCHETTE AND PERINEUM  04-06-2009   VULVAR CARCINOMA IN SITU   Social History   Occupational History  . Occupation: Education administrator: OTHER    Comment: [employer]  Tobacco Use  . Smoking status: Former Smoker    Packs/day: 2.00    Years: 25.00    Pack years: 50.00    Types: Cigarettes    Quit date: 06/03/1998    Years since quitting: 21.0  . Smokeless tobacco: Never Used  Substance and Sexual Activity  . Alcohol use: Yes    Comment: rarely has a glass of wine  . Drug use: No  . Sexual activity: Not Currently    Partners:  Male

## 2019-07-14 ENCOUNTER — Ambulatory Visit: Payer: Medicare Other | Admitting: Orthopaedic Surgery

## 2019-07-21 ENCOUNTER — Encounter: Payer: Self-pay | Admitting: Orthopaedic Surgery

## 2019-07-21 ENCOUNTER — Other Ambulatory Visit: Payer: Self-pay

## 2019-07-21 ENCOUNTER — Ambulatory Visit (INDEPENDENT_AMBULATORY_CARE_PROVIDER_SITE_OTHER): Payer: Medicare Other | Admitting: Orthopaedic Surgery

## 2019-07-21 DIAGNOSIS — M25511 Pain in right shoulder: Secondary | ICD-10-CM | POA: Diagnosis not present

## 2019-07-21 DIAGNOSIS — G8929 Other chronic pain: Secondary | ICD-10-CM

## 2019-07-21 NOTE — Progress Notes (Signed)
[name] comes in today for follow-up after I injected her right shoulder subacromial space and provided a trigger point injection in her right posterior pelvic area.  Both injections did help but she is experiencing pain again.  At some point she does wish to have an arthroscopic intervention of her right shoulder and we have agreed with this.  She would like to wait until closer to June.  Overall she seems to be doing well.  She is not a diabetic.  She does feel like she would benefit from another intervention in the meantime in her right shoulder and her will right low back area.  I did explain to her that it is definitely too early for injection since it is just been only 4 weeks.  She has had no other acute changes in her medical status and she is back to work.  She does drive a long distance to come see [place].  On exam she gets out of the chair easily.  I can easily move her right shoulder around but it is painful.  Her right and left lower extremities move well.  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  At this point I would like to set her up for an intervention with Dr. Ernestina Patches but wait at least until 4 weeks from now.  At that visit I would like him to provide a steroid injection in her right shoulder subacromial space but also provide an intervention in the right low back versus pelvis area which I believe based on her exam would be the L5-S1 facet joint.  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  Most likely it is an injection there versus the SI joint to the right but I will have Dr. Ernestina Patches evaluate this as well.  He can then get her back to me about 4 to 6 weeks later.

## 2019-08-02 ENCOUNTER — Ambulatory Visit: Payer: Medicare Other | Admitting: Orthopaedic Surgery

## 2019-08-04 ENCOUNTER — Ambulatory Visit: Payer: Medicare Other | Admitting: Physician Assistant

## 2019-08-25 ENCOUNTER — Encounter: Payer: Medicare Other | Admitting: Physical Medicine and Rehabilitation

## 2020-05-02 ENCOUNTER — Other Ambulatory Visit: Payer: Self-pay | Admitting: Orthopaedic Surgery

## 2020-05-02 NOTE — Telephone Encounter (Signed)
Please advise 

## 2020-07-21 ENCOUNTER — Telehealth: Payer: Self-pay

## 2020-07-21 NOTE — Telephone Encounter (Signed)
Returned patients call She just wanted the dates of her hip surgeries

## 2020-07-21 NOTE — Telephone Encounter (Signed)
Patient called she has questions about previous surgeries call back:[phone]

## 2021-03-20 ENCOUNTER — Other Ambulatory Visit: Payer: Self-pay | Admitting: Orthopaedic Surgery
